# Patient Record
Sex: Female | Born: 1975 | Race: Black or African American | Hispanic: No | Marital: Married | Smoking: Former smoker
Health system: Southern US, Community
[De-identification: ages and names within clinical notes are randomized; demographics above are authoritative.]

## PROBLEM LIST (undated history)

## (undated) DIAGNOSIS — I10 Essential (primary) hypertension: Secondary | ICD-10-CM

## (undated) DIAGNOSIS — F419 Anxiety disorder, unspecified: Secondary | ICD-10-CM

## (undated) DIAGNOSIS — Z87442 Personal history of urinary calculi: Secondary | ICD-10-CM

## (undated) DIAGNOSIS — M109 Gout, unspecified: Secondary | ICD-10-CM

## (undated) DIAGNOSIS — F32A Depression, unspecified: Secondary | ICD-10-CM

## (undated) DIAGNOSIS — N200 Calculus of kidney: Secondary | ICD-10-CM

## (undated) DIAGNOSIS — G4726 Circadian rhythm sleep disorder, shift work type: Secondary | ICD-10-CM

## (undated) DIAGNOSIS — F329 Major depressive disorder, single episode, unspecified: Secondary | ICD-10-CM

## (undated) DIAGNOSIS — K219 Gastro-esophageal reflux disease without esophagitis: Secondary | ICD-10-CM

## (undated) DIAGNOSIS — R000 Tachycardia, unspecified: Secondary | ICD-10-CM

## (undated) DIAGNOSIS — E669 Obesity, unspecified: Secondary | ICD-10-CM

## (undated) DIAGNOSIS — R51 Headache: Secondary | ICD-10-CM

## (undated) DIAGNOSIS — E8881 Metabolic syndrome: Secondary | ICD-10-CM

## (undated) DIAGNOSIS — E119 Type 2 diabetes mellitus without complications: Secondary | ICD-10-CM

## (undated) DIAGNOSIS — F5104 Psychophysiologic insomnia: Secondary | ICD-10-CM

## (undated) DIAGNOSIS — M1990 Unspecified osteoarthritis, unspecified site: Secondary | ICD-10-CM

## (undated) DIAGNOSIS — I499 Cardiac arrhythmia, unspecified: Secondary | ICD-10-CM

## (undated) DIAGNOSIS — G459 Transient cerebral ischemic attack, unspecified: Secondary | ICD-10-CM

## (undated) HISTORY — DX: Metabolic syndrome: E88.81

## (undated) HISTORY — DX: Psychophysiologic insomnia: F51.04

## (undated) HISTORY — PX: INDUCED ABORTION: SHX677

## (undated) HISTORY — DX: Gout, unspecified: M10.9

## (undated) HISTORY — DX: Hypercalcemia: E83.52

## (undated) HISTORY — PX: DILATION AND CURETTAGE OF UTERUS: SHX78

## (undated) HISTORY — PX: HAND SURGERY: SHX662

## (undated) HISTORY — DX: Obesity, unspecified: E66.9

## (undated) HISTORY — DX: Metabolic syndrome: E88.810

## (undated) HISTORY — DX: Depression, unspecified: F32.A

## (undated) HISTORY — DX: Circadian rhythm sleep disorder, shift work type: G47.26

## (undated) HISTORY — DX: Major depressive disorder, single episode, unspecified: F32.9

## (undated) HISTORY — DX: Transient cerebral ischemic attack, unspecified: G45.9

---
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
## 1997-12-31 ENCOUNTER — Inpatient Hospital Stay (HOSPITAL_COMMUNITY): Admission: AD | Admit: 1997-12-31 | Discharge: 1997-12-31 | Payer: Self-pay | Admitting: Obstetrics

## 1998-02-27 ENCOUNTER — Emergency Department (HOSPITAL_COMMUNITY): Admission: EM | Admit: 1998-02-27 | Discharge: 1998-02-27 | Payer: Self-pay | Admitting: Emergency Medicine

## 1998-07-22 ENCOUNTER — Emergency Department (HOSPITAL_COMMUNITY): Admission: EM | Admit: 1998-07-22 | Discharge: 1998-07-22 | Payer: Self-pay

## 1998-12-12 ENCOUNTER — Emergency Department (HOSPITAL_COMMUNITY): Admission: EM | Admit: 1998-12-12 | Discharge: 1998-12-12 | Payer: Self-pay

## 1999-03-13 ENCOUNTER — Emergency Department (HOSPITAL_COMMUNITY): Admission: EM | Admit: 1999-03-13 | Discharge: 1999-03-13 | Payer: Self-pay | Admitting: Emergency Medicine

## 2000-10-18 ENCOUNTER — Emergency Department (HOSPITAL_COMMUNITY): Admission: EM | Admit: 2000-10-18 | Discharge: 2000-10-18 | Payer: Self-pay | Admitting: Emergency Medicine

## 2000-10-18 ENCOUNTER — Encounter: Payer: Self-pay | Admitting: Emergency Medicine

## 2000-10-20 ENCOUNTER — Other Ambulatory Visit: Admission: RE | Admit: 2000-10-20 | Discharge: 2000-10-20 | Payer: Self-pay | Admitting: Obstetrics

## 2000-10-21 ENCOUNTER — Emergency Department (HOSPITAL_COMMUNITY): Admission: EM | Admit: 2000-10-21 | Discharge: 2000-10-21 | Payer: Self-pay | Admitting: Emergency Medicine

## 2000-10-21 ENCOUNTER — Encounter: Payer: Self-pay | Admitting: Emergency Medicine

## 2001-06-01 HISTORY — PX: OTHER SURGICAL HISTORY: SHX169

## 2001-12-26 ENCOUNTER — Encounter: Payer: Self-pay | Admitting: Family Medicine

## 2001-12-26 ENCOUNTER — Ambulatory Visit (HOSPITAL_COMMUNITY): Admission: RE | Admit: 2001-12-26 | Discharge: 2001-12-26 | Payer: Self-pay | Admitting: Family Medicine

## 2002-09-15 ENCOUNTER — Other Ambulatory Visit: Admission: RE | Admit: 2002-09-15 | Discharge: 2002-09-15 | Payer: Self-pay | Admitting: Family Medicine

## 2003-11-08 ENCOUNTER — Other Ambulatory Visit: Admission: RE | Admit: 2003-11-08 | Discharge: 2003-11-08 | Payer: Self-pay | Admitting: Family Medicine

## 2004-01-18 ENCOUNTER — Emergency Department (HOSPITAL_COMMUNITY): Admission: EM | Admit: 2004-01-18 | Discharge: 2004-01-18 | Payer: Self-pay

## 2004-11-11 ENCOUNTER — Other Ambulatory Visit: Admission: RE | Admit: 2004-11-11 | Discharge: 2004-11-11 | Payer: Self-pay | Admitting: Family Medicine

## 2004-11-11 ENCOUNTER — Encounter: Admission: RE | Admit: 2004-11-11 | Discharge: 2004-11-11 | Payer: Self-pay | Admitting: Family Medicine

## 2005-01-13 ENCOUNTER — Emergency Department (HOSPITAL_COMMUNITY): Admission: EM | Admit: 2005-01-13 | Discharge: 2005-01-13 | Payer: Self-pay | Admitting: Family Medicine

## 2005-11-12 ENCOUNTER — Other Ambulatory Visit: Admission: RE | Admit: 2005-11-12 | Discharge: 2005-11-12 | Payer: Self-pay | Admitting: Family Medicine

## 2006-02-08 ENCOUNTER — Emergency Department (HOSPITAL_COMMUNITY): Admission: EM | Admit: 2006-02-08 | Discharge: 2006-02-08 | Payer: Self-pay | Admitting: Family Medicine

## 2006-08-03 ENCOUNTER — Ambulatory Visit: Payer: Self-pay | Admitting: Family Medicine

## 2006-10-04 ENCOUNTER — Encounter (INDEPENDENT_AMBULATORY_CARE_PROVIDER_SITE_OTHER): Payer: Self-pay | Admitting: Family Medicine

## 2006-10-04 ENCOUNTER — Ambulatory Visit: Payer: Self-pay | Admitting: Family Medicine

## 2006-10-05 ENCOUNTER — Ambulatory Visit: Payer: Self-pay | Admitting: *Deleted

## 2006-10-06 ENCOUNTER — Ambulatory Visit (HOSPITAL_COMMUNITY): Admission: RE | Admit: 2006-10-06 | Discharge: 2006-10-06 | Payer: Self-pay | Admitting: Family Medicine

## 2006-10-06 ENCOUNTER — Encounter: Payer: Self-pay | Admitting: Vascular Surgery

## 2006-10-07 ENCOUNTER — Ambulatory Visit: Payer: Self-pay | Admitting: Vascular Surgery

## 2007-01-03 ENCOUNTER — Ambulatory Visit: Payer: Self-pay | Admitting: Family Medicine

## 2007-01-03 LAB — CONVERTED CEMR LAB: Helicobacter Pylori Antibody-IgG: <0.4

## 2007-02-14 ENCOUNTER — Emergency Department (HOSPITAL_COMMUNITY): Admission: EM | Admit: 2007-02-14 | Discharge: 2007-02-14 | Payer: Self-pay | Admitting: Emergency Medicine

## 2007-03-14 ENCOUNTER — Ambulatory Visit: Payer: Self-pay | Admitting: Internal Medicine

## 2007-04-21 ENCOUNTER — Ambulatory Visit: Payer: Self-pay | Admitting: Internal Medicine

## 2007-05-19 ENCOUNTER — Ambulatory Visit: Payer: Self-pay | Admitting: Family Medicine

## 2007-08-24 ENCOUNTER — Other Ambulatory Visit: Admission: RE | Admit: 2007-08-24 | Discharge: 2007-08-24 | Payer: Self-pay | Admitting: Family Medicine

## 2008-04-27 ENCOUNTER — Emergency Department (HOSPITAL_COMMUNITY): Admission: EM | Admit: 2008-04-27 | Discharge: 2008-04-28 | Payer: Self-pay | Admitting: Emergency Medicine

## 2008-04-30 ENCOUNTER — Emergency Department (HOSPITAL_COMMUNITY): Admission: EM | Admit: 2008-04-30 | Discharge: 2008-05-01 | Payer: Self-pay | Admitting: *Deleted

## 2008-12-26 ENCOUNTER — Emergency Department: Payer: Self-pay | Admitting: Emergency Medicine

## 2009-01-19 ENCOUNTER — Emergency Department (HOSPITAL_COMMUNITY): Admission: EM | Admit: 2009-01-19 | Discharge: 2009-01-20 | Payer: Self-pay | Admitting: Emergency Medicine

## 2009-06-04 ENCOUNTER — Emergency Department: Payer: Self-pay | Admitting: Emergency Medicine

## 2009-06-11 ENCOUNTER — Ambulatory Visit: Payer: Self-pay | Admitting: Internal Medicine

## 2009-08-01 ENCOUNTER — Ambulatory Visit: Payer: Self-pay | Admitting: Family Medicine

## 2010-03-19 ENCOUNTER — Other Ambulatory Visit: Payer: Self-pay | Admitting: Internal Medicine

## 2010-03-19 ENCOUNTER — Other Ambulatory Visit: Payer: Self-pay | Admitting: Family Medicine

## 2010-04-02 ENCOUNTER — Emergency Department (HOSPITAL_COMMUNITY): Admission: EM | Admit: 2010-04-02 | Discharge: 2010-04-02 | Payer: Self-pay | Admitting: Family Medicine

## 2010-06-22 ENCOUNTER — Encounter: Payer: Self-pay | Admitting: Family Medicine

## 2010-07-29 ENCOUNTER — Inpatient Hospital Stay (INDEPENDENT_AMBULATORY_CARE_PROVIDER_SITE_OTHER)
Admission: RE | Admit: 2010-07-29 | Discharge: 2010-07-29 | Disposition: A | Discharge status: Home / Self Care | Payer: No Typology Code available for payment source | Source: Ambulatory Visit | Attending: Family Medicine | Admitting: Family Medicine

## 2010-07-29 DIAGNOSIS — S5010XA Contusion of unspecified forearm, initial encounter: Secondary | ICD-10-CM

## 2010-08-29 ENCOUNTER — Encounter (INDEPENDENT_AMBULATORY_CARE_PROVIDER_SITE_OTHER): Payer: Self-pay | Admitting: *Deleted

## 2010-09-02 NOTE — Letter (Signed)
Summary: New Patient letter  [facility]  [address]   [place], [place] [address]   Phone: [phone]  Fax: [phone]       08/29/2010 MRN: [id number]  [facility] BELL [address] [place], [place]  [address]  Dear Ms. BELL,  Welcome to the Gastroenterology Division at [facility].    You are scheduled to see Dr.  Christella Hartigan on 10/07/2010 at 3:00 on the 3rd floor at [facility], [address]  We ask that you try to arrive at our office 15 minutes prior to your appointment time to allow for check-in.  We would like you to complete the enclosed self-administered evaluation form prior to your visit and bring it with you on the day of your appointment.  We will review it with you.  Also, please bring a complete list of all your medications or, if you prefer, bring the medication bottles and we will list them.  Please bring your insurance card so that we may make a copy of it.  If your insurance requires a referral to see a specialist, please bring your referral form from your primary care physician.  Co-payments are due at the time of your visit and may be paid by cash, check or credit card.     Your office visit will consist of a consult with your physician (includes a physical exam), any laboratory testing he/she may order, scheduling of any necessary diagnostic testing (e.g. x-ray, ultrasound, CT-scan), and scheduling of a procedure (e.g. Endoscopy, Colonoscopy) if required.  Please allow enough time on your schedule to allow for any/all of these possibilities.    If you cannot keep your appointment, please call [phone] to cancel or reschedule prior to your appointment date.  This allows [place] the opportunity to schedule an appointment for another patient in need of care.  If you do not cancel or reschedule by 5 p.m. the business day prior to your appointment date, you will be charged a $50.00 late cancellation/no-show fee.    Thank you for choosing [facility] for your medical needs.  We appreciate the opportunity to care for you.  Please visit [place] at our website  to learn more about our practice.                     Sincerely,                                                             The Gastroenterology Division

## 2010-09-06 LAB — URINALYSIS, ROUTINE W REFLEX MICROSCOPIC
Bilirubin Urine: NEGATIVE
Hgb urine dipstick: NEGATIVE
Ketones, ur: 15 mg/dL — AB
Specific Gravity, Urine: 1.026 (ref 1.005–1.030)

## 2010-09-06 LAB — HEPATIC FUNCTION PANEL
ALT: 13 U/L (ref 0–35)
AST: 14 U/L (ref 0–37)
Albumin: 4 g/dL (ref 3.5–5.2)
Alkaline Phosphatase: 48 U/L (ref 39–117)
Bilirubin, Direct: 0.1 mg/dL (ref 0.0–0.3)
Total Bilirubin: 1 mg/dL (ref 0.3–1.2)

## 2010-09-06 LAB — POCT I-STAT, CHEM 8
Chloride: 108 mEq/L (ref 96–112)
Creatinine, Ser: 1 mg/dL (ref 0.4–1.2)
Potassium: 3.6 mEq/L (ref 3.5–5.1)
TCO2: 24 mmol/L (ref 0–100)

## 2010-09-06 LAB — DIFFERENTIAL
Basophils Absolute: 0.2 10*3/uL — ABNORMAL HIGH (ref 0.0–0.1)
Basophils Relative: 3 % — ABNORMAL HIGH (ref 0–1)
Eosinophils Absolute: 0.2 10*3/uL (ref 0.0–0.7)
Eosinophils Relative: 3 % (ref 0–5)
Lymphocytes Relative: 32 % (ref 12–46)
Lymphs Abs: 2.3 10*3/uL (ref 0.7–4.0)
Monocytes Absolute: 0.3 10*3/uL (ref 0.1–1.0)
Monocytes Relative: 5 % (ref 3–12)
Neutrophils Relative %: 57 % (ref 43–77)

## 2010-09-06 LAB — LIPASE, BLOOD: Lipase: 22 U/L (ref 11–59)

## 2010-09-06 LAB — CBC
MCV: 89.2 fL (ref 78.0–100.0)
RDW: 13.1 % (ref 11.5–15.5)

## 2010-09-06 LAB — POCT PREGNANCY, URINE: Preg Test, Ur: NEGATIVE

## 2010-10-03 ENCOUNTER — Ambulatory Visit: Payer: No Typology Code available for payment source | Admitting: Gynecology

## 2010-10-07 ENCOUNTER — Ambulatory Visit: Payer: No Typology Code available for payment source | Admitting: Gastroenterology

## 2010-10-21 ENCOUNTER — Encounter: Payer: Self-pay | Admitting: Physician Assistant

## 2010-10-31 ENCOUNTER — Encounter: Payer: Self-pay | Admitting: Physician Assistant

## 2010-11-20 ENCOUNTER — Emergency Department: Payer: Self-pay | Admitting: Emergency Medicine

## 2010-11-30 ENCOUNTER — Encounter: Payer: Self-pay | Admitting: Physician Assistant

## 2011-03-03 LAB — POCT CARDIAC MARKERS
Myoglobin, poc: 37.8 ng/mL (ref 12–200)
Troponin i, poc: <0.05 ng/mL (ref 0.00–0.09)

## 2011-03-03 LAB — CBC
MCHC: 33.3 g/dL (ref 30.0–36.0)
MCV: 89.4 fL (ref 78.0–100.0)
Platelets: 285 10*3/uL (ref 150–400)
RBC: 5.1 MIL/uL (ref 3.87–5.11)
RDW: 13.5 % (ref 11.5–15.5)

## 2011-03-03 LAB — POCT I-STAT, CHEM 8
Glucose, Bld: 91 mg/dL (ref 70–99)
HCT: 46 % (ref 36.0–46.0)
Hemoglobin: 15.6 g/dL — ABNORMAL HIGH (ref 12.0–15.0)
Sodium: 141 mEq/L (ref 135–145)
TCO2: 24 mmol/L (ref 0–100)

## 2011-03-03 LAB — DIFFERENTIAL
Basophils Absolute: 0.1 10*3/uL (ref 0.0–0.1)
Eosinophils Absolute: 0.3 10*3/uL (ref 0.0–0.7)
Eosinophils Relative: 3 % (ref 0–5)
Lymphocytes Relative: 41 % (ref 12–46)
Monocytes Absolute: 0.4 10*3/uL (ref 0.1–1.0)
Neutro Abs: 4.7 10*3/uL (ref 1.7–7.7)
Neutrophils Relative %: 51 % (ref 43–77)

## 2011-03-03 LAB — URINALYSIS, ROUTINE W REFLEX MICROSCOPIC: Bilirubin Urine: NEGATIVE

## 2011-03-03 LAB — BASIC METABOLIC PANEL
BUN: 9 mg/dL (ref 6–23)
Calcium: 9.8 mg/dL (ref 8.4–10.5)
GFR calc non Af Amer: >60 mL/min (ref >60)

## 2011-03-03 LAB — PROTIME-INR: Prothrombin Time: 13.1 seconds (ref 11.6–15.2)

## 2011-03-03 LAB — POCT PREGNANCY, URINE: Preg Test, Ur: NEGATIVE

## 2011-03-05 LAB — POCT CARDIAC MARKERS
Myoglobin, poc: 44.7 ng/mL (ref 12–200)
Troponin i, poc: <0.05 ng/mL (ref 0.00–0.09)
Troponin i, poc: <0.05 ng/mL (ref 0.00–0.09)

## 2011-03-12 LAB — CBC
HCT: 44.4
Hemoglobin: 14.9
MCV: 88
WBC: 7.9

## 2011-03-12 LAB — URINALYSIS, ROUTINE W REFLEX MICROSCOPIC
Bilirubin Urine: NEGATIVE
Glucose, UA: NEGATIVE
Specific Gravity, Urine: 1.012
pH: 6

## 2011-03-12 LAB — DIFFERENTIAL
Eosinophils Absolute: 0.3
Eosinophils Relative: 4
Lymphocytes Relative: 36
Lymphs Abs: 2.9
Monocytes Absolute: 0.4

## 2011-03-12 LAB — URINE MICROSCOPIC-ADD ON

## 2011-05-21 ENCOUNTER — Inpatient Hospital Stay (HOSPITAL_COMMUNITY)
Admission: AD | Admit: 2011-05-21 | Discharge: 2011-05-21 | Disposition: A | Discharge status: Home / Self Care | Payer: Federal, State, Local not specified - PPO | Source: Ambulatory Visit | Attending: Obstetrics & Gynecology | Admitting: Obstetrics & Gynecology

## 2011-05-21 ENCOUNTER — Inpatient Hospital Stay (HOSPITAL_COMMUNITY): Payer: Federal, State, Local not specified - PPO

## 2011-05-21 ENCOUNTER — Encounter (HOSPITAL_COMMUNITY): Payer: Self-pay | Admitting: *Deleted

## 2011-05-21 DIAGNOSIS — N938 Other specified abnormal uterine and vaginal bleeding: Secondary | ICD-10-CM | POA: Insufficient documentation

## 2011-05-21 DIAGNOSIS — R102 Pelvic and perineal pain: Secondary | ICD-10-CM

## 2011-05-21 DIAGNOSIS — Z30432 Encounter for removal of intrauterine contraceptive device: Secondary | ICD-10-CM | POA: Insufficient documentation

## 2011-05-21 DIAGNOSIS — N925 Other specified irregular menstruation: Secondary | ICD-10-CM | POA: Insufficient documentation

## 2011-05-21 DIAGNOSIS — N939 Abnormal uterine and vaginal bleeding, unspecified: Secondary | ICD-10-CM

## 2011-05-21 DIAGNOSIS — N898 Other specified noninflammatory disorders of vagina: Secondary | ICD-10-CM

## 2011-05-21 DIAGNOSIS — N949 Unspecified condition associated with female genital organs and menstrual cycle: Secondary | ICD-10-CM | POA: Insufficient documentation

## 2011-05-21 HISTORY — DX: Cardiac arrhythmia, unspecified: I49.9

## 2011-05-21 HISTORY — DX: Essential (primary) hypertension: I10

## 2011-05-21 LAB — URINALYSIS, ROUTINE W REFLEX MICROSCOPIC
Ketones, ur: NEGATIVE mg/dL
Nitrite: NEGATIVE
pH: 6 (ref 5.0–8.0)

## 2011-05-21 LAB — CBC
MCH: 29.2 pg (ref 26.0–34.0)
Platelets: 267 10*3/uL (ref 150–400)
RBC: 4.83 MIL/uL (ref 3.87–5.11)
WBC: 7.4 10*3/uL (ref 4.0–10.5)

## 2011-05-21 MED ORDER — KETOROLAC TROMETHAMINE 60 MG/2ML IM SOLN
60.0000 mg | Freq: Once | INTRAMUSCULAR | Status: AC
  Administered 2011-05-21: 60 mg via INTRAMUSCULAR
  Filled 2011-05-21: qty 2

## 2011-05-21 NOTE — ED Provider Notes (Signed)
History   Pt presents today c/o constant pelvic pain and irregular bleeding since having her IUD placed by [facility] in August of this year. She states she has a hx of endometriosis diagnosed by laparoscopy when she was 35yo. She was on Depo provera for many years which controlled her sx but she dc'd the Depo provera when she was found to be osteopenic. She states she had immediate pain with placement of the IUD which has continued to this day. At times, the pain "takes her breath away." She denies vag irritation, vag dc, fever, or any other sx at this time.  Chief Complaint  Patient presents with  . Vaginal Bleeding  . Abdominal Pain   HPI  OB History    Grav Para Term Preterm Abortions TAB SAB Ect Mult Living   6 2 2  0 4 2 2  0 0 2      Past Medical History  Diagnosis Date  . Endometriosis   . Hypertension   . Dysrhythmia     Past Surgical History  Procedure Date  . Metatarsil      Family History  Problem Relation Age of Onset  . Diabetes Mother   . Hypertension Mother   . Hypertension Father   . Kidney disease Father     History  Substance Use Topics  . Smoking status: Current Some Day Smoker -- 0.5 packs/day  . Smokeless tobacco: Not on file  . Alcohol Use: No    Allergies: No Known Allergies  Prescriptions prior to admission  Medication Sig Dispense Refill  . acetaminophen (TYLENOL) 500 MG tablet Take 500 mg by mouth every 6 (six) hours as needed. Stomach cramps       . cyclobenzaprine (FLEXERIL) 5 MG tablet Take 5 mg by mouth 3 (three) times daily as needed.        Marland Kitchen ibuprofen (ADVIL,MOTRIN) 800 MG tablet Take 800 mg by mouth every 8 (eight) hours as needed. Cramping in stomach       . metoprolol (TOPROL-XL) 50 MG 24 hr tablet Take 50 mg by mouth daily.          Review of Systems  Constitutional: Negative for fever.  Eyes: Negative for blurred vision.  Cardiovascular: Negative for chest pain and palpitations.  Gastrointestinal: Positive for  abdominal pain. Negative for nausea, vomiting, diarrhea and constipation.  Genitourinary: Negative for dysuria, urgency, frequency and hematuria.  Neurological: Negative for dizziness and headaches.  Psychiatric/Behavioral: Negative for depression and suicidal ideas.   Physical Exam   Blood pressure 128/81, pulse 78, temperature 99.4 F (37.4 C), temperature source Oral, resp. rate 20, height 5' 5.5" (1.664 m), weight 206 lb (93.441 kg), SpO2 99.00%.  Physical Exam  Nursing note and vitals reviewed. Constitutional: She is oriented to person, place, and time. She appears well-developed and well-nourished. No distress.  HENT:  Head: Normocephalic and atraumatic.  Eyes: EOM are normal. Pupils are equal, round, and reactive to light.  GI: Soft. She exhibits no distension and no mass. There is tenderness. There is no rebound and no guarding.  Genitourinary: There is bleeding around the vagina. No vaginal discharge found.       IUD strings easily visualized. Bimanual exam reveals uterus to be top-normal size with no adnexal masses noted. Pt tender over uterus.  Neurological: She is alert and oriented to person, place, and time.  Skin: Skin is warm and dry. She is not diaphoretic.  Psychiatric: She has a normal mood and affect. Her behavior is  normal. Judgment and thought content normal.    MAU Course  Procedures  GC/Chlamydia cultures sent.  Results for orders placed during the hospital encounter of 05/21/11 (from the past 24 hour(s))  URINALYSIS, ROUTINE W REFLEX MICROSCOPIC     Status: Abnormal   Collection Time   05/21/11  3:00 PM      Component Value Range   Color, Urine YELLOW  YELLOW    APPearance CLEAR  CLEAR    Specific Gravity, Urine >1.030 (*) 1.005 - 1.030    pH 6.0  5.0 - 8.0    Glucose, UA NEGATIVE  NEGATIVE (mg/dL)   Hgb urine dipstick MODERATE (*) NEGATIVE    Bilirubin Urine NEGATIVE  NEGATIVE    Ketones, ur NEGATIVE  NEGATIVE (mg/dL)   Protein, ur NEGATIVE   NEGATIVE (mg/dL)   Urobilinogen, UA 0.2  0.0 - 1.0 (mg/dL)   Nitrite NEGATIVE  NEGATIVE    Leukocytes, UA TRACE (*) NEGATIVE   URINE MICROSCOPIC-ADD ON     Status: Normal   Collection Time   05/21/11  3:00 PM      Component Value Range   Squamous Epithelial / LPF RARE  RARE   POCT PREGNANCY, URINE     Status: Normal   Collection Time   05/21/11  3:07 PM      Component Value Range   Preg Test, Ur NEGATIVE    CBC     Status: Normal   Collection Time   05/21/11  3:09 PM      Component Value Range   WBC 7.4  4.0 - 10.5 (K/uL)   RBC 4.83  3.87 - 5.11 (MIL/uL)   Hemoglobin 14.1  12.0 - 15.0 (g/dL)   HCT 09.8  11.9 - 14.7 (%)   MCV 87.8  78.0 - 100.0 (fL)   MCH 29.2  26.0 - 34.0 (pg)   MCHC 33.3  30.0 - 36.0 (g/dL)   RDW 82.9  56.2 - 13.0 (%)   Platelets 267  150 - 400 (K/uL)   US Transvaginal Non-ob  05/21/2011  *RADIOLOGY REPORT*  Clinical Data: Pain, IUD.  TRANSABDOMINAL AND TRANSVAGINAL ULTRASOUND OF PELVIS  Technique:  Both transabdominal and transvaginal ultrasound examinations of the pelvis were performed including evaluation of the uterus, ovaries, adnexal regions, and pelvic cul-de-sac.  Comparison: CT 01/20/2009  Findings:  Uterus: 8.5 x 4.3 x 5.1 cm.  Normal echotexture.  No focal abnormality.  Endometrium: Normal appearance and thickness, 4 mm.  IUD in place.  Right Ovary: 3.0 x 1.0 x 2.0 cm. Normal size and echotexture.  No adnexal masses.  Left Ovary: 2.5 x 1.9 x 2.1 cm. Normal size and echotexture.  No adnexal masses.  Other Findings:  Trace free fluid.  IMPRESSION: IUD visualized within the endometrium.  No acute findings.  Original Report Authenticated By: Cyndie Chime, M.D.   US Pelvis Complete  05/21/2011  *RADIOLOGY REPORT*  Clinical Data: Pain, IUD.  TRANSABDOMINAL AND TRANSVAGINAL ULTRASOUND OF PELVIS  Technique:  Both transabdominal and transvaginal ultrasound examinations of the pelvis were performed including evaluation of the uterus, ovaries, adnexal regions,  and pelvic cul-de-sac.  Comparison: CT 01/20/2009  Findings:  Uterus: 8.5 x 4.3 x 5.1 cm.  Normal echotexture.  No focal abnormality.  Endometrium: Normal appearance and thickness, 4 mm.  IUD in place.  Right Ovary: 3.0 x 1.0 x 2.0 cm. Normal size and echotexture.  No adnexal masses.  Left Ovary: 2.5 x 1.9 x 2.1 cm. Normal size and echotexture.  No  adnexal masses.  Other Findings:  Trace free fluid.  IMPRESSION: IUD visualized within the endometrium.  No acute findings.  Original Report Authenticated By: Cyndie Chime, M.D.    Discussed [place] report with pt and labs. At this time she has decided to remove the IUD. IUD was easily removed with ring forceps. Pt tolerated this procedure well Assessment and Plan  Pelvic pain with irregular vag bleeding: hopefully the pt sx will completely resolve following removal of the IUD. She will use OTC motrin for pain. Will have her f/u in the GYN clinic. Discussed diet, activity, risks, and precautions.  Clinton Gallant. Hermelinda Diegel III, DrHSc, MPAS, PA-C  05/21/2011, 4:08 PM   Henrietta Hoover, PA 05/21/11 1723

## 2011-05-21 NOTE — Progress Notes (Addendum)
Had mirena put in Aug, pure hell since.  Pain is almost like labor, really bad past 8wks.  Bleeding for past 8 wks.  Has hx of endometriosis.  Felt pressure at one point like it was going to be expelled. Can still feel strings.  Had vasal vagal response during placement.  (at [facility]).

## 2011-05-21 NOTE — Discharge Instructions (Signed)

## 2011-05-21 NOTE — Progress Notes (Signed)
Stacey Stanley IUD placed in August , but the last 8wks pt states she has had a lot of pain that almost knocks her over.Pt states pain in on both sides of pelvis, but some times over the right side. Pt states she has had a lot of bleeding with a lot of blood clots at times.

## 2011-05-22 LAB — GC/CHLAMYDIA PROBE AMP, URINE
Chlamydia, Swab/Urine, PCR: NEGATIVE
GC Probe Amp, Urine: NEGATIVE

## 2011-05-26 NOTE — ED Provider Notes (Signed)
Agree with above note.  Zohair Epp H. 05/26/2011 2:17 AM

## 2011-06-06 ENCOUNTER — Encounter (HOSPITAL_COMMUNITY): Payer: Self-pay | Admitting: *Deleted

## 2011-06-06 ENCOUNTER — Inpatient Hospital Stay (HOSPITAL_COMMUNITY)
Admission: AD | Admit: 2011-06-06 | Discharge: 2011-06-06 | Disposition: A | Discharge status: Home / Self Care | Payer: Federal, State, Local not specified - PPO | Source: Ambulatory Visit | Attending: Family Medicine | Admitting: Family Medicine

## 2011-06-06 DIAGNOSIS — N949 Unspecified condition associated with female genital organs and menstrual cycle: Secondary | ICD-10-CM | POA: Insufficient documentation

## 2011-06-06 DIAGNOSIS — N938 Other specified abnormal uterine and vaginal bleeding: Secondary | ICD-10-CM | POA: Insufficient documentation

## 2011-06-06 DIAGNOSIS — N926 Irregular menstruation, unspecified: Secondary | ICD-10-CM

## 2011-06-06 DIAGNOSIS — N939 Abnormal uterine and vaginal bleeding, unspecified: Secondary | ICD-10-CM

## 2011-06-06 DIAGNOSIS — N925 Other specified irregular menstruation: Secondary | ICD-10-CM | POA: Insufficient documentation

## 2011-06-06 LAB — CBC
HCT: 38.8 % (ref 36.0–46.0)
Hemoglobin: 13.1 g/dL (ref 12.0–15.0)
MCH: 29.4 pg (ref 26.0–34.0)
MCHC: 33.8 g/dL (ref 30.0–36.0)
Platelets: 256 10*3/uL (ref 150–400)
RDW: 13.4 % (ref 11.5–15.5)

## 2011-06-06 MED ORDER — MEDROXYPROGESTERONE ACETATE 10 MG PO TABS: 10.0000 mg | ORAL_TABLET | Freq: Every day | ORAL | Status: DC

## 2011-06-06 NOTE — Progress Notes (Signed)
Pt had Mirena taken out on 12/20 in MAU for vag bleeding. Pt reports vaginal bleeding has gotten heavy the last week passing golf ball sized clots and felt weak like she was going to pass out.

## 2011-06-06 NOTE — ED Notes (Signed)
D. Poe CNM at bedside  

## 2011-06-06 NOTE — ED Provider Notes (Signed)
Stacey Stanley [id number] @Unknown  Chief Complaint  Patient presents with  . Vaginal Bleeding    SUBJECTIVE  HPI: Since she was seen here 05/21/2011 and had her Mirena IUD removed she reports having continuous heavy bleeding at times saturating one pad an hour and intermittently having golfball size clots. She sometimes feels dizzy when walking. Also having abdominal cramping. At her last visit to MAU she was evaluated for irregular bleeding which began IUD insertion in August of 2012. Prior to that she had been on Depo for several years but DC'd that due to osteopenia. She had a negative pregnancy test 05/21/2011. GC chlamydia were negative. Pelvic ultrasound was normal. She's not using any contraception now but is not sexually active. She has made an appointment with Dr. Cherly Hensen for later this month. Recently started on Toprol for HTN.  Past Medical History  Diagnosis Date  . Endometriosis   . Hypertension   . Dysrhythmia    Past Surgical History  Procedure Date  . Metatarsil    . Laparoscopy   . Dilation and curettage of uterus   . Induced abortion    History   Social History  . Marital Status: Single    Spouse Name: N/A    Number of Children: N/A  . Years of Education: N/A   Occupational History  . Not on file.   Social History Main Topics  . Smoking status: Current Some Day Smoker -- 0.5 packs/day  . Smokeless tobacco: Never Used  . Alcohol Use: No  . Drug Use: No  . Sexually Active: Yes   Other Topics Concern  . Not on file   Social History Narrative  . No narrative on file   No current facility-administered medications on file prior to encounter.   Current Outpatient Prescriptions on File Prior to Encounter  Medication Sig Dispense Refill  . acetaminophen (TYLENOL) 500 MG tablet Take 500 mg by mouth every 6 (six) hours as needed. Stomach cramps       . cyclobenzaprine (FLEXERIL) 5 MG tablet Take 5 mg by mouth 3 (three) times daily as needed.        Marland Kitchen  ibuprofen (ADVIL,MOTRIN) 800 MG tablet Take 800 mg by mouth every 8 (eight) hours as needed. Cramping in stomach       . metoprolol (TOPROL-XL) 50 MG 24 hr tablet Take 50 mg by mouth daily.         No Known Allergies  ROS: Pertinent items in HPI  OBJECTIVE  BP 129/91  Pulse 94  Temp(Src) 98.9 F (37.2 C) (Oral)  Resp 18  Ht 5\' 5"  (1.651 m)  Wt 92.715 kg (204 lb 6.4 oz)  BMI 34.01 kg/m2  SpO2 97%  LMP 04/26/2011  Physical Exam  Constitutional: She is well-developed, well-nourished, and in no distress.  HENT:  Head: Normocephalic.  Eyes: EOM are normal.  Neck: Neck supple.  Cardiovascular: Normal rate.   Pulmonary/Chest: Effort normal.  Abdominal: Soft. There is no tenderness.  Genitourinary: Vagina normal, uterus normal, cervix normal, right adnexa normal and left adnexa normal. No vaginal discharge found.       Mod amt vag blood swabbed from vault. Cx parous,  not well visualized 2 -3 mm firm nodule palpated on left ant cx lip, probably a Nabothian cyst Uterus NSSP Adnexae w/o tenderness or mass   CBC   Collection Time   06/06/11  5:49 PM      Component Value Range   WBC 7.3  4.0 - 10.5 (K/uL)  RBC 4.45  3.87 - 5.11 (MIL/uL)   Hemoglobin 13.1  12.0 - 15.0 (g/dL)   HCT 16.1  09.6 - 04.5 (%)   MCV 87.2  78.0 - 100.0 (fL)   MCH 29.4  26.0 - 34.0 (pg)   MCHC 33.8  30.0 - 36.0 (g/dL)   RDW 40.9  81.1 - 91.4 (%)   Platelets 256  150 - 400 (K/uL)   05/21/11 had normal pelvic US, neg UPT  ASSESSMENT Still bleeding 2 wks after Mirena removal   PLAN Reassured. Keep appointment Dr. Cherly Hensen this month. Continue abstinence until then.

## 2011-06-22 NOTE — ED Provider Notes (Signed)
Chart reviewed and agree with management and plan.  

## 2011-08-23 ENCOUNTER — Encounter (HOSPITAL_COMMUNITY): Payer: Self-pay | Admitting: Emergency Medicine

## 2011-08-23 ENCOUNTER — Emergency Department (INDEPENDENT_AMBULATORY_CARE_PROVIDER_SITE_OTHER)
Admission: EM | Admit: 2011-08-23 | Discharge: 2011-08-23 | Disposition: A | Discharge status: Home / Self Care | Payer: Federal, State, Local not specified - PPO | Source: Home / Self Care | Attending: Emergency Medicine | Admitting: Emergency Medicine

## 2011-08-23 DIAGNOSIS — J019 Acute sinusitis, unspecified: Secondary | ICD-10-CM

## 2011-08-23 DIAGNOSIS — J209 Acute bronchitis, unspecified: Secondary | ICD-10-CM

## 2011-08-23 DIAGNOSIS — J069 Acute upper respiratory infection, unspecified: Secondary | ICD-10-CM

## 2011-08-23 HISTORY — DX: Tachycardia, unspecified: R00.0

## 2011-08-23 MED ORDER — BENZONATATE 200 MG PO CAPS: 200.0000 mg | ORAL_CAPSULE | Freq: Three times a day (TID) | ORAL | Status: AC | PRN

## 2011-08-23 MED ORDER — AMOXICILLIN 500 MG PO CAPS: 1000.0000 mg | ORAL_CAPSULE | Freq: Three times a day (TID) | ORAL | Status: AC

## 2011-08-23 NOTE — ED Provider Notes (Signed)
Chief Complaint  Patient presents with  . Cough    History of Present Illness:   The patient is a 36 year old female with a one-week history of a dry, barky cough, chest tightness, shortness of breath, but no wheezing, nasal congestion with yellow drainage, ear fullness, sinus pressure, sore throat, and hoarseness. She denies fever, chills, sweats, or GI complaints. 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  Review of Systems:  Other than noted above, the patient denies any of the following symptoms. Systemic:  No fever, chills, sweats, fatigue, myalgias, headache, or anorexia. Eye:  No redness, pain or drainage. ENT:  No earache, nasal congestion, rhinorrhea, sinus pressure, or sore throat. Lungs:  No cough, sputum production, wheezing, shortness of breath. Or chest pain. GI:  No nausea, vomiting, abdominal pain or diarrhea. Skin:  No rash or itching.  PMFSH:  Past medical history, family history, social history, meds, and allergies were reviewed.  Physical Exam:   Vital signs:  BP 103/68  Pulse 93  Temp(Src) 98.2 F (36.8 C) (Oral)  Resp 16  SpO2 98%  LMP 07/31/2011 General:  Alert, in no distress. Eye:  No conjunctival injection or drainage. ENT:  TMs and canals were normal, without erythema or inflammation.  Nasal mucosa was congested with copious clear to yellow-green drainage.  Mucous membranes were moist.  Pharynx was clear, without exudate or drainage.  There were no oral ulcerations or lesions. Neck:  Supple, no adenopathy, tenderness or mass. Lungs:  No respiratory distress.  Lungs were clear to auscultation, without wheezes, rales or rhonchi.  Breath sounds were clear and equal bilaterally. Heart:  Regular rhythm, without gallops, murmers or rubs. Skin:  Clear, warm, and dry, without rash or lesions.  Assessment:   Diagnoses that have been ruled out:  None  Diagnoses that are still under  consideration:  None  Final diagnoses:  Viral upper respiratory infection  Acute sinusitis  Acute bronchitis      Plan:   1.  The following meds were prescribed:   New Prescriptions   AMOXICILLIN (AMOXIL) 500 MG CAPSULE    Take 2 capsules (1,000 mg total) by mouth 3 (three) times daily.   BENZONATATE (TESSALON) 200 MG CAPSULE    Take 1 capsule (200 mg total) by mouth 3 (three) times daily as needed for cough.   2.  The patient was instructed in symptomatic care and handouts were given. 3.  The patient was told to return if becoming worse in any way, if no better in 3 or 4 days, and given some red flag symptoms that would indicate earlier return.   Reuben Likes, MD 08/23/11 [phone]

## 2011-08-23 NOTE — Discharge Instructions (Signed)
Most upper respiratory infections are caused by viruses and do not require antibiotics.  We try to save the antibiotics for when we really need them to avoid resistance.  This does not mean that there is nothing that can be done.  Here are a few hints about things that can be done at home to get over an upper respiratory infection quicker:  Get extra sleep and extra fluids.  Get 7 to 9 hours of sleep per night and 6 to 8 glasses of water a day.  Getting extra sleep keeps the immune system from getting run down.  Most people with an upper respiratory infection are a little dehydrated.  The extra fluids also keep the secretions liquified and easier to deal with.  Also, get extra vitamin C.  4000 mg per day is the recommended dose. For the aches, headache, and fever, acetaminophen or ibuprofen are helpful.  These can be alternated every 4 hours.  People with liver disease should avoid large amounts of acetaminophen, and people with ulcer disease, gastroesophageal reflux, gastritis, congestive heart failure, chronic kidney disease, coronary artery disease and the elderly should avoid ibuprofen. For nasal congestion try Mucinex-D, or if you're having lots of sneezing or copious clear nasal drainage Allegra-D-24 hour.  A Saline nasal spray such as Ocean Spray can also help as can decongestant sprays such as Afrin, but you should not use the decongestant sprays for more than 3 or 4 days since they can be habituating.  If nasal dryness is a problem, Ayr Nasal Gel can help moisturize your nasal passages.  Breath Rite nasal strips can also offer a non-drug alternative treatment to nasal congestion, especially at night. For people with symptoms of sinusitis, sleeping with your head elevated can be helpful.  For sinus pain, moist, hot compresses to the face may provide some relief.  Many people find that inhaling steam as in a shower or from a pot of steaming water can help. For sore throat, zinc containing lozenges such  as Cold-Eze or Zicam are helpful.  Zinc helps to fight infection and has a mild astringent effect that relieves the sore, achey throat.  Hot salt water gargles (8 oz of hot water, 1/2 tsp of table salt, and a pinch of baking soda) can give relief as well as hot beverages such as hot tea. For the cough, old time remedies such as honey or honey and lemon are tried and true.  Over the counter cough syrups such as Delsym 2 tsp every 12 hours can help as well.  It's important when you have an upper respiratory infection not to pass the infection to others.  This involves being very careful about the following:  Frequent hand washing or use of hand sanitizer, especially after coughing, sneezing, blowing your nose or touching your face, nose or eyes. Do not shake hands or touch anyone and try to avoid touching surfaces that other people use such as doorknobs, shopping carts, telephones and computer keyboards. Use tissues and dispose of them properly in a garbage can or ziplock bag. Cough into your sleeve. Do not let others eat or drink after you.  It's also important to recognize the signs of serious illness and get evaluated if they occur: Any respiratory infection that lasts more than 7 to 10 days.  Yellow nasal drainage and sputum are not reliable indicators of a bacterial infection, but if they last for more than 1 week, see your doctor. Fever and sore throat can indicate strep. Fever   and cough can indicate influenza or pneumonia. Any kind of severe symptom such as difficulty breathing, intractable vomiting, or severe pain should prompt you to see a doctor as soon as possible.   Your body's immune system is really the thing that will get rid of this infection.  Your immune system is comprised of 2 types of specialized cells called T cells and B cells.  T cells coordinate the array of cells in your body that engulf invading bacteria or viruses while B cells orchestrate the production of antibodies that  neutralize infection.  Anything we do or any medications we give you, will just strengthen your immune system or help it clear up the infection quicker.  Here are a few helpful hints to improve your immune system to help overcome this illness or to prevent future infections:  A few vitamins can improve the health of your immune system.  That's why your diet should include plenty of fruits, vegetables, fish, nuts, and whole grains.  Vitamin A and bet-carotene can increase the cells that fight infections (T cells and B cells).  Vitamin A is abundant in dark greens and orange vegetables such as spinach, greens, sweet potatoes, and carrots.  Vitamin B6 contributes to the maturation of white blood cells, the cells that fight disease.  Foods with vitamin B6 include cold cereal and bananas.  Vitamin C is credited with preventing colds because it increases white blood cells and also prevents cellular damage.  Citrus fruits, peaches and green and red bell peppers are all hight in vitamin C.  Vitamin E is an anti-oxidant that encourages the production of natural killer cells which reject foreign invaders and B cells that produce antibodies.  Foods high in vitamin E include wheat germ, nuts and seeds.  Foods high in omega-3 fatty acids found in foods like salmon, tuna and mackerel boost your immune system and help cells to engulf and absorb germs.  Probiotics are good bacteria that increase your T cells.  These can be found in yogurt and are available in supplements such as Culturelle or Align.  Moderate exercise increases the strength of your immune system and your ability to recover from illness.  I suggest 3 to 5 moderate intensity 30 minute workouts per week.    Sleep is another component of maintaining a strong immune system.  It enables your body to recuperate from the day's activities, stress and work.  My recommendation is to get between 7 and 9 hours of sleep per night.  If you smoke, try to quit  completely or at least cut down.  Drink alcohol only in moderation if at all.  No more than 2 drinks daily for men or 1 for women.  Get a flu vaccine early in the fall or if you have not gotten one yet, once this illness has run its course.  If you are over 65, a smoker, or an asthmatic, get a pneumococcal vaccine.  My final recommendation is to maintain a healthy weight.  Excess weight can impair the immune system by interfering with the way the immune system deals with invading viruses or bacteria.   How to Quit Smoking  According to the U.S. Surgeon General, about 440,000 people in the [place] alone die from complications related to tobacco use.  More deaths occur due to cigarette smoking than illegal drug use, AIDS, car accidents, alcohol-related deaths, suicide and homicide combined.  Smoking accounts for about 30% of all cancer related deaths, including more than 80%   of lung cancer deaths. Smoking has also been linked as the cause of many other diseases like heart disease, bronchitis, emphysema, stroke, and complications of pneumonia as well as causing an increased risk of miscarriage, premature births, stillbirth, infant death, and low birth weight in infants.  For this reason, the [place]. Surgeon General recommends:  "Smoking cessation (stopping smoking) represents the single most important step that smokers can take to enhance the length and quality of their lives."  Why is it so hard to Quit?  Tobacco products contain Nicotine which is highly addictive - probably as addictive as heroin or cocaine.  Over time, your body becomes both physically and psychologically dependent on it. Finally, attempts to quit smoking are complicated by withdrawal reactions like depression, irritability, trouble sleeping, trouble concentrating, restlessness, headaches, weight gain, and excessive fatigue as well as a lack of support.  These symptoms can last from a few days to several weeks.  Why should you  Quit?  Live longer and healthier  Can improves the health of your housemates (children, spouse)  Increases your energy and breathing ability  Lowers risk of heart attack, stroke and cancer  Saves money - For example, if you smoke a pack of cigarettes a day and each pack costs about $3.00, then you will save about $1,100.00 per year, about $5,500. in 5 years and $11,000.00 in 10 years.  What you can do: 1. Talk to your health care provider - There are many smoking cessations aids available, both prescription or over-the-counter.  Check with your doctor and pharmacist before taking any of these products to see which one is best for you.  Develop a plan with your healthcare provider which may include nicotine replacement, prescription medication and/or counseling.  2. Get Started - Mark a start date on your calendar.  Remove cigarettes and ashtrays from your home, car, and office.  Don't be around other smokers.  Stop smoking.not even a puff!  3. Support - Talk to family, friends, and co-workers about your plan to stop smoking.  Ask them not to smoke around you.  4. Coping Strategies  The four "A"s to help during tough times:  a. Avoid - Avoid other smokers or places where smoking is commonplace.  Avoid alcoholic beverages as these may increase your desire to smoke b. Alter - Change your routine.  Drink water/juices instead of alcohol or coffee.  Take a walk or visit with someone during your coffee break.  Change your route to work. c. Alternatives - Substitute raw vegetables like carrot sticks or celery, sugarless candy or gum for the habit of having a cigarette. d. Activities - Start an exercise program (talk to your doctor prior to beginning any exercise program). Try out a new "hands on" hobby to distract you from smoking and to keep your hands busy like woodworking or needlepoint.   Additional tips for specific withdrawal symptoms:   Cravings for tobacco:  - Distract yourself       -  Deep-breathing exercises       - Remember that cravings are brief    Irritability:   - Take a few slow, deep breaths      - Soak in a hot bath    Insomnia:   - Take a walk several hours before bedtime      - Avoid caffeinated beverages after noon      - Read a book      - Take a warm bath      -   Banana or warm milk    Increased appetite:  - Drink water or low-calorie drinks      - Make a survival Kit: Include straws, cinnamon        sticks,       - coffee stirrers, licorice, toothpicks, gum, or fresh         vegetables    Inability to concentrate: - Take a brisk walk      - Lighten your schedule for a couple of days      - Take more breaks   Fatigue:   - Get a good night's sleep      - Take a nap      - Don't overdo it for 2-4 weeks    Constipation, gas,  stomach pain:   - Drink plenty of fluids      - Increase fiber: fruit, raw vegetables, whole grain        cereals      - Talk to your doctor about diet changes    5. Dealing with Relapses - Most relapses occur within the first 2-3 months.  This is common so don't be discouraged.  Some people may take several attempts before they can quit smoking completely.  6. Reward yourself - Set-up a rewards program for every milestone, like 1st month after quitting, 3rd month after quitting, and 6th month after quitting to keep you motivated.  What your doctor can do: Perform a physical exam and order diagnostic tests like laboratory blood work and a chest X-ray.  This will help to identify health related conditions that might benefit from smoking cessation. Review your health history to make sure there are no contraindications with specific smoking cessation aids like allergies to medications or ingredients in these medications or conflicts with your current medications. Prescribe smoking cessation aids such as: Over-the-counter aid:  Nicotine gum, Nicotine patch Prescription aids:  Nicotine spray, Nicotine inhaler, or Bupropion SR  (non-nicotine). Offer or recommend individual or group counseling to support you during the initial quitting and maintenance phase of your smoking cessation program. Offer or recommend other alternative treatments like hypnosis or acupuncture.  What you can expect: Benefits from quitting smoking:  Improved Physical Appearance - Minimizes or stops premature wrinkling of skin, bad breath, stained teeth, gum disease, clothes/hair "smoke" odors, and yellow fingernails  Improved Daily Activities - Food tastes better, sense of smell improves, and decreases shortness of breath during ordinary activities like climbing stairs, walking, and performing light housework  Decreased Financial Cost - From both no longer purchasing cigarettes and the health care cost for medical treatment of conditions caused by smoking.  Health of Others - Decreases risk of exposing others to the effects of second hand smoke.  Sets an example for youth. Benefits of Quitting according to research from the U.S. Surgeon General:  20 minutes after:  Blood pressure lowers & Body temperature normalizes  8 hours after: Carbon monoxide levels begins to normalize   24 hours after: Heart attack risk decreases  2 weeks to 3 months after:  Blood flow improves & lung function increases   1 to 9 months after:  Coughing, sinus congestion, fatigue, shortness of breath decrease   1 year after: Risk of developing coronary heart disease is half that of a smoker  5 years after: Risk of a stroke decreases to that of a nonsmoker  10 years after: Risk of death due to lung cancer death is halved.  Risk of oral, throat, esophagus, bladder,   kidney, and pancreatic cancer decreases.  15 years after: Risk of developing coronary heart disease is half that of a nonsmoker      References:  Here are references that can provide additional information and support:  American Cancer Society     American Heart Association (800)  ACS-2345       (800) 242-1793 or (800) 242-1793 www.cancer.org       www.amhrt.org  American Lung Association    American Academy of Medical Acupuncture (800) 586-4872 or (212) 315-8700  (800) 521-2262 or (323) 937-5514 www.lungusa.org       www.medicalacupuncture.org  National Cancer Institute     Office on Smoking & Health Cancer Information Service    Centers for Disease Control and Prevention (800) 4-CANCER or (800) 422-6237  (770) 448-5705 www.cancer.gov       www.cdc.gov/tobacco  Nicotine Anonymous      Smokefree.gov (877) TRY-NICA (877-879-6422)   (877) 44U-QUIT (877-448-7848) www.nicotine-anonymous.org   www.smokefree.gov  Contact your doctor or pharmacist if you have specific questions about starting a smoking cessation program.   

## 2011-08-23 NOTE — ED Notes (Signed)
Pt. C/o of cough x 1 week but today the cough "has changed". Pt states it has started to sound like a bark. She has been taking tylenol, mucinex, and using saline x 2 days. Pt. States she is unable to cough up any phlegm but when she blows her nose it is yellow.

## 2011-11-23 ENCOUNTER — Emergency Department (HOSPITAL_COMMUNITY)
Admission: EM | Admit: 2011-11-23 | Discharge: 2011-11-23 | Disposition: A | Discharge status: Home / Self Care | Payer: Federal, State, Local not specified - PPO | Attending: Emergency Medicine | Admitting: Emergency Medicine

## 2011-11-23 ENCOUNTER — Encounter (HOSPITAL_COMMUNITY): Payer: Self-pay | Admitting: *Deleted

## 2011-11-23 DIAGNOSIS — M79609 Pain in unspecified limb: Secondary | ICD-10-CM | POA: Insufficient documentation

## 2011-11-23 DIAGNOSIS — Z8742 Personal history of other diseases of the female genital tract: Secondary | ICD-10-CM | POA: Insufficient documentation

## 2011-11-23 DIAGNOSIS — M79669 Pain in unspecified lower leg: Secondary | ICD-10-CM

## 2011-11-23 DIAGNOSIS — F17200 Nicotine dependence, unspecified, uncomplicated: Secondary | ICD-10-CM | POA: Insufficient documentation

## 2011-11-23 DIAGNOSIS — I10 Essential (primary) hypertension: Secondary | ICD-10-CM | POA: Insufficient documentation

## 2011-11-23 LAB — D-DIMER, QUANTITATIVE: D-Dimer, Quant: <0.22 ug/mL-FEU (ref 0.00–0.48)

## 2011-11-23 NOTE — ED Notes (Signed)
To ED for eval of left leg pain for the past 2 wks but now worse. Pt denies swelling but thinks left leg is warm than right leg and more sensitive. No injury. States she was on a 4hr car trip a cple days ago.

## 2011-11-23 NOTE — Discharge Instructions (Signed)
Take ibuprofen as needed for pain and inflammation.  Return to the Emergency Department if symptoms change or worsen, you develop shortness of breath, or pain in your chest.  Return if leg becomes red or swollen.  Follow up with your Primary Care Physician. Gently stress the muscle and try to rest as much as possible.  Apply ice to the calf.  Continue to elevate and use ACE wrap.

## 2011-11-23 NOTE — ED Notes (Addendum)
Pt c/o left leg pain X 3 days. Pt reports pain has increasingly gotten worse and believes the left leg is warmer than her right. Pt is concerned she has a DVT.

## 2011-11-23 NOTE — ED Provider Notes (Signed)
History     CSN: [id number]  Arrival date & time 11/23/11  1324   First MD Initiated Contact with Patient 11/23/11 [phone]      Chief Complaint  Patient presents with  . Leg Pain    (Consider location/radiation/quality/duration/timing/severity/associated sxs/prior treatment) HPI Comments: Patient presenting with left lower leg pain for the past 2 weeks.  Pain worse with ambulation.  Pain located over the gastrocnemius muscle.  No acute trauma or injury.  Patient has not noticed any swelling or erythema.  Patient does not have prior history of DVT or PE.  She denies any CP or SOB.  She uses Depo Provera for birth control.  She is not on any estrogen containing medications.  She reports that a couple of days ago she drove 4 hours.  No other prolonged travel prior to that.  No recent surgeries in the past 4 weeks.  She has tried using an ACE wrap, elevating her leg, and applying warm compresses to the area.  She does not feel that it is helping.  She reports that she does do a lot of walking at work.    Patient is a 36 y.o. female presenting with leg pain. The history is provided by the patient.  Leg Pain  Incident onset: Two weeks ago. Pain location: left calf. The pain has been constant since onset. Pertinent negatives include no numbness, no inability to bear weight, no loss of motion, no muscle weakness, no loss of sensation and no tingling. Exacerbated by: dorsi flexion of the left foot. She has tried nothing for the symptoms.    Past Medical History  Diagnosis Date  . Endometriosis   . Hypertension   . Dysrhythmia   . Tachycardia     Past Surgical History  Procedure Date  . Metatarsil    . Laparoscopy   . Dilation and curettage of uterus   . Induced abortion     Family History  Problem Relation Age of Onset  . Diabetes Mother   . Hypertension Mother   . Hypertension Father   . Kidney disease Father   . Anesthesia problems Neg Hx   . Hypotension Neg Hx   . Malignant  hyperthermia Neg Hx   . Pseudochol deficiency Neg Hx     History  Substance Use Topics  . Smoking status: Current Some Day Smoker -- 0.5 packs/day  . Smokeless tobacco: Never Used  . Alcohol Use: No    OB History    Grav Para Term Preterm Abortions TAB SAB Ect Mult Living   6 2 2  0 4 2 2  0 0 2      Review of Systems  Constitutional: Negative for fever and chills.  Respiratory: Negative for chest tightness and shortness of breath.   Cardiovascular: Negative for chest pain and leg swelling.  Musculoskeletal: Negative for joint swelling and gait problem.       Left calf pain  Skin: Negative for color change.  Neurological: Negative for tingling and numbness.    Allergies  Review of patient's allergies indicates no known allergies.  Home Medications   Current Outpatient Rx  Name Route Sig Dispense Refill  . LISINOPRIL 10 MG PO TABS Oral Take 10 mg by mouth daily.    Marland Kitchen MEDROXYPROGESTERONE ACETATE 104 MG/0.65ML [place] SUSP Subcutaneous Inject 104 mg into the skin every 3 (three) months.    Marland Kitchen PROPRANOLOL HCL 10 MG PO TABS Oral Take 10 mg by mouth 3 (three) times daily.    Marland Kitchen  TRAMADOL HCL 50 MG PO TABS Oral Take 50 mg by mouth every 6 (six) hours as needed. Menstrual cramps      BP 125/71  Pulse 109  Temp 98.7 F (37.1 C) (Oral)  Resp 18  SpO2 99%  Physical Exam  Nursing note and vitals reviewed. Constitutional: She appears well-developed and well-nourished. No distress.  HENT:  Head: Normocephalic.  Mouth/Throat: Oropharynx is clear and moist.  Cardiovascular: Normal rate, regular rhythm, normal heart sounds and intact distal pulses.   Pulses:      Dorsalis pedis pulses are 2+ on the right side, and 2+ on the left side.  Pulmonary/Chest: Effort normal and breath sounds normal. No respiratory distress. She has no wheezes.  Abdominal: Soft. Bowel sounds are normal.  Musculoskeletal:       Positive Homan's sign No edema of left calf  Neurological: She is alert. She has  normal strength. No sensory deficit. Gait normal.  Skin: Skin is warm and dry. She is not diaphoretic.       No erythema or palpable cords of the calf muscles bilaterally  Psychiatric: She has a normal mood and affect.    ED Course  Procedures (including critical care time)   Labs Reviewed  D-DIMER, QUANTITATIVE   No results found.   No diagnosis found.    MDM  Patient presenting with left calf pain that has been present for the past 2 weeks.  No swelling or erythema.  Probability of DVT low.  D-dimer negative.  Feel that pain may be muscular.  Patient discharged home and instructed to follow up with PCP.        Pascal Lux Tarrant, PA-C 11/23/11 [phone]

## 2011-11-30 NOTE — ED Provider Notes (Signed)
Medical screening examination/treatment/procedure(s) were performed by non-physician practitioner and as supervising physician I was immediately available for consultation/collaboration.  Etha Stambaugh, MD 11/30/11 0046 

## 2013-06-28 HISTORY — PX: EYE SURGERY: SHX253

## 2013-06-30 ENCOUNTER — Other Ambulatory Visit: Payer: Self-pay | Admitting: Obstetrics and Gynecology

## 2013-07-13 ENCOUNTER — Encounter (HOSPITAL_COMMUNITY)
Admission: RE | Admit: 2013-07-13 | Discharge: 2013-07-13 | Disposition: A | Discharge status: Home / Self Care | Payer: Federal, State, Local not specified - PPO | Source: Ambulatory Visit | Attending: Obstetrics and Gynecology | Admitting: Obstetrics and Gynecology

## 2013-07-13 ENCOUNTER — Other Ambulatory Visit: Payer: Self-pay | Admitting: Obstetrics and Gynecology

## 2013-07-13 ENCOUNTER — Encounter (HOSPITAL_COMMUNITY): Payer: Self-pay

## 2013-07-13 DIAGNOSIS — Z01812 Encounter for preprocedural laboratory examination: Secondary | ICD-10-CM | POA: Insufficient documentation

## 2013-07-13 HISTORY — DX: Unspecified osteoarthritis, unspecified site: M19.90

## 2013-07-13 HISTORY — DX: Gastro-esophageal reflux disease without esophagitis: K21.9

## 2013-07-13 HISTORY — DX: Headache: R51

## 2013-07-13 HISTORY — DX: Anxiety disorder, unspecified: F41.9

## 2013-07-13 LAB — CBC
HCT: 40.3 % (ref 36.0–46.0)
Hemoglobin: 13.6 g/dL (ref 12.0–15.0)
MCH: 29.1 pg (ref 26.0–34.0)
MCHC: 33.7 g/dL (ref 30.0–36.0)
MCV: 86.1 fL (ref 78.0–100.0)
PLATELETS: 252 10*3/uL (ref 150–400)
RBC: 4.68 MIL/uL (ref 3.87–5.11)
RDW: 13.2 % (ref 11.5–15.5)
WBC: 5.7 10*3/uL (ref 4.0–10.5)

## 2013-07-13 LAB — BASIC METABOLIC PANEL
BUN: 8 mg/dL (ref 6–23)
CALCIUM: 10 mg/dL (ref 8.4–10.5)
CO2: 26 meq/L (ref 19–32)
CREATININE: 0.88 mg/dL (ref 0.50–1.10)
Chloride: 107 mEq/L (ref 96–112)
GFR, EST NON AFRICAN AMERICAN: 82 mL/min — AB (ref >90)
Glucose, Bld: 95 mg/dL (ref 70–99)
Potassium: 4 mEq/L (ref 3.7–5.3)
SODIUM: 142 meq/L (ref 137–147)

## 2013-07-13 NOTE — Patient Instructions (Addendum)
   Your procedure is scheduled on:  Wednesday, Feb 18  Enter through the [employer] [facility] at:  7 AM Pick up the phone at the desk and dial [phone] and inform us of your arrival.  Please call this number if you have any problems the morning of surgery: [phone]  Remember: Do not eat or drink after midnight: Tuesday Take these medicines the morning of surgery with a SIP OF WATER:  toprol-xl,  diovan-hct   Do not wear jewelry, make-up, or FINGER nail polish No metal in your hair or on your body. Do not wear lotions, powders, perfumes. You may wear deodorant.  Do not bring valuables to the hospital. Contacts, dentures or bridgework may not be worn into surgery.  Patients discharged on the day of surgery will not be allowed to drive home.  Home with daughter [name]  cell [phone].

## 2013-07-19 ENCOUNTER — Ambulatory Visit (HOSPITAL_COMMUNITY)
Admission: RE | Admit: 2013-07-19 | Discharge: 2013-07-19 | Disposition: A | Discharge status: Home / Self Care | Payer: Federal, State, Local not specified - PPO | Source: Ambulatory Visit | Attending: Obstetrics and Gynecology | Admitting: Obstetrics and Gynecology

## 2013-07-19 ENCOUNTER — Encounter (HOSPITAL_COMMUNITY)
Admission: RE | Disposition: A | Discharge status: Home / Self Care | Payer: Self-pay | Source: Ambulatory Visit | Attending: Obstetrics and Gynecology

## 2013-07-19 ENCOUNTER — Encounter (HOSPITAL_COMMUNITY): Payer: Federal, State, Local not specified - PPO | Admitting: Anesthesiology

## 2013-07-19 ENCOUNTER — Ambulatory Visit (HOSPITAL_COMMUNITY): Payer: Federal, State, Local not specified - PPO | Admitting: Anesthesiology

## 2013-07-19 ENCOUNTER — Encounter (HOSPITAL_COMMUNITY): Payer: Self-pay | Admitting: Anesthesiology

## 2013-07-19 DIAGNOSIS — K219 Gastro-esophageal reflux disease without esophagitis: Secondary | ICD-10-CM | POA: Insufficient documentation

## 2013-07-19 DIAGNOSIS — N938 Other specified abnormal uterine and vaginal bleeding: Secondary | ICD-10-CM | POA: Insufficient documentation

## 2013-07-19 DIAGNOSIS — D250 Submucous leiomyoma of uterus: Secondary | ICD-10-CM | POA: Insufficient documentation

## 2013-07-19 DIAGNOSIS — N925 Other specified irregular menstruation: Secondary | ICD-10-CM | POA: Insufficient documentation

## 2013-07-19 DIAGNOSIS — N949 Unspecified condition associated with female genital organs and menstrual cycle: Secondary | ICD-10-CM | POA: Insufficient documentation

## 2013-07-19 DIAGNOSIS — N803 Endometriosis of pelvic peritoneum, unspecified: Secondary | ICD-10-CM

## 2013-07-19 DIAGNOSIS — F17200 Nicotine dependence, unspecified, uncomplicated: Secondary | ICD-10-CM | POA: Insufficient documentation

## 2013-07-19 HISTORY — PX: LAPAROSCOPY: SHX197

## 2013-07-19 HISTORY — PX: HYSTEROSCOPY WITH D & C: SHX1775

## 2013-07-19 LAB — PREGNANCY, URINE: PREG TEST UR: NEGATIVE

## 2013-07-19 SURGERY — LAPAROSCOPY, DIAGNOSTIC
Anesthesia: General | Site: Uterus

## 2013-07-19 MED ORDER — BUPIVACAINE HCL (PF) 0.25 % IJ SOLN
INTRAMUSCULAR | Status: AC
  Filled 2013-07-19: qty 30

## 2013-07-19 MED ORDER — NEOSTIGMINE METHYLSULFATE 1 MG/ML IJ SOLN
INTRAMUSCULAR | Status: DC | PRN
  Administered 2013-07-19: 4 mg via INTRAVENOUS

## 2013-07-19 MED ORDER — KETOROLAC TROMETHAMINE 30 MG/ML IJ SOLN
INTRAMUSCULAR | Status: DC | PRN
  Administered 2013-07-19: 30 mg via INTRAVENOUS
  Administered 2013-07-19: 30 mg via INTRAMUSCULAR

## 2013-07-19 MED ORDER — GLYCOPYRROLATE 0.2 MG/ML IJ SOLN
INTRAMUSCULAR | Status: DC | PRN
  Administered 2013-07-19: .3 mg via INTRAVENOUS
  Administered 2013-07-19: .7 mg via INTRAVENOUS

## 2013-07-19 MED ORDER — MIDAZOLAM HCL 2 MG/2ML IJ SOLN
INTRAMUSCULAR | Status: AC
  Filled 2013-07-19: qty 2

## 2013-07-19 MED ORDER — KETOROLAC TROMETHAMINE 30 MG/ML IJ SOLN
INTRAMUSCULAR | Status: AC
Start: 2013-07-19 — End: 2013-07-19
  Filled 2013-07-19: qty 1

## 2013-07-19 MED ORDER — ROCURONIUM BROMIDE 100 MG/10ML IV SOLN
INTRAVENOUS | Status: AC
  Filled 2013-07-19: qty 1

## 2013-07-19 MED ORDER — FENTANYL CITRATE 0.05 MG/ML IJ SOLN
25.0000 ug | INTRAMUSCULAR | Status: DC | PRN
  Administered 2013-07-19: 50 ug via INTRAVENOUS

## 2013-07-19 MED ORDER — GLYCINE 1.5 % IR SOLN
Status: DC | PRN
  Administered 2013-07-19: 3000 mL

## 2013-07-19 MED ORDER — ONDANSETRON HCL 4 MG/2ML IJ SOLN
INTRAMUSCULAR | Status: AC
  Filled 2013-07-19: qty 2

## 2013-07-19 MED ORDER — DEXAMETHASONE SODIUM PHOSPHATE 10 MG/ML IJ SOLN
INTRAMUSCULAR | Status: AC
  Filled 2013-07-19: qty 1

## 2013-07-19 MED ORDER — METOCLOPRAMIDE HCL 5 MG/ML IJ SOLN
10.0000 mg | Freq: Once | INTRAMUSCULAR | Status: AC | PRN
  Administered 2013-07-19: 10 mg via INTRAVENOUS

## 2013-07-19 MED ORDER — ONDANSETRON HCL 4 MG/2ML IJ SOLN
INTRAMUSCULAR | Status: DC | PRN
  Administered 2013-07-19: 4 mg via INTRAVENOUS

## 2013-07-19 MED ORDER — NEOSTIGMINE METHYLSULFATE 1 MG/ML IJ SOLN
INTRAMUSCULAR | Status: AC
  Filled 2013-07-19: qty 1

## 2013-07-19 MED ORDER — FENTANYL CITRATE 0.05 MG/ML IJ SOLN
INTRAMUSCULAR | Status: AC
  Filled 2013-07-19: qty 5

## 2013-07-19 MED ORDER — LACTATED RINGERS IV SOLN
INTRAVENOUS | Status: DC
  Administered 2013-07-19 (×2): via INTRAVENOUS

## 2013-07-19 MED ORDER — ROCURONIUM BROMIDE 100 MG/10ML IV SOLN
INTRAVENOUS | Status: DC | PRN
  Administered 2013-07-19: 50 mg via INTRAVENOUS

## 2013-07-19 MED ORDER — FENTANYL CITRATE 0.05 MG/ML IJ SOLN
INTRAMUSCULAR | Status: DC | PRN
  Administered 2013-07-19: 100 ug via INTRAVENOUS
  Administered 2013-07-19 (×3): 50 ug via INTRAVENOUS

## 2013-07-19 MED ORDER — METOCLOPRAMIDE HCL 5 MG/ML IJ SOLN
INTRAMUSCULAR | Status: AC
  Filled 2013-07-19: qty 2

## 2013-07-19 MED ORDER — LIDOCAINE HCL (CARDIAC) 20 MG/ML IV SOLN
INTRAVENOUS | Status: DC | PRN
  Administered 2013-07-19: 80 mg via INTRAVENOUS

## 2013-07-19 MED ORDER — LIDOCAINE HCL (CARDIAC) 20 MG/ML IV SOLN
INTRAVENOUS | Status: AC
  Filled 2013-07-19: qty 5

## 2013-07-19 MED ORDER — LACTATED RINGERS IR SOLN
Status: DC | PRN
  Administered 2013-07-19: 3000 mL

## 2013-07-19 MED ORDER — PROPOFOL 10 MG/ML IV EMUL
INTRAVENOUS | Status: AC
  Filled 2013-07-19: qty 20

## 2013-07-19 MED ORDER — GLYCOPYRROLATE 0.2 MG/ML IJ SOLN
INTRAMUSCULAR | Status: AC
  Filled 2013-07-19: qty 3

## 2013-07-19 MED ORDER — MEPERIDINE HCL 25 MG/ML IJ SOLN: 6.2500 mg | INTRAMUSCULAR | Status: DC | PRN

## 2013-07-19 MED ORDER — BUPIVACAINE HCL (PF) 0.25 % IJ SOLN
INTRAMUSCULAR | Status: DC | PRN
  Administered 2013-07-19: 8 mL

## 2013-07-19 MED ORDER — DEXAMETHASONE SODIUM PHOSPHATE 10 MG/ML IJ SOLN
INTRAMUSCULAR | Status: DC | PRN
  Administered 2013-07-19: 10 mg via INTRAVENOUS

## 2013-07-19 MED ORDER — EPHEDRINE SULFATE 50 MG/ML IJ SOLN
INTRAMUSCULAR | Status: DC | PRN
  Administered 2013-07-19 (×2): 10 mg via INTRAVENOUS
  Administered 2013-07-19: 5 mg via INTRAVENOUS
  Administered 2013-07-19: 10 mg via INTRAVENOUS

## 2013-07-19 MED ORDER — PROPOFOL 10 MG/ML IV BOLUS
INTRAVENOUS | Status: DC | PRN
  Administered 2013-07-19: 200 mg via INTRAVENOUS

## 2013-07-19 MED ORDER — KETOROLAC TROMETHAMINE 30 MG/ML IJ SOLN
INTRAMUSCULAR | Status: AC
  Filled 2013-07-19: qty 1

## 2013-07-19 MED ORDER — MIDAZOLAM HCL 2 MG/2ML IJ SOLN
INTRAMUSCULAR | Status: DC | PRN
  Administered 2013-07-19: 2 mg via INTRAVENOUS

## 2013-07-19 MED ORDER — FENTANYL CITRATE 0.05 MG/ML IJ SOLN
INTRAMUSCULAR | Status: AC
  Administered 2013-07-19: 50 ug via INTRAVENOUS
  Filled 2013-07-19: qty 2

## 2013-07-19 SURGICAL SUPPLY — 80 items
APPLICATOR COTTON TIP 6IN STRL (MISCELLANEOUS) ×4 IMPLANT
BAG URINE DRAINAGE (UROLOGICAL SUPPLIES) ×4 IMPLANT
BARRIER ADHS 3X4 INTERCEED (GAUZE/BANDAGES/DRESSINGS) ×4 IMPLANT
CABLE HIGH FREQUENCY MONO STRZ (ELECTRODE) ×4 IMPLANT
CANISTER SUCT 3000ML (MISCELLANEOUS) ×4 IMPLANT
CATH FOLEY 3WAY  5CC 16FR (CATHETERS) ×1
CATH FOLEY 3WAY 5CC 16FR (CATHETERS) ×3 IMPLANT
CATH ROBINSON RED A/P 16FR (CATHETERS) ×4 IMPLANT
CHLORAPREP W/TINT 26ML (MISCELLANEOUS) ×4 IMPLANT
CLOTH BEACON ORANGE TIMEOUT ST (SAFETY) ×4 IMPLANT
CONT PATH 16OZ SNAP LID 3702 (MISCELLANEOUS) ×4 IMPLANT
CONTAINER PREFILL 10% NBF 60ML (FORM) ×8 IMPLANT
COVER MAYO STAND STRL (DRAPES) ×4 IMPLANT
COVER TABLE BACK 60X90 (DRAPES) ×8 IMPLANT
COVER TIP SHEARS 8 DVNC (MISCELLANEOUS) ×3 IMPLANT
COVER TIP SHEARS 8MM DA VINCI (MISCELLANEOUS) ×1
DECANTER SPIKE VIAL GLASS SM (MISCELLANEOUS) ×4 IMPLANT
DERMABOND ADVANCED (GAUZE/BANDAGES/DRESSINGS) ×1
DERMABOND ADVANCED .7 DNX12 (GAUZE/BANDAGES/DRESSINGS) ×3 IMPLANT
DEVICE TROCAR PUNCTURE CLOSURE (ENDOMECHANICALS) IMPLANT
DRAPE HUG U DISPOSABLE (DRAPE) ×4 IMPLANT
DRAPE HYSTEROSCOPY (DRAPE) ×4 IMPLANT
DRAPE LG THREE QUARTER DISP (DRAPES) ×8 IMPLANT
DRAPE WARM FLUID 44X44 (DRAPE) ×4 IMPLANT
DRSG TELFA 3X8 NADH (GAUZE/BANDAGES/DRESSINGS) ×4 IMPLANT
ELECT LIGASURE LONG (ELECTRODE) IMPLANT
ELECT REM PT RETURN 9FT ADLT (ELECTROSURGICAL) ×4
ELECTRODE REM PT RTRN 9FT ADLT (ELECTROSURGICAL) ×3 IMPLANT
EVACUATOR SMOKE 8.L (FILTER) ×4 IMPLANT
FORCEPS CUTTING 33CM 5MM (CUTTING FORCEPS) IMPLANT
FORCEPS CUTTING 45CM 5MM (CUTTING FORCEPS) IMPLANT
GAUZE VASELINE 3X9 (GAUZE/BANDAGES/DRESSINGS) IMPLANT
GLOVE BIOGEL PI IND STRL 7.0 (GLOVE) ×6 IMPLANT
GLOVE BIOGEL PI INDICATOR 7.0 (GLOVE) ×2
GLOVE ECLIPSE 6.5 STRL STRAW (GLOVE) ×4 IMPLANT
GOWN STRL REUS W/TWL LRG LVL3 (GOWN DISPOSABLE) ×24 IMPLANT
KIT ACCESSORY DA VINCI DISP (KITS) ×1
KIT ACCESSORY DVNC DISP (KITS) ×3 IMPLANT
LEGGING LITHOTOMY PAIR STRL (DRAPES) ×4 IMPLANT
LOOP ANGLED CUTTING 22FR (CUTTING LOOP) IMPLANT
NEEDLE INSUFFLATION 120MM (ENDOMECHANICALS) ×4 IMPLANT
NEEDLE SPNL 22GX3.5 QUINCKE BK (NEEDLE) ×4 IMPLANT
NS IRRIG 1000ML POUR BTL (IV SOLUTION) ×12 IMPLANT
OCCLUDER COLPOPNEUMO (BALLOONS) IMPLANT
PACK LAPAROSCOPY BASIN (CUSTOM PROCEDURE TRAY) IMPLANT
PACK LAVH (CUSTOM PROCEDURE TRAY) ×4 IMPLANT
PACK VAGINAL MINOR WOMEN LF (CUSTOM PROCEDURE TRAY) ×4 IMPLANT
PAD OB MATERNITY 4.3X12.25 (PERSONAL CARE ITEMS) ×4 IMPLANT
PAD PREP 24X48 CUFFED NSTRL (MISCELLANEOUS) ×8 IMPLANT
PLUG CATH AND CAP STER (CATHETERS) ×4 IMPLANT
PROTECTOR NERVE ULNAR (MISCELLANEOUS) ×8 IMPLANT
SCISSORS LAP 5X35 DISP (ENDOMECHANICALS) ×4 IMPLANT
SET CYSTO W/LG BORE CLAMP LF (SET/KITS/TRAYS/PACK) IMPLANT
SET IRRIG TUBING LAPAROSCOPIC (IRRIGATION / IRRIGATOR) ×4 IMPLANT
SET TUBING HYSTEROSCOPY 2 NDL (TUBING) ×4 IMPLANT
SOLUTION ELECTROLUBE (MISCELLANEOUS) ×4 IMPLANT
SUT VIC AB 0 CT1 27 (SUTURE) ×5
SUT VIC AB 0 CT1 27XBRD ANTBC (SUTURE) ×15 IMPLANT
SUT VICRYL 0 UR6 27IN ABS (SUTURE) ×4 IMPLANT
SUT VICRYL 4-0 PS2 18IN ABS (SUTURE) ×8 IMPLANT
SYR 50ML LL SCALE MARK (SYRINGE) ×4 IMPLANT
SYR CONTROL 10ML LL (SYRINGE) ×4 IMPLANT
SYSTEM CONVERTIBLE TROCAR (TROCAR) IMPLANT
TIP UTERINE 5.1X6CM LAV DISP (MISCELLANEOUS) IMPLANT
TIP UTERINE 6.7X10CM GRN DISP (MISCELLANEOUS) IMPLANT
TIP UTERINE 6.7X6CM WHT DISP (MISCELLANEOUS) IMPLANT
TIP UTERINE 6.7X8CM BLUE DISP (MISCELLANEOUS) ×4 IMPLANT
TOWEL OR 17X24 6PK STRL BLUE (TOWEL DISPOSABLE) ×12 IMPLANT
TROCAR 12M 150ML BLUNT (TROCAR) IMPLANT
TROCAR BALLN 12MMX100 BLUNT (TROCAR) IMPLANT
TROCAR DISP BLADELESS 8 DVNC (TROCAR) ×3 IMPLANT
TROCAR DISP BLADELESS 8MM (TROCAR) ×1
TROCAR OPTI TIP 12M 100M (ENDOMECHANICALS) ×4 IMPLANT
TROCAR OPTI TIP 5M 100M (ENDOMECHANICALS) ×8 IMPLANT
TROCAR XCEL DIL TIP R 11M (ENDOMECHANICALS) ×4 IMPLANT
TROCAR Z-THREAD 12X150 (TROCAR) ×4 IMPLANT
TUBE HYSTEROSCOPY W Y-CONNECT (TUBING) ×4 IMPLANT
TUBING FILTER THERMOFLATOR (ELECTROSURGICAL) ×4 IMPLANT
WARMER LAPAROSCOPE (MISCELLANEOUS) ×4 IMPLANT
WATER STERILE IRR 1000ML POUR (IV SOLUTION) ×4 IMPLANT

## 2013-07-19 NOTE — Discharge Instructions (Signed)
Warm heat to abdomen every 4 hours x 24 hrs, walk aroundDISCHARGE INSTRUCTIONS: Laparoscopy  The following instructions have been prepared to help you care for yourself upon your return home today.  Wound care:  Do not get the incision wet for the first 24 hours. The incision should be kept clean and dry.  The Band-Aids or dressings may be removed the day after surgery.  Should the incision become sore, red, and swollen after the first week, check with your doctor.  Personal hygiene:  Shower the day after your procedure.  Activity and limitations:  Do NOT drive or operate any equipment today.  Do NOT lift anything more than 15 pounds for 2-3 weeks after surgery.  Do NOT rest in bed all day.  Walking is encouraged. Walk each day, starting slowly with 5-minute walks 3 or 4 times a day. Slowly increase the length of your walks.  Walk up and down stairs slowly.  Do NOT do strenuous activities, such as golfing, playing tennis, bowling, running, biking, weight lifting, gardening, mowing, or vacuuming for 2-4 weeks. Ask your doctor when it is okay to start.  Diet: Eat a light meal as desired this evening. You may resume your usual diet tomorrow.  Return to work: This is dependent on the type of work you do. For the most part you can return to a desk job within a week of surgery. If you are more active at work, please discuss this with your doctor.  What to expect after your surgery: You may have a slight burning sensation when you urinate on the first day. You may have a very small amount of blood in the urine. Expect to have a small amount of vaginal discharge/light bleeding for 1-2 weeks. It is not unusual to have abdominal soreness and bruising for up to 2 weeks. You may be tired and need more rest for about 1 week. You may experience shoulder pain for 24-72 hours. Lying flat in bed may relieve it.  NO IBUPROFEN PRODUCTS (MOTRIN, ADVIL) OR ALEVE UNTIL 2:45PM TODAY.   Call your  doctor for any of the following:  Develop a fever of 100.4 or greater  Inability to urinate 6 hours after discharge from hospital  Severe pain not relieved by pain medications  Persistent of heavy bleeding at incision site  Redness or swelling around incision site after a week  Increasing nausea or vomiting  Patient Signature________________________________________ Nurse Signature_________________________________________

## 2013-07-19 NOTE — Anesthesia Preprocedure Evaluation (Signed)
Anesthesia Evaluation  Patient identified by MRN, date of birth, ID band Patient awake    Reviewed: Allergy & Precautions, H&P , NPO status , Patient's Chart, lab work & pertinent test results  Airway Mallampati: III TM Distance: >3 FB Neck ROM: Full    Dental no notable dental hx. (+) Teeth Intact   Pulmonary Current Smoker,  breath sounds clear to auscultation  Pulmonary exam normal       Cardiovascular hypertension, Pt. on medications and Pt. on home beta blockers + dysrhythmias Rhythm:Regular Rate:Normal     Neuro/Psych  Headaches, Anxiety    GI/Hepatic Neg liver ROS, GERD-  ,  Endo/Other  negative endocrine ROS  Renal/GU negative Renal ROS  negative genitourinary   Musculoskeletal negative musculoskeletal ROS (+)   Abdominal   Peds  Hematology negative hematology ROS (+)   Anesthesia Other Findings   Reproductive/Obstetrics Endometriosis Pelvic Pain  DUB                           Anesthesia Physical Anesthesia Plan  ASA: II  Anesthesia Plan: General   Post-op Pain Management:    Induction: Intravenous  Airway Management Planned: Oral ETT  Additional Equipment:   Intra-op Plan:   Post-operative Plan: Extubation in OR  Informed Consent: I have reviewed the patients History and Physical, chart, labs and discussed the procedure including the risks, benefits and alternatives for the proposed anesthesia with the patient or authorized representative who has indicated his/her understanding and acceptance.   Dental advisory given  Plan Discussed with: CRNA, Anesthesiologist and Surgeon  Anesthesia Plan Comments:         Anesthesia Quick Evaluation

## 2013-07-19 NOTE — Preoperative (Signed)
Beta Blockers   Reason not to administer Beta Blockers:bblocker taken this am 07/19/13

## 2013-07-19 NOTE — Transfer of Care (Signed)
Immediate Anesthesia Transfer of Care Note  Patient: Stacey Stanley  Procedure(s) Performed: Procedure(s): LAPAROSCOPY DIAGNOSTIC (N/A) ROBOTIC ASSISTED LAPAROSCOPIC LYSIS OF ADHESION; Resection of pelvic endometriosis if present (N/A) DILATATION AND CURETTAGE /HYSTEROSCOPY (N/A)  Patient Location: PACU  Anesthesia Type:General  Level of Consciousness: awake, alert  and oriented  Airway & Oxygen Therapy: Patient Spontanous Breathing and Patient connected to nasal cannula oxygen  Post-op Assessment: Report given to PACU RN, Post -op Vital signs reviewed and stable and Patient moving all extremities  Post vital signs: Reviewed and stable  Complications: No apparent anesthesia complications

## 2013-07-19 NOTE — Brief Op Note (Signed)
07/19/2013  10:26 AM  PATIENT:  Stacey Stanley  38 y.o. female  PRE-OPERATIVE DIAGNOSIS:  Endometriosis,dysfunctional uterine bleeding, pelvic pain  POST-OPERATIVE DIAGNOSIS: Stage 1 pelvic  Endometriosis,dysfunctional uterine bleeding, pelvic pain, submucosal fibroid  PROCEDURE:  Diagnostic laparoscopy, resection of pelvic endometriosis, diagnostic hysteroscopy, hysteroscopic resection of submucosal fibroid, dilation and curettage  SURGEON:  Surgeon(s) and Role:    * Skylin Kennerson A Chlora Mcbain, MD - Primary  PHYSICIAN ASSISTANT:   ASSISTANTS: Rolitta dawson, CNM   ANESTHESIA:   general Findings: nl tubes, left ov with clear cyst, uterus with small SS fibroids, nl right ov, nl appendix, ureters seen bilaterally, left peritoneal endometriosis paracolic area, sm fibroid, arcuate uterus EBL:  Total I/O In: 1400 [I.V.:1400] Out: 200 [Urine:150; Blood:50]  BLOOD ADMINISTERED:none  DRAINS: none   LOCAL MEDICATIONS USED:  MARCAINE     SPECIMEN:  Source of Specimen:  peritoneal pelvic endometriosis implant, submucosal fibroid resection wtith emc  DISPOSITION OF SPECIMEN:  PATHOLOGY  COUNTS:  YES  TOURNIQUET:  * No tourniquets in log *  DICTATION: .Other Dictation: Dictation Number [id number]  PLAN OF CARE: Discharge to home after PACU  PATIENT DISPOSITION:  PACU - hemodynamically stable.   Delay start of Pharmacological VTE agent (>24hrs) due to surgical blood loss or risk of bleeding: no

## 2013-07-19 NOTE — OR Nursing (Signed)
First procedure finished 0900

## 2013-07-19 NOTE — Anesthesia Postprocedure Evaluation (Signed)
  Anesthesia Post-op Note  Patient: Stacey Stanley  Procedure(s) Performed: Procedure(s): LAPAROSCOPY DIAGNOSTIC  with resection of endometriosis (N/A) DILATATION AND CURETTAGE /HYSTEROSCOPY (N/A)  Patient Location: PACU  Anesthesia Type:General  Level of Consciousness: awake, alert  and oriented  Airway and Oxygen Therapy: Patient Spontanous Breathing  Post-op Pain: mild  Post-op Assessment: Post-op Vital signs reviewed, Patient's Cardiovascular Status Stable, Respiratory Function Stable, Patent Airway, No signs of Nausea or vomiting and Pain level controlled  Post-op Vital Signs: Reviewed and stable  Complications: No apparent anesthesia complications

## 2013-07-20 ENCOUNTER — Encounter (HOSPITAL_COMMUNITY): Payer: Self-pay | Admitting: Obstetrics and Gynecology

## 2013-07-20 NOTE — Op Note (Signed)
Stacey Stanley, Stacey Stanley                ACCOUNT NO.:  [id number]  MEDICAL RECORD NO.:  [id number]  LOCATION:  [facility]                          FACILITY:  [facility]  PHYSICIAN:  Servando Salina, M.D.DATE OF BIRTH:  1976/04/24  DATE OF PROCEDURE:  07/19/2013 DATE OF DISCHARGE:  07/19/2013                              OPERATIVE REPORT   PREOPERATIVE DIAGNOSES:  Dysfunctional uterine bleeding, pelvic pain, history of pelvic endometriosis.  PROCEDURE:  Diagnostic laparoscopy, laparoscopic resection of pelvic endometriosis, diagnostic hysteroscopy, hysteroscopic resection of a submucosal fibroid, dilation and curettage.  POSTOPERATIVE DIAGNOSIS:  Dysfunction uterine bleeding, submucosal fibroid, stage I pelvic endometriosis, pelvic pain.  ANESTHESIA:  General.  SURGEON:  Servando Salina, M.D.  ASSISTANT:  Laury Deep, CNM.  DESCRIPTION OF PROCEDURE:  Under adequate general anesthesia, the patient was placed in the dorsal lithotomy position.  She was sterilely prepped and draped in usual fashion.  Examination under anesthesia revealed an anteverted uterus.  No adnexal masses could be appreciated. A bivalve speculum was placed in the vagina.  Single-tooth tenaculum was placed on the anterior lip of the cervix.  The cervix was parous and easily accepted a #19 Presenter, broadcasting.  A hysteroscope was introduced into the uterine cavity.  Accurate uterus was noted, both tubal ostia could be seen.  On the anterior fundal aspect of the uterine cavity, there was submucosal fibroid.  The diagnostic hysteroscope was removed.  The resectoscope was entered with a single loop.  The fibroid was resected and removed.  The resectoscope was removed.  The cavity was then gently curetted for moderate amount of tissue.  The specimen labeled endometrial curetting and submucosal fibroid was sent to Pathology. The uterus sounded to 9.5 cm and # 10 uterine manipulator was inserted without difficulty.  The bivalve  speculum was removed.  A 3-way Foley catheter was sterilely placed.  Attention was then turned to the abdomen.  The patient had a previous laparoscopy scar, which was located infraumbilically.  A 0.25% Marcaine was then injected at that site and incision was then made. Veress needle was introduced.  Opening pressure of 7 was noted 3.5 L of CO2 was insufflated.  The Veress needle was then removed.  A 12-mm trocar with sleeve was introduced into the abdomen without difficulty. The robotic camera port was then inserted.  The panoramic inspection showed no trauma to entering the abdomen.  Normal liver edge was noted.  The patient was placed in then Trendelenburg position.  The pelvis with that the uterus was manipulated; anterior cul-de-sac was without any lesions.  The posterior cul-de-sac was without any lesions. Small left posterior lateral subserosal fibroid was noted.  The fallopian tubes bilaterally were normal.  The left ovary had a clear appearing cyst and the right ovary was normal.  A suprapubic incision was made and a probe was then utilized to inspect the remaining pelvis.  Both ureters were seen peristalsing nicely.  There was a focal area of endometriotic implant on the left pericolic gutter and an incision was made in the left lower quadrant and a 5-mm port was placed and using a grasper the peritoneum above the endometriosis was grasped and the hot scissors were used to  remove the endometriotic implant and cauterization was then performed at this site for hemostasis.  There was a small bleeding area on the fundus of the uterus, which was cauterized as well.  The left ovarian cyst was just opened with clear fluid noted, but the cyst wall itself was not removed.  The patient has had a prior ultrasound just prior to coming into the surgery and did not have a cyst noted at that time.  The pelvis was irrigated and suctioned.  Good hemostasis was noted.  The lower ports were  then removed under direct visualization.  The infraumbilical site was then removed.  The uterine manipulator was removed as well.  The incisions was closed with 4-0 Vicryl subcuticular stitches and for the infraumbilical site 0 Vicryl figure-of-eight fascial stitches and the skin approximated using 4-0 Vicryl subcuticular stitches.  Inspection of the vagina with reinsertion of the speculum showed no lacerations and the cervix was fine.  SPECIMEN:  The peritoneal implants as well as the endometrial curetting with a fibroid sent to Pathology.  ESTIMATED BLOOD LOSS:  Less than 30 mL.  COMPLICATION:  None.  FLUID DEFICIT:  125 mL.  The patient tolerated the procedure well was transferred to recovery in stable condition.     Servando Salina, M.D.     [place]/MEDQ  D:  07/19/2013  T:  07/20/2013  Job:  [id number]

## 2013-12-11 ENCOUNTER — Ambulatory Visit: Payer: Self-pay | Admitting: Podiatry

## 2013-12-19 LAB — HM PAP SMEAR: HM Pap smear: NORMAL

## 2013-12-27 ENCOUNTER — Other Ambulatory Visit (HOSPITAL_COMMUNITY): Payer: Self-pay | Admitting: Obstetrics and Gynecology

## 2013-12-27 DIAGNOSIS — Z1231 Encounter for screening mammogram for malignant neoplasm of breast: Secondary | ICD-10-CM

## 2013-12-28 LAB — LIPID PANEL
Cholesterol: 154 mg/dL (ref 0–200)
HDL: 57 mg/dL (ref 35–70)
LDL Cholesterol: 84 mg/dL
TRIGLYCERIDES: 67 mg/dL (ref 40–160)

## 2014-01-01 ENCOUNTER — Ambulatory Visit (HOSPITAL_COMMUNITY): Payer: Federal, State, Local not specified - PPO | Attending: Obstetrics and Gynecology

## 2014-01-01 ENCOUNTER — Emergency Department (HOSPITAL_COMMUNITY)
Admission: EM | Admit: 2014-01-01 | Discharge: 2014-01-01 | Disposition: A | Discharge status: Home / Self Care | Payer: Federal, State, Local not specified - PPO | Attending: Emergency Medicine | Admitting: Emergency Medicine

## 2014-01-01 DIAGNOSIS — I10 Essential (primary) hypertension: Secondary | ICD-10-CM | POA: Insufficient documentation

## 2014-01-01 DIAGNOSIS — Z79899 Other long term (current) drug therapy: Secondary | ICD-10-CM | POA: Insufficient documentation

## 2014-01-01 DIAGNOSIS — Z9889 Other specified postprocedural states: Secondary | ICD-10-CM | POA: Insufficient documentation

## 2014-01-01 DIAGNOSIS — R112 Nausea with vomiting, unspecified: Secondary | ICD-10-CM | POA: Insufficient documentation

## 2014-01-01 DIAGNOSIS — Z8739 Personal history of other diseases of the musculoskeletal system and connective tissue: Secondary | ICD-10-CM | POA: Insufficient documentation

## 2014-01-01 DIAGNOSIS — Z8719 Personal history of other diseases of the digestive system: Secondary | ICD-10-CM | POA: Insufficient documentation

## 2014-01-01 DIAGNOSIS — F17200 Nicotine dependence, unspecified, uncomplicated: Secondary | ICD-10-CM | POA: Insufficient documentation

## 2014-01-01 DIAGNOSIS — R197 Diarrhea, unspecified: Secondary | ICD-10-CM | POA: Insufficient documentation

## 2014-01-01 DIAGNOSIS — Z8659 Personal history of other mental and behavioral disorders: Secondary | ICD-10-CM | POA: Insufficient documentation

## 2014-01-01 DIAGNOSIS — Z87448 Personal history of other diseases of urinary system: Secondary | ICD-10-CM | POA: Insufficient documentation

## 2014-01-01 LAB — URINE MICROSCOPIC-ADD ON

## 2014-01-01 LAB — CBC WITH DIFFERENTIAL/PLATELET
BASOS ABS: 0 10*3/uL (ref 0.0–0.1)
Basophils Relative: 0 % (ref 0–1)
EOS ABS: 0.2 10*3/uL (ref 0.0–0.7)
Eosinophils Relative: 2 % (ref 0–5)
HCT: 42.2 % (ref 36.0–46.0)
Hemoglobin: 13.9 g/dL (ref 12.0–15.0)
Lymphocytes Relative: 24 % (ref 12–46)
Lymphs Abs: 2.4 10*3/uL (ref 0.7–4.0)
MCH: 29.2 pg (ref 26.0–34.0)
MCHC: 32.9 g/dL (ref 30.0–36.0)
MCV: 88.7 fL (ref 78.0–100.0)
Monocytes Absolute: 0.4 10*3/uL (ref 0.1–1.0)
Monocytes Relative: 4 % (ref 3–12)
NEUTROS ABS: 7.3 10*3/uL (ref 1.7–7.7)
NEUTROS PCT: 70 % (ref 43–77)
Platelets: 260 10*3/uL (ref 150–400)
RBC: 4.76 MIL/uL (ref 3.87–5.11)
RDW: 14 % (ref 11.5–15.5)
WBC: 10.3 10*3/uL (ref 4.0–10.5)

## 2014-01-01 LAB — BASIC METABOLIC PANEL
ANION GAP: 13 (ref 5–15)
BUN: 12 mg/dL (ref 6–23)
CALCIUM: 9.8 mg/dL (ref 8.4–10.5)
CO2: 22 mEq/L (ref 19–32)
CREATININE: 0.82 mg/dL (ref 0.50–1.10)
Chloride: 106 mEq/L (ref 96–112)
GFR, EST NON AFRICAN AMERICAN: 88 mL/min — AB (ref >90)
Glucose, Bld: 106 mg/dL — ABNORMAL HIGH (ref 70–99)
Potassium: 4 mEq/L (ref 3.7–5.3)
Sodium: 141 mEq/L (ref 137–147)

## 2014-01-01 LAB — URINALYSIS, ROUTINE W REFLEX MICROSCOPIC
BILIRUBIN URINE: NEGATIVE
Glucose, UA: NEGATIVE mg/dL
Ketones, ur: NEGATIVE mg/dL
Leukocytes, UA: NEGATIVE
NITRITE: NEGATIVE
Protein, ur: NEGATIVE mg/dL
SPECIFIC GRAVITY, URINE: 1.023 (ref 1.005–1.030)
Urobilinogen, UA: 0.2 mg/dL (ref 0.0–1.0)
pH: 5.5 (ref 5.0–8.0)

## 2014-01-01 LAB — PROTIME-INR
INR: 1.09 (ref 0.00–1.49)
PROTHROMBIN TIME: 14.1 s (ref 11.6–15.2)

## 2014-01-01 LAB — APTT: APTT: 27 s (ref 24–37)

## 2014-01-01 MED ORDER — MORPHINE SULFATE 4 MG/ML IJ SOLN
4.0000 mg | Freq: Once | INTRAMUSCULAR | Status: AC
  Administered 2014-01-01: 4 mg via INTRAVENOUS
  Filled 2014-01-01: qty 1

## 2014-01-01 MED ORDER — ONDANSETRON HCL 4 MG PO TABS: 4.0000 mg | ORAL_TABLET | Freq: Four times a day (QID) | ORAL | Status: DC

## 2014-01-01 MED ORDER — SODIUM CHLORIDE 0.9 % IV SOLN
1000.0000 mL | Freq: Once | INTRAVENOUS | Status: AC
  Administered 2014-01-01: 1000 mL via INTRAVENOUS

## 2014-01-01 MED ORDER — ONDANSETRON HCL 4 MG/2ML IJ SOLN
4.0000 mg | Freq: Once | INTRAMUSCULAR | Status: AC
  Administered 2014-01-01: 4 mg via INTRAVENOUS
  Filled 2014-01-01: qty 2

## 2014-01-01 NOTE — ED Notes (Signed)
Attempt to collect occult blood card. No blood or stool present.

## 2014-01-01 NOTE — ED Provider Notes (Signed)
CSN: [id number]     Arrival date & time 01/01/14  0531 History   First MD Initiated Contact with Patient 01/01/14 [phone]     Chief Complaint  Patient presents with  . Nausea  . Emesis  . Rectal Bleeding     (Consider location/radiation/quality/duration/timing/severity/associated sxs/prior Treatment) HPI  38 year old female with history of endometriosis, hypertension, anxiety presents for evaluation of abdominal discomfort.  Patient reports for the past 2 days she has been having low abnormal pain. She described pain as cramping sensation, waxing and waning, with associate nausea vomiting and diarrhea. She has vomit a total of 3 times since yesterday. Vomitus is nonbloody nonbilious. She also report having persistent diarrhea since last night. She has been going to the bathroom every 30 minutes. Report having loose stools and having bloody stools well. Described as "chunky" mixed with mucous.  Report rectal pain, admits to having hemorrhoids but denies pain with wiping.  Endorse nausea, diaphoresis, and lightheadedness.  Denies fever, chills, cp, sob, productive cough, back pain, dysuria.  Not on blood thinner medication.  No prior hx of UC or Crohns disease.  No recent sick contact.    Past Medical History  Diagnosis Date  . Endometriosis   . Hypertension   . Tachycardia     History - neg cardiac tests-stress related  . SVD (spontaneous vaginal delivery)     x 2  . Anxiety     no current meds  . GERD (gastroesophageal reflux disease)     diet control - no meds  . Headache(784.0)     hx - last one 2 yrs ago - no meds  . Arthritis     left knee pain - otc med prn  . Dysrhythmia     Home with daughter [name]   Past Surgical History  Procedure Laterality Date  . Metatarsil       left foot surgery   . Dilation and curettage of uterus    . Induced abortion    . Laparoscopy  1992    endometriosis  . Eye surgery  06/28/13    right laser eye surgery - repair retina  .  Laparoscopy N/A 07/19/2013    Procedure: LAPAROSCOPY DIAGNOSTIC  with resection of endometriosis;  Surgeon: Marvene Staff, MD;  Location: [facility];  Service: Gynecology;  Laterality: N/A;  . Hysteroscopy w/d&c N/A 07/19/2013    Procedure: DILATATION AND CURETTAGE /HYSTEROSCOPY;  Surgeon: Marvene Staff, MD;  Location: [facility];  Service: Gynecology;  Laterality: N/A;   Family History  Problem Relation Age of Onset  . Diabetes Mother   . Hypertension Mother   . Hypertension Father   . Kidney disease Father   . Anesthesia problems Neg Hx   . Hypotension Neg Hx   . Malignant hyperthermia Neg Hx   . Pseudochol deficiency Neg Hx    History  Substance Use Topics  . Smoking status: Current Some Day Smoker -- 0.50 packs/day for 20 years    Types: Cigarettes  . Smokeless tobacco: Never Used  . Alcohol Use: No   OB History   Grav Para Term Preterm Abortions TAB SAB Ect Mult Living   6 2 2  0 4 2 2  0 0 2     Review of Systems  All other systems reviewed and are negative.     Allergies  Review of patient's allergies indicates no known allergies.  Home Medications   Prior to Admission medications   Medication Sig Start Date End Date  Taking? Authorizing Provider  acetaminophen (TYLENOL) 500 MG tablet Take 500 mg by mouth every 6 (six) hours as needed.   Yes Historical Provider, MD  valsartan-hydrochlorothiazide (DIOVAN-HCT) 160-12.5 MG per tablet Take 1 tablet by mouth daily.   Yes Historical Provider, MD   BP 128/85  Pulse 88  Temp(Src) 98 F (36.7 C) (Oral)  Resp 17  SpO2 98% Physical Exam  Nursing note and vitals reviewed. Constitutional: She appears well-developed and well-nourished. No distress.  HENT:  Head: Atraumatic.  Eyes: Conjunctivae are normal.  Neck: Neck supple.  Cardiovascular: Normal rate and regular rhythm.   Pulmonary/Chest: Effort normal and breath sounds normal.  Abdominal: Soft. Bowel sounds are normal. There is tenderness (lower abdominal  tenderness no palpation without guarding or rebound tenderness.  no hernia noted).  Neurological: She is alert.  Skin: No rash noted.  Psychiatric: She has a normal mood and affect.    ED Course  Procedures (including critical care time)  6:44 AM Pt with sxs suggestive of gastroenteritis.  Non surgical abdomen, does not appear toxic.  Work up initiated.    8:06 AM Patient felt much better after receiving IV fluid treatment. She is not actively vomiting or having any diarrhea. Her abdomen is soft and nontender on reexamination. Her labs are reassuring. No evidence of electrolytes abnormalities. No signs of anemia. I suspect her symptoms likely gastroenteritis which may have agitated her hemorrhoids causing it to bleed. I recommend patient to followup with her primary care Dr. Nevada Crane with GI for further evaluation. Return precautions discussed if the symptoms worsen.  Pt currently tolerates PO.  No rectal bleeding.   Labs Review Labs Reviewed  BASIC METABOLIC PANEL - Abnormal; Notable for the following:    Glucose, Bld 106 (*)    GFR calc non Af Amer 88 (*)    All other components within normal limits  URINALYSIS, ROUTINE W REFLEX MICROSCOPIC - Abnormal; Notable for the following:    Hgb urine dipstick SMALL (*)    All other components within normal limits  URINE MICROSCOPIC-ADD ON - Abnormal; Notable for the following:    Squamous Epithelial / LPF MANY (*)    Bacteria, UA FEW (*)    All other components within normal limits  CBC WITH DIFFERENTIAL  APTT  PROTIME-INR  POC OCCULT BLOOD, ED    Imaging Review No results found.   EKG Interpretation None      MDM   Final diagnoses:  Nausea vomiting and diarrhea    BP 112/76  Pulse 85  Temp(Src) 98 F (36.7 C) (Oral)  Resp 18  SpO2 98%  I have reviewed nursing notes and vital signs. I reviewed available ER/hospitalization records thought the EMR     Domenic Moras, [place] 01/01/14 0277

## 2014-01-01 NOTE — ED Provider Notes (Signed)
Medical screening examination/treatment/procedure(s) were performed by non-physician practitioner and as supervising physician I was immediately available for consultation/collaboration.   EKG Interpretation None       Journiee Feldkamp K Anila Bojarski-Rasch, MD 01/01/14 [phone]

## 2014-01-01 NOTE — ED Notes (Signed)
Pt drinking ginger ale at the time.

## 2014-01-01 NOTE — Discharge Instructions (Signed)
Return if you have fever, worsening abdominal pain or if you have other concerns. Follow up with GI specialist if your rectal bleeding is persistent.    Viral Gastroenteritis Viral gastroenteritis is also known as stomach flu. This condition affects the stomach and intestinal tract. It can cause sudden diarrhea and vomiting. The illness typically lasts 3 to 8 days. Most people develop an immune response that eventually gets rid of the virus. While this natural response develops, the virus can make you quite ill. CAUSES  Many different viruses can cause gastroenteritis, such as rotavirus or noroviruses. You can catch one of these viruses by consuming contaminated food or water. You may also catch a virus by sharing utensils or other personal items with an infected person or by touching a contaminated surface. SYMPTOMS  The most common symptoms are diarrhea and vomiting. These problems can cause a severe loss of body fluids (dehydration) and a body salt (electrolyte) imbalance. Other symptoms may include:  Fever.  Headache.  Fatigue.  Abdominal pain. DIAGNOSIS  Your caregiver can usually diagnose viral gastroenteritis based on your symptoms and a physical exam. A stool sample may also be taken to test for the presence of viruses or other infections. TREATMENT  This illness typically goes away on its own. Treatments are aimed at rehydration. The most serious cases of viral gastroenteritis involve vomiting so severely that you are not able to keep fluids down. In these cases, fluids must be given through an intravenous line (IV). HOME CARE INSTRUCTIONS   Drink enough fluids to keep your urine clear or pale yellow. Drink small amounts of fluids frequently and increase the amounts as tolerated.  Ask your caregiver for specific rehydration instructions.  Avoid:  Foods high in sugar.  Alcohol.  Carbonated drinks.  Tobacco.  Juice.  Caffeine drinks.  Extremely hot or cold  fluids.  Fatty, greasy foods.  Too much intake of anything at one time.  Dairy products until 24 to 48 hours after diarrhea stops.  You may consume probiotics. Probiotics are active cultures of beneficial bacteria. They may lessen the amount and number of diarrheal stools in adults. Probiotics can be found in yogurt with active cultures and in supplements.  Wash your hands well to avoid spreading the virus.  Only take over-the-counter or prescription medicines for pain, discomfort, or fever as directed by your caregiver. Do not give aspirin to children. Antidiarrheal medicines are not recommended.  Ask your caregiver if you should continue to take your regular prescribed and over-the-counter medicines.  Keep all follow-up appointments as directed by your caregiver. SEEK IMMEDIATE MEDICAL CARE IF:   You are unable to keep fluids down.  You do not urinate at least once every 6 to 8 hours.  You develop shortness of breath.  You notice blood in your stool or vomit. This may look like coffee grounds.  You have abdominal pain that increases or is concentrated in one small area (localized).  You have persistent vomiting or diarrhea.  You have a fever.  The patient is a child younger than 3 months, and he or she has a fever.  The patient is a child older than 3 months, and he or she has a fever and persistent symptoms.  The patient is a child older than 3 months, and he or she has a fever and symptoms suddenly get worse.  The patient is a baby, and he or she has no tears when crying. MAKE SURE YOU:   Understand these instructions.  Will watch your condition.  Will get help right away if you are not doing well or get worse. Document Released: 05/18/2005 Document Revised: 08/10/2011 Document Reviewed: 03/04/2011 [facility] Patient Information 2015 [place], [place]. This information is not intended to replace advice given to you by your health care provider. Make sure you discuss  any questions you have with your health care provider.

## 2014-01-01 NOTE — ED Notes (Signed)
Patient arrived via POV stating she has had nausea, vomiting and diarrhea x2 days. She then developed rectal bleeding with diaphoresis and dizziness last night. She has abdominal pain and states she feels like she has the urgency to keep having a bowel movement. VSS.

## 2014-01-24 ENCOUNTER — Encounter (HOSPITAL_COMMUNITY): Payer: Self-pay | Admitting: Emergency Medicine

## 2014-01-24 ENCOUNTER — Emergency Department (HOSPITAL_COMMUNITY)
Admission: EM | Admit: 2014-01-24 | Discharge: 2014-01-24 | Disposition: A | Discharge status: Home / Self Care | Payer: Federal, State, Local not specified - PPO | Attending: Emergency Medicine | Admitting: Emergency Medicine

## 2014-01-24 DIAGNOSIS — W260XXA Contact with knife, initial encounter: Secondary | ICD-10-CM | POA: Insufficient documentation

## 2014-01-24 DIAGNOSIS — Z8719 Personal history of other diseases of the digestive system: Secondary | ICD-10-CM | POA: Insufficient documentation

## 2014-01-24 DIAGNOSIS — M129 Arthropathy, unspecified: Secondary | ICD-10-CM | POA: Insufficient documentation

## 2014-01-24 DIAGNOSIS — Z23 Encounter for immunization: Secondary | ICD-10-CM | POA: Insufficient documentation

## 2014-01-24 DIAGNOSIS — Z8742 Personal history of other diseases of the female genital tract: Secondary | ICD-10-CM | POA: Diagnosis not present

## 2014-01-24 DIAGNOSIS — W261XXA Contact with sword or dagger, initial encounter: Secondary | ICD-10-CM | POA: Insufficient documentation

## 2014-01-24 DIAGNOSIS — F17200 Nicotine dependence, unspecified, uncomplicated: Secondary | ICD-10-CM | POA: Diagnosis not present

## 2014-01-24 DIAGNOSIS — S61209A Unspecified open wound of unspecified finger without damage to nail, initial encounter: Secondary | ICD-10-CM | POA: Diagnosis present

## 2014-01-24 DIAGNOSIS — Z79899 Other long term (current) drug therapy: Secondary | ICD-10-CM | POA: Diagnosis not present

## 2014-01-24 DIAGNOSIS — R000 Tachycardia, unspecified: Secondary | ICD-10-CM | POA: Diagnosis not present

## 2014-01-24 DIAGNOSIS — Y9289 Other specified places as the place of occurrence of the external cause: Secondary | ICD-10-CM | POA: Diagnosis not present

## 2014-01-24 DIAGNOSIS — Y9389 Activity, other specified: Secondary | ICD-10-CM | POA: Insufficient documentation

## 2014-01-24 DIAGNOSIS — Z8659 Personal history of other mental and behavioral disorders: Secondary | ICD-10-CM | POA: Diagnosis not present

## 2014-01-24 DIAGNOSIS — I10 Essential (primary) hypertension: Secondary | ICD-10-CM | POA: Insufficient documentation

## 2014-01-24 DIAGNOSIS — S61219A Laceration without foreign body of unspecified finger without damage to nail, initial encounter: Secondary | ICD-10-CM

## 2014-01-24 MED ORDER — TETANUS-DIPHTH-ACELL PERTUSSIS 5-2.5-18.5 LF-MCG/0.5 IM SUSP
0.5000 mL | Freq: Once | INTRAMUSCULAR | Status: AC
  Administered 2014-01-24: 0.5 mL via INTRAMUSCULAR
  Filled 2014-01-24: qty 0.5

## 2014-01-24 NOTE — ED Provider Notes (Signed)
CSN: [id number]     Arrival date & time 01/24/14  1415 History  This chart was scribed for non-physician practitioner, Hyman Bible, PA-C,working with Janice Norrie, MD, by Marlowe Kays, ED Scribe. This patient was seen in room WTR5/WTR5 and the patient's care was started at 3:00 PM.  Chief Complaint  Patient presents with  . Extremity Laceration   The history is provided by the patient. No language interpreter was used.   HPI Comments:  Stacey Stanley is a 38 y.o. female who presents to the Emergency Department complaining of a laceration to her left thumb that occurred while slicing meat approximately 18 hours ago. She reports wrapping the wound with gauze but states she has had a hard time controlling the bleeding. She denies numbness, tingling or inability to move the thumb. She states she has not had a tetanus vaccination within the past five years.   Past Medical History  Diagnosis Date  . Endometriosis   . Hypertension   . Tachycardia     History - neg cardiac tests-stress related  . SVD (spontaneous vaginal delivery)     x 2  . Anxiety     no current meds  . GERD (gastroesophageal reflux disease)     diet control - no meds  . Headache(784.0)     hx - last one 2 yrs ago - no meds  . Arthritis     left knee pain - otc med prn  . Dysrhythmia     Home with daughter [name]   Past Surgical History  Procedure Laterality Date  . Metatarsil       left foot surgery   . Dilation and curettage of uterus    . Induced abortion    . Laparoscopy  1992    endometriosis  . Eye surgery  06/28/13    right laser eye surgery - repair retina  . Laparoscopy N/A 07/19/2013    Procedure: LAPAROSCOPY DIAGNOSTIC  with resection of endometriosis;  Surgeon: Marvene Staff, MD;  Location: [facility];  Service: Gynecology;  Laterality: N/A;  . Hysteroscopy w/d&c N/A 07/19/2013    Procedure: DILATATION AND CURETTAGE /HYSTEROSCOPY;  Surgeon: Marvene Staff, MD;  Location: [facility];   Service: Gynecology;  Laterality: N/A;   Family History  Problem Relation Age of Onset  . Diabetes Mother   . Hypertension Mother   . Hypertension Father   . Kidney disease Father   . Anesthesia problems Neg Hx   . Hypotension Neg Hx   . Malignant hyperthermia Neg Hx   . Pseudochol deficiency Neg Hx    History  Substance Use Topics  . Smoking status: Current Some Day Smoker -- 0.50 packs/day for 20 years    Types: Cigarettes  . Smokeless tobacco: Never Used  . Alcohol Use: No   OB History   Grav Para Term Preterm Abortions TAB SAB Ect Mult Living   6 2 2  0 4 2 2  0 0 2     Review of Systems  Skin: Positive for wound.  Neurological: Negative for weakness and numbness.  Hematological: Does not bruise/bleed easily.    Allergies  Review of patient's allergies indicates no known allergies.  Home Medications   Prior to Admission medications   Medication Sig Start Date End Date Taking? Authorizing Provider  acetaminophen (TYLENOL) 500 MG tablet Take 500 mg by mouth every 6 (six) hours as needed.    Historical Provider, MD  ondansetron (ZOFRAN) 4 MG tablet Take 1  tablet (4 mg total) by mouth every 6 (six) hours. 01/01/14   Domenic Moras, PA-C  valsartan-hydrochlorothiazide (DIOVAN-HCT) 160-12.5 MG per tablet Take 1 tablet by mouth daily.    Historical Provider, MD   Triage Vitals: BP 137/90  Pulse 97  Temp(Src) 98.6 F (37 C) (Oral)  Resp 16  SpO2 100% Physical Exam  Nursing note and vitals reviewed. Constitutional: She is oriented to person, place, and time. She appears well-developed and well-nourished.  HENT:  Head: Normocephalic and atraumatic.  Eyes: EOM are normal.  Neck: Normal range of motion.  Cardiovascular: Normal rate, regular rhythm and normal heart sounds.  Exam reveals no gallop and no friction rub.   No murmur heard. Pulmonary/Chest: Effort normal and breath sounds normal. No respiratory distress. She has no wheezes. She has no rales.  Musculoskeletal:  Normal range of motion.  Neurological: She is alert and oriented to person, place, and time.  Skin: Skin is warm and dry.  5 mm non gaping linear laceration along ulnar dorsal aspect of the left thumb lateral to the nail. No nail bed involvement. Bleeding controlled at this time. Full ROM of the left thumb and hand.  Psychiatric: She has a normal mood and affect. Her behavior is normal.    ED Course  Procedures (including critical care time) DIAGNOSTIC STUDIES: Oxygen Saturation is 100% on RA, normal by my interpretation.   COORDINATION OF CARE: 3:05 PM- Will irrigate wound and redress. Will update tetanus. Pt verbalizes understanding and agrees to plan.  Medications  Tdap (BOOSTRIX) injection 0.5 mL (not administered)    Labs Review Labs Reviewed - No data to display  Imaging Review No results found.   EKG Interpretation None      MDM   Final diagnoses:  None   Patient presenting with a laceration to the thumb that occurred yesterday.  Laceration is non gaping.  No signs of infection.  Laceration not repaired due to the fact that it occurred 18 hours ago and is small and non gaping.  Tetanus updated.  Wound irrigated.  Patient stable for discharge.  Return precautions given.  I personally performed the services described in this documentation, which was scribed in my presence. The recorded information has been reviewed and is accurate.    Hyman Bible, PA-C 01/24/14 1640

## 2014-01-24 NOTE — ED Notes (Signed)
Pt c/o L thumb laceration since last night.  Pain score 6/10.  Pt reports she was cutting some meat when the knife slipped.

## 2014-01-24 NOTE — ED Provider Notes (Signed)
Medical screening examination/treatment/procedure(s) were performed by non-physician practitioner and as supervising physician I was immediately available for consultation/collaboration.   EKG Interpretation None        Pamella Pert, MD 01/24/14 [phone]

## 2014-01-30 LAB — HM MAMMOGRAPHY: HM Mammogram: NORMAL

## 2014-04-02 ENCOUNTER — Encounter (HOSPITAL_COMMUNITY): Payer: Self-pay | Admitting: Emergency Medicine

## 2014-09-30 ENCOUNTER — Encounter (HOSPITAL_COMMUNITY): Payer: Self-pay | Admitting: Emergency Medicine

## 2014-09-30 ENCOUNTER — Emergency Department (HOSPITAL_COMMUNITY)
Admission: EM | Admit: 2014-09-30 | Discharge: 2014-09-30 | Disposition: A | Discharge status: Home / Self Care | Payer: Federal, State, Local not specified - PPO | Attending: Emergency Medicine | Admitting: Emergency Medicine

## 2014-09-30 DIAGNOSIS — Z720 Tobacco use: Secondary | ICD-10-CM | POA: Insufficient documentation

## 2014-09-30 DIAGNOSIS — E119 Type 2 diabetes mellitus without complications: Secondary | ICD-10-CM | POA: Insufficient documentation

## 2014-09-30 DIAGNOSIS — Z8659 Personal history of other mental and behavioral disorders: Secondary | ICD-10-CM | POA: Diagnosis not present

## 2014-09-30 DIAGNOSIS — R42 Dizziness and giddiness: Secondary | ICD-10-CM | POA: Insufficient documentation

## 2014-09-30 DIAGNOSIS — R197 Diarrhea, unspecified: Secondary | ICD-10-CM

## 2014-09-30 DIAGNOSIS — R112 Nausea with vomiting, unspecified: Secondary | ICD-10-CM

## 2014-09-30 DIAGNOSIS — K529 Noninfective gastroenteritis and colitis, unspecified: Secondary | ICD-10-CM

## 2014-09-30 DIAGNOSIS — Z79899 Other long term (current) drug therapy: Secondary | ICD-10-CM | POA: Insufficient documentation

## 2014-09-30 DIAGNOSIS — I10 Essential (primary) hypertension: Secondary | ICD-10-CM | POA: Insufficient documentation

## 2014-09-30 DIAGNOSIS — Z8742 Personal history of other diseases of the female genital tract: Secondary | ICD-10-CM | POA: Diagnosis not present

## 2014-09-30 DIAGNOSIS — Z8739 Personal history of other diseases of the musculoskeletal system and connective tissue: Secondary | ICD-10-CM | POA: Diagnosis not present

## 2014-09-30 HISTORY — DX: Type 2 diabetes mellitus without complications: E11.9

## 2014-09-30 LAB — CBC
HEMATOCRIT: 45.2 % (ref 36.0–46.0)
Hemoglobin: 15.3 g/dL — ABNORMAL HIGH (ref 12.0–15.0)
MCH: 29 pg (ref 26.0–34.0)
MCHC: 33.8 g/dL (ref 30.0–36.0)
MCV: 85.8 fL (ref 78.0–100.0)
Platelets: 276 10*3/uL (ref 150–400)
RBC: 5.27 MIL/uL — ABNORMAL HIGH (ref 3.87–5.11)
RDW: 13.1 % (ref 11.5–15.5)
WBC: 8 10*3/uL (ref 4.0–10.5)

## 2014-09-30 LAB — COMPREHENSIVE METABOLIC PANEL
ALBUMIN: 4.2 g/dL (ref 3.5–5.0)
ALT: 14 U/L (ref 14–54)
AST: 18 U/L (ref 15–41)
Alkaline Phosphatase: 51 U/L (ref 38–126)
Anion gap: 9 (ref 5–15)
BUN: 6 mg/dL (ref 6–20)
CALCIUM: 9.9 mg/dL (ref 8.9–10.3)
CO2: 24 mmol/L (ref 22–32)
CREATININE: 0.96 mg/dL (ref 0.44–1.00)
Chloride: 106 mmol/L (ref 101–111)
GFR calc Af Amer: >60 mL/min (ref >60)
GFR calc non Af Amer: >60 mL/min (ref >60)
Glucose, Bld: 105 mg/dL — ABNORMAL HIGH (ref 70–99)
Potassium: 3.4 mmol/L — ABNORMAL LOW (ref 3.5–5.1)
SODIUM: 139 mmol/L (ref 135–145)
TOTAL PROTEIN: 6.8 g/dL (ref 6.5–8.1)
Total Bilirubin: 1.1 mg/dL (ref 0.3–1.2)

## 2014-09-30 LAB — LIPASE, BLOOD: Lipase: 25 U/L (ref 22–51)

## 2014-09-30 MED ORDER — ONDANSETRON 8 MG PO TBDP: 8.0000 mg | ORAL_TABLET | Freq: Three times a day (TID) | ORAL | Status: DC | PRN

## 2014-09-30 MED ORDER — SODIUM CHLORIDE 0.9 % IV BOLUS (SEPSIS)
1000.0000 mL | Freq: Once | INTRAVENOUS | Status: AC
  Administered 2014-09-30: 1000 mL via INTRAVENOUS

## 2014-09-30 MED ORDER — POTASSIUM CHLORIDE CRYS ER 20 MEQ PO TBCR
20.0000 meq | EXTENDED_RELEASE_TABLET | Freq: Once | ORAL | Status: AC
  Administered 2014-09-30: 20 meq via ORAL
  Filled 2014-09-30: qty 1

## 2014-09-30 MED ORDER — MORPHINE SULFATE 4 MG/ML IJ SOLN
4.0000 mg | Freq: Once | INTRAMUSCULAR | Status: AC
  Administered 2014-09-30: 4 mg via INTRAVENOUS
  Filled 2014-09-30: qty 1

## 2014-09-30 MED ORDER — GI COCKTAIL ~~LOC~~
30.0000 mL | Freq: Once | ORAL | Status: AC
  Administered 2014-09-30: 30 mL via ORAL
  Filled 2014-09-30: qty 30

## 2014-09-30 MED ORDER — ONDANSETRON HCL 4 MG/2ML IJ SOLN
4.0000 mg | Freq: Once | INTRAMUSCULAR | Status: AC
  Administered 2014-09-30: 4 mg via INTRAVENOUS
  Filled 2014-09-30: qty 2

## 2014-09-30 MED ORDER — PANTOPRAZOLE SODIUM 40 MG IV SOLR
40.0000 mg | Freq: Once | INTRAVENOUS | Status: AC
  Administered 2014-09-30: 40 mg via INTRAVENOUS
  Filled 2014-09-30: qty 40

## 2014-09-30 MED ORDER — FAMOTIDINE 20 MG PO TABS
20.0000 mg | ORAL_TABLET | Freq: Once | ORAL | Status: AC
  Administered 2014-09-30: 20 mg via ORAL
  Filled 2014-09-30: qty 1

## 2014-09-30 NOTE — ED Notes (Signed)
PT experiencing nausea and vomiting and epigastric pain that did not go away. Woke up and pain still there and had diarr. And continued vomiting episodes. Can't hold anything down. Vomiting "cola color"

## 2014-09-30 NOTE — ED Provider Notes (Signed)
CSN: [id number]     Arrival date & time 09/30/14  0807 History   First MD Initiated Contact with Patient 09/30/14 [phone]     Chief Complaint  Patient presents with  . Diarrhea  . Dizziness     (Consider location/radiation/quality/duration/timing/severity/associated sxs/prior Treatment) Patient is a 39 y.o. female presenting with diarrhea and dizziness. The history is provided by the patient.  Diarrhea Associated symptoms: abdominal pain and vomiting   Associated symptoms: no chills, no fever and no headaches   Dizziness Associated symptoms: diarrhea, nausea and vomiting   Associated symptoms: no chest pain, no headaches and no shortness of breath   Patient c/o nausea/vomiting onset yesterday, few episodes, emesis not bloody or bilious, this morning w several episodes watery diarrhea, not bloody diarrhea. Epigastric pain/cramping, dull, moderate, non radiating. No back or flank pain. No hx pud. +hx gerd. No abd distension. No constant/focal abd pain. No mid to lower abd pain, no pelvic pain. No vaginal discharge or bleeding. Pt denies cough or uri c/o. No sob. No chest pain or discomfort. No fever or chills. No known bad food ingestion, recent travel, ill contacts, or abx use.      Past Medical History  Diagnosis Date  . Endometriosis   . Hypertension   . Tachycardia     History - neg cardiac tests-stress related  . SVD (spontaneous vaginal delivery)     x 2  . Anxiety     no current meds  . GERD (gastroesophageal reflux disease)     diet control - no meds  . Headache(784.0)     hx - last one 2 yrs ago - no meds  . Arthritis     left knee pain - otc med prn  . Dysrhythmia     Home with daughter [name]  . Diabetes mellitus without complication    Past Surgical History  Procedure Laterality Date  . Metatarsil       left foot surgery   . Dilation and curettage of uterus    . Induced abortion    . Laparoscopy  1992    endometriosis  . Eye surgery  06/28/13    right  laser eye surgery - repair retina  . Laparoscopy N/A 07/19/2013    Procedure: LAPAROSCOPY DIAGNOSTIC  with resection of endometriosis;  Surgeon: Marvene Staff, MD;  Location: [facility];  Service: Gynecology;  Laterality: N/A;  . Hysteroscopy w/d&c N/A 07/19/2013    Procedure: DILATATION AND CURETTAGE /HYSTEROSCOPY;  Surgeon: Marvene Staff, MD;  Location: [facility];  Service: Gynecology;  Laterality: N/A;   Family History  Problem Relation Age of Onset  . Diabetes Mother   . Hypertension Mother   . Hypertension Father   . Kidney disease Father   . Anesthesia problems Neg Hx   . Hypotension Neg Hx   . Malignant hyperthermia Neg Hx   . Pseudochol deficiency Neg Hx    History  Substance Use Topics  . Smoking status: Current Some Day Smoker -- 0.50 packs/day for 20 years    Types: Cigarettes  . Smokeless tobacco: Never Used  . Alcohol Use: No   OB History    Gravida Para Term Preterm AB TAB SAB Ectopic Multiple Living   6 2 2  0 4 2 2  0 0 2     Review of Systems  Constitutional: Negative for fever and chills.  HENT: Negative for sore throat.   Eyes: Negative for redness.  Respiratory: Negative for cough and shortness of  breath.   Cardiovascular: Negative for chest pain.  Gastrointestinal: Positive for nausea, vomiting, abdominal pain and diarrhea.  Endocrine: Negative for polyuria.  Genitourinary: Negative for dysuria, flank pain, vaginal bleeding and vaginal discharge.  Musculoskeletal: Negative for back pain and neck pain.  Skin: Negative for rash.  Neurological: Positive for dizziness. Negative for headaches.  Hematological: Does not bruise/bleed easily.  Psychiatric/Behavioral: Negative for confusion.      Allergies  Review of patient's allergies indicates no known allergies.  Home Medications   Prior to Admission medications   Medication Sig Start Date End Date Taking? Authorizing Provider  valsartan-hydrochlorothiazide (DIOVAN-HCT) 160-12.5 MG per tablet  Take 1 tablet by mouth daily.    Historical Provider, MD   BP 137/90 mmHg  Pulse 98  Temp(Src) 97.9 F (36.6 C) (Oral)  Resp 18  SpO2 98%  LMP 09/07/2014 Physical Exam  Constitutional: She appears well-developed and well-nourished. No distress.  HENT:  Mouth/Throat: Oropharynx is clear and moist.  Eyes: Conjunctivae are normal. No scleral icterus.  Neck: Neck supple. No tracheal deviation present.  Cardiovascular: Normal rate, regular rhythm, normal heart sounds and intact distal pulses.  Exam reveals no gallop and no friction rub.   No murmur heard. Pulmonary/Chest: Effort normal and breath sounds normal. No respiratory distress.  Abdominal: Soft. Normal appearance and bowel sounds are normal. She exhibits no distension and no mass. There is tenderness. There is no rebound and no guarding.  Epigastric tenderness, no rebound or guarding.   Genitourinary:  No cva tenderness.   Musculoskeletal: She exhibits no edema or tenderness.  Neurological: She is alert.  Skin: Skin is warm and dry. No rash noted. She is not diaphoretic.  Psychiatric: She has a normal mood and affect.  Nursing note and vitals reviewed.   ED Course  Procedures (including critical care time) Labs Review   Results for orders placed or performed during the hospital encounter of 09/30/14  CBC  Result Value Ref Range   WBC 8.0 4.0 - 10.5 K/uL   RBC 5.27 (H) 3.87 - 5.11 MIL/uL   Hemoglobin 15.3 (H) 12.0 - 15.0 g/dL   HCT 45.2 36.0 - 46.0 %   MCV 85.8 78.0 - 100.0 fL   MCH 29.0 26.0 - 34.0 pg   MCHC 33.8 30.0 - 36.0 g/dL   RDW 13.1 11.5 - 15.5 %   Platelets 276 150 - 400 K/uL  Comprehensive metabolic panel  Result Value Ref Range   Sodium 139 135 - 145 mmol/L   Potassium 3.4 (L) 3.5 - 5.1 mmol/L   Chloride 106 101 - 111 mmol/L   CO2 24 22 - 32 mmol/L   Glucose, Bld 105 (H) 70 - 99 mg/dL   BUN 6 6 - 20 mg/dL   Creatinine, Ser 0.96 0.44 - 1.00 mg/dL   Calcium 9.9 8.9 - 10.3 mg/dL   Total Protein 6.8  6.5 - 8.1 g/dL   Albumin 4.2 3.5 - 5.0 g/dL   AST 18 15 - 41 U/L   ALT 14 14 - 54 U/L   Alkaline Phosphatase 51 38 - 126 U/L   Total Bilirubin 1.1 0.3 - 1.2 mg/dL   GFR calc non Af Amer >60 >60 mL/min   GFR calc Af Amer >60 >60 mL/min   Anion gap 9 5 - 15  Lipase, blood  Result Value Ref Range   Lipase 25 22 - 51 U/L    EKG Interpretation  Date/Time:  Sunday Sep 30 2014 11:50:22 EDT Ventricular Rate:  70 PR Interval:  151 QRS Duration: 84 QT Interval:  403 QTC Calculation: 435 R Axis:   50 Text Interpretation:  Sinus rhythm Normal ECG No significant change since last tracing Confirmed by Gwendelyn Lanting  MD, Lennette Bihari ([id number]) on 09/30/2014 12:01:00 PM         MDM   Iv ns bolus. protonix iv. zofran iv. Morphine iv.  Lab down for several hrs, pt updated on delay.  Reviewed nursing notes and prior charts for additional history.   Recheck symptoms improved, not yet resolved.  ecg unchanged from prior. Labs unremarkable x sl low k.   kcl po.  Po fluids.  No recurrent nvd in ED.  Recheck abd soft nt.   Pt feels improved, and currently appears stable for d/c.   Return precautions provided.      Lajean Saver, MD 09/30/14 [phone]

## 2014-09-30 NOTE — Discharge Instructions (Signed)
It was our pleasure to provide your ER care today - we hope that you feel better.  Rest. Drink plenty of fluids.  You may try pepcid and maalox as need for symptom relief.  You may take zofran as need for nausea/vomiting.   Take imodium as need if diarrhea.  Follow up with primary care doctor in the next 1-2 days for recheck if symptoms fail to improve/resolve.  Return to ER right away if worse, new symptoms, fevers, persistent vomiting, worsening or severe abdominal pain, other concern.  You were given pain medication in the ER - no driving for the next 4 hours.   Nausea and Vomiting Nausea is a sick feeling that often comes before throwing up (vomiting). Vomiting is a reflex where stomach contents come out of your mouth. Vomiting can cause severe loss of body fluids (dehydration). Children and elderly adults can become dehydrated quickly, especially if they also have diarrhea. Nausea and vomiting are symptoms of a condition or disease. It is important to find the cause of your symptoms. CAUSES   Direct irritation of the stomach lining. This irritation can result from increased acid production (gastroesophageal reflux disease), infection, food poisoning, taking certain medicines (such as nonsteroidal anti-inflammatory drugs), alcohol use, or tobacco use.  Signals from the brain.These signals could be caused by a headache, heat exposure, an inner ear disturbance, increased pressure in the brain from injury, infection, a tumor, or a concussion, pain, emotional stimulus, or metabolic problems.  An obstruction in the gastrointestinal tract (bowel obstruction).  Illnesses such as diabetes, hepatitis, gallbladder problems, appendicitis, kidney problems, cancer, sepsis, atypical symptoms of a heart attack, or eating disorders.  Medical treatments such as chemotherapy and radiation.  Receiving medicine that makes you sleep (general anesthetic) during surgery. DIAGNOSIS Your caregiver may  ask for tests to be done if the problems do not improve after a few days. Tests may also be done if symptoms are severe or if the reason for the nausea and vomiting is not clear. Tests may include:  Urine tests.  Blood tests.  Stool tests.  Cultures (to look for evidence of infection).  X-rays or other imaging studies. Test results can help your caregiver make decisions about treatment or the need for additional tests. TREATMENT You need to stay well hydrated. Drink frequently but in small amounts.You may wish to drink water, sports drinks, clear broth, or eat frozen ice pops or gelatin dessert to help stay hydrated.When you eat, eating slowly may help prevent nausea.There are also some antinausea medicines that may help prevent nausea. HOME CARE INSTRUCTIONS   Take all medicine as directed by your caregiver.  If you do not have an appetite, do not force yourself to eat. However, you must continue to drink fluids.  If you have an appetite, eat a normal diet unless your caregiver tells you differently.  Eat a variety of complex carbohydrates (rice, wheat, potatoes, bread), lean meats, yogurt, fruits, and vegetables.  Avoid high-fat foods because they are more difficult to digest.  Drink enough water and fluids to keep your urine clear or pale yellow.  If you are dehydrated, ask your caregiver for specific rehydration instructions. Signs of dehydration may include:  Severe thirst.  Dry lips and mouth.  Dizziness.  Dark urine.  Decreasing urine frequency and amount.  Confusion.  Rapid breathing or pulse. SEEK IMMEDIATE MEDICAL CARE IF:   You have blood or brown flecks (like coffee grounds) in your vomit.  You have black or bloody stools.  You have a severe headache or stiff neck.  You are confused.  You have severe abdominal pain.  You have chest pain or trouble breathing.  You do not urinate at least once every 8 hours.  You develop cold or clammy  skin.  You continue to vomit for longer than 24 to 48 hours.  You have a fever. MAKE SURE YOU:   Understand these instructions.  Will watch your condition.  Will get help right away if you are not doing well or get worse. Document Released: 05/18/2005 Document Revised: 08/10/2011 Document Reviewed: 10/15/2010 [facility] Patient Information 2015 [place], [place]. This information is not intended to replace advice given to you by your health care provider. Make sure you discuss any questions you have with your health care provider.     Diarrhea Diarrhea is frequent loose and watery bowel movements. It can cause you to feel weak and dehydrated. Dehydration can cause you to become tired and thirsty, have a dry mouth, and have decreased urination that often is dark yellow. Diarrhea is a sign of another problem, most often an infection that will not last long. In most cases, diarrhea typically lasts 2-3 days. However, it can last longer if it is a sign of something more serious. It is important to treat your diarrhea as directed by your caregiver to lessen or prevent future episodes of diarrhea. CAUSES  Some common causes include:  Gastrointestinal infections caused by viruses, bacteria, or parasites.  Food poisoning or food allergies.  Certain medicines, such as antibiotics, chemotherapy, and laxatives.  Artificial sweeteners and fructose.  Digestive disorders. HOME CARE INSTRUCTIONS  Ensure adequate fluid intake (hydration): Have 1 cup (8 oz) of fluid for each diarrhea episode. Avoid fluids that contain simple sugars or sports drinks, fruit juices, whole milk products, and sodas. Your urine should be clear or pale yellow if you are drinking enough fluids. Hydrate with an oral rehydration solution that you can purchase at pharmacies, retail stores, and online. You can prepare an oral rehydration solution at home by mixing the following ingredients together:   - tsp table salt.   tsp  baking soda.   tsp salt substitute containing potassium chloride.  1  tablespoons sugar.  1 L (34 oz) of water.  Certain foods and beverages may increase the speed at which food moves through the gastrointestinal (GI) tract. These foods and beverages should be avoided and include:  Caffeinated and alcoholic beverages.  High-fiber foods, such as raw fruits and vegetables, nuts, seeds, and whole grain breads and cereals.  Foods and beverages sweetened with sugar alcohols, such as xylitol, sorbitol, and mannitol.  Some foods may be well tolerated and may help thicken stool including:  Starchy foods, such as rice, toast, pasta, low-sugar cereal, oatmeal, grits, baked potatoes, crackers, and bagels.  Bananas.  Applesauce.  Add probiotic-rich foods to help increase healthy bacteria in the GI tract, such as yogurt and fermented milk products.  Wash your hands well after each diarrhea episode.  Only take over-the-counter or prescription medicines as directed by your caregiver.  Take a warm bath to relieve any burning or pain from frequent diarrhea episodes. SEEK IMMEDIATE MEDICAL CARE IF:   You are unable to keep fluids down.  You have persistent vomiting.  You have blood in your stool, or your stools are black and tarry.  You do not urinate in 6-8 hours, or there is only a small amount of very dark urine.  You have abdominal pain that increases or  localizes.  You have weakness, dizziness, confusion, or light-headedness.  You have a severe headache.  Your diarrhea gets worse or does not get better.  You have a fever or persistent symptoms for more than 2-3 days.  You have a fever and your symptoms suddenly get worse. MAKE SURE YOU:   Understand these instructions.  Will watch your condition.  Will get help right away if you are not doing well or get worse. Document Released: 05/08/2002 Document Revised: 10/02/2013 Document Reviewed: 01/24/2012 [facility] Patient  Information 2015 [place], [place]. This information is not intended to replace advice given to you by your health care provider. Make sure you discuss any questions you have with your health care provider.    Viral Gastroenteritis Viral gastroenteritis is also known as stomach flu. This condition affects the stomach and intestinal tract. It can cause sudden diarrhea and vomiting. The illness typically lasts 3 to 8 days. Most people develop an immune response that eventually gets rid of the virus. While this natural response develops, the virus can make you quite ill. CAUSES  Many different viruses can cause gastroenteritis, such as rotavirus or noroviruses. You can catch one of these viruses by consuming contaminated food or water. You may also catch a virus by sharing utensils or other personal items with an infected person or by touching a contaminated surface. SYMPTOMS  The most common symptoms are diarrhea and vomiting. These problems can cause a severe loss of body fluids (dehydration) and a body salt (electrolyte) imbalance. Other symptoms may include:  Fever.  Headache.  Fatigue.  Abdominal pain. DIAGNOSIS  Your caregiver can usually diagnose viral gastroenteritis based on your symptoms and a physical exam. A stool sample may also be taken to test for the presence of viruses or other infections. TREATMENT  This illness typically goes away on its own. Treatments are aimed at rehydration. The most serious cases of viral gastroenteritis involve vomiting so severely that you are not able to keep fluids down. In these cases, fluids must be given through an intravenous line (IV). HOME CARE INSTRUCTIONS   Drink enough fluids to keep your urine clear or pale yellow. Drink small amounts of fluids frequently and increase the amounts as tolerated.  Ask your caregiver for specific rehydration instructions.  Avoid:  Foods high in sugar.  Alcohol.  Carbonated  drinks.  Tobacco.  Juice.  Caffeine drinks.  Extremely hot or cold fluids.  Fatty, greasy foods.  Too much intake of anything at one time.  Dairy products until 24 to 48 hours after diarrhea stops.  You may consume probiotics. Probiotics are active cultures of beneficial bacteria. They may lessen the amount and number of diarrheal stools in adults. Probiotics can be found in yogurt with active cultures and in supplements.  Wash your hands well to avoid spreading the virus.  Only take over-the-counter or prescription medicines for pain, discomfort, or fever as directed by your caregiver. Do not give aspirin to children. Antidiarrheal medicines are not recommended.  Ask your caregiver if you should continue to take your regular prescribed and over-the-counter medicines.  Keep all follow-up appointments as directed by your caregiver. SEEK IMMEDIATE MEDICAL CARE IF:   You are unable to keep fluids down.  You do not urinate at least once every 6 to 8 hours.  You develop shortness of breath.  You notice blood in your stool or vomit. This may look like coffee grounds.  You have abdominal pain that increases or is concentrated in one small  area (localized).  You have persistent vomiting or diarrhea.  You have a fever.  The patient is a child younger than 3 months, and he or she has a fever.  The patient is a child older than 3 months, and he or she has a fever and persistent symptoms.  The patient is a child older than 3 months, and he or she has a fever and symptoms suddenly get worse.  The patient is a baby, and he or she has no tears when crying. MAKE SURE YOU:   Understand these instructions.  Will watch your condition.  Will get help right away if you are not doing well or get worse. Document Released: 05/18/2005 Document Revised: 08/10/2011 Document Reviewed: 03/04/2011 [facility] Patient Information 2015 [place], [place]. This information is not intended to replace  advice given to you by your health care provider. Make sure you discuss any questions you have with your health care provider.   Gastritis, Adult Gastritis is soreness and swelling (inflammation) of the lining of the stomach. Gastritis can develop as a sudden onset (acute) or long-term (chronic) condition. If gastritis is not treated, it can lead to stomach bleeding and ulcers. CAUSES  Gastritis occurs when the stomach lining is weak or damaged. Digestive juices from the stomach then inflame the weakened stomach lining. The stomach lining may be weak or damaged due to viral or bacterial infections. One common bacterial infection is the Helicobacter pylori infection. Gastritis can also result from excessive alcohol consumption, taking certain medicines, or having too much acid in the stomach.  SYMPTOMS  In some cases, there are no symptoms. When symptoms are present, they may include:  Pain or a burning sensation in the upper abdomen.  Nausea.  Vomiting.  An uncomfortable feeling of fullness after eating. DIAGNOSIS  Your caregiver may suspect you have gastritis based on your symptoms and a physical exam. To determine the cause of your gastritis, your caregiver may perform the following:  Blood or stool tests to check for the H pylori bacterium.  Gastroscopy. A thin, flexible tube (endoscope) is passed down the esophagus and into the stomach. The endoscope has a light and camera on the end. Your caregiver uses the endoscope to view the inside of the stomach.  Taking a tissue sample (biopsy) from the stomach to examine under a microscope. TREATMENT  Depending on the cause of your gastritis, medicines may be prescribed. If you have a bacterial infection, such as an H pylori infection, antibiotics may be given. If your gastritis is caused by too much acid in the stomach, H2 blockers or antacids may be given. Your caregiver may recommend that you stop taking aspirin, ibuprofen, or other  nonsteroidal anti-inflammatory drugs (NSAIDs). HOME CARE INSTRUCTIONS  Only take over-the-counter or prescription medicines as directed by your caregiver.  If you were given antibiotic medicines, take them as directed. Finish them even if you start to feel better.  Drink enough fluids to keep your urine clear or pale yellow.  Avoid foods and drinks that make your symptoms worse, such as:  Caffeine or alcoholic drinks.  Chocolate.  Peppermint or mint flavorings.  Garlic and onions.  Spicy foods.  Citrus fruits, such as oranges, lemons, or limes.  Tomato-based foods such as sauce, chili, salsa, and pizza.  Fried and fatty foods.  Eat small, frequent meals instead of large meals. SEEK IMMEDIATE MEDICAL CARE IF:   You have black or dark red stools.  You vomit blood or material that looks like coffee  grounds.  You are unable to keep fluids down.  Your abdominal pain gets worse.  You have a fever.  You do not feel better after 1 week.  You have any other questions or concerns. MAKE SURE YOU:  Understand these instructions.  Will watch your condition.  Will get help right away if you are not doing well or get worse. Document Released: 05/12/2001 Document Revised: 11/17/2011 Document Reviewed: 07/01/2011 [facility] Patient Information 2015 [place], [place]. This information is not intended to replace advice given to you by your health care provider. Make sure you discuss any questions you have with your health care provider.

## 2014-11-06 ENCOUNTER — Ambulatory Visit: Payer: Self-pay | Admitting: Family Medicine

## 2014-11-25 ENCOUNTER — Encounter: Payer: Self-pay | Admitting: Family Medicine

## 2014-11-25 DIAGNOSIS — E8881 Metabolic syndrome: Secondary | ICD-10-CM | POA: Insufficient documentation

## 2014-11-25 DIAGNOSIS — F320 Major depressive disorder, single episode, mild: Secondary | ICD-10-CM | POA: Insufficient documentation

## 2014-11-25 DIAGNOSIS — I10 Essential (primary) hypertension: Secondary | ICD-10-CM | POA: Insufficient documentation

## 2014-11-25 DIAGNOSIS — A630 Anogenital (venereal) warts: Secondary | ICD-10-CM | POA: Insufficient documentation

## 2014-11-25 DIAGNOSIS — K219 Gastro-esophageal reflux disease without esophagitis: Secondary | ICD-10-CM | POA: Insufficient documentation

## 2014-11-25 DIAGNOSIS — N809 Endometriosis, unspecified: Secondary | ICD-10-CM | POA: Insufficient documentation

## 2014-11-25 DIAGNOSIS — E669 Obesity, unspecified: Secondary | ICD-10-CM | POA: Insufficient documentation

## 2014-11-25 DIAGNOSIS — G4700 Insomnia, unspecified: Secondary | ICD-10-CM | POA: Insufficient documentation

## 2014-11-26 ENCOUNTER — Ambulatory Visit (INDEPENDENT_AMBULATORY_CARE_PROVIDER_SITE_OTHER): Payer: Federal, State, Local not specified - PPO | Admitting: Family Medicine

## 2014-11-26 ENCOUNTER — Ambulatory Visit (INDEPENDENT_AMBULATORY_CARE_PROVIDER_SITE_OTHER): Payer: Federal, State, Local not specified - PPO

## 2014-11-26 ENCOUNTER — Ambulatory Visit (INDEPENDENT_AMBULATORY_CARE_PROVIDER_SITE_OTHER): Payer: Federal, State, Local not specified - PPO | Admitting: Podiatry

## 2014-11-26 ENCOUNTER — Encounter: Payer: Self-pay | Admitting: Family Medicine

## 2014-11-26 ENCOUNTER — Encounter: Payer: Self-pay | Admitting: Podiatry

## 2014-11-26 VITALS — BP 133/78 | HR 82 | Resp 15

## 2014-11-26 VITALS — BP 118/84 | HR 92 | Temp 98.4°F | Resp 16 | Ht 67.0 in | Wt 199.3 lb

## 2014-11-26 DIAGNOSIS — Z79899 Other long term (current) drug therapy: Secondary | ICD-10-CM | POA: Diagnosis not present

## 2014-11-26 DIAGNOSIS — E8881 Metabolic syndrome: Secondary | ICD-10-CM

## 2014-11-26 DIAGNOSIS — M779 Enthesopathy, unspecified: Secondary | ICD-10-CM | POA: Diagnosis not present

## 2014-11-26 DIAGNOSIS — G4700 Insomnia, unspecified: Secondary | ICD-10-CM | POA: Diagnosis not present

## 2014-11-26 DIAGNOSIS — F418 Other specified anxiety disorders: Secondary | ICD-10-CM

## 2014-11-26 DIAGNOSIS — L84 Corns and callosities: Secondary | ICD-10-CM

## 2014-11-26 DIAGNOSIS — F32A Depression, unspecified: Secondary | ICD-10-CM

## 2014-11-26 DIAGNOSIS — E669 Obesity, unspecified: Secondary | ICD-10-CM

## 2014-11-26 DIAGNOSIS — M2012 Hallux valgus (acquired), left foot: Secondary | ICD-10-CM

## 2014-11-26 DIAGNOSIS — M21612 Bunion of left foot: Secondary | ICD-10-CM

## 2014-11-26 DIAGNOSIS — F329 Major depressive disorder, single episode, unspecified: Secondary | ICD-10-CM

## 2014-11-26 DIAGNOSIS — Z13220 Encounter for screening for lipoid disorders: Secondary | ICD-10-CM | POA: Diagnosis not present

## 2014-11-26 DIAGNOSIS — Z720 Tobacco use: Secondary | ICD-10-CM | POA: Diagnosis not present

## 2014-11-26 DIAGNOSIS — R5383 Other fatigue: Secondary | ICD-10-CM

## 2014-11-26 DIAGNOSIS — F419 Anxiety disorder, unspecified: Secondary | ICD-10-CM

## 2014-11-26 MED ORDER — TRIAMCINOLONE ACETONIDE 10 MG/ML IJ SUSP: 10.0000 mg | Freq: Once | INTRAMUSCULAR | Status: DC

## 2014-11-26 MED ORDER — METFORMIN HCL 500 MG PO TABS: 500.0000 mg | ORAL_TABLET | Freq: Two times a day (BID) | ORAL | Status: DC

## 2014-11-26 MED ORDER — TEMAZEPAM 15 MG PO CAPS: 15.0000 mg | ORAL_CAPSULE | Freq: Every evening | ORAL | Status: DC | PRN

## 2014-11-26 MED ORDER — BUPROPION HCL ER (XL) 150 MG PO TB24: 150.0000 mg | ORAL_TABLET | Freq: Every day | ORAL | Status: DC

## 2014-11-26 NOTE — Patient Instructions (Signed)
Nicotine Addiction Nicotine can act as both a stimulant (excites/activates) and a sedative (calms/quiets). Immediately after exposure to nicotine, there is a "kick" caused in part by the drug's stimulation of the adrenal glands and resulting discharge of adrenaline (epinephrine). The rush of adrenaline stimulates the body and causes a sudden release of sugar. This means that smokers are always slightly hyperglycemic. Hyperglycemic means that the blood sugar is high, just like in diabetics. Nicotine also decreases the amount of insulin which helps control sugar levels in the body. There is an increase in blood pressure, breathing, and the rate of heart beats.  In addition, nicotine indirectly causes a release of dopamine in the brain that controls pleasure and motivation. A similar reaction is seen with other drugs of abuse, such as cocaine and heroin. This dopamine release is thought to cause the pleasurable sensations when smoking. In some different cases, nicotine can also create a calming effect, depending on sensitivity of the smoker's nervous system and the dose of nicotine taken. WHAT HAPPENS WHEN NICOTINE IS TAKEN FOR LONG PERIODS OF TIME?  Long-term use of nicotine results in addiction. It is difficult to stop.  Repeated use of nicotine creates tolerance. Higher doses of nicotine are needed to get the "kick." When nicotine use is stopped, withdrawal may last a month or more. Withdrawal may begin within a few hours after the last cigarette. Symptoms peak within the first few days and may lessen within a few weeks. For some people, however, symptoms may last for months or longer. Withdrawal symptoms include:   Irritability.  Craving.  Learning and attention deficits.  Sleep disturbances.  Increased appetite. Craving for tobacco may last for 6 months or longer. Many behaviors done while using nicotine can also play a part in the severity of withdrawal symptoms. For some people, the feel,  smell, and sight of a cigarette and the ritual of obtaining, handling, lighting, and smoking the cigarette are closely linked with the pleasure of smoking. When stopped, they also miss the related behaviors which make the withdrawal or craving worse. While nicotine gum and patches may lessen the drug aspects of withdrawal, cravings often persist. WHAT ARE THE MEDICAL CONSEQUENCES OF NICOTINE USE?  Nicotine addiction accounts for one-third of all cancers. The top cancer caused by tobacco is lung cancer. Lung cancer is the number one cancer killer of both men and women.  Smoking is also associated with cancers of the:  Mouth.  Pharynx.  Larynx.  Esophagus.  Stomach.  Pancreas.  Cervix.  Kidney.  Ureter.  Bladder.  Smoking also causes lung diseases such as lasting (chronic) bronchitis and emphysema.  It worsens asthma in adults and children.  Smoking increases the risk of heart disease, including:  Stroke.  Heart attack.  Vascular disease.  Aneurysm.  Passive or secondary smoke can also increase medical risks including:  Asthma in children.  Sudden Infant Death Syndrome (SIDS).  Additionally, dropped cigarettes are the leading cause of residential fire fatalities.  Nicotine poisoning has been reported from accidental ingestion of tobacco products by children and pets. Death usually results in a few minutes from respiratory failure (when a person stops breathing) caused by paralysis. TREATMENT   Medication. Nicotine replacement medicines such as nicotine gum and the patch are used to stop smoking. These medicines gradually lower the dosage of nicotine in the body. These medicines do not contain the carbon monoxide and other toxins found in tobacco smoke.  Hypnotherapy.  Relaxation therapy.  Nicotine Anonymous (a 12-step support   program). Find times and locations in your local yellow pages. Document Released: 01/22/2004 Document Revised: 08/10/2011 Document  Reviewed: 07/14/2013 [employer] Patient Information 2015 [employer]. This information is not intended to replace advice given to you by your health care provider. Make sure you discuss any questions you have with your health care provider.  
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
## 2014-11-26 NOTE — Progress Notes (Signed)
Subjective:     Patient ID: Stacey Stanley, female   DOB: January 11, 1976, 39 y.o.   MRN: [id number]  HPI   Review of Systems  Constitutional: Positive for fatigue and unexpected weight change.  Musculoskeletal: Positive for back pain and arthralgias.  All other systems reviewed and are negative.    Objective:  Physical Exam   Assessment:    Plan:

## 2014-11-27 ENCOUNTER — Telehealth: Payer: Self-pay

## 2014-11-27 LAB — VITAMIN B12: Vitamin B-12: 285 pg/mL (ref 211–946)

## 2014-11-27 LAB — LIPID PANEL
Chol/HDL Ratio: 2.3 ratio units (ref 0.0–4.4)
Cholesterol, Total: 128 mg/dL (ref 100–199)
HDL: 56 mg/dL (ref >39)
LDL Calculated: 62 mg/dL (ref 0–99)
Triglycerides: 48 mg/dL (ref 0–149)
VLDL Cholesterol Cal: 10 mg/dL (ref 5–40)

## 2014-11-27 LAB — HEMOGLOBIN A1C
ESTIMATED AVERAGE GLUCOSE: 120 mg/dL
HEMOGLOBIN A1C: 5.8 % — AB (ref 4.8–5.6)

## 2014-11-27 LAB — TSH: TSH: 0.748 u[IU]/mL (ref 0.450–4.500)

## 2014-11-27 LAB — VITAMIN D 25 HYDROXY (VIT D DEFICIENCY, FRACTURES): VIT D 25 HYDROXY: 27.5 ng/mL — AB (ref 30.0–100.0)

## 2014-11-27 NOTE — Telephone Encounter (Signed)
-----   Message from Steele Sizer, MD sent at 11/27/2014  8:21 AM EDT ----- Lipid panel within normal limits HbgA1C shows she has pre diabetes, and needs to follow a low carb diet and increase physical activity Vitamin D is okay, can take otc vitamin D 2000units daily to get it above 30 B12 is low, it may be part of her fatigue, she can take otc SL vitamin B12 1000 mcg daily or come in monthly for B12 injections Please notify patient, thank you

## 2014-11-27 NOTE — Progress Notes (Signed)
Subjective:     Patient ID: Stacey Stanley, female   DOB: December 26, 1975, 39 y.o.   MRN: [id number]  HPI patient presents stating I developed pain over the last 3 months in the plantar of my left foot and states that she had surgery by me approximate 13 years ago for the callus and it did great until just a few months ago   Review of Systems  All other systems reviewed and are negative.      Objective:   Physical Exam  Constitutional: She is oriented to person, place, and time.  Cardiovascular: Intact distal pulses.   Musculoskeletal: Normal range of motion.  Neurological: She is oriented to person, place, and time.  Skin: Skin is warm.  Nursing note and vitals reviewed.  neurovascular status intact muscle strength adequate range of motion within normal limits. Patient is noted to have plantar flexed fifth metatarsal left with keratotic lesion and fluid buildup underneath the metatarsal itself with a scar on the dorsum of the foot that's healed well and patient's noted to have good digital perfusion and is well oriented 3     Assessment:     Plantarflexed metatarsal with inflamed capsule fifth metatarsal left with lesion formation    Plan:     H&P and x-rays reviewed with patient. Today I went ahead and did a sub-capsular injection 3 mg Kenalog 5 mg Xylocaine and after appropriate numbness did complete debridement of lesion with padding. Patient will be seen back when symptomatically and may ultimately require osteotomy or metatarsal head resection

## 2014-11-27 NOTE — Progress Notes (Signed)
Patient notified of labs.   

## 2014-11-27 NOTE — Telephone Encounter (Signed)
Left vm for patient to return my call for results.

## 2014-12-24 ENCOUNTER — Other Ambulatory Visit: Payer: Self-pay | Admitting: Family Medicine

## 2014-12-24 DIAGNOSIS — F32A Depression, unspecified: Secondary | ICD-10-CM

## 2014-12-24 DIAGNOSIS — F329 Major depressive disorder, single episode, unspecified: Secondary | ICD-10-CM

## 2014-12-24 DIAGNOSIS — F419 Anxiety disorder, unspecified: Secondary | ICD-10-CM

## 2014-12-24 NOTE — Telephone Encounter (Signed)
Pt is completely out of wellbutrin. She has upcoming appointment for August however she is completely out. Is it possible to call in a prescription to [facility]. Erling Conte

## 2014-12-24 NOTE — Telephone Encounter (Signed)
Patient requesting refill. 

## 2014-12-25 MED ORDER — BUPROPION HCL ER (XL) 150 MG PO TB24: 150.0000 mg | ORAL_TABLET | Freq: Every day | ORAL | Status: DC

## 2014-12-25 NOTE — Telephone Encounter (Signed)
Patient checking status on med refill

## 2015-01-07 ENCOUNTER — Encounter: Payer: Self-pay | Admitting: Family Medicine

## 2015-01-07 ENCOUNTER — Ambulatory Visit (INDEPENDENT_AMBULATORY_CARE_PROVIDER_SITE_OTHER): Payer: Federal, State, Local not specified - PPO | Admitting: Family Medicine

## 2015-01-07 VITALS — BP 112/80 | HR 89 | Temp 98.0°F | Resp 14 | Ht 67.0 in | Wt 199.3 lb

## 2015-01-07 DIAGNOSIS — F418 Other specified anxiety disorders: Secondary | ICD-10-CM

## 2015-01-07 DIAGNOSIS — L509 Urticaria, unspecified: Secondary | ICD-10-CM

## 2015-01-07 DIAGNOSIS — F419 Anxiety disorder, unspecified: Secondary | ICD-10-CM

## 2015-01-07 DIAGNOSIS — F329 Major depressive disorder, single episode, unspecified: Secondary | ICD-10-CM

## 2015-01-07 DIAGNOSIS — G4700 Insomnia, unspecified: Secondary | ICD-10-CM

## 2015-01-07 DIAGNOSIS — F32A Depression, unspecified: Secondary | ICD-10-CM

## 2015-01-07 MED ORDER — RANITIDINE HCL 150 MG PO TABS: 150.0000 mg | ORAL_TABLET | Freq: Two times a day (BID) | ORAL | Status: DC

## 2015-01-07 MED ORDER — BUPROPION HCL ER (XL) 300 MG PO TB24: 300.0000 mg | ORAL_TABLET | Freq: Every day | ORAL | Status: DC

## 2015-01-07 MED ORDER — CETIRIZINE HCL 10 MG PO TABS: 10.0000 mg | ORAL_TABLET | Freq: Two times a day (BID) | ORAL | Status: DC

## 2015-01-07 MED ORDER — HYDROXYZINE HCL 10 MG PO TABS: 10.0000 mg | ORAL_TABLET | Freq: Three times a day (TID) | ORAL | Status: DC | PRN

## 2015-01-07 MED ORDER — TEMAZEPAM 15 MG PO CAPS: 15.0000 mg | ORAL_CAPSULE | Freq: Every evening | ORAL | Status: DC | PRN
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
## 2015-01-23 ENCOUNTER — Emergency Department (HOSPITAL_COMMUNITY)
Admission: EM | Admit: 2015-01-23 | Discharge: 2015-01-23 | Disposition: A | Discharge status: Home / Self Care | Payer: Federal, State, Local not specified - PPO | Attending: Emergency Medicine | Admitting: Emergency Medicine

## 2015-01-23 ENCOUNTER — Encounter (HOSPITAL_COMMUNITY): Payer: Self-pay | Admitting: Emergency Medicine

## 2015-01-23 DIAGNOSIS — R000 Tachycardia, unspecified: Secondary | ICD-10-CM | POA: Diagnosis not present

## 2015-01-23 DIAGNOSIS — R51 Headache: Secondary | ICD-10-CM | POA: Insufficient documentation

## 2015-01-23 DIAGNOSIS — Z8742 Personal history of other diseases of the female genital tract: Secondary | ICD-10-CM | POA: Insufficient documentation

## 2015-01-23 DIAGNOSIS — F419 Anxiety disorder, unspecified: Secondary | ICD-10-CM | POA: Diagnosis not present

## 2015-01-23 DIAGNOSIS — Z79899 Other long term (current) drug therapy: Secondary | ICD-10-CM | POA: Diagnosis not present

## 2015-01-23 DIAGNOSIS — Z720 Tobacco use: Secondary | ICD-10-CM | POA: Diagnosis not present

## 2015-01-23 DIAGNOSIS — IMO0001 Reserved for inherently not codable concepts without codable children: Secondary | ICD-10-CM

## 2015-01-23 DIAGNOSIS — E119 Type 2 diabetes mellitus without complications: Secondary | ICD-10-CM | POA: Diagnosis not present

## 2015-01-23 DIAGNOSIS — R030 Elevated blood-pressure reading, without diagnosis of hypertension: Secondary | ICD-10-CM

## 2015-01-23 DIAGNOSIS — Z3202 Encounter for pregnancy test, result negative: Secondary | ICD-10-CM | POA: Diagnosis not present

## 2015-01-23 DIAGNOSIS — I10 Essential (primary) hypertension: Secondary | ICD-10-CM | POA: Insufficient documentation

## 2015-01-23 DIAGNOSIS — R110 Nausea: Secondary | ICD-10-CM | POA: Insufficient documentation

## 2015-01-23 DIAGNOSIS — E669 Obesity, unspecified: Secondary | ICD-10-CM | POA: Insufficient documentation

## 2015-01-23 DIAGNOSIS — K219 Gastro-esophageal reflux disease without esophagitis: Secondary | ICD-10-CM | POA: Diagnosis not present

## 2015-01-23 DIAGNOSIS — E010 Iodine-deficiency related diffuse (endemic) goiter: Secondary | ICD-10-CM | POA: Insufficient documentation

## 2015-01-23 DIAGNOSIS — F329 Major depressive disorder, single episode, unspecified: Secondary | ICD-10-CM | POA: Insufficient documentation

## 2015-01-23 DIAGNOSIS — G4700 Insomnia, unspecified: Secondary | ICD-10-CM | POA: Insufficient documentation

## 2015-01-23 DIAGNOSIS — R519 Headache, unspecified: Secondary | ICD-10-CM

## 2015-01-23 LAB — CBC WITH DIFFERENTIAL/PLATELET
BASOS ABS: 0 10*3/uL (ref 0.0–0.1)
Basophils Relative: 0 % (ref 0–1)
Eosinophils Absolute: 0.2 10*3/uL (ref 0.0–0.7)
Eosinophils Relative: 2 % (ref 0–5)
HEMATOCRIT: 44.3 % (ref 36.0–46.0)
Hemoglobin: 14.9 g/dL (ref 12.0–15.0)
LYMPHS PCT: 40 % (ref 12–46)
Lymphs Abs: 2.9 10*3/uL (ref 0.7–4.0)
MCH: 29.2 pg (ref 26.0–34.0)
MCHC: 33.6 g/dL (ref 30.0–36.0)
MCV: 86.7 fL (ref 78.0–100.0)
MONO ABS: 0.3 10*3/uL (ref 0.1–1.0)
Monocytes Relative: 4 % (ref 3–12)
NEUTROS ABS: 3.8 10*3/uL (ref 1.7–7.7)
Neutrophils Relative %: 54 % (ref 43–77)
Platelets: 281 10*3/uL (ref 150–400)
RBC: 5.11 MIL/uL (ref 3.87–5.11)
RDW: 13 % (ref 11.5–15.5)
WBC: 7.2 10*3/uL (ref 4.0–10.5)

## 2015-01-23 LAB — URINALYSIS, ROUTINE W REFLEX MICROSCOPIC
Bilirubin Urine: NEGATIVE
GLUCOSE, UA: NEGATIVE mg/dL
KETONES UR: NEGATIVE mg/dL
Leukocytes, UA: NEGATIVE
Nitrite: NEGATIVE
PROTEIN: NEGATIVE mg/dL
Specific Gravity, Urine: 1.012 (ref 1.005–1.030)
Urobilinogen, UA: 0.2 mg/dL (ref 0.0–1.0)
pH: 6.5 (ref 5.0–8.0)

## 2015-01-23 LAB — COMPREHENSIVE METABOLIC PANEL
ALT: 17 U/L (ref 14–54)
AST: 18 U/L (ref 15–41)
Albumin: 4.4 g/dL (ref 3.5–5.0)
Alkaline Phosphatase: 52 U/L (ref 38–126)
Anion gap: 5 (ref 5–15)
BILIRUBIN TOTAL: 0.8 mg/dL (ref 0.3–1.2)
BUN: 8 mg/dL (ref 6–20)
CO2: 23 mmol/L (ref 22–32)
CREATININE: 0.88 mg/dL (ref 0.44–1.00)
Calcium: 9.8 mg/dL (ref 8.9–10.3)
Chloride: 110 mmol/L (ref 101–111)
GFR calc Af Amer: >60 mL/min (ref >60)
Glucose, Bld: 98 mg/dL (ref 65–99)
Potassium: 3.8 mmol/L (ref 3.5–5.1)
Sodium: 138 mmol/L (ref 135–145)
TOTAL PROTEIN: 7.6 g/dL (ref 6.5–8.1)

## 2015-01-23 LAB — URINE MICROSCOPIC-ADD ON

## 2015-01-23 LAB — PREGNANCY, URINE: Preg Test, Ur: NEGATIVE

## 2015-01-23 LAB — TSH: TSH: 1.117 u[IU]/mL (ref 0.350–4.500)

## 2015-01-23 MED ORDER — METHYLPREDNISOLONE SODIUM SUCC 125 MG IJ SOLR
125.0000 mg | Freq: Once | INTRAMUSCULAR | Status: AC
Start: 2015-01-23 — End: 2015-01-23
  Administered 2015-01-23: 125 mg via INTRAVENOUS
  Filled 2015-01-23: qty 2

## 2015-01-23 MED ORDER — MORPHINE SULFATE (PF) 4 MG/ML IV SOLN
4.0000 mg | Freq: Once | INTRAVENOUS | Status: AC
  Administered 2015-01-23: 4 mg via INTRAVENOUS
  Filled 2015-01-23: qty 1

## 2015-01-23 MED ORDER — KETOROLAC TROMETHAMINE 30 MG/ML IJ SOLN
30.0000 mg | Freq: Once | INTRAMUSCULAR | Status: AC
  Administered 2015-01-23: 30 mg via INTRAVENOUS
  Filled 2015-01-23: qty 1

## 2015-01-23 MED ORDER — METOCLOPRAMIDE HCL 10 MG PO TABS: 10.0000 mg | ORAL_TABLET | Freq: Four times a day (QID) | ORAL | Status: DC | PRN

## 2015-01-23 MED ORDER — METOCLOPRAMIDE HCL 5 MG/ML IJ SOLN
10.0000 mg | Freq: Once | INTRAMUSCULAR | Status: AC
  Administered 2015-01-23: 10 mg via INTRAVENOUS
  Filled 2015-01-23: qty 2

## 2015-01-23 MED ORDER — DIPHENHYDRAMINE HCL 50 MG/ML IJ SOLN
25.0000 mg | Freq: Once | INTRAMUSCULAR | Status: AC
  Administered 2015-01-23: 25 mg via INTRAVENOUS
  Filled 2015-01-23: qty 1

## 2015-01-23 NOTE — ED Notes (Signed)
Pt states she had a headache all day Tuesday and has not been able to get rid of it  Pt states she has tried advil, tramadol, etc without relief  Pt states it feels like a tight pressure and she is dizzy and nauseated  Pt has hx of HTN and used to take medication but has been off the medication for a long time now

## 2015-01-23 NOTE — Discharge Instructions (Signed)
Hypertension °Hypertension, commonly called high blood pressure, is when the force of blood pumping through your arteries is too strong. Your arteries are the blood vessels that carry blood from your heart throughout your body. A blood pressure reading consists of a higher number over a lower number, such as 110/72. The higher number (systolic) is the pressure inside your arteries when your heart pumps. The lower number (diastolic) is the pressure inside your arteries when your heart relaxes. Ideally you want your blood pressure below 120/80. °Hypertension forces your heart to work harder to pump blood. Your arteries may become narrow or stiff. Having hypertension puts you at risk for heart disease, stroke, and other problems.  °RISK FACTORS °Some risk factors for high blood pressure are controllable. Others are not.  °Risk factors you cannot control include:  °· Race. You may be at higher risk if you are African American. °· Age. Risk increases with age. °· Gender. Men are at higher risk than women before age 45 years. After age 65, women are at higher risk than men. °Risk factors you can control include: °· Not getting enough exercise or physical activity. °· Being overweight. °· Getting too much fat, sugar, calories, or salt in your diet. °· Drinking too much alcohol. °SIGNS AND SYMPTOMS °Hypertension does not usually cause signs or symptoms. Extremely high blood pressure (hypertensive crisis) may cause headache, anxiety, shortness of breath, and nosebleed. °DIAGNOSIS  °To check if you have hypertension, your health care provider will measure your blood pressure while you are seated, with your arm held at the level of your heart. It should be measured at least twice using the same arm. Certain conditions can cause a difference in blood pressure between your right and left arms. A blood pressure reading that is higher than normal on one occasion does not mean that you need treatment. If one blood pressure reading  is high, ask your health care provider about having it checked again. °TREATMENT  °Treating high blood pressure includes making lifestyle changes and possibly taking medicine. Living a healthy lifestyle can help lower high blood pressure. You may need to change some of your habits. °Lifestyle changes may include: °· Following the DASH diet. This diet is high in fruits, vegetables, and whole grains. It is low in salt, red meat, and added sugars. °· Getting at least 2½ hours of brisk physical activity every week. °· Losing weight if necessary. °· Not smoking. °· Limiting alcoholic beverages. °· Learning ways to reduce stress. ° If lifestyle changes are not enough to get your blood pressure under control, your health care provider may prescribe medicine. You may need to take more than one. Work closely with your health care provider to understand the risks and benefits. °HOME CARE INSTRUCTIONS °· Have your blood pressure rechecked as directed by your health care provider.   °· Take medicines only as directed by your health care provider. Follow the directions carefully. Blood pressure medicines must be taken as prescribed. The medicine does not work as well when you skip doses. Skipping doses also puts you at risk for problems.   °· Do not smoke.   °· Monitor your blood pressure at home as directed by your health care provider.  °SEEK MEDICAL CARE IF:  °· You think you are having a reaction to medicines taken. °· You have recurrent headaches or feel dizzy. °· You have swelling in your ankles. °· You have trouble with your vision. °SEEK IMMEDIATE MEDICAL CARE IF: °· You develop a severe headache or confusion. °·   You have unusual weakness, numbness, or feel faint. °· You have severe chest or abdominal pain. °· You vomit repeatedly. °· You have trouble breathing. °MAKE SURE YOU:  °· Understand these instructions. °· Will watch your condition. °· Will get help right away if you are not doing well or get worse. °Document  Released: 05/18/2005 Document Revised: 10/02/2013 Document Reviewed: 03/10/2013 °[employer]® Patient Information ©2015 [employer]. This information is not intended to replace advice given to you by your health care provider. Make sure you discuss any questions you have with your health care provider. °General Headache Without Cause °A headache is pain or discomfort felt around the head or neck area. The specific cause of a headache may not be found. There are many causes and types of headaches. A few common ones are: °· Tension headaches. °· Migraine headaches. °· Cluster headaches. °· Chronic daily headaches. °HOME CARE INSTRUCTIONS  °· Keep all follow-up appointments with your caregiver or any specialist referral. °· Only take over-the-counter or prescription medicines for pain or discomfort as directed by your caregiver. °· Lie down in a dark, quiet room when you have a headache. °· Keep a headache journal to find out what may trigger your migraine headaches. For example, write down: °· What you eat and drink. °· How much sleep you get. °· Any change to your diet or medicines. °· Try massage or other relaxation techniques. °· Put ice packs or heat on the head and neck. Use these 3 to 4 times per day for 15 to 20 minutes each time, or as needed. °· Limit stress. °· Sit up straight, and do not tense your muscles. °· Quit smoking if you smoke. °· Limit alcohol use. °· Decrease the amount of caffeine you drink, or stop drinking caffeine. °· Eat and sleep on a regular schedule. °· Get 7 to 9 hours of sleep, or as recommended by your caregiver. °· Keep lights dim if bright lights bother you and make your headaches worse. °SEEK MEDICAL CARE IF:  °· You have problems with the medicines you were prescribed. °· Your medicines are not working. °· You have a change from the usual headache. °· You have nausea or vomiting. °SEEK IMMEDIATE MEDICAL CARE IF:  °· Your headache becomes severe. °· You have a fever. °· You have a  stiff neck. °· You have loss of vision. °· You have muscular weakness or loss of muscle control. °· You start losing your balance or have trouble walking. °· You feel faint or pass out. °· You have severe symptoms that are different from your first symptoms. °MAKE SURE YOU:  °· Understand these instructions. °· Will watch your condition. °· Will get help right away if you are not doing well or get worse. °Document Released: 05/18/2005 Document Revised: 08/10/2011 Document Reviewed: 06/03/2011 °[employer]® Patient Information ©2015 [employer]. This information is not intended to replace advice given to you by your health care provider. Make sure you discuss any questions you have with your health care provider. ° °

## 2015-01-23 NOTE — ED Provider Notes (Signed)
CSN: [id number]   Arrival date & time 01/23/15 0057  History  This chart was scribed for Julianne Rice, MD by Altamease Oiler, ED Scribe. This patient was seen in room WA25/WA25 and the patient's care was started at 2:23 AM.  Chief Complaint  Patient presents with  . Headache    HPI The history is provided by the patient. No language interpreter was used.   Stacey Stanley is a 39 y.o. female with PMHx of HTN and headaches who presents to the Emergency Department complaining of a constant posterior headache with onset around 12 PM yesterday while at school. She describes the pain as pressure and rates it 5/10 in severity at present (8/10 at worse). 800 mg of ibuprofen and Tramadol provided insufficient pain relief PTA. She had a similar headache last week that resolved spontaneously. Associated symptoms include light headedness and nausea. Pt denies vomiting, blurred vision, photophobia, change in strength or sensation, fever, runny nose, and cough. No recent trauma or increased stress. She used to take an antihypertensive but it was discontinued a long time ago.   Past Medical History  Diagnosis Date  . Endometriosis   . Hypertension   . Tachycardia     History - neg cardiac tests-stress related  . SVD (spontaneous vaginal delivery)     x 2  . Anxiety     no current meds  . GERD (gastroesophageal reflux disease)     diet control - no meds  . Headache(784.0)     hx - last one 2 yrs ago - no meds  . Arthritis     left knee pain - otc med prn  . Dysrhythmia     Home with daughter [name]  . Diabetes mellitus without complication   . Chronic insomnia   . Serum calcium elevated   . Depression   . Obesity   . Metabolic syndrome   . Sleep disorder, circadian, shift work type     Past Surgical History  Procedure Laterality Date  . Metatarsil       left foot surgery   . Dilation and curettage of uterus    . Induced abortion    . Laparoscopy  1992    endometriosis  .  Eye surgery  06/28/13    right laser eye surgery - repair retina  . Laparoscopy N/A 07/19/2013    Procedure: LAPAROSCOPY DIAGNOSTIC  with resection of endometriosis;  Surgeon: Marvene Staff, MD;  Location: [facility];  Service: Gynecology;  Laterality: N/A;  . Hysteroscopy w/d&c N/A 07/19/2013    Procedure: DILATATION AND CURETTAGE /HYSTEROSCOPY;  Surgeon: Marvene Staff, MD;  Location: [facility];  Service: Gynecology;  Laterality: N/A;    Family History  Problem Relation Age of Onset  . Diabetes Mother   . Hypertension Mother   . Hypertension Father   . Kidney disease Father   . Anesthesia problems Neg Hx   . Hypotension Neg Hx   . Malignant hyperthermia Neg Hx   . Pseudochol deficiency Neg Hx   . Heart disease Brother     Social History  Substance Use Topics  . Smoking status: Current Every Day Smoker -- 0.50 packs/day for 21 years    Types: Cigarettes    Start date: 11/25/1993  . Smokeless tobacco: Never Used  . Alcohol Use: No     Review of Systems  Constitutional: Negative for fever, chills, diaphoresis and fatigue.  HENT: Negative for congestion and sinus pressure.   Eyes: Negative for  visual disturbance.  Respiratory: Negative for shortness of breath.   Cardiovascular: Negative for chest pain.  Gastrointestinal: Positive for nausea. Negative for vomiting and abdominal pain.  Musculoskeletal: Negative for myalgias, back pain, neck pain and neck stiffness.  Skin: Negative for rash and wound.  Neurological: Positive for dizziness, light-headedness and headaches. Negative for syncope, weakness and numbness.  All other systems reviewed and are negative.   Home Medications   Prior to Admission medications   Medication Sig Start Date End Date Taking? Authorizing Provider  buPROPion (WELLBUTRIN XL) 300 MG 24 hr tablet Take 1 tablet (300 mg total) by mouth daily. 01/07/15  Yes Steele Sizer, MD  cetirizine (ZYRTEC) 10 MG tablet Take 1 tablet (10 mg total) by mouth 2  (two) times daily. 01/07/15  Yes Steele Sizer, MD  hydrOXYzine (ATARAX/VISTARIL) 10 MG tablet Take 1 tablet (10 mg total) by mouth 3 (three) times daily as needed for itching (hives). 01/07/15  Yes Steele Sizer, MD  ibuprofen (ADVIL,MOTRIN) 200 MG tablet Take 800 mg by mouth every 4 (four) hours as needed for fever, headache, mild pain, moderate pain or cramping.   Yes Historical Provider, MD  medroxyPROGESTERone (DEPO-PROVERA) 150 MG/ML injection Inject 150 mg into the muscle every 3 (three) months.   Yes Historical Provider, MD  metFORMIN (GLUCOPHAGE) 500 MG tablet Take 1 tablet (500 mg total) by mouth 2 (two) times daily. 11/26/14  Yes Steele Sizer, MD  ranitidine (ZANTAC) 150 MG tablet Take 1 tablet (150 mg total) by mouth 2 (two) times daily. 01/07/15  Yes Steele Sizer, MD  temazepam (RESTORIL) 15 MG capsule Take 1 capsule (15 mg total) by mouth at bedtime as needed for sleep. 01/07/15  Yes Steele Sizer, MD    Allergies  Review of patient's allergies indicates no known allergies.  Triage Vitals: BP 169/103 mmHg  Pulse 112  Temp(Src) 98 F (36.7 C) (Oral)  Resp 18  SpO2 100%  LMP 01/15/2015 (Exact Date)  Physical Exam  Constitutional: She is oriented to person, place, and time. She appears well-developed and well-nourished. No distress.  HENT:  Head: Normocephalic and atraumatic.  Mouth/Throat: Oropharynx is clear and moist. No oropharyngeal exudate.  No sinus tenderness to percussion. No temporal or occipital tenderness  Eyes: EOM are normal. Pupils are equal, round, and reactive to light.  Mild exophthalmosis  Neck: Normal range of motion. Neck supple. Thyromegaly (mildly prominent thyroid) present.  No meningismus.   Cardiovascular: Regular rhythm.   Tachycardia  Pulmonary/Chest: Effort normal and breath sounds normal. No respiratory distress. She has no wheezes. She has no rales. She exhibits no tenderness.  Abdominal: Soft. Bowel sounds are normal. She exhibits no  distension and no mass. There is no tenderness. There is no rebound and no guarding.  Musculoskeletal: Normal range of motion. She exhibits no edema or tenderness.  Neurological: She is alert and oriented to person, place, and time.  Patient is alert and oriented x3 with clear, goal oriented speech. Patient has 5/5 motor in all extremities. Sensation is intact to light touch. Bilateral finger-to-nose is normal with no signs of dysmetria. Patient has a normal gait and walks without assistance.  Skin: Skin is warm and dry. No rash noted. No erythema.  Psychiatric: She has a normal mood and affect. Her behavior is normal.  Nursing note and vitals reviewed.   ED Course  Procedures   DIAGNOSTIC STUDIES: Oxygen Saturation is 100% on RA, normal by my interpretation.    COORDINATION OF CARE: 2:27 AM Discussed treatment  plan which includes lab work and pain management with pt at bedside and pt agreed to plan.  Labs Reviewed  URINALYSIS, ROUTINE W REFLEX MICROSCOPIC (NOT AT [facility]) - Abnormal; Notable for the following:    Hgb urine dipstick SMALL (*)    All other components within normal limits  CBC WITH DIFFERENTIAL/PLATELET  COMPREHENSIVE METABOLIC PANEL  PREGNANCY, URINE  TSH  URINE MICROSCOPIC-ADD ON    I, Julianne Rice, MD, personally reviewed and evaluated these  lab results as part of my medical decision-making.  Imaging Review No results found.  EKG Interpretation None     MDM   Final diagnoses:  Nonintractable headache, unspecified chronicity pattern, unspecified headache type  Elevated blood pressure     I personally performed the services described in this documentation, which was scribed in my presence. The recorded information has been reviewed and is accurate.  Patient is very well-appearing. Presents with headache for greater than 12 hours. No neurologic signs on exam. Patient is tachycardic in hypertensive on presentation. States she has a history of  hypertension but is not taking medications. Unsure whether headache is caused by the elevated blood pressure or is elevating her pressure. Low suspicion for intracranial bleed including subarachnoid hemorrhage given the gradual nature of symptoms. States she's had similar headache in the past that resolved spontaneously. We'll treat symptomatically and check TSH given exophthalmosis and prominent thyroid.   Labs are within normal limits including TSH. Blood pressure and pulse have improved with treatment of pain. Patient states her headache is nearly resolved. She continues to have normal neurologic exam. The patient can be safely be discharged at this time. She's been given return precautions and voiced understanding.  Julianne Rice, MD 01/23/15 [phone]

## 2015-03-04 ENCOUNTER — Other Ambulatory Visit: Payer: Self-pay | Admitting: Gastroenterology

## 2015-03-04 DIAGNOSIS — R1013 Epigastric pain: Secondary | ICD-10-CM

## 2015-03-04 DIAGNOSIS — R110 Nausea: Secondary | ICD-10-CM

## 2015-03-15 ENCOUNTER — Encounter (HOSPITAL_COMMUNITY)
Admission: RE | Admit: 2015-03-15 | Discharge: 2015-03-15 | Disposition: A | Discharge status: Home / Self Care | Payer: Federal, State, Local not specified - PPO | Source: Ambulatory Visit | Attending: Gastroenterology | Admitting: Gastroenterology

## 2015-03-15 ENCOUNTER — Other Ambulatory Visit: Payer: Self-pay | Admitting: Gastroenterology

## 2015-03-15 ENCOUNTER — Ambulatory Visit (HOSPITAL_COMMUNITY)
Admission: RE | Admit: 2015-03-15 | Discharge: 2015-03-15 | Disposition: A | Discharge status: Home / Self Care | Payer: Federal, State, Local not specified - PPO | Source: Ambulatory Visit | Attending: Gastroenterology | Admitting: Gastroenterology

## 2015-03-15 DIAGNOSIS — R110 Nausea: Secondary | ICD-10-CM | POA: Insufficient documentation

## 2015-03-15 DIAGNOSIS — R938 Abnormal findings on diagnostic imaging of other specified body structures: Secondary | ICD-10-CM | POA: Insufficient documentation

## 2015-03-15 DIAGNOSIS — R1013 Epigastric pain: Secondary | ICD-10-CM

## 2015-03-15 DIAGNOSIS — R935 Abnormal findings on diagnostic imaging of other abdominal regions, including retroperitoneum: Secondary | ICD-10-CM

## 2015-03-15 MED ORDER — TECHNETIUM TC 99M MEBROFENIN IV KIT: 5.1000 | PACK | Freq: Once | INTRAVENOUS | Status: DC | PRN

## 2015-03-15 MED ORDER — SINCALIDE 5 MCG IJ SOLR
INTRAMUSCULAR | Status: AC
  Administered 2015-03-15: 1.81 ug via INTRAVENOUS
  Filled 2015-03-15: qty 5

## 2015-03-15 MED ORDER — STERILE WATER FOR INJECTION IJ SOLN
INTRAMUSCULAR | Status: AC
  Filled 2015-03-15: qty 10

## 2015-03-15 MED ORDER — SINCALIDE 5 MCG IJ SOLR
0.0200 ug/kg | Freq: Once | INTRAMUSCULAR | Status: AC
  Administered 2015-03-15: 1.81 ug via INTRAVENOUS

## 2015-03-21 ENCOUNTER — Ambulatory Visit
Admission: RE | Admit: 2015-03-21 | Discharge: 2015-03-21 | Disposition: A | Discharge status: Home / Self Care | Payer: Federal, State, Local not specified - PPO | Source: Ambulatory Visit | Attending: Gastroenterology | Admitting: Gastroenterology

## 2015-03-21 DIAGNOSIS — R935 Abnormal findings on diagnostic imaging of other abdominal regions, including retroperitoneum: Secondary | ICD-10-CM

## 2015-03-21 MED ORDER — GADOBENATE DIMEGLUMINE 529 MG/ML IV SOLN
19.0000 mL | Freq: Once | INTRAVENOUS | Status: AC | PRN
  Administered 2015-03-21: 19 mL via INTRAVENOUS

## 2015-03-28 ENCOUNTER — Ambulatory Visit: Payer: Federal, State, Local not specified - PPO | Admitting: Family Medicine

## 2015-04-05 ENCOUNTER — Ambulatory Visit (INDEPENDENT_AMBULATORY_CARE_PROVIDER_SITE_OTHER): Payer: Federal, State, Local not specified - PPO | Admitting: Family Medicine

## 2015-04-05 ENCOUNTER — Encounter (INDEPENDENT_AMBULATORY_CARE_PROVIDER_SITE_OTHER): Payer: Self-pay

## 2015-04-05 ENCOUNTER — Encounter: Payer: Self-pay | Admitting: Family Medicine

## 2015-04-05 VITALS — BP 116/88 | HR 69 | Temp 98.9°F | Resp 18 | Ht 67.0 in | Wt 204.0 lb

## 2015-04-05 DIAGNOSIS — F329 Major depressive disorder, single episode, unspecified: Secondary | ICD-10-CM

## 2015-04-05 DIAGNOSIS — N809 Endometriosis, unspecified: Secondary | ICD-10-CM | POA: Diagnosis not present

## 2015-04-05 DIAGNOSIS — K219 Gastro-esophageal reflux disease without esophagitis: Secondary | ICD-10-CM

## 2015-04-05 DIAGNOSIS — G4700 Insomnia, unspecified: Secondary | ICD-10-CM

## 2015-04-05 DIAGNOSIS — E8881 Metabolic syndrome: Secondary | ICD-10-CM

## 2015-04-05 DIAGNOSIS — Z23 Encounter for immunization: Secondary | ICD-10-CM

## 2015-04-05 DIAGNOSIS — N949 Unspecified condition associated with female genital organs and menstrual cycle: Secondary | ICD-10-CM | POA: Diagnosis not present

## 2015-04-05 DIAGNOSIS — G8929 Other chronic pain: Secondary | ICD-10-CM

## 2015-04-05 DIAGNOSIS — E049 Nontoxic goiter, unspecified: Secondary | ICD-10-CM | POA: Diagnosis not present

## 2015-04-05 DIAGNOSIS — R102 Pelvic and perineal pain unspecified side: Secondary | ICD-10-CM

## 2015-04-05 DIAGNOSIS — Z13220 Encounter for screening for lipoid disorders: Secondary | ICD-10-CM

## 2015-04-05 DIAGNOSIS — Z0000 Encounter for general adult medical examination without abnormal findings: Secondary | ICD-10-CM

## 2015-04-05 LAB — POCT URINALYSIS DIPSTICK
BILIRUBIN UA: NEGATIVE
GLUCOSE UA: NEGATIVE
KETONES UA: NEGATIVE
Leukocytes, UA: NEGATIVE
NITRITE UA: NEGATIVE
PROTEIN UA: NEGATIVE
RBC UA: NEGATIVE
Urobilinogen, UA: NEGATIVE
pH, UA: 6

## 2015-04-05 MED ORDER — BUPROPION HCL ER (XL) 150 MG PO TB24: 150.0000 mg | ORAL_TABLET | Freq: Every day | ORAL | Status: DC

## 2015-04-05 MED ORDER — DULOXETINE HCL 30 MG PO CPEP: 30.0000 mg | ORAL_CAPSULE | Freq: Every day | ORAL | Status: DC

## 2015-04-05 MED ORDER — TEMAZEPAM 30 MG PO CAPS: 30.0000 mg | ORAL_CAPSULE | Freq: Every evening | ORAL | Status: DC | PRN

## 2015-04-05 NOTE — Progress Notes (Signed)
Name: Stacey Stanley   MRN: [id number]    DOB: 1975/08/19   Date:04/05/2015       Progress Note  Subjective  Chief Complaint  Chief Complaint  Patient presents with  . Annual Exam  . Endometriosis    pt states has chronic pelvic pain with and without menses    HPI  Well Woman: she has chronic pelvic pain, dyspareunia, heavy cycles, endometriosis, spotting. She used to be on Depo but stopped this past month.  She sees Dr. Garwin Brothers and pap smear is up to date. She has tried pain medication for endometriosis without help in the past. Pain is daily, constant, when not on her cycle able to function , but misses work during her cycles.   Metabolic Syndrome: taking Metformin daily , no side effects. No polyphagia, polydipsia or polyuria  GERD: seen GI a few weeks ago, Dr. Collene Mares in [place] for acute epigastric pain, she has a , [place] and HIDA scan and MRI - there was an abnormality on [place] but normal MRI of pancreas ( per patient report ), she states pain is not as intense now, just having burning substernal area, and regurgitation intermittently, she took Nexium that was prescribed by her but did not help and she stopped, advised her to call GI again  Depression/Anxiety: feeling more tired than usual, occasional crying, also has anhedonia, gaining weight. Taking only Wellbutrin and is not responding. She tried Cymbalta but states she had withdrawals. Advised to wean self slowly, and we will go down on Wellbutrin dose. 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 Chronic pelvic pain in female 04/05/2015  . History of hypertension 04/05/2015  . GERD (gastroesophageal reflux disease) 04/05/2015  . Anxiety and depression 11/25/2014   . Insomnia, persistent 11/25/2014  . Endometriosis 11/25/2014  . Dysmetabolic syndrome 94/76/5465  . Obesity (BMI 30-39.9) 11/25/2014    Past Surgical History  Procedure Laterality Date  . Metatarsil       left foot surgery   . Dilation and curettage of uterus    . Induced abortion    . Laparoscopy  1992    endometriosis  . Eye surgery  06/28/13    right laser eye surgery - repair retina  . Laparoscopy N/A 07/19/2013    Procedure: LAPAROSCOPY DIAGNOSTIC  with resection of endometriosis;  Surgeon: Marvene Staff, MD;  Location: [facility];  Service: Gynecology;  Laterality: N/A;  . Hysteroscopy w/d&c N/A 07/19/2013    Procedure: DILATATION AND CURETTAGE /HYSTEROSCOPY;  Surgeon: Marvene Staff, MD;  Location: [facility];  Service: Gynecology;  Laterality: N/A;    Family History  Problem Relation Age of Onset  . Diabetes Mother   . Hypertension Mother   . Hypertension Father   . Kidney disease Father   . Anesthesia problems Neg Hx   . Hypotension Neg Hx   . Malignant hyperthermia Neg Hx   . Pseudochol deficiency Neg Hx   . Heart disease Brother     Social History   Social History  . Marital Status: Married    Spouse Name: N/A  . Number of Children: N/A  . Years of Education: N/A   Occupational  History  . Not on file.   Social History Main Topics  . Smoking status: Current Every Day Smoker -- 0.50 packs/day for 21 years    Types: Cigarettes    Start date: 11/25/1993  . Smokeless tobacco: Never Used  . Alcohol Use: No  . Drug Use: No  . Sexual Activity:    Partners: Male    Birth Control/ Protection: Injection   Other Topics Concern  . Not on file   Social History Narrative     Current outpatient prescriptions:  .  buPROPion (WELLBUTRIN XL) 300 MG 24 hr tablet, Take 1 tablet (300 mg total) by mouth daily., Disp: 30 tablet, Rfl: 2 .  metFORMIN (GLUCOPHAGE) 500 MG tablet, Take 1 tablet (500 mg total) by mouth 2 (two) times daily., Disp: 60 tablet, Rfl:  5 .  temazepam (RESTORIL) 15 MG capsule, Take 1 capsule (15 mg total) by mouth at bedtime as needed for sleep., Disp: 30 capsule, Rfl: 2  No Known Allergies   ROS  Constitutional: Negative for fever . Positive weight change.  Respiratory: Negative for cough and shortness of breath.   Cardiovascular: Negative for chest pain or palpitations.  Gastrointestinal: Positive for abdominal pain, no bowel changes.  Musculoskeletal: Negative for gait problem or joint swelling.  Skin: Negative for rash.  Neurological: Negative for dizziness or headache.  No other specific complaints in a complete review of systems (except as listed in HPI above).  Objective  Filed Vitals:   04/05/15 1129  BP: 116/88  Pulse: 69  Temp: 98.9 F (37.2 C)  TempSrc: Oral  Resp: 18  Height: 5' 7" (1.702 m)  Weight: 204 lb (92.534 kg)  SpO2: 98%    Body mass index is 31.94 kg/(m^2).  Physical Exam  Constitutional: Patient appears well-developed and well-nourished. Obese No distress HENT: Head: Normocephalic and atraumatic. Ears: B TMs ok, no erythema or effusion; Nose: Nose normal. Mouth/Throat: Oropharynx is clear and moist. No oropharyngeal exudate.  Eyes: Conjunctivae and EOM are normal. Pupils are equal, round, and reactive to light. No scleral icterus.  Neck: Normal range of motion. Neck supple. No JVD present. thyromegaly present, lump on right side.  Cardiovascular: Normal rate, regular rhythm and normal heart sounds.  No murmur heard. No BLE edema. Pulmonary/Chest: Effort normal and breath sounds normal. No respiratory distress. Abdominal: Soft. Bowel sounds are normal, no distension. There is no tenderness. no masses Breast: multiple breast  lumps - chronic - sees gyn -  no nipple discharge or rashes FEMALE GENITALIA:  Not done RECTAL: not done Musculoskeletal: Normal range of motion, no joint effusions. No gross deformities Neurological: he is alert and oriented to person, place, and time. No  cranial nerve deficit. Coordination, balance, strength, speech and gait are normal.  Skin: Skin is warm and dry. No rash noted. No erythema.  Psychiatric: Patient has a normal mood and affect. behavior is normal. Judgment and thought content normal.  Recent Results (from the past 2160 hour(s))  Urinalysis, Routine w reflex microscopic (not at [facility])     Status: Abnormal   Collection Time: 01/23/15  2:37 AM  Result Value Ref Range   Color, Urine YELLOW YELLOW   APPearance CLEAR CLEAR   Specific Gravity, Urine 1.012 1.005 - 1.030   pH 6.5 5.0 - 8.0   Glucose, UA NEGATIVE NEGATIVE mg/dL   Hgb urine dipstick SMALL (A) NEGATIVE   Bilirubin Urine NEGATIVE NEGATIVE   Ketones, ur NEGATIVE NEGATIVE mg/dL   Protein, ur NEGATIVE NEGATIVE mg/dL  Urobilinogen, UA 0.2 0.0 - 1.0 mg/dL   Nitrite NEGATIVE NEGATIVE   Leukocytes, UA NEGATIVE NEGATIVE  Pregnancy, urine     Status: None   Collection Time: 01/23/15  2:37 AM  Result Value Ref Range   Preg Test, Ur NEGATIVE NEGATIVE    Comment:        THE SENSITIVITY OF THIS METHODOLOGY IS >20 mIU/mL.   Urine microscopic-add on     Status: None   Collection Time: 01/23/15  2:37 AM  Result Value Ref Range   Squamous Epithelial / LPF RARE RARE   WBC, UA 0-2 <3 WBC/hpf   RBC / HPF 0-2 <3 RBC/hpf   Bacteria, UA RARE RARE  CBC with Differential/Platelet     Status: None   Collection Time: 01/23/15  3:03 AM  Result Value Ref Range   WBC 7.2 4.0 - 10.5 K/uL   RBC 5.11 3.87 - 5.11 MIL/uL   Hemoglobin 14.9 12.0 - 15.0 g/dL   HCT 44.3 36.0 - 46.0 %   MCV 86.7 78.0 - 100.0 fL   MCH 29.2 26.0 - 34.0 pg   MCHC 33.6 30.0 - 36.0 g/dL   RDW 13.0 11.5 - 15.5 %   Platelets 281 150 - 400 K/uL   Neutrophils Relative % 54 43 - 77 %   Neutro Abs 3.8 1.7 - 7.7 K/uL   Lymphocytes Relative 40 12 - 46 %   Lymphs Abs 2.9 0.7 - 4.0 K/uL   Monocytes Relative 4 3 - 12 %   Monocytes Absolute 0.3 0.1 - 1.0 K/uL   Eosinophils Relative 2 0 - 5 %   Eosinophils Absolute  0.2 0.0 - 0.7 K/uL   Basophils Relative 0 0 - 1 %   Basophils Absolute 0.0 0.0 - 0.1 K/uL  Comprehensive metabolic panel     Status: None   Collection Time: 01/23/15  3:03 AM  Result Value Ref Range   Sodium 138 135 - 145 mmol/L   Potassium 3.8 3.5 - 5.1 mmol/L   Chloride 110 101 - 111 mmol/L   CO2 23 22 - 32 mmol/L   Glucose, Bld 98 65 - 99 mg/dL   BUN 8 6 - 20 mg/dL   Creatinine, Ser 0.88 0.44 - 1.00 mg/dL   Calcium 9.8 8.9 - 10.3 mg/dL   Total Protein 7.6 6.5 - 8.1 g/dL   Albumin 4.4 3.5 - 5.0 g/dL   AST 18 15 - 41 U/L   ALT 17 14 - 54 U/L   Alkaline Phosphatase 52 38 - 126 U/L   Total Bilirubin 0.8 0.3 - 1.2 mg/dL   GFR calc non Af Amer >60 >60 mL/min   GFR calc Af Amer >60 >60 mL/min    Comment: (NOTE) The eGFR has been calculated using the CKD EPI equation. This calculation has not been validated in all clinical situations. eGFR's persistently <60 mL/min signify possible Chronic Kidney Disease.    Anion gap 5 5 - 15  TSH     Status: None   Collection Time: 01/23/15  3:03 AM  Result Value Ref Range   TSH 1.117 0.350 - 4.500 uIU/mL  POCT Urinalysis Dipstick     Status: Normal   Collection Time: 04/05/15 11:54 AM  Result Value Ref Range   Color, UA yellow    Clarity, UA clear    Glucose, UA neg    Bilirubin, UA neg    Ketones, UA neg    Spec Grav, UA <=1.005    Blood, UA  neg    pH, UA 6.0    Protein, UA neg    Urobilinogen, UA negative    Nitrite, UA neg    Leukocytes, UA Negative Negative      PHQ2/9: Depression screen [facility] 2/9 04/05/2015 11/26/2014  Decreased Interest 0 0  Down, Depressed, Hopeless 0 1  PHQ - 2 Score 0 1     Fall Risk: Fall Risk  04/05/2015 11/26/2014  Falls in the past year? No No     Functional Status Survey: Is the patient deaf or have difficulty hearing?: No Does the patient have difficulty seeing, even when wearing glasses/contacts?: Yes (contacts /glasses) Does the patient have difficulty concentrating, remembering, or making  decisions?: No Does the patient have difficulty walking or climbing stairs?: No Does the patient have difficulty dressing or bathing?: No   Assessment & Plan   1. Annual physical exam  - Visual acuity screening - POCT Urinalysis Dipstick - Hearing screening; Future  2. Needs flu shot  - Flu Vaccine QUAD 36+ mos PF IM (Fluarix & Fluzone Quad PF)  3. Chronic pelvic pain in female  - Ambulatory referral to Pain Clinic  4. Insomnia, persistent  - temazepam (RESTORIL) 30 MG capsule; Take 1 capsule (30 mg total) by mouth at bedtime as needed for sleep.  Dispense: 30 capsule; Refill: 0  5. Endometriosis  - Ambulatory referral to Pain Clinic  6. Gastroesophageal reflux disease without esophagitis  Call Dr. Collene Mares as instructed, she states responded to [facility] in the past  7. Major depression, chronic ([place])  Getting worse, we will try Cymbalta again, advised to wean off when deciding to stop , but with her history of recurrent depression, should stay on medication for a life time - DULoxetine (CYMBALTA) 30 MG capsule; Take 1-2 capsules (30-60 mg total) by mouth daily.  Dispense: 60 capsule; Refill: 0 - buPROPion (WELLBUTRIN XL) 150 MG 24 hr tablet; Take 1 tablet (150 mg total) by mouth daily.  Dispense: 30 tablet; Refill: 0  8. Dysmetabolic syndrome  - Hemoglobin A1c  9 Lipid screening  - Lipid panel  10 Goiter  - US Soft Tissue Head/Neck; Future

## 2015-04-06 LAB — HEMOGLOBIN A1C
ESTIMATED AVERAGE GLUCOSE: 117 mg/dL
Hgb A1c MFr Bld: 5.7 % — ABNORMAL HIGH (ref 4.8–5.6)

## 2015-04-06 LAB — LIPID PANEL
CHOL/HDL RATIO: 2.6 ratio (ref 0.0–4.4)
Cholesterol, Total: 166 mg/dL (ref 100–199)
HDL: 65 mg/dL (ref >39)
LDL CALC: 91 mg/dL (ref 0–99)
Triglycerides: 49 mg/dL (ref 0–149)

## 2015-04-09 ENCOUNTER — Telehealth: Payer: Self-pay | Admitting: Family Medicine

## 2015-04-09 NOTE — Telephone Encounter (Signed)
Patient is needing a prior authorization on Temazepam 30mg  per [facility] on [address]. Please contact patient once approved.

## 2015-04-10 ENCOUNTER — Telehealth: Payer: Self-pay | Admitting: Family Medicine

## 2015-04-10 NOTE — Telephone Encounter (Signed)
Spoke with [employer], patient has picked up Rx, no PA is needed

## 2015-04-10 NOTE — Telephone Encounter (Signed)
Checking status on authorization on tamazapam. Please return call [phone]

## 2015-05-08 ENCOUNTER — Ambulatory Visit: Payer: Federal, State, Local not specified - PPO | Admitting: Family Medicine

## 2015-05-21 ENCOUNTER — Encounter: Payer: Self-pay | Admitting: Family Medicine

## 2015-05-21 ENCOUNTER — Ambulatory Visit (INDEPENDENT_AMBULATORY_CARE_PROVIDER_SITE_OTHER): Payer: Federal, State, Local not specified - PPO | Admitting: Family Medicine

## 2015-05-21 VITALS — BP 112/90 | HR 93 | Temp 98.1°F | Resp 18 | Ht 67.0 in | Wt 205.2 lb

## 2015-05-21 DIAGNOSIS — E538 Deficiency of other specified B group vitamins: Secondary | ICD-10-CM | POA: Diagnosis not present

## 2015-05-21 DIAGNOSIS — G629 Polyneuropathy, unspecified: Secondary | ICD-10-CM | POA: Diagnosis not present

## 2015-05-21 MED ORDER — CYANOCOBALAMIN 1000 MCG/ML IJ SOLN
1000.0000 ug | Freq: Once | INTRAMUSCULAR | Status: AC
  Administered 2015-05-21: 1000 ug via INTRAMUSCULAR

## 2015-05-21 MED ORDER — GABAPENTIN 100 MG PO CAPS: 100.0000 mg | ORAL_CAPSULE | Freq: Every day | ORAL | Status: DC

## 2015-05-21 NOTE — Progress Notes (Signed)
Name: Stacey Stanley   MRN: [id number]    DOB: [birth year]   Date:05/21/2015       Progress Note  Subjective  Chief Complaint  Chief Complaint  Patient presents with  . Foot Pain    biltareal worse in last 2 weeks, having some numbness,tingling and burning sensation.  having trouble walking and standing    HPI  Neuropathy B12 Deficiency: she was diagnosed with B12 deficiency in June 2016, she took supplements for a couple of months but does not like taking pills and stop it. She states her pain/burning and tingling on both feet is getting progressively worse and now also on both hands. No weakness. She also feels very tired. She has not been able to sleep because her feet feels like they are on fire at night  Patient Active Problem List   Diagnosis Date Noted  . Chronic pelvic pain in female 04/05/2015  . History of hypertension 04/05/2015  . GERD (gastroesophageal reflux disease) 04/05/2015  . Chronic major depressive disorder ([place]) 11/25/2014  . Insomnia, persistent 11/25/2014  . Endometriosis 11/25/2014  . Dysmetabolic syndrome [id number]  . Obesity (BMI 30-39.9) 11/25/2014    Past Surgical History  Procedure Laterality Date  . Metatarsil       left foot surgery   . Dilation and curettage of uterus    . Induced abortion    . Laparoscopy  1992    endometriosis  . Eye surgery  06/28/13    right laser eye surgery - repair retina  . Laparoscopy N/A 07/19/2013    Procedure: LAPAROSCOPY DIAGNOSTIC  with resection of endometriosis;  Surgeon: Marvene Staff, MD;  Location: [facility];  Service: Gynecology;  Laterality: N/A;  . Hysteroscopy w/d&c N/A 07/19/2013    Procedure: DILATATION AND CURETTAGE /HYSTEROSCOPY;  Surgeon: Marvene Staff, MD;  Location: [facility];  Service: Gynecology;  Laterality: N/A;    Family History  Problem Relation Age of Onset  . Diabetes Mother   . Hypertension Mother   . Hypertension Father   . Kidney disease Father   . Anesthesia problems  Neg Hx   . Hypotension Neg Hx   . Malignant hyperthermia Neg Hx   . Pseudochol deficiency Neg Hx   . Heart disease Brother     Social History   Social History  . Marital Status: Married    Spouse Name: N/A  . Number of Children: N/A  . Years of Education: N/A   Occupational History  . Not on file.   Social History Main Topics  . Smoking status: Current Every Day Smoker -- 0.50 packs/day for 21 years    Types: Cigarettes    Start date: 11/25/1993  . Smokeless tobacco: Never Used  . Alcohol Use: No  . Drug Use: No  . Sexual Activity:    Partners: Male    Birth Control/ Protection: Injection   Other Topics Concern  . Not on file   Social History Narrative     Current outpatient prescriptions:  .  buPROPion (WELLBUTRIN XL) 150 MG 24 hr tablet, Take 1 tablet (150 mg total) by mouth daily., Disp: 30 tablet, Rfl: 0 .  DULoxetine (CYMBALTA) 30 MG capsule, Take 1-2 capsules (30-60 mg total) by mouth daily., Disp: 60 capsule, Rfl: 0 .  gabapentin (NEURONTIN) 100 MG capsule, Take 1-3 capsules (100-300 mg total) by mouth at bedtime., Disp: 90 capsule, Rfl: 0 .  metFORMIN (GLUCOPHAGE) 500 MG tablet, Take 1 tablet (500 mg total) by mouth 2 (two)  times daily., Disp: 60 tablet, Rfl: 5 .  temazepam (RESTORIL) 30 MG capsule, Take 1 capsule (30 mg total) by mouth at bedtime as needed for sleep., Disp: 30 capsule, Rfl: 0  No Known Allergies   ROS  Ten systems reviewed and is negative except as mentioned in HPI   Objective  Filed Vitals:   05/21/15 1634  BP: 112/90  Pulse: 93  Temp: 98.1 F (36.7 C)  TempSrc: Oral  Resp: 18  Height: 5\' 7"  (1.702 m)  Weight: 205 lb 3.2 oz (93.078 kg)  SpO2: 97%    Body mass index is 32.13 kg/(m^2).  Physical Exam  Constitutional: Patient appears well-developed and well-nourished. Obese  No distress.  HEENT: head atraumatic, normocephalic, pupils equal and reactive to light,neck supple, throat within normal limits Cardiovascular:  Normal rate, regular rhythm and normal heart sounds.  No murmur heard. No BLE edema. Normal PD and TD Pulmonary/Chest: Effort normal and breath sounds normal. No respiratory distress. Abdominal: Soft.  There is no tenderness. Psychiatric: Patient has a normal mood and affect. behavior is normal. Judgment and thought content normal. Neuro: normal monofilament test, normal grip, no neuro deficit  Recent Results (from the past 2160 hour(s))  POCT Urinalysis Dipstick     Status: Normal   Collection Time: 04/05/15 11:54 AM  Result Value Ref Range   Color, UA yellow    Clarity, UA clear    Glucose, UA neg    Bilirubin, UA neg    Ketones, UA neg    Spec Grav, UA <=1.005    Blood, UA neg    pH, UA 6.0    Protein, UA neg    Urobilinogen, UA negative    Nitrite, UA neg    Leukocytes, UA Negative Negative  Hemoglobin A1c     Status: Abnormal   Collection Time: 04/05/15 12:54 PM  Result Value Ref Range   Hgb A1c MFr Bld 5.7 (H) 4.8 - 5.6 %    Comment:          Pre-diabetes: 5.7 - 6.4          Diabetes: >6.4          Glycemic control for adults with diabetes: <7.0    Est. average glucose Bld gHb Est-mCnc 117 mg/dL  Lipid panel     Status: None   Collection Time: 04/05/15 12:54 PM  Result Value Ref Range   Cholesterol, Total 166 100 - 199 mg/dL   Triglycerides 49 0 - 149 mg/dL   HDL 65 >39 mg/dL   LDL Calculated 91 0 - 99 mg/dL   Chol/HDL Ratio 2.6 0.0 - 4.4 ratio units    PHQ2/9: Depression screen [facility] 2/9 04/05/2015 11/26/2014  Decreased Interest 0 0  Down, Depressed, Hopeless 0 1  PHQ - 2 Score 0 1     Fall Risk: Fall Risk  04/05/2015 11/26/2014  Falls in the past year? No No     Assessment & Plan  1. Vitamin B12 deficiency  - Vitamin B12 - CBC with Differential/Platelet - cyanocobalamin ((VITAMIN B-12)) injection 1,000 mcg; Inject 1 mL (1,000 mcg total) into the muscle once.  2. Neuropathy (HCC)  Symptoms are severe and worse at night, we will give her B12 injection  today and start gabapentin for symptoms control - Vitamin B12 - gabapentin (NEURONTIN) 100 MG capsule; Take 1-3 capsules (100-300 mg total) by mouth at bedtime.  Dispense: 90 capsule; Refill: 0

## 2015-05-28 ENCOUNTER — Telehealth: Payer: Self-pay | Admitting: Family Medicine

## 2015-05-28 NOTE — Telephone Encounter (Signed)
I can refer her to neurologist. Likely from B12 deficiency and it should improve once level improves.

## 2015-05-28 NOTE — Telephone Encounter (Signed)
PT SAID THAT HER FEET ARE NO BETTER AND HAS NOT BEEN ABLE TO EVEN WORK. WANTS TO KNOW IF YOU COULD GO AHEAD AND GET HER A REFERRAL. SAID YOU TALKED ABOUT A NERVE CONDUCTOR TEST.

## 2015-05-29 NOTE — Telephone Encounter (Signed)
Left voicemail to call back if would like referral placed

## 2015-06-02 DIAGNOSIS — E041 Nontoxic single thyroid nodule: Secondary | ICD-10-CM

## 2015-06-02 HISTORY — DX: Nontoxic single thyroid nodule: E04.1

## 2015-06-12 ENCOUNTER — Ambulatory Visit: Payer: Federal, State, Local not specified - PPO | Admitting: Podiatry

## 2015-06-19 ENCOUNTER — Ambulatory Visit: Payer: Federal, State, Local not specified - PPO | Admitting: Podiatry

## 2015-06-21 ENCOUNTER — Ambulatory Visit: Payer: Federal, State, Local not specified - PPO | Admitting: Family Medicine

## 2015-06-25 ENCOUNTER — Other Ambulatory Visit: Payer: Self-pay | Admitting: Family Medicine

## 2015-06-25 NOTE — Telephone Encounter (Signed)
Patient requesting refill. 

## 2015-07-09 ENCOUNTER — Ambulatory Visit: Payer: Federal, State, Local not specified - PPO | Admitting: Anesthesiology

## 2015-07-12 ENCOUNTER — Ambulatory Visit: Payer: Federal, State, Local not specified - PPO | Admitting: Family Medicine

## 2015-07-31 ENCOUNTER — Encounter: Payer: Self-pay | Admitting: Family Medicine

## 2015-07-31 ENCOUNTER — Ambulatory Visit (INDEPENDENT_AMBULATORY_CARE_PROVIDER_SITE_OTHER): Payer: Federal, State, Local not specified - PPO | Admitting: Family Medicine

## 2015-07-31 VITALS — BP 118/84 | HR 76 | Temp 98.1°F | Resp 14 | Wt 202.4 lb

## 2015-07-31 DIAGNOSIS — G629 Polyneuropathy, unspecified: Secondary | ICD-10-CM

## 2015-07-31 DIAGNOSIS — F329 Major depressive disorder, single episode, unspecified: Secondary | ICD-10-CM

## 2015-07-31 DIAGNOSIS — E538 Deficiency of other specified B group vitamins: Secondary | ICD-10-CM

## 2015-07-31 DIAGNOSIS — E049 Nontoxic goiter, unspecified: Secondary | ICD-10-CM | POA: Diagnosis not present

## 2015-07-31 DIAGNOSIS — G4700 Insomnia, unspecified: Secondary | ICD-10-CM

## 2015-07-31 DIAGNOSIS — E8881 Metabolic syndrome: Secondary | ICD-10-CM | POA: Diagnosis not present

## 2015-07-31 DIAGNOSIS — N809 Endometriosis, unspecified: Secondary | ICD-10-CM

## 2015-07-31 DIAGNOSIS — E669 Obesity, unspecified: Secondary | ICD-10-CM | POA: Diagnosis not present

## 2015-07-31 DIAGNOSIS — E041 Nontoxic single thyroid nodule: Secondary | ICD-10-CM | POA: Insufficient documentation

## 2015-07-31 MED ORDER — METFORMIN HCL 500 MG PO TABS: 500.0000 mg | ORAL_TABLET | Freq: Two times a day (BID) | ORAL | Status: DC

## 2015-07-31 MED ORDER — LORCASERIN HCL ER 20 MG PO TB24: 1.0000 | ORAL_TABLET | Freq: Every day | ORAL | Status: DC

## 2015-07-31 MED ORDER — BUPROPION HCL ER (XL) 300 MG PO TB24: 300.0000 mg | ORAL_TABLET | Freq: Every day | ORAL | Status: DC

## 2015-07-31 MED ORDER — CYANOCOBALAMIN 1000 MCG/ML IJ SOLN
1000.0000 ug | Freq: Once | INTRAMUSCULAR | Status: AC
  Administered 2015-07-31: 1000 ug via INTRAMUSCULAR

## 2015-07-31 MED ORDER — TEMAZEPAM 30 MG PO CAPS: 30.0000 mg | ORAL_CAPSULE | Freq: Every evening | ORAL | Status: DC | PRN
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
## 2015-08-06 ENCOUNTER — Ambulatory Visit: Payer: Federal, State, Local not specified - PPO

## 2015-08-07 ENCOUNTER — Inpatient Hospital Stay (HOSPITAL_COMMUNITY)
Admission: AD | Admit: 2015-08-07 | Discharge: 2015-08-07 | Discharge status: Against Medical Advice | Payer: Federal, State, Local not specified - PPO | Source: Ambulatory Visit | Attending: Obstetrics and Gynecology | Admitting: Obstetrics and Gynecology

## 2015-08-07 DIAGNOSIS — Z5320 Procedure and treatment not carried out because of patient's decision for unspecified reasons: Secondary | ICD-10-CM | POA: Diagnosis not present

## 2015-08-07 DIAGNOSIS — R102 Pelvic and perineal pain: Secondary | ICD-10-CM | POA: Diagnosis not present

## 2015-08-07 NOTE — MAU Note (Signed)
Pt not in lobby.  

## 2015-08-07 NOTE — MAU Note (Signed)
Pt c/o pelvic pain-states that it is chronic and has endometriosis. Has rx of 4mg  dilaudid. Was prescribed in January. Tried to get an appt in the office and is scheduled for 3/20 but cannot wait until then. Is not having vaginal bleeding but normally will bleed 20-24 days/month.

## 2015-11-04 ENCOUNTER — Ambulatory Visit: Payer: Federal, State, Local not specified - PPO | Admitting: Family Medicine

## 2016-01-05 ENCOUNTER — Encounter (HOSPITAL_COMMUNITY): Payer: Self-pay | Admitting: Emergency Medicine

## 2016-01-05 ENCOUNTER — Ambulatory Visit (HOSPITAL_COMMUNITY)
Admission: EM | Admit: 2016-01-05 | Discharge: 2016-01-05 | Disposition: A | Discharge status: Home / Self Care | Payer: Federal, State, Local not specified - PPO | Attending: Physician Assistant | Admitting: Physician Assistant

## 2016-01-05 DIAGNOSIS — J441 Chronic obstructive pulmonary disease with (acute) exacerbation: Secondary | ICD-10-CM | POA: Diagnosis not present

## 2016-01-05 MED ORDER — ALBUTEROL SULFATE HFA 108 (90 BASE) MCG/ACT IN AERS: 1.0000 | INHALATION_SPRAY | Freq: Four times a day (QID) | RESPIRATORY_TRACT | 0 refills | Status: DC | PRN

## 2016-01-05 NOTE — ED Triage Notes (Signed)
The patient presented to the [facility] with a complaint of shortness of breath with associated chest and back tightness x 3 days.

## 2016-01-06 DIAGNOSIS — R102 Pelvic and perineal pain: Secondary | ICD-10-CM | POA: Diagnosis not present

## 2016-01-06 DIAGNOSIS — M62838 Other muscle spasm: Secondary | ICD-10-CM | POA: Diagnosis not present

## 2016-01-06 DIAGNOSIS — N809 Endometriosis, unspecified: Secondary | ICD-10-CM | POA: Diagnosis not present

## 2016-01-06 DIAGNOSIS — N83202 Unspecified ovarian cyst, left side: Secondary | ICD-10-CM | POA: Diagnosis not present

## 2016-01-10 ENCOUNTER — Encounter: Payer: Self-pay | Admitting: Family Medicine

## 2016-01-10 ENCOUNTER — Ambulatory Visit (INDEPENDENT_AMBULATORY_CARE_PROVIDER_SITE_OTHER): Payer: Federal, State, Local not specified - PPO | Admitting: Family Medicine

## 2016-01-10 VITALS — BP 130/90 | HR 90 | Temp 98.3°F | Resp 18 | Ht 67.0 in | Wt 200.4 lb

## 2016-01-10 DIAGNOSIS — I10 Essential (primary) hypertension: Secondary | ICD-10-CM

## 2016-01-10 DIAGNOSIS — G4700 Insomnia, unspecified: Secondary | ICD-10-CM

## 2016-01-10 DIAGNOSIS — F329 Major depressive disorder, single episode, unspecified: Secondary | ICD-10-CM

## 2016-01-10 DIAGNOSIS — M79672 Pain in left foot: Secondary | ICD-10-CM

## 2016-01-10 DIAGNOSIS — M79671 Pain in right foot: Secondary | ICD-10-CM | POA: Diagnosis not present

## 2016-01-10 DIAGNOSIS — E669 Obesity, unspecified: Secondary | ICD-10-CM | POA: Diagnosis not present

## 2016-01-10 DIAGNOSIS — Z13220 Encounter for screening for lipoid disorders: Secondary | ICD-10-CM | POA: Diagnosis not present

## 2016-01-10 DIAGNOSIS — N949 Unspecified condition associated with female genital organs and menstrual cycle: Secondary | ICD-10-CM | POA: Diagnosis not present

## 2016-01-10 DIAGNOSIS — G8929 Other chronic pain: Secondary | ICD-10-CM

## 2016-01-10 DIAGNOSIS — E538 Deficiency of other specified B group vitamins: Secondary | ICD-10-CM

## 2016-01-10 DIAGNOSIS — E8881 Metabolic syndrome: Secondary | ICD-10-CM

## 2016-01-10 DIAGNOSIS — E559 Vitamin D deficiency, unspecified: Secondary | ICD-10-CM | POA: Diagnosis not present

## 2016-01-10 DIAGNOSIS — E049 Nontoxic goiter, unspecified: Secondary | ICD-10-CM

## 2016-01-10 DIAGNOSIS — R102 Pelvic and perineal pain: Secondary | ICD-10-CM

## 2016-01-10 DIAGNOSIS — Z862 Personal history of diseases of the blood and blood-forming organs and certain disorders involving the immune mechanism: Secondary | ICD-10-CM

## 2016-01-10 LAB — COMPLETE METABOLIC PANEL WITH GFR
ALT: 12 U/L (ref 6–29)
AST: 13 U/L (ref 10–30)
Albumin: 4.2 g/dL (ref 3.6–5.1)
Alkaline Phosphatase: 44 U/L (ref 33–115)
BILIRUBIN TOTAL: 0.6 mg/dL (ref 0.2–1.2)
BUN: 9 mg/dL (ref 7–25)
CHLORIDE: 108 mmol/L (ref 98–110)
CO2: 25 mmol/L (ref 20–31)
Calcium: 9.7 mg/dL (ref 8.6–10.2)
Creat: 0.85 mg/dL (ref 0.50–1.10)
GFR, EST NON AFRICAN AMERICAN: 85 mL/min (ref >=60)
GLUCOSE: 98 mg/dL (ref 65–99)
POTASSIUM: 4.4 mmol/L (ref 3.5–5.3)
SODIUM: 141 mmol/L (ref 135–146)
Total Protein: 6.2 g/dL (ref 6.1–8.1)

## 2016-01-10 LAB — LIPID PANEL
Cholesterol: 131 mg/dL (ref 125–200)
HDL: 67 mg/dL (ref >=46)
LDL CALC: 55 mg/dL (ref <130)
TRIGLYCERIDES: 45 mg/dL (ref <150)
Total CHOL/HDL Ratio: 2 Ratio (ref <=5.0)
VLDL: 9 mg/dL (ref <30)

## 2016-01-10 LAB — CBC WITH DIFFERENTIAL/PLATELET
BASOS PCT: 0 %
Basophils Absolute: 0 cells/uL (ref 0–200)
EOS ABS: 234 {cells}/uL (ref 15–500)
EOS PCT: 3 %
HCT: 44.4 % (ref 35.0–45.0)
Hemoglobin: 14.6 g/dL (ref 11.7–15.5)
LYMPHS PCT: 35 %
Lymphs Abs: 2730 cells/uL (ref 850–3900)
MCH: 28.9 pg (ref 27.0–33.0)
MCHC: 32.9 g/dL (ref 32.0–36.0)
MCV: 87.7 fL (ref 80.0–100.0)
MONOS PCT: 5 %
MPV: 9.4 fL (ref 7.5–12.5)
Monocytes Absolute: 390 cells/uL (ref 200–950)
NEUTROS ABS: 4446 {cells}/uL (ref 1500–7800)
Neutrophils Relative %: 57 %
PLATELETS: 291 10*3/uL (ref 140–400)
RBC: 5.06 MIL/uL (ref 3.80–5.10)
RDW: 13.7 % (ref 11.0–15.0)
WBC: 7.8 10*3/uL (ref 3.8–10.8)

## 2016-01-10 LAB — FERRITIN: FERRITIN: 43 ng/mL (ref 10–232)

## 2016-01-10 LAB — VITAMIN B12: Vitamin B-12: 671 pg/mL (ref 200–1100)

## 2016-01-10 MED ORDER — DULOXETINE HCL 30 MG PO CPEP: 30.0000 mg | ORAL_CAPSULE | Freq: Every day | ORAL | 0 refills | Status: DC

## 2016-01-10 MED ORDER — B-12 1000 MCG SL SUBL: 1.0000 mL | SUBLINGUAL_TABLET | Freq: Every day | SUBLINGUAL | 0 refills | Status: DC

## 2016-01-10 MED ORDER — TEMAZEPAM 30 MG PO CAPS: 30.0000 mg | ORAL_CAPSULE | Freq: Every evening | ORAL | 2 refills | Status: DC | PRN

## 2016-01-10 MED ORDER — LIRAGLUTIDE -WEIGHT MANAGEMENT 18 MG/3ML ~~LOC~~ SOPN
3.0000 mg | PEN_INJECTOR | Freq: Every day | SUBCUTANEOUS | 0 refills | Status: DC
Start: 2016-01-10 — End: 2016-01-13

## 2016-01-10 MED ORDER — HYDROCHLOROTHIAZIDE 12.5 MG PO TABS
12.5000 mg | ORAL_TABLET | Freq: Every day | ORAL | 3 refills | Status: DC
Start: 2016-01-10 — End: 2016-07-31
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
## 2016-01-11 LAB — THYROID PANEL WITH TSH
Free Thyroxine Index: 2.2 (ref 1.4–3.8)
T3 UPTAKE: 31 % (ref 22–35)
T4, Total: 7.1 ug/dL (ref 4.5–12.0)
TSH: 0.66 mIU/L

## 2016-01-11 LAB — HEMOGLOBIN A1C
HEMOGLOBIN A1C: 5.4 % (ref <5.7)
MEAN PLASMA GLUCOSE: 108 mg/dL

## 2016-01-11 LAB — VITAMIN D 25 HYDROXY (VIT D DEFICIENCY, FRACTURES): VIT D 25 HYDROXY: 37 ng/mL (ref 30–100)

## 2016-01-13 ENCOUNTER — Telehealth: Payer: Self-pay | Admitting: Family Medicine

## 2016-01-13 ENCOUNTER — Other Ambulatory Visit: Payer: Self-pay | Admitting: Family Medicine

## 2016-01-13 MED ORDER — LIRAGLUTIDE 18 MG/3ML ~~LOC~~ SOPN: 1.8000 mg | PEN_INJECTOR | SUBCUTANEOUS | 2 refills | Status: DC

## 2016-01-13 NOTE — Telephone Encounter (Signed)
Patient wants to know if you can change her weight loss medication to Victoza from [place] due to coverage.

## 2016-01-13 NOTE — Telephone Encounter (Signed)
Changed for pre-diabetes/metabolic syndrome diagnosis

## 2016-01-14 NOTE — Telephone Encounter (Signed)
Patient notified via My Chart

## 2016-01-16 ENCOUNTER — Ambulatory Visit
Admission: RE | Admit: 2016-01-16 | Discharge: 2016-01-16 | Disposition: A | Discharge status: Home / Self Care | Payer: Federal, State, Local not specified - PPO | Source: Ambulatory Visit | Attending: Family Medicine | Admitting: Family Medicine

## 2016-01-16 DIAGNOSIS — E041 Nontoxic single thyroid nodule: Secondary | ICD-10-CM | POA: Insufficient documentation

## 2016-01-16 DIAGNOSIS — E049 Nontoxic goiter, unspecified: Secondary | ICD-10-CM | POA: Diagnosis present

## 2016-01-19 ENCOUNTER — Other Ambulatory Visit: Payer: Self-pay | Admitting: Family Medicine

## 2016-01-19 DIAGNOSIS — E041 Nontoxic single thyroid nodule: Secondary | ICD-10-CM

## 2016-01-22 NOTE — ED Provider Notes (Signed)
CSN: [id number]     Arrival date & time 01/05/16  1400 History   None    Chief Complaint  Patient presents with  . Shortness of Breath   (Consider location/radiation/quality/duration/timing/severity/associated sxs/prior Treatment) HPI 40 y/o female cc chest tightness for a couple of days. Not related to cardiac, SOB wheezing, out of meds at home. No chest pain, tightness from COPD Past Medical History:  Diagnosis Date  . Anxiety    no current meds  . Arthritis    left knee pain - otc med prn  . Chronic insomnia   . Depression   . Diabetes mellitus without complication ([place])   . Dysrhythmia    Home with daughter [name]  . Endometriosis   . GERD (gastroesophageal reflux disease)    diet control - no meds  . Headache(784.0)    hx - last one 2 yrs ago - no meds  . Hypertension   . Metabolic syndrome   . Obesity   . Serum calcium elevated   . Sleep disorder, circadian, shift work type   . SVD (spontaneous vaginal delivery)    x 2  . Tachycardia    History - neg cardiac tests-stress related   Past Surgical History:  Procedure Laterality Date  . DILATION AND CURETTAGE OF UTERUS    . EYE SURGERY  06/28/13   right laser eye surgery - repair retina  . HYSTEROSCOPY W/D&C N/A 07/19/2013   Procedure: DILATATION AND CURETTAGE /HYSTEROSCOPY;  Surgeon: Marvene Staff, MD;  Location: [facility];  Service: Gynecology;  Laterality: N/A;  . INDUCED ABORTION    . LAPAROSCOPY  1992   endometriosis  . LAPAROSCOPY N/A 07/19/2013   Procedure: LAPAROSCOPY DIAGNOSTIC  with resection of endometriosis;  Surgeon: Marvene Staff, MD;  Location: [facility];  Service: Gynecology;  Laterality: N/A;  . metatarsil      left foot surgery    Family History  Problem Relation Age of Onset  . Diabetes Mother   . Hypertension Mother   . Hypertension Father   . Kidney disease Father   . Anesthesia problems Neg Hx   . Hypotension Neg Hx   . Malignant hyperthermia Neg Hx   . Pseudochol  deficiency Neg Hx   . Heart disease Brother    Social History  Substance Use Topics  . Smoking status: Current Every Day Smoker    Packs/day: 0.50    Years: 21.00    Types: Cigarettes    Start date: 11/25/1993  . Smokeless tobacco: Never Used  . Alcohol use No   OB History    Gravida Para Term Preterm AB Living   6 2 2  0 4 2   SAB TAB Ectopic Multiple Live Births   2 2 0 0       Review of Systems  Denies: HEADACHE, NAUSEA, ABDOMINAL PAIN, CHEST PAIN, CONGESTION, DYSURIA, SHORTNESS OF BREATH  Allergies  Review of patient's allergies indicates no known allergies.  Home Medications   Prior to Admission medications   Medication Sig Start Date End Date Taking? Authorizing Provider  Cyanocobalamin (B-12) 1000 MCG SUBL Place 1 mL under the tongue daily. 01/10/16   Steele Sizer, MD  DULoxetine (CYMBALTA) 30 MG capsule Take 1-2 capsules (30-60 mg total) by mouth daily. 01/10/16   Steele Sizer, MD  hydrochlorothiazide (HYDRODIURIL) 12.5 MG tablet Take 1 tablet (12.5 mg total) by mouth daily. 01/10/16   Steele Sizer, MD  ibuprofen (ADVIL,MOTRIN) 800 MG tablet Take 800 mg by mouth. 01/06/16  01/05/17  Historical Provider, MD  Liraglutide (VICTOZA) 18 MG/3ML SOPN Inject 0.3 mLs (1.8 mg total) into the skin every morning. 01/13/16   Steele Sizer, MD  Multiple Vitamin (MULTI-VITAMINS) TABS Take by mouth.    Historical Provider, MD  norethindrone (AYGESTIN) 5 MG tablet Take 5 mg by mouth. 01/06/16   Historical Provider, MD  temazepam (RESTORIL) 30 MG capsule Take 1 capsule (30 mg total) by mouth at bedtime as needed. for sleep 01/10/16   Steele Sizer, MD   Meds Ordered and Administered this Visit  Medications - No data to display  BP 137/91 (BP Location: Right Arm)   Pulse 85   Temp 98.4 F (36.9 C) (Oral)   SpO2 99%  No data found.   Physical Exam NURSES NOTES AND VITAL SIGNS REVIEWED. CONSTITUTIONAL: Well developed, well nourished, no acute distress HEENT: normocephalic,  atraumatic EYES: Conjunctiva normal NECK:normal ROM, supple, no adenopathy PULMONARY:No respiratory distress, normal effort, few diffuse wheezes.  ABDOMINAL: Soft, ND, NT BS+, No CVAT MUSCULOSKELETAL: Normal ROM of all extremities,  SKIN: warm and dry without rash PSYCHIATRIC: Mood and affect, behavior are normal  Urgent Care Course   Clinical Course    Procedures (including critical care time)  Labs Review Labs Reviewed - No data to display  Imaging Review No results found.   Visual Acuity Review  Right Eye Distance:   Left Eye Distance:   Bilateral Distance:    Right Eye Near:   Left Eye Near:    Bilateral Near:        ECG rate of 74, normal axis, and intervals.  No acute changes  Pt is not having cardiac symptoms. Related to smoking and COPD she is out of inhaler at home.  Does not feel she needs neb treatment, afraid of steroids.  MDM   1. COPD exacerbation ([place])     Patient is reassured that there are no issues that require transfer to higher level of care at this time or additional tests. Patient is advised to continue home symptomatic treatment. Patient is advised that if there are new or worsening symptoms to attend the emergency department, contact primary care provider, or return to UC. Instructions of care provided discharged home in stable condition.    THIS NOTE WAS GENERATED USING A VOICE RECOGNITION SOFTWARE PROGRAM. ALL REASONABLE EFFORTS  WERE MADE TO PROOFREAD THIS DOCUMENT FOR ACCURACY.  I have verbally reviewed the discharge instructions with the patient. A printed AVS was given to the patient.  All questions were answered prior to discharge.      Konrad Felix, PA 01/22/16 1020

## 2016-02-11 ENCOUNTER — Encounter: Payer: Self-pay | Admitting: Family Medicine

## 2016-02-11 ENCOUNTER — Ambulatory Visit (INDEPENDENT_AMBULATORY_CARE_PROVIDER_SITE_OTHER): Payer: Federal, State, Local not specified - PPO | Admitting: Family Medicine

## 2016-02-11 VITALS — BP 128/68 | HR 110 | Temp 98.7°F | Resp 18 | Ht 67.0 in | Wt 187.6 lb

## 2016-02-11 DIAGNOSIS — N949 Unspecified condition associated with female genital organs and menstrual cycle: Secondary | ICD-10-CM

## 2016-02-11 DIAGNOSIS — E041 Nontoxic single thyroid nodule: Secondary | ICD-10-CM

## 2016-02-11 DIAGNOSIS — E669 Obesity, unspecified: Secondary | ICD-10-CM

## 2016-02-11 DIAGNOSIS — E8881 Metabolic syndrome: Secondary | ICD-10-CM

## 2016-02-11 DIAGNOSIS — Z23 Encounter for immunization: Secondary | ICD-10-CM | POA: Diagnosis not present

## 2016-02-11 DIAGNOSIS — F329 Major depressive disorder, single episode, unspecified: Secondary | ICD-10-CM | POA: Diagnosis not present

## 2016-02-11 DIAGNOSIS — R102 Pelvic and perineal pain: Secondary | ICD-10-CM

## 2016-02-11 DIAGNOSIS — G8929 Other chronic pain: Secondary | ICD-10-CM

## 2016-02-11 MED ORDER — DULOXETINE HCL 60 MG PO CPEP: 60.0000 mg | ORAL_CAPSULE | Freq: Every day | ORAL | 2 refills | Status: DC

## 2016-02-11 MED ORDER — LIRAGLUTIDE 18 MG/3ML ~~LOC~~ SOPN: 1.8000 mg | PEN_INJECTOR | SUBCUTANEOUS | 2 refills | Status: DC
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
## 2016-02-27 DIAGNOSIS — E041 Nontoxic single thyroid nodule: Secondary | ICD-10-CM | POA: Diagnosis not present

## 2016-03-10 DIAGNOSIS — E041 Nontoxic single thyroid nodule: Secondary | ICD-10-CM | POA: Diagnosis not present

## 2016-04-07 ENCOUNTER — Telehealth: Payer: Self-pay | Admitting: Family Medicine

## 2016-04-07 NOTE — Telephone Encounter (Signed)
I don't understand it. I can't change dose to decrease cost, it is considered fraud

## 2016-04-07 NOTE — Telephone Encounter (Signed)
Patient is currently taking 12.5 mg daily of the hydrochlorothiazide and due to the increased cost patient is wanting to increase the dosage to 25mg .  Patient uses the [facility] on [address] in [place].

## 2016-04-10 DIAGNOSIS — N83292 Other ovarian cyst, left side: Secondary | ICD-10-CM | POA: Diagnosis not present

## 2016-04-10 DIAGNOSIS — D252 Subserosal leiomyoma of uterus: Secondary | ICD-10-CM | POA: Diagnosis not present

## 2016-04-10 DIAGNOSIS — N83202 Unspecified ovarian cyst, left side: Secondary | ICD-10-CM | POA: Diagnosis not present

## 2016-04-20 DIAGNOSIS — N939 Abnormal uterine and vaginal bleeding, unspecified: Secondary | ICD-10-CM | POA: Diagnosis not present

## 2016-04-28 ENCOUNTER — Encounter: Payer: Self-pay | Admitting: Family Medicine

## 2016-05-13 ENCOUNTER — Ambulatory Visit (INDEPENDENT_AMBULATORY_CARE_PROVIDER_SITE_OTHER): Payer: Federal, State, Local not specified - PPO | Admitting: Family Medicine

## 2016-05-13 ENCOUNTER — Encounter: Payer: Self-pay | Admitting: Family Medicine

## 2016-05-13 VITALS — BP 124/69 | HR 110 | Temp 98.1°F | Wt 210.0 lb

## 2016-05-13 DIAGNOSIS — F329 Major depressive disorder, single episode, unspecified: Secondary | ICD-10-CM

## 2016-05-13 DIAGNOSIS — E669 Obesity, unspecified: Secondary | ICD-10-CM | POA: Diagnosis not present

## 2016-05-13 DIAGNOSIS — G4700 Insomnia, unspecified: Secondary | ICD-10-CM | POA: Diagnosis not present

## 2016-05-13 DIAGNOSIS — E8881 Metabolic syndrome: Secondary | ICD-10-CM | POA: Diagnosis not present

## 2016-05-13 MED ORDER — BUPROPION HCL ER (XL) 150 MG PO TB24: 150.0000 mg | ORAL_TABLET | Freq: Every day | ORAL | 2 refills | Status: DC

## 2016-05-13 MED ORDER — TEMAZEPAM 30 MG PO CAPS: 30.0000 mg | ORAL_CAPSULE | Freq: Every evening | ORAL | 2 refills | Status: DC | PRN

## 2016-05-13 MED ORDER — DULOXETINE HCL 60 MG PO CPEP: 60.0000 mg | ORAL_CAPSULE | Freq: Every day | ORAL | 2 refills | Status: DC
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
## 2016-05-21 DIAGNOSIS — N803 Endometriosis of pelvic peritoneum: Secondary | ICD-10-CM | POA: Diagnosis not present

## 2016-05-21 DIAGNOSIS — N83202 Unspecified ovarian cyst, left side: Secondary | ICD-10-CM | POA: Diagnosis not present

## 2016-05-21 DIAGNOSIS — D252 Subserosal leiomyoma of uterus: Secondary | ICD-10-CM | POA: Diagnosis not present

## 2016-05-21 DIAGNOSIS — D219 Benign neoplasm of connective and other soft tissue, unspecified: Secondary | ICD-10-CM | POA: Diagnosis not present

## 2016-05-21 DIAGNOSIS — R102 Pelvic and perineal pain: Secondary | ICD-10-CM | POA: Diagnosis not present

## 2016-05-21 DIAGNOSIS — I10 Essential (primary) hypertension: Secondary | ICD-10-CM | POA: Diagnosis not present

## 2016-05-21 DIAGNOSIS — K5900 Constipation, unspecified: Secondary | ICD-10-CM | POA: Diagnosis not present

## 2016-05-21 DIAGNOSIS — K649 Unspecified hemorrhoids: Secondary | ICD-10-CM | POA: Diagnosis not present

## 2016-05-21 DIAGNOSIS — N9412 Deep dyspareunia: Secondary | ICD-10-CM | POA: Diagnosis not present

## 2016-05-21 DIAGNOSIS — R7303 Prediabetes: Secondary | ICD-10-CM | POA: Diagnosis not present

## 2016-05-21 DIAGNOSIS — E669 Obesity, unspecified: Secondary | ICD-10-CM | POA: Diagnosis not present

## 2016-05-21 DIAGNOSIS — N939 Abnormal uterine and vaginal bleeding, unspecified: Secondary | ICD-10-CM | POA: Diagnosis not present

## 2016-05-21 DIAGNOSIS — F17210 Nicotine dependence, cigarettes, uncomplicated: Secondary | ICD-10-CM | POA: Diagnosis not present

## 2016-05-21 DIAGNOSIS — M545 Low back pain: Secondary | ICD-10-CM | POA: Diagnosis not present

## 2016-05-21 DIAGNOSIS — N8302 Follicular cyst of left ovary: Secondary | ICD-10-CM | POA: Diagnosis not present

## 2016-05-21 DIAGNOSIS — G8929 Other chronic pain: Secondary | ICD-10-CM | POA: Diagnosis not present

## 2016-05-21 DIAGNOSIS — K30 Functional dyspepsia: Secondary | ICD-10-CM | POA: Diagnosis not present

## 2016-05-21 HISTORY — PX: ABDOMINAL HYSTERECTOMY: SHX81

## 2016-05-22 ENCOUNTER — Inpatient Hospital Stay (HOSPITAL_COMMUNITY): Payer: Federal, State, Local not specified - PPO

## 2016-05-22 ENCOUNTER — Encounter (HOSPITAL_COMMUNITY): Payer: Self-pay | Admitting: *Deleted

## 2016-05-22 ENCOUNTER — Inpatient Hospital Stay (HOSPITAL_COMMUNITY)
Admission: AD | Admit: 2016-05-22 | Discharge: 2016-05-22 | Disposition: A | Discharge status: Home / Self Care | Payer: Federal, State, Local not specified - PPO | Source: Ambulatory Visit | Attending: Obstetrics and Gynecology | Admitting: Obstetrics and Gynecology

## 2016-05-22 DIAGNOSIS — R1909 Other intra-abdominal and pelvic swelling, mass and lump: Secondary | ICD-10-CM | POA: Diagnosis not present

## 2016-05-22 DIAGNOSIS — F329 Major depressive disorder, single episode, unspecified: Secondary | ICD-10-CM | POA: Insufficient documentation

## 2016-05-22 DIAGNOSIS — R748 Abnormal levels of other serum enzymes: Secondary | ICD-10-CM | POA: Diagnosis not present

## 2016-05-22 DIAGNOSIS — F419 Anxiety disorder, unspecified: Secondary | ICD-10-CM | POA: Diagnosis not present

## 2016-05-22 DIAGNOSIS — K219 Gastro-esophageal reflux disease without esophagitis: Secondary | ICD-10-CM | POA: Insufficient documentation

## 2016-05-22 DIAGNOSIS — T819XXA Unspecified complication of procedure, initial encounter: Secondary | ICD-10-CM

## 2016-05-22 DIAGNOSIS — Y838 Other surgical procedures as the cause of abnormal reaction of the patient, or of later complication, without mention of misadventure at the time of the procedure: Secondary | ICD-10-CM | POA: Insufficient documentation

## 2016-05-22 DIAGNOSIS — Z833 Family history of diabetes mellitus: Secondary | ICD-10-CM | POA: Diagnosis not present

## 2016-05-22 DIAGNOSIS — E669 Obesity, unspecified: Secondary | ICD-10-CM | POA: Diagnosis not present

## 2016-05-22 DIAGNOSIS — Z8742 Personal history of other diseases of the female genital tract: Secondary | ICD-10-CM | POA: Insufficient documentation

## 2016-05-22 DIAGNOSIS — Z90722 Acquired absence of ovaries, bilateral: Secondary | ICD-10-CM | POA: Insufficient documentation

## 2016-05-22 DIAGNOSIS — I10 Essential (primary) hypertension: Secondary | ICD-10-CM | POA: Insufficient documentation

## 2016-05-22 DIAGNOSIS — R140 Abdominal distension (gaseous): Secondary | ICD-10-CM | POA: Diagnosis present

## 2016-05-22 DIAGNOSIS — Z90710 Acquired absence of both cervix and uterus: Secondary | ICD-10-CM | POA: Diagnosis not present

## 2016-05-22 DIAGNOSIS — Z79899 Other long term (current) drug therapy: Secondary | ICD-10-CM | POA: Insufficient documentation

## 2016-05-22 DIAGNOSIS — N9989 Other postprocedural complications and disorders of genitourinary system: Secondary | ICD-10-CM | POA: Diagnosis not present

## 2016-05-22 DIAGNOSIS — R338 Other retention of urine: Secondary | ICD-10-CM | POA: Diagnosis not present

## 2016-05-22 DIAGNOSIS — Z841 Family history of disorders of kidney and ureter: Secondary | ICD-10-CM | POA: Diagnosis not present

## 2016-05-22 DIAGNOSIS — F17210 Nicotine dependence, cigarettes, uncomplicated: Secondary | ICD-10-CM | POA: Diagnosis not present

## 2016-05-22 DIAGNOSIS — K5900 Constipation, unspecified: Secondary | ICD-10-CM | POA: Diagnosis not present

## 2016-05-22 DIAGNOSIS — Z8249 Family history of ischemic heart disease and other diseases of the circulatory system: Secondary | ICD-10-CM | POA: Insufficient documentation

## 2016-05-22 LAB — CBC
HCT: 35.7 % — ABNORMAL LOW (ref 36.0–46.0)
HEMOGLOBIN: 11.5 g/dL — AB (ref 12.0–15.0)
MCH: 29.4 pg (ref 26.0–34.0)
MCHC: 32.2 g/dL (ref 30.0–36.0)
MCV: 91.3 fL (ref 78.0–100.0)
Platelets: 239 10*3/uL (ref 150–400)
RBC: 3.91 MIL/uL (ref 3.87–5.11)
RDW: 14.3 % (ref 11.5–15.5)
WBC: 12.3 10*3/uL — AB (ref 4.0–10.5)

## 2016-05-22 LAB — COMPREHENSIVE METABOLIC PANEL
ALBUMIN: 3.8 g/dL (ref 3.5–5.0)
ALK PHOS: 40 U/L (ref 38–126)
ALT: 168 U/L — ABNORMAL HIGH (ref 14–54)
ANION GAP: 7 (ref 5–15)
AST: 60 U/L — AB (ref 15–41)
BUN: 14 mg/dL (ref 6–20)
CALCIUM: 9.1 mg/dL (ref 8.9–10.3)
CO2: 27 mmol/L (ref 22–32)
Chloride: 106 mmol/L (ref 101–111)
Creatinine, Ser: 0.82 mg/dL (ref 0.44–1.00)
GFR calc Af Amer: >60 mL/min (ref >60)
GFR calc non Af Amer: >60 mL/min (ref >60)
GLUCOSE: 121 mg/dL — AB (ref 65–99)
POTASSIUM: 3.8 mmol/L (ref 3.5–5.1)
SODIUM: 140 mmol/L (ref 135–145)
Total Bilirubin: 0.6 mg/dL (ref 0.3–1.2)
Total Protein: 6.3 g/dL — ABNORMAL LOW (ref 6.5–8.1)

## 2016-05-22 LAB — AMYLASE: Amylase: 102 U/L — ABNORMAL HIGH (ref 28–100)

## 2016-05-22 LAB — LIPASE, BLOOD: Lipase: 32 U/L (ref 11–51)

## 2016-05-22 NOTE — Progress Notes (Signed)
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 Cannot void at all at present.

## 2016-05-22 NOTE — Progress Notes (Signed)
Converted foley bag over to leg bag.

## 2016-05-22 NOTE — MAU Provider Note (Signed)
History     CSN: [id number]  Arrival date and time: 05/22/16 5974   None     Chief Complaint  Patient presents with  . Post-op Problem   HPI [name] is a 40 y.o. female here in MAU with post-op concerns. She has a history of chronic pelvic pain, AUB. Known history of stage 1 pelvic endometriosis. She underwent a total Laparoscopic Hysterectomy, Bilateral Salpingectomy, and Cystoscopy on 12/21 with [facility]. She called her Dr. This morning and told her that she was concerned about leg swelling, and abdominal distention. She is also having trouble urinating. She sat on the toilet yesterday for over and hour trying to urinate. She was able to eventually urinate. Her last void was at 0600 this morning. No blood in the urine.   OB History    Gravida Para Term Preterm AB Living   _0 0 4 2   SAB TAB Ectopic Multiple Live Births   2 2 0 0        Past Medical History:  Diagnosis Date  . Anxiety    no current meds  . Arthritis    left knee pain - otc med prn  . Chronic insomnia   . Depression   . Dysrhythmia    Home with daughter Stacey Stanley  . Endometriosis   . GERD (gastroesophageal reflux disease)    diet control - no meds  . Headache(784.0)    hx - last one 2 yrs ago - no meds  . Hypertension   . Metabolic syndrome   . Obesity   . Serum calcium elevated   . Sleep disorder, circadian, shift work type   . SVD (spontaneous vaginal delivery)    x 2  . Tachycardia    History - neg cardiac tests-stress related    Past Surgical History:  Procedure Laterality Date  . DILATION AND CURETTAGE OF UTERUS    . EYE SURGERY  06/28/13   right laser eye surgery - repair retina  . HYSTEROSCOPY W/D&C N/A 07/19/2013   Procedure: DILATATION AND CURETTAGE /HYSTEROSCOPY;  Surgeon: Marvene Staff, MD;  Location: [facility];  Service: Gynecology;  Laterality: N/A;  . INDUCED ABORTION    . LAPAROSCOPY  1992   endometriosis  . LAPAROSCOPY N/A 07/19/2013    Procedure: LAPAROSCOPY DIAGNOSTIC  with resection of endometriosis;  Surgeon: Marvene Staff, MD;  Location: [facility];  Service: Gynecology;  Laterality: N/A;  . metatarsil      left foot surgery     Family History  Problem Relation Age of Onset  . Diabetes Mother   . Hypertension Mother   . Hypertension Father   . Kidney disease Father   . Heart disease Brother   . Anesthesia problems Neg Hx   . Hypotension Neg Hx   . Malignant hyperthermia Neg Hx   . Pseudochol deficiency Neg Hx     Social History  Substance Use Topics  . Smoking status: Current Every Day Smoker    Packs/day: 0.50    Years: 21.00    Types: Cigarettes    Start date: 11/25/1993  . Smokeless tobacco: Never Used  . Alcohol use No    Allergies: No Known Allergies  Prescriptions Prior to Admission  Medication Sig Dispense Refill Last Dose  . buPROPion (WELLBUTRIN XL) 150 MG 24 hr tablet Take 1 tablet (150 mg total) by mouth daily. 30 tablet 2 Past Week at Unknown time  . Cyanocobalamin (B-12) 1000 MCG SUBL Place  1 mL under the tongue daily. 30 each 0 Past Week at Unknown time  . cyclobenzaprine (FLEXERIL) 10 MG tablet Take 10 mg by mouth 2 (two) times daily as needed. For pelvic pain    Past Week at Unknown time  . DULoxetine (CYMBALTA) 60 MG capsule Take 1 capsule (60 mg total) by mouth daily. 30 capsule 2 Past Week at Unknown time  . hydrochlorothiazide (HYDRODIURIL) 12.5 MG tablet Take 1 tablet (12.5 mg total) by mouth daily. 90 tablet 3 05/22/2016 at Unknown time  . ibuprofen (ADVIL,MOTRIN) 800 MG tablet Take 800 mg by mouth.   Past Week at Unknown time  . Multiple Vitamin (MULTI-VITAMINS) TABS Take by mouth.   Past Week at Unknown time  . temazepam (RESTORIL) 30 MG capsule Take 1 capsule (30 mg total) by mouth at bedtime as needed. for sleep 30 capsule 2 Past Month at Unknown time   Recent Results (from the past 2160 hour(s))  CBC     Status: Abnormal   Collection Time: 05/22/16 10:55 AM  Result Value  Ref Range   WBC 12.3 (H) 4.0 - 10.5 K/uL   RBC 3.91 3.87 - 5.11 MIL/uL   Hemoglobin 11.5 (L) 12.0 - 15.0 g/dL   HCT 35.7 (L) 36.0 - 46.0 %   MCV 91.3 78.0 - 100.0 fL   MCH 29.4 26.0 - 34.0 pg   MCHC 32.2 30.0 - 36.0 g/dL   RDW 14.3 11.5 - 15.5 %   Platelets 239 150 - 400 K/uL  Comprehensive metabolic panel     Status: Abnormal   Collection Time: 05/22/16 10:55 AM  Result Value Ref Range   Sodium 140 135 - 145 mmol/L   Potassium 3.8 3.5 - 5.1 mmol/L   Chloride 106 101 - 111 mmol/L   CO2 27 22 - 32 mmol/L   Glucose, Bld 121 (H) 65 - 99 mg/dL   BUN 14 6 - 20 mg/dL   Creatinine, Ser 0.82 0.44 - 1.00 mg/dL   Calcium 9.1 8.9 - 10.3 mg/dL   Total Protein 6.3 (L) 6.5 - 8.1 g/dL   Albumin 3.8 3.5 - 5.0 g/dL   AST 60 (H) 15 - 41 U/L   ALT 168 (H) 14 - 54 U/L   Alkaline Phosphatase 40 38 - 126 U/L   Total Bilirubin 0.6 0.3 - 1.2 mg/dL   GFR calc non Af Amer >60 >60 mL/min   GFR calc Af Amer >60 >60 mL/min    Comment: (NOTE) The eGFR has been calculated using the CKD EPI equation. This calculation has not been validated in all clinical situations. eGFR's persistently <60 mL/min signify possible Chronic Kidney Disease.    Anion gap 7 5 - 15  Lipase, blood     Status: None   Collection Time: 05/22/16 10:55 AM  Result Value Ref Range   Lipase 32 11 - 51 U/L  Amylase     Status: Abnormal   Collection Time: 05/22/16 10:55 AM  Result Value Ref Range   Amylase 102 (H) 28 - 100 U/L   Dg Abd 2 Views  Result Date: 05/22/2016 CLINICAL DATA:  Status post hysterectomy yesterday. Distended abdomen and swelling. EXAM: ABDOMEN - 2 VIEW COMPARISON:  01/20/2009 FINDINGS: Marked stool burden identified within the colon. There is no evidence of free air. No radio-opaque calculi or other significant radiographic abnormality is seen. IMPRESSION: 1. Nonobstructive bowel gas pattern. 2. Significant stool burden with the colon. Correlate for any clinical signs or symptoms of constipation. Electronically  Signed   By: Kerby Moors M.D.   On: 05/22/2016 11:40    Review of Systems  Constitutional: Negative for chills and fever.  Gastrointestinal: Positive for constipation and vomiting (one episode only ). Negative for blood in stool, diarrhea, melena and nausea.  Genitourinary: Negative for dysuria and flank pain.  Musculoskeletal: Negative for back pain.   Physical Exam   Blood pressure 143/86, pulse 111, temperature 98 F (36.7 C), resp. rate 18, last menstrual period 07/09/2015.  Physical Exam  Constitutional: She is oriented to person, place, and time. She appears well-developed and well-nourished. No distress.  HENT:  Head: Normocephalic.  GI: Bowel sounds are normal. She exhibits distension. There is no hepatosplenomegaly, splenomegaly or hepatomegaly. There is generalized tenderness. There is no rigidity, no rebound and no guarding.  Musculoskeletal: She exhibits edema (2+ pitting edema in bilateral lower extremities ).  Neurological: She is alert and oriented to person, place, and time.  Skin: Skin is warm. She is not diaphoretic.  Psychiatric: Her behavior is normal.    MAU Course  Procedures  None  MDM  Bedside bladder scanner showed 240 cc.  Abdominal Xray Foley Cath placed by nursing staff,  with 600 cc of returned urine.  Pain remains without pain. Patient is without N/V symptoms.  Discussed patient with Dr. Elly Modena.   Assessment and Plan    A:  1. Acute urinary retention   2. Postoperative complication   3. Elevated liver enzymes   4. Constipation, unspecified constipation type     P:  Discharge home in stable condition Patient to call surgeons office on Tuesday for follow up that day.  If patient develops abdominal pain, N/V she is instructed to go to [facility] or [facility].  Return to MAU if further urinary problems Discussed over the counter medications for constipation   Lezlie Lye, NP 05/23/2016 3:04 PM

## 2016-05-22 NOTE — Progress Notes (Signed)
Bladder Scan 240 ml

## 2016-05-22 NOTE — Discharge Instructions (Signed)
Acute Urinary Retention, Female Urinary retention means you are unable to pee completely or at all (empty your bladder). Follow these instructions at home:  Drink enough fluids to keep your pee (urine) clear or pale yellow.  If you are sent home with a tube that drains the bladder (catheter), there will be a drainage bag attached to it. There are two types of bags. One is big that you can wear at night without having to empty it. One is smaller and needs to be emptied more often.  Keep the drainage bag emptied.  Keep the drainage bag lower than the tube.  Only take medicine as told by your doctor. Contact a doctor if:  You have a low-grade fever.  You have spasms or you are leaking pee when you have spasms. Get help right away if:  You have chills or a fever.  Your catheter stops draining pee.  Your catheter falls out.  You have increased bleeding that does not stop after you have rested and increased the amount of fluids you had been drinking. This information is not intended to replace advice given to you by your health care provider. Make sure you discuss any questions you have with your health care provider. Document Released: 11/04/2007 Document Revised: 10/24/2015 Document Reviewed: 10/27/2012 Elsevier Interactive Patient Education  2017 Elsevier Inc.  

## 2016-05-22 NOTE — MAU Note (Signed)
Had robotic surgery yesterday, hysterectomy.   Is having a lot of pain.  abd is distended, legs are swollen. Having difficulty urininating.  Will have, but hardly goes.

## 2016-06-03 ENCOUNTER — Telehealth: Payer: Self-pay | Admitting: Family Medicine

## 2016-06-03 NOTE — Telephone Encounter (Signed)
Come in for follow up

## 2016-06-03 NOTE — Telephone Encounter (Signed)
Pt would like a call back about some labs that were done at the ER over the weekend.

## 2016-06-03 NOTE — Telephone Encounter (Signed)
Pt states she is unable to come in at this time due to just having surgery. Pt still request a call back.

## 2016-06-03 NOTE — Telephone Encounter (Signed)
Patient states the ER told her through her blood work the Liver Enzymes are elevated. But patient states she just went through surgery- hysterectomy at [facility] 05/21/16.

## 2016-06-03 NOTE — Telephone Encounter (Signed)
Okay. We can discuss it with her on her next follow up

## 2016-06-04 NOTE — Telephone Encounter (Signed)
Patient notified

## 2016-07-01 DIAGNOSIS — R1030 Lower abdominal pain, unspecified: Secondary | ICD-10-CM | POA: Diagnosis not present

## 2016-07-01 DIAGNOSIS — M6281 Muscle weakness (generalized): Secondary | ICD-10-CM | POA: Diagnosis not present

## 2016-07-01 DIAGNOSIS — M62838 Other muscle spasm: Secondary | ICD-10-CM | POA: Diagnosis not present

## 2016-07-01 DIAGNOSIS — R102 Pelvic and perineal pain: Secondary | ICD-10-CM | POA: Diagnosis not present

## 2016-07-03 DIAGNOSIS — R748 Abnormal levels of other serum enzymes: Secondary | ICD-10-CM | POA: Diagnosis not present

## 2016-07-03 DIAGNOSIS — R102 Pelvic and perineal pain: Secondary | ICD-10-CM | POA: Diagnosis not present

## 2016-07-03 DIAGNOSIS — B373 Candidiasis of vulva and vagina: Secondary | ICD-10-CM | POA: Diagnosis not present

## 2016-07-10 DIAGNOSIS — M25551 Pain in right hip: Secondary | ICD-10-CM | POA: Diagnosis not present

## 2016-07-28 ENCOUNTER — Other Ambulatory Visit: Payer: Self-pay | Admitting: Family Medicine

## 2016-07-28 DIAGNOSIS — I10 Essential (primary) hypertension: Secondary | ICD-10-CM

## 2016-07-28 NOTE — Telephone Encounter (Signed)
Pt have prescription for hydrochlorothiazide and would like to get it written for 25mg  because it will only cost her $5 for that mg. Please send updated script to [facility] [address].

## 2016-07-30 NOTE — Telephone Encounter (Signed)
I can write for 25 mg but it will be half of the pills, since the direction will stay take half pill instead of one full pill

## 2016-07-31 MED ORDER — HYDROCHLOROTHIAZIDE 25 MG PO TABS: ORAL_TABLET | ORAL | 0 refills | Status: DC

## 2016-08-02 ENCOUNTER — Encounter (HOSPITAL_COMMUNITY): Payer: Self-pay

## 2016-08-02 ENCOUNTER — Emergency Department (HOSPITAL_COMMUNITY)
Admission: EM | Admit: 2016-08-02 | Discharge: 2016-08-02 | Disposition: A | Discharge status: Home / Self Care | Payer: Federal, State, Local not specified - PPO | Attending: Emergency Medicine | Admitting: Emergency Medicine

## 2016-08-02 DIAGNOSIS — Z79899 Other long term (current) drug therapy: Secondary | ICD-10-CM | POA: Insufficient documentation

## 2016-08-02 DIAGNOSIS — K921 Melena: Secondary | ICD-10-CM

## 2016-08-02 DIAGNOSIS — I10 Essential (primary) hypertension: Secondary | ICD-10-CM | POA: Insufficient documentation

## 2016-08-02 DIAGNOSIS — F17210 Nicotine dependence, cigarettes, uncomplicated: Secondary | ICD-10-CM | POA: Insufficient documentation

## 2016-08-02 DIAGNOSIS — R195 Other fecal abnormalities: Secondary | ICD-10-CM | POA: Insufficient documentation

## 2016-08-02 LAB — CBC
HCT: 42.8 % (ref 36.0–46.0)
Hemoglobin: 13.9 g/dL (ref 12.0–15.0)
MCH: 29.1 pg (ref 26.0–34.0)
MCHC: 32.5 g/dL (ref 30.0–36.0)
MCV: 89.7 fL (ref 78.0–100.0)
PLATELETS: 261 10*3/uL (ref 150–400)
RBC: 4.77 MIL/uL (ref 3.87–5.11)
RDW: 13.2 % (ref 11.5–15.5)
WBC: 7 10*3/uL (ref 4.0–10.5)

## 2016-08-02 LAB — TYPE AND SCREEN
ABO/RH(D): A POS
Antibody Screen: NEGATIVE

## 2016-08-02 LAB — COMPREHENSIVE METABOLIC PANEL
ALK PHOS: 44 U/L (ref 38–126)
ALT: 28 U/L (ref 14–54)
AST: 20 U/L (ref 15–41)
Albumin: 3.8 g/dL (ref 3.5–5.0)
Anion gap: 4 — ABNORMAL LOW (ref 5–15)
BUN: 8 mg/dL (ref 6–20)
CALCIUM: 9.4 mg/dL (ref 8.9–10.3)
CO2: 26 mmol/L (ref 22–32)
CREATININE: 0.86 mg/dL (ref 0.44–1.00)
Chloride: 108 mmol/L (ref 101–111)
Glucose, Bld: 107 mg/dL — ABNORMAL HIGH (ref 65–99)
Potassium: 4.1 mmol/L (ref 3.5–5.1)
Sodium: 138 mmol/L (ref 135–145)
Total Bilirubin: 0.5 mg/dL (ref 0.3–1.2)
Total Protein: 6.4 g/dL — ABNORMAL LOW (ref 6.5–8.1)

## 2016-08-02 LAB — ABO/RH: ABO/RH(D): A POS

## 2016-08-02 LAB — POC OCCULT BLOOD, ED: FECAL OCCULT BLD: NEGATIVE

## 2016-08-02 NOTE — ED Notes (Signed)
Patient Alert and oriented X4. Stable and ambulatory. Patient verbalized understanding of the discharge instructions.  Patient belongings were taken by the patient.  

## 2016-08-02 NOTE — ED Triage Notes (Signed)
Patient complains of bright red rectal bleeding x 2 episodes total x past 36 hours. Denies abdominal pain. No nausea, no vomiting. No distress

## 2016-08-02 NOTE — Discharge Instructions (Signed)
You were seen in the ED today with concern for blood in the stool. Your blood tests were negative. We found no evidence of blood on exam but you could be having bleeding that stops and starts. We would like for you to follow up with your PCP who may ultimately refer to to a gastroenterologist if symptoms continue.   Return with any sudden worsening symptoms or new lightheadedness, chest pain, difficulty breathing, or abdominal pain.

## 2016-08-02 NOTE — ED Provider Notes (Signed)
Emergency Department Provider Note   I have reviewed the triage vital signs and the nursing notes.   HISTORY  Chief Complaint No chief complaint on file.   HPI Stacey Stanley is a 41 y.o. female with PMH of depression, GERD, HTN, and obesity presents to the emergency department for evaluation of bright red blood per rectum. Patient states his symptoms began yesterday when she was urinating and noticed some red in the toilet water. She wiped both in front and behind and noticed that the bright red blood was coming from her rectum. She denies any pain. She does have a history of hemorrhoids and has been taking MiraLAX and a stool softener for the past 2 months to improve her constipation and feels like her hemorrhoids also improved. She is not anticoagulated. She has no history of upper or lower GI bleed but notes that she uses NSAIDs frequently. She is taking a reflux medication, Famotidine for the past several weeks. Today she had a bowel movement and noticed there was some blood mixed in with the stool. It was also bright red. No black, tarry stools. No lightheadedness, difficulty breathing, heart palpitations. No vomiting blood.   Past Medical History:  Diagnosis Date  . Anxiety    no current meds  . Arthritis    left knee pain - otc med prn  . Chronic insomnia   . Depression   . Dysrhythmia    Home with daughter [name]  . Endometriosis   . GERD (gastroesophageal reflux disease)    diet control - no meds  . Headache(784.0)    hx - last one 2 yrs ago - no meds  . Hypertension   . Metabolic syndrome   . Obesity   . Serum calcium elevated   . Sleep disorder, circadian, shift work type   . SVD (spontaneous vaginal delivery)    x 2  . Tachycardia    History - neg cardiac tests-stress related    Patient Active Problem List   Diagnosis Date Noted  . Excessive bleeding in premenopausal period 08/19/2015  . Right thyroid nodule 07/31/2015  . Chronic pelvic pain in  female 04/05/2015  . GERD (gastroesophageal reflux disease) 04/05/2015  . Chronic major depressive disorder 11/25/2014  . Insomnia, persistent 11/25/2014  . Endometriosis 11/25/2014  . Essential (primary) hypertension 11/25/2014  . Dysmetabolic syndrome 123456  . Obesity (BMI 30-39.9) 11/25/2014    Past Surgical History:  Procedure Laterality Date  . DILATION AND CURETTAGE OF UTERUS    . EYE SURGERY  06/28/13   right laser eye surgery - repair retina  . HYSTEROSCOPY W/D&C N/A 07/19/2013   Procedure: DILATATION AND CURETTAGE /HYSTEROSCOPY;  Surgeon: Marvene Staff, MD;  Location: [facility];  Service: Gynecology;  Laterality: N/A;  . INDUCED ABORTION    . LAPAROSCOPY  1992   endometriosis  . LAPAROSCOPY N/A 07/19/2013   Procedure: LAPAROSCOPY DIAGNOSTIC  with resection of endometriosis;  Surgeon: Marvene Staff, MD;  Location: [facility];  Service: Gynecology;  Laterality: N/A;  . metatarsil      left foot surgery     Current Outpatient Rx  . Order #: [id number] Class: Normal  . Order #: [id number] Class: OTC  . Order #: [id number] Class: Historical Med  . Order #: [id number] Class: Normal  . Order #: [id number] Class: Normal  . Order #: [id number] Class: Historical Med  . Order #: [id number] Class: Historical Med  . Order #: [id number] Class: Print    Allergies Patient has no known allergies.  Family  History  Problem Relation Age of Onset  . Diabetes Mother   . Hypertension Mother   . Hypertension Father   . Kidney disease Father   . Heart disease Brother   . Anesthesia problems Neg Hx   . Hypotension Neg Hx   . Malignant hyperthermia Neg Hx   . Pseudochol deficiency Neg Hx     Social History Social History  Substance Use Topics  . Smoking status: Current Every Day Smoker    Packs/day: 0.50    Years: 21.00    Types: Cigarettes    Start date: 11/25/1993  . Smokeless tobacco: Never Used  . Alcohol use No    Review of Systems  Constitutional: No fever/chills Eyes:  No visual changes. ENT: No sore throat. Cardiovascular: Denies chest pain. Respiratory: Denies shortness of breath. Gastrointestinal: No abdominal pain.  No nausea, no vomiting.  No diarrhea.  No constipation. Positive rectal bleeding.  Genitourinary: Negative for dysuria. Musculoskeletal: Negative for back pain. Skin: Negative for rash. Neurological: Negative for headaches, focal weakness or numbness.  10-point ROS otherwise negative.  ____________________________________________   PHYSICAL EXAM:  VITAL SIGNS: ED Triage Vitals  Enc Vitals Group     BP 08/02/16 1348 136/97     Pulse Rate 08/02/16 1348 110     Resp 08/02/16 1348 16     Temp 08/02/16 1348 98.8 F (37.1 C)     Temp Source 08/02/16 1348 Oral     SpO2 08/02/16 1348 100 %     Weight 08/02/16 1349 230 lb (104.3 kg)     Height 08/02/16 1349 5\' 5"  (1.651 m)    Constitutional: Alert and oriented. Well appearing and in no acute distress. Eyes: Conjunctivae are normal.  Head: Atraumatic. Nose: No congestion/rhinnorhea. Mouth/Throat: Mucous membranes are moist.  Oropharynx non-erythematous. Neck: No stridor.   Cardiovascular: Normal rate, regular rhythm. Good peripheral circulation. Grossly normal heart sounds.   Respiratory: Normal respiratory effort.  No retractions. Lungs CTAB. Gastrointestinal: Soft and nontender. No distention. Positive external hemorrhoids. No thrombosed hemorrhoid. No pain with rectal exam. No gross blood or melena.  Musculoskeletal: No lower extremity tenderness nor edema. No gross deformities of extremities. Neurologic:  Normal speech and language. No gross focal neurologic deficits are appreciated.  Skin:  Skin is warm, dry and intact. No rash noted. Psychiatric: Mood and affect are normal. Speech and behavior are normal.  ____________________________________________   LABS (all labs ordered are listed, but only abnormal results are displayed)  Labs Reviewed  COMPREHENSIVE METABOLIC  PANEL - Abnormal; Notable for the following:       Result Value   Glucose, Bld 107 (*)    Total Protein 6.4 (*)    Anion gap 4 (*)    All other components within normal limits  CBC  POC OCCULT BLOOD, ED  TYPE AND SCREEN  ABO/RH   ____________________________________________  RADIOLOGY  None ____________________________________________   PROCEDURES  Procedure(s) performed:   Procedures  None ____________________________________________   INITIAL IMPRESSION / ASSESSMENT AND PLAN / ED COURSE  Pertinent labs & imaging results that were available during my care of the patient were reviewed by me and considered in my medical decision making (see chart for details).  Patient presents to the emergency room in for evaluation of bright red blood per rectum in the setting of hemorrhoids. She has several hemorrhoids on my exam. Rectal exam is otherwise unremarkable. Hemoglobin and hematocrit are within normal limits. No symptoms or signs to suggest brisk bleeding with lagging hemoglobin  or developing symptomatic anemia.   05:50 PM Hemoccult is negative. Patient is feeling well. She will continue taking her GERD medication and treatment for hemorrhoids. She'll follow the primary care physician and seek after neurology referral as needed.  At this time, I do not feel there is any life-threatening condition present. I have reviewed and discussed all results (EKG, imaging, lab, urine as appropriate), exam findings with patient. I have reviewed nursing notes and appropriate previous records.  I feel the patient is safe to be discharged home without further emergent workup. Discussed usual and customary return precautions. Patient and family (if present) verbalize understanding and are comfortable with this plan.  Patient will follow-up with their primary care provider. If they do not have a primary care provider, information for follow-up has been provided to them. All questions have been  answered.  ____________________________________________  FINAL CLINICAL IMPRESSION(S) / ED DIAGNOSES  Final diagnoses:  Blood in stool     MEDICATIONS GIVEN DURING THIS VISIT:  Medications - No data to display   NEW OUTPATIENT MEDICATIONS STARTED DURING THIS VISIT:  New Prescriptions   No medications on file      Note:  This document was prepared using Dragon voice recognition software and may include unintentional dictation errors.  Nanda Quinton, MD Emergency Medicine   Margette Fast, MD 08/02/16 [phone]

## 2016-08-02 NOTE — ED Notes (Signed)
Patient ambulated to room without assistance. No distress noted

## 2016-08-11 ENCOUNTER — Ambulatory Visit: Payer: Federal, State, Local not specified - PPO | Admitting: Family Medicine

## 2016-08-19 DIAGNOSIS — I10 Essential (primary) hypertension: Secondary | ICD-10-CM | POA: Diagnosis not present

## 2016-08-19 DIAGNOSIS — E538 Deficiency of other specified B group vitamins: Secondary | ICD-10-CM | POA: Insufficient documentation

## 2016-08-19 DIAGNOSIS — R7303 Prediabetes: Secondary | ICD-10-CM | POA: Diagnosis not present

## 2016-08-19 DIAGNOSIS — G4700 Insomnia, unspecified: Secondary | ICD-10-CM | POA: Diagnosis not present

## 2016-09-02 DIAGNOSIS — N760 Acute vaginitis: Secondary | ICD-10-CM | POA: Diagnosis not present

## 2016-09-02 DIAGNOSIS — N898 Other specified noninflammatory disorders of vagina: Secondary | ICD-10-CM | POA: Diagnosis not present

## 2016-09-02 DIAGNOSIS — R300 Dysuria: Secondary | ICD-10-CM | POA: Diagnosis not present

## 2016-09-17 DIAGNOSIS — F331 Major depressive disorder, recurrent, moderate: Secondary | ICD-10-CM | POA: Diagnosis not present

## 2016-09-17 DIAGNOSIS — I10 Essential (primary) hypertension: Secondary | ICD-10-CM | POA: Diagnosis not present

## 2016-09-17 DIAGNOSIS — Z0000 Encounter for general adult medical examination without abnormal findings: Secondary | ICD-10-CM | POA: Diagnosis not present

## 2016-09-17 DIAGNOSIS — R7303 Prediabetes: Secondary | ICD-10-CM | POA: Diagnosis not present

## 2016-10-07 DIAGNOSIS — N390 Urinary tract infection, site not specified: Secondary | ICD-10-CM | POA: Diagnosis not present

## 2016-10-07 DIAGNOSIS — R102 Pelvic and perineal pain: Secondary | ICD-10-CM | POA: Diagnosis not present

## 2016-10-15 DIAGNOSIS — R102 Pelvic and perineal pain: Secondary | ICD-10-CM | POA: Diagnosis not present

## 2016-10-28 ENCOUNTER — Encounter: Payer: Self-pay | Admitting: Family Medicine

## 2016-10-28 ENCOUNTER — Ambulatory Visit (INDEPENDENT_AMBULATORY_CARE_PROVIDER_SITE_OTHER): Payer: Federal, State, Local not specified - PPO | Admitting: Family Medicine

## 2016-10-28 VITALS — BP 106/68 | HR 102 | Temp 98.2°F | Resp 16 | Ht 65.0 in | Wt 218.5 lb

## 2016-10-28 DIAGNOSIS — F17200 Nicotine dependence, unspecified, uncomplicated: Secondary | ICD-10-CM | POA: Insufficient documentation

## 2016-10-28 DIAGNOSIS — E8881 Metabolic syndrome: Secondary | ICD-10-CM | POA: Diagnosis not present

## 2016-10-28 DIAGNOSIS — E669 Obesity, unspecified: Secondary | ICD-10-CM | POA: Diagnosis not present

## 2016-10-28 DIAGNOSIS — F341 Dysthymic disorder: Secondary | ICD-10-CM

## 2016-10-28 DIAGNOSIS — R102 Pelvic and perineal pain: Secondary | ICD-10-CM

## 2016-10-28 DIAGNOSIS — F329 Major depressive disorder, single episode, unspecified: Secondary | ICD-10-CM

## 2016-10-28 DIAGNOSIS — G4700 Insomnia, unspecified: Secondary | ICD-10-CM

## 2016-10-28 DIAGNOSIS — G8929 Other chronic pain: Secondary | ICD-10-CM

## 2016-10-28 LAB — POCT GLYCOSYLATED HEMOGLOBIN (HGB A1C): Hemoglobin A1C: 5.6

## 2016-10-28 MED ORDER — METAXALONE 800 MG PO TABS: 800.0000 mg | ORAL_TABLET | Freq: Two times a day (BID) | ORAL | 2 refills | Status: DC | PRN

## 2016-10-28 MED ORDER — VARENICLINE TARTRATE 0.5 MG X 11 & 1 MG X 42 PO MISC: ORAL | 0 refills | Status: DC

## 2016-10-28 MED ORDER — VARENICLINE TARTRATE 1 MG PO TABS: 1.0000 mg | ORAL_TABLET | Freq: Two times a day (BID) | ORAL | 1 refills | Status: DC

## 2016-10-28 NOTE — Patient Instructions (Addendum)
Varenicline oral tablets What is this medicine? VARENICLINE (var EN i kleen) is used to help people quit smoking. It can reduce the symptoms caused by stopping smoking. It is used with a patient support program recommended by your physician. This medicine may be used for other purposes; ask your health care provider or pharmacist if you have questions. COMMON BRAND NAME(S): Chantix What should I tell my health care provider before I take this medicine? They need to know if you have any of these conditions: -bipolar disorder, depression, schizophrenia or other mental illness -heart disease -if you often drink alcohol -kidney disease -peripheral vascular disease -seizures -stroke -suicidal thoughts, plans, or attempt; a previous suicide attempt by you or a family member -an unusual or allergic reaction to varenicline, other medicines, foods, dyes, or preservatives -pregnant or trying to get pregnant -breast-feeding How should I use this medicine? Take this medicine by mouth after eating. Take with a full glass of water. Follow the directions on the prescription label. Take your doses at regular intervals. Do not take your medicine more often than directed. There are 3 ways you can use this medicine to help you quit smoking; talk to your health care professional to decide which plan is right for you: 1) you can choose a quit date and start this medicine 1 week before the quit date, or, 2) you can start taking this medicine before you choose a quit date, and then pick a quit date between day 8 and 35 days of treatment, or, 3) if you are not sure that you are able or willing to quit smoking right away, start taking this medicine and slowly decrease the amount you smoke as directed by your health care professional with the goal of being cigarette-free by week 12 of treatment. Stick to your plan; ask about support groups or other ways to help you remain cigarette-free. If you are motivated to quit  smoking and did not succeed during a previous attempt with this medicine for reasons other than side effects, or if you returned to smoking after this treatment, speak with your health care professional about whether another course of this medicine may be right for you. A special MedGuide will be given to you by the pharmacist with each prescription and refill. Be sure to read this information carefully each time. Talk to your pediatrician regarding the use of this medicine in children. This medicine is not approved for use in children. Overdosage: If you think you have taken too much of this medicine contact a poison control center or emergency room at once. NOTE: This medicine is only for you. Do not share this medicine with others. What if I miss a dose? If you miss a dose, take it as soon as you can. If it is almost time for your next dose, take only that dose. Do not take double or extra doses. What may interact with this medicine? -alcohol or any product that contains alcohol -insulin -other stop smoking aids -theophylline -warfarin This list may not describe all possible interactions. Give your health care provider a list of all the medicines, herbs, non-prescription drugs, or dietary supplements you use. Also tell them if you smoke, drink alcohol, or use illegal drugs. Some items may interact with your medicine. What should I watch for while using this medicine? Visit your doctor or health care professional for regular check ups. Ask for ongoing advice and encouragement from your doctor or healthcare professional, friends, and family to help you quit. If   you smoke while on this medication, quit again Your mouth may get dry. Chewing sugarless gum or sucking hard candy, and drinking plenty of water may help. Contact your doctor if the problem does not go away or is severe. You may get drowsy or dizzy. Do not drive, use machinery, or do anything that needs mental alertness until you know how  this medicine affects you. Do not stand or sit up quickly, especially if you are an older patient. This reduces the risk of dizzy or fainting spells. Sleepwalking can happen during treatment with this medicine, and can sometimes lead to behavior that is harmful to you, other people, or property. Stop taking this medicine and tell your doctor if you start sleepwalking or have other unusual sleep-related activity. Decrease the amount of alcoholic beverages that you drink during treatment with this medicine until you know if this medicine affects your ability to tolerate alcohol. Some people have experienced increased drunkenness (intoxication), unusual or sometimes aggressive behavior, or no memory of things that have happened (amnesia) during treatment with this medicine. The use of this medicine may increase the chance of suicidal thoughts or actions. Pay special attention to how you are responding while on this medicine. Any worsening of mood, or thoughts of suicide or dying should be reported to your health care professional right away. What side effects may I notice from receiving this medicine? Side effects that you should report to your doctor or health care professional as soon as possible: -allergic reactions like skin rash, itching or hives, swelling of the face, lips, tongue, or throat -acting aggressive, being angry or violent, or acting on dangerous impulses -breathing problems -changes in vision -chest pain or chest tightness -confusion, trouble speaking or understanding -new or worsening depression, anxiety, or panic attacks -extreme increase in activity and talking (mania) -fast, irregular heartbeat -feeling faint or lightheaded, falls -fever -pain in legs when walking -problems with balance, talking, walking -redness, blistering, peeling or loosening of the skin, including inside the mouth -ringing in ears -seeing or hearing things that aren't there  (hallucinations) -seizures -sleepwalking -sudden numbness or weakness of the face, arm or leg -thoughts about suicide or dying, or attempts to commit suicide -trouble passing urine or change in the amount of urine -unusual bleeding or bruising -unusually weak or tired Side effects that usually do not require medical attention (report to your doctor or health care professional if they continue or are bothersome): -constipation -headache -nausea, vomiting -strange dreams -stomach gas -trouble sleeping This list may not describe all possible side effects. Call your doctor for medical advice about side effects. You may report side effects to FDA at 1-800-FDA-1088. Where should I keep my medicine? Keep out of the reach of children. Store at room temperature between 15 and 30 degrees C (59 and 86 degrees F). Throw away any unused medicine after the expiration date. NOTE: This sheet is a summary. It may not cover all possible information. If you have questions about this medicine, talk to your doctor, pharmacist, or health care provider.  2018 Elsevier/Gold Standard (2015-01-31 16:14:23)  Coping with Quitting Smoking Quitting smoking is a physical and mental challenge. You will face cravings, withdrawal symptoms, and temptation. Before quitting, work with your health care provider to make a plan that can help you cope. Preparation can help you quit and keep you from giving in. How can I cope with cravings? Cravings usually last for 5-10 minutes. If you get through it, the craving will pass. Consider taking  the following actions to help you cope with cravings:  Keep your mouth busy:  Chew sugar-free gum.  Suck on hard candies or a straw.  Brush your teeth.  Keep your hands and body busy:  Immediately change to a different activity when you feel a craving.  Squeeze or play with a ball.  Do an activity or a hobby, like making bead jewelry, practicing needlepoint, or working with  wood.  Mix up your normal routine.  Take a short exercise break. Go for a quick walk or run up and down stairs.  Spend time in public places where smoking is not allowed.  Focus on doing something kind or helpful for someone else.  Call a friend or family member to talk during a craving.  Join a support group.  Call a quit line, such as 1-800-QUIT-NOW.  Talk with your health care provider about medicines that might help you cope with cravings and make quitting easier for you. How can I deal with withdrawal symptoms? Your body may experience negative effects as it tries to get used to not having nicotine in the system. These effects are called withdrawal symptoms. They may include:  Feeling hungrier than normal.  Trouble concentrating.  Irritability.  Trouble sleeping.  Feeling depressed.  Restlessness and agitation.  Craving a cigarette. 1.  To manage withdrawal symptoms:  Avoid places, people, and activities that trigger your cravings.  Remember why you want to quit.  Get plenty of sleep.  Avoid coffee and other caffeinated drinks. These may worsen some of your symptoms. How can I handle social situations? Social situations can be difficult when you are quitting smoking, especially in the first few weeks. To manage this, you can:  Avoid parties, bars, and other social situations where people might be smoking.  Avoid alcohol.  Leave right away if you have the urge to smoke.  Explain to your family and friends that you are quitting smoking. Ask for understanding and support.  Plan activities with friends or family where smoking is not an option. What are some ways I can cope with stress? Wanting to smoke may cause stress, and stress can make you want to smoke. Find ways to manage your stress. Relaxation techniques can help. For example:  Breathe slowly and deeply, in through your nose and out through your mouth.  Listen to soothing, relaxing music.  Talk  with a family member or friend about your stress.  Light a candle.  Soak in a bath or take a shower.  Think about a peaceful place. What are some ways I can prevent weight gain? Be aware that many people gain weight after they quit smoking. However, not everyone does. To keep from gaining weight, have a plan in place before you quit and stick to the plan after you quit. Your plan should include:  Having healthy snacks. When you have a craving, it may help to:  Eat plain popcorn, crunchy carrots, celery, or other cut vegetables.  Chew sugar-free gum.  Changing how you eat:  Eat small portion sizes at meals.  Eat 4-6 small meals throughout the day instead of 1-2 large meals a day.  Be mindful when you eat. Do not watch television or do other things that might distract you as you eat.  Exercising regularly:  Make time to exercise each day. If you do not have time for a long workout, do short bouts of exercise for 5-10 minutes several times a day.  Do some form of strengthening  exercise, like weight lifting, and some form of aerobic exercise, like running or swimming.  Drinking plenty of water or other low-calorie or no-calorie drinks. Drink 6-8 glasses of water daily, or as much as instructed by your health care provider. Summary  Quitting smoking is a physical and mental challenge. You will face cravings, withdrawal symptoms, and temptation to smoke again. Preparation can help you as you go through these challenges.  You can cope with cravings by keeping your mouth busy (such as by chewing gum), keeping your body and hands busy, and making calls to family, friends, or a helpline for people who want to quit smoking.  You can cope with withdrawal symptoms by avoiding places where people smoke, avoiding drinks with caffeine, and getting plenty of rest.  Ask your health care provider about the different ways to prevent weight gain, avoid stress, and handle social situations. This  information is not intended to replace advice given to you by your health care provider. Make sure you discuss any questions you have with your health care provider. Document Released: 05/15/2016 Document Revised: 05/15/2016 Document Reviewed: 05/15/2016 Elsevier Interactive Patient Education  2017 [employer].

## 2016-10-28 NOTE — Addendum Note (Signed)
Addended by: Hubbard Hartshorn on: 10/28/2016 10:41 AM   Modules accepted: Orders
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
## 2016-11-02 ENCOUNTER — Telehealth: Payer: Self-pay | Admitting: Family Medicine

## 2016-11-02 DIAGNOSIS — E669 Obesity, unspecified: Secondary | ICD-10-CM

## 2016-11-02 DIAGNOSIS — E8881 Metabolic syndrome: Secondary | ICD-10-CM

## 2016-11-02 NOTE — Telephone Encounter (Signed)
Pt needs refill on Victoza and Metformin to be called into [facility].

## 2016-11-02 NOTE — Telephone Encounter (Signed)
Victoza is refilled. I don't see where she has been on Metformin. Her A1C at last check was exellent, and she does not need to be on Metformin. Please let her know this. Thank you!

## 2016-11-03 MED ORDER — LIRAGLUTIDE 18 MG/3ML ~~LOC~~ SOPN: 0.3000 mg | PEN_INJECTOR | SUBCUTANEOUS | 2 refills | Status: DC

## 2016-11-03 NOTE — Telephone Encounter (Signed)
Pt informed and understands °

## 2016-11-12 ENCOUNTER — Other Ambulatory Visit: Payer: Self-pay

## 2016-11-12 DIAGNOSIS — F329 Major depressive disorder, single episode, unspecified: Secondary | ICD-10-CM

## 2016-11-13 MED ORDER — DULOXETINE HCL 60 MG PO CPEP: 60.0000 mg | ORAL_CAPSULE | Freq: Every day | ORAL | 2 refills | Status: DC

## 2016-11-23 DIAGNOSIS — Z1231 Encounter for screening mammogram for malignant neoplasm of breast: Secondary | ICD-10-CM | POA: Diagnosis not present

## 2016-11-23 DIAGNOSIS — Z6834 Body mass index (BMI) 34.0-34.9, adult: Secondary | ICD-10-CM | POA: Diagnosis not present

## 2016-11-23 DIAGNOSIS — Z01419 Encounter for gynecological examination (general) (routine) without abnormal findings: Secondary | ICD-10-CM | POA: Diagnosis not present

## 2016-11-23 DIAGNOSIS — R232 Flushing: Secondary | ICD-10-CM | POA: Diagnosis not present

## 2016-11-28 LAB — HM MAMMOGRAPHY: HM MAMMO: NORMAL (ref 0–4)

## 2016-11-28 LAB — HM PAP SMEAR: HM PAP: NORMAL

## 2017-01-05 ENCOUNTER — Other Ambulatory Visit: Payer: Self-pay | Admitting: Family Medicine

## 2017-01-05 DIAGNOSIS — E8881 Metabolic syndrome: Secondary | ICD-10-CM

## 2017-01-05 DIAGNOSIS — E669 Obesity, unspecified: Secondary | ICD-10-CM

## 2017-01-05 MED ORDER — LIRAGLUTIDE 18 MG/3ML ~~LOC~~ SOPN: 1.8000 mg | PEN_INJECTOR | SUBCUTANEOUS | 2 refills | Status: DC

## 2017-01-22 ENCOUNTER — Emergency Department (HOSPITAL_COMMUNITY)
Admission: EM | Admit: 2017-01-22 | Discharge: 2017-01-22 | Disposition: A | Discharge status: Home / Self Care | Payer: Federal, State, Local not specified - PPO | Attending: Emergency Medicine | Admitting: Emergency Medicine

## 2017-01-22 DIAGNOSIS — Z5321 Procedure and treatment not carried out due to patient leaving prior to being seen by health care provider: Secondary | ICD-10-CM | POA: Diagnosis not present

## 2017-01-22 DIAGNOSIS — M549 Dorsalgia, unspecified: Secondary | ICD-10-CM | POA: Diagnosis not present

## 2017-01-22 NOTE — ED Triage Notes (Signed)
Reports upper back pain for the last month that goes down left arm at times.  Reports just in back today.  Has tried muscle relaxers, tramadol, tylenol, and rotating ice/heat with little to no relief.

## 2017-01-22 NOTE — ED Notes (Signed)
Attempted to call for patient multiple times with no response.  Will attempt again with next available room

## 2017-01-22 NOTE — ED Notes (Signed)
Called for patient again with no response.  

## 2017-01-28 ENCOUNTER — Ambulatory Visit: Payer: Federal, State, Local not specified - PPO | Admitting: Family Medicine

## 2017-01-29 ENCOUNTER — Ambulatory Visit (INDEPENDENT_AMBULATORY_CARE_PROVIDER_SITE_OTHER): Payer: Federal, State, Local not specified - PPO | Admitting: Family Medicine

## 2017-01-29 ENCOUNTER — Encounter: Payer: Self-pay | Admitting: Family Medicine

## 2017-01-29 VITALS — BP 104/62 | HR 112 | Temp 97.8°F | Resp 18 | Wt 197.8 lb

## 2017-01-29 DIAGNOSIS — F341 Dysthymic disorder: Secondary | ICD-10-CM | POA: Diagnosis not present

## 2017-01-29 DIAGNOSIS — Z13220 Encounter for screening for lipoid disorders: Secondary | ICD-10-CM

## 2017-01-29 DIAGNOSIS — E669 Obesity, unspecified: Secondary | ICD-10-CM

## 2017-01-29 DIAGNOSIS — T888 Other specified complications of surgical and medical care, not elsewhere classified: Secondary | ICD-10-CM | POA: Diagnosis not present

## 2017-01-29 DIAGNOSIS — M542 Cervicalgia: Secondary | ICD-10-CM | POA: Diagnosis not present

## 2017-01-29 DIAGNOSIS — G4700 Insomnia, unspecified: Secondary | ICD-10-CM

## 2017-01-29 DIAGNOSIS — Z23 Encounter for immunization: Secondary | ICD-10-CM

## 2017-01-29 DIAGNOSIS — F329 Major depressive disorder, single episode, unspecified: Secondary | ICD-10-CM

## 2017-01-29 DIAGNOSIS — F320 Major depressive disorder, single episode, mild: Secondary | ICD-10-CM | POA: Diagnosis not present

## 2017-01-29 DIAGNOSIS — R5383 Other fatigue: Secondary | ICD-10-CM

## 2017-01-29 DIAGNOSIS — M25519 Pain in unspecified shoulder: Secondary | ICD-10-CM

## 2017-01-29 DIAGNOSIS — E8881 Metabolic syndrome: Secondary | ICD-10-CM

## 2017-01-29 DIAGNOSIS — Z6832 Body mass index (BMI) 32.0-32.9, adult: Secondary | ICD-10-CM | POA: Diagnosis not present

## 2017-01-29 DIAGNOSIS — M2550 Pain in unspecified joint: Secondary | ICD-10-CM

## 2017-01-29 DIAGNOSIS — F17200 Nicotine dependence, unspecified, uncomplicated: Secondary | ICD-10-CM

## 2017-01-29 MED ORDER — PREDNISONE 10 MG (48) PO TBPK: ORAL_TABLET | ORAL | 0 refills | Status: DC

## 2017-01-29 MED ORDER — CYCLOBENZAPRINE HCL 10 MG PO TABS: 10.0000 mg | ORAL_TABLET | Freq: Every day | ORAL | 1 refills | Status: DC

## 2017-01-29 MED ORDER — HYDROCHLOROTHIAZIDE 25 MG PO TABS: 25.0000 mg | ORAL_TABLET | Freq: Every day | ORAL | 1 refills | Status: DC

## 2017-01-29 MED ORDER — DULOXETINE HCL 60 MG PO CPEP: 60.0000 mg | ORAL_CAPSULE | Freq: Every day | ORAL | 1 refills | Status: DC

## 2017-01-29 NOTE — Patient Instructions (Addendum)
[email or web address]  Myofascial Pain Syndrome and Fibromyalgia Myofascial pain syndrome and fibromyalgia are both pain disorders. This pain may be felt mainly in your muscles.  Myofascial pain syndrome: ? Always has trigger points or tender points in the muscle that will cause pain when pressed. The pain may come and go. ? Usually affects your neck, upper back, and shoulder areas. The pain often radiates into your arms and hands.  Fibromyalgia: ? Has muscle pains and tenderness that come and go. ? Is often associated with fatigue and sleep disturbances. ? Has trigger points. ? Tends to be long-lasting (chronic), but is not life-threatening.  Fibromyalgia and myofascial pain are not the same. However, they often occur together. If you have both conditions, each can make the other worse. Both are common and can cause enough pain and fatigue to make day-to-day activities difficult. What are the causes? The exact causes of fibromyalgia and myofascial pain are not known. People with certain gene types may be more likely to develop fibromyalgia. Some factors can be triggers for both conditions, such as:  Spine disorders.  Arthritis.  Severe injury (trauma) and other physical stressors.  Being under a lot of stress.  A medical illness.  What are the signs or symptoms? Fibromyalgia The main symptom of fibromyalgia is widespread pain and tenderness in your muscles. This can vary over time. Pain is sometimes described as stabbing, shooting, or burning. You may have tingling or numbness, too. You may also have sleep problems and fatigue. You may wake up feeling tired and groggy (fibro fog). Other symptoms may include:  Bowel and bladder problems.  Headaches.  Visual problems.  Problems with odors and noises.  Depression or mood changes.  Painful menstrual periods (dysmenorrhea).  Dry skin or eyes.  Myofascial pain syndrome Symptoms of myofascial pain syndrome  include:  Tight, ropy bands of muscle.  Uncomfortable sensations in muscular areas, such as: ? Aching. ? Cramping. ? Burning. ? Numbness. ? Tingling. ? Muscle weakness.  Trouble moving certain muscles freely (range of motion).  How is this diagnosed? There are no specific tests to diagnose fibromyalgia or myofascial pain syndrome. Both can be hard to diagnose because their symptoms are common in many other conditions. Your health care provider may suspect one or both of these conditions based on your symptoms and medical history. Your health care provider will also do a physical exam. The key to diagnosing fibromyalgia is having pain, fatigue, and other symptoms for more than three months that cannot be explained by another condition. The key to diagnosing myofascial pain syndrome is finding trigger points in muscles that are tender and cause pain elsewhere in your body (referred pain). How is this treated? Treating fibromyalgia and myofascial pain often requires a team of health care providers. This usually starts with your primary provider and a physical therapist. You may also find it helpful to work with alternative health care providers, such as massage therapists or acupuncturists. Treatment for fibromyalgia may include medicines. This may include nonsteroidal anti-inflammatory drugs (NSAIDs), along with other medicines. Treatment for myofascial pain may also include:  NSAIDs.  Cooling and stretching of muscles.  Trigger point injections.  Sound wave (ultrasound) treatments to stimulate muscles.  Follow these instructions at home:  Take medicines only as directed by your health care provider.  Exercise as directed by your health care provider or physical therapist.  Try to avoid stressful situations.  Practice relaxation techniques to control your stress. You may want to try: ? Biofeedback. ?  Visual imagery. ? Hypnosis. ? Muscle  relaxation. ? Yoga. ? Meditation.  Talk to your health care provider about alternative treatments, such as acupuncture or massage treatment.  Maintain a healthy lifestyle. This includes eating a healthy diet and getting enough sleep.  Consider joining a support group.  Do not do activities that stress or strain your muscles. That includes repetitive motions and heavy lifting. Where to find more information:  National Fibromyalgia Association: www.fmaware.[place]: www.arthritis.org  American Chronic Pain Association: [employer] Contact a health care provider if:  You have new symptoms.  Your symptoms get worse.  You have side effects from your medicines.  You have trouble sleeping.  Your condition is causing depression or anxiety. This information is not intended to replace advice given to you by your health care provider. Make sure you discuss any questions you have with your health care provider. Document Released: 05/18/2005 Document Revised: 10/24/2015 Document Reviewed: 02/21/2014 Elsevier Interactive Patient Education  [employer].
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
## 2017-02-02 ENCOUNTER — Telehealth: Payer: Self-pay | Admitting: Family Medicine

## 2017-02-02 NOTE — Telephone Encounter (Signed)
[facility]) has been faxing over a clarification form for prednisone but they have not received a response. Please give them a call [phone]. They are also needing clarification on hydrochlorothiazide.

## 2017-02-02 NOTE — Telephone Encounter (Signed)
Please clarify medication and dosages so that I can call the pharmacist back

## 2017-02-03 NOTE — Telephone Encounter (Signed)
Take one HCTZ daily The prednisone is a taper like Sterapred

## 2017-02-10 DIAGNOSIS — M5412 Radiculopathy, cervical region: Secondary | ICD-10-CM | POA: Diagnosis not present

## 2017-02-10 DIAGNOSIS — M25512 Pain in left shoulder: Secondary | ICD-10-CM | POA: Diagnosis not present

## 2017-03-08 ENCOUNTER — Ambulatory Visit (INDEPENDENT_AMBULATORY_CARE_PROVIDER_SITE_OTHER): Payer: Federal, State, Local not specified - PPO | Admitting: Family Medicine

## 2017-03-08 ENCOUNTER — Encounter: Payer: Self-pay | Admitting: Family Medicine

## 2017-03-08 VITALS — BP 100/62 | HR 115 | Temp 98.2°F | Resp 16 | Wt 217.3 lb

## 2017-03-08 DIAGNOSIS — G4726 Circadian rhythm sleep disorder, shift work type: Secondary | ICD-10-CM

## 2017-03-08 DIAGNOSIS — E8881 Metabolic syndrome: Secondary | ICD-10-CM | POA: Diagnosis not present

## 2017-03-08 DIAGNOSIS — F17200 Nicotine dependence, unspecified, uncomplicated: Secondary | ICD-10-CM | POA: Diagnosis not present

## 2017-03-08 DIAGNOSIS — M5412 Radiculopathy, cervical region: Secondary | ICD-10-CM | POA: Diagnosis not present

## 2017-03-08 DIAGNOSIS — R5383 Other fatigue: Secondary | ICD-10-CM | POA: Diagnosis not present

## 2017-03-08 DIAGNOSIS — M2550 Pain in unspecified joint: Secondary | ICD-10-CM | POA: Diagnosis not present

## 2017-03-08 MED ORDER — ARMODAFINIL 150 MG PO TABS: 150.0000 mg | ORAL_TABLET | Freq: Every day | ORAL | 2 refills | Status: DC

## 2017-03-08 NOTE — Patient Instructions (Signed)
Neck Exercises Neck exercises can be important for many reasons:  They can help you to improve and maintain flexibility in your neck. This can be especially important as you age.  They can help to make your neck stronger. This can make movement easier.  They can reduce or prevent neck pain.  They may help your upper back.  Ask your health care provider which neck exercises would be best for you. Exercises Neck Press Repeat this exercise 10 times. Do it first thing in the morning and right before bed or as told by your health care provider. 1. Lie on your back on a firm bed or on the floor with a pillow under your head. 2. Use your neck muscles to push your head down on the pillow and straighten your spine. 3. Hold the position as well as you can. Keep your head facing up and your chin tucked. 4. Slowly count to 5 while holding this position. 5. Relax for a few seconds. Then repeat.  Isometric Strengthening Do a full set of these exercises 2 times a day or as told by your health care provider. 1. Sit in a supportive chair and place your hand on your forehead. 2. Push forward with your head and neck while pushing back with your hand. Hold for 10 seconds. 3. Relax. Then repeat the exercise 3 times. 4. Next, do thesequence again, this time putting your hand against the back of your head. Use your head and neck to push backward against the hand pressure. 5. Finally, do the same exercise on either side of your head, pushing sideways against the pressure of your hand.  Prone Head Lifts Repeat this exercise 5 times. Do this 2 times a day or as told by your health care provider. 1. Lie face-down, resting on your elbows so that your chest and upper back are raised. 2. Start with your head facing downward, near your chest. Position your chin either on or near your chest. 3. Slowly lift your head upward. Lift until you are looking straight ahead. Then continue lifting your head as far back as  you can stretch. 4. Hold your head up for 5 seconds. Then slowly lower it to your starting position.  Supine Head Lifts Repeat this exercise 8-10 times. Do this 2 times a day or as told by your health care provider. 1. Lie on your back, bending your knees to point to the ceiling and keeping your feet flat on the floor. 2. Lift your head slowly off the floor, raising your chin toward your chest. 3. Hold for 5 seconds. 4. Relax and repeat.  Scapular Retraction Repeat this exercise 5 times. Do this 2 times a day or as told by your health care provider. 1. Stand with your arms at your sides. Look straight ahead. 2. Slowly pull both shoulders backward and downward until you feel a stretch between your shoulder blades in your upper back. 3. Hold for 10-30 seconds. 4. Relax and repeat.  Contact a health care provider if:  Your neck pain or discomfort gets much worse when you do an exercise.  Your neck pain or discomfort does not improve within 2 hours after you exercise. If you have any of these problems, stop exercising right away. Do not do the exercises again unless your health care provider says that you can. Get help right away if:  You develop sudden, severe neck pain. If this happens, stop exercising right away. Do not do the exercises again unless your   health care provider says that you can. Exercises Neck Stretch  Repeat this exercise 3-5 times. 1. Do this exercise while standing or while sitting in a chair. 2. Place your feet flat on the floor, shoulder-width apart. 3. Slowly turn your head to the right. Turn it all the way to the right so you can look over your right shoulder. Do not tilt or tip your head. 4. Hold this position for 10-30 seconds. 5. Slowly turn your head to the left, to look over your left shoulder. 6. Hold this position for 10-30 seconds.  Neck Retraction Repeat this exercise 8-10 times. Do this 3-4 times a day or as told by your health care  provider. 1. Do this exercise while standing or while sitting in a sturdy chair. 2. Look straight ahead. Do not bend your neck. 3. Use your fingers to push your chin backward. Do not bend your neck for this movement. Continue to face straight ahead. If you are doing the exercise properly, you will feel a slight sensation in your throat and a stretch at the back of your neck. 4. Hold the stretch for 1-2 seconds. Relax and repeat.  This information is not intended to replace advice given to you by your health care provider. Make sure you discuss any questions you have with your health care provider. Document Released: 04/29/2015 Document Revised: 10/24/2015 Document Reviewed: 11/26/2014 Elsevier Interactive Patient Education  2017 Elsevier Inc.  

## 2017-03-08 NOTE — Progress Notes (Signed)
Name: Stacey Stanley   MRN: [id number]    DOB: 1975/11/27   Date:03/08/2017       Progress Note  Subjective  Chief Complaint  Chief Complaint  Patient presents with  . Nerve Pain    Seen Orthopedics and was told she had Fibromyalgia as well and to follow up with Dr. Ancil Boozer     HPI  Cervical radiculitis: she took steroid taper, which made her very hungry, but did not improve symptoms. Seen by Dr. Alfonso Ramus ( at [facility]), and was given diagnosis of cervical radiculitis. She has neck pain, bilateral shoulder pain, numbness that can go down to elbows but occasional to both hands. Feels tired ( more than usual). He recommended MRI c-spine but unable to afford the test. She has not had lab work done at this time.   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 She works 3rd shift.    Patient Active Problem List   Diagnosis Date Noted  . Cervical radiculitis 03/08/2017  . Tobacco dependence 10/28/2016  . Vitamin B12 deficiency 08/19/2016  . Prediabetes 08/19/2016  . Excessive bleeding in premenopausal period 08/19/2015  . Right thyroid nodule 07/31/2015  . Chronic pelvic pain in female 04/05/2015  . GERD (gastroesophageal reflux disease) 04/05/2015  . Chronic major depressive disorder 11/25/2014  . Insomnia, persistent 11/25/2014  . Endometriosis 11/25/2014  . Essential (primary) hypertension 11/25/2014  . Dysmetabolic syndrome 57/84/6962  . Obesity (BMI 30-39.9) 11/25/2014    Past Surgical History:  Procedure Laterality Date  . DILATION AND CURETTAGE OF UTERUS    . EYE SURGERY  06/28/13   right laser eye surgery - repair retina  . HYSTEROSCOPY W/D&C N/A 07/19/2013   Procedure: DILATATION AND CURETTAGE /HYSTEROSCOPY;  Surgeon: Marvene Staff, MD;  Location: [facility];  Service: Gynecology;  Laterality: N/A;  . INDUCED ABORTION    . LAPAROSCOPY  1992   endometriosis  . LAPAROSCOPY N/A 07/19/2013   Procedure:  LAPAROSCOPY DIAGNOSTIC  with resection of endometriosis;  Surgeon: Marvene Staff, MD;  Location: [facility];  Service: Gynecology;  Laterality: N/A;  . metatarsil      left foot surgery     Family History  Problem Relation Age of Onset  . Diabetes Mother   . Hypertension Mother   . Hypertension Father   . Kidney disease Father   . Heart disease Brother   . Anesthesia problems Neg Hx   . Hypotension Neg Hx   . Malignant hyperthermia Neg Hx   . Pseudochol deficiency Neg Hx     Social History   Social History  . Marital status: Married    Spouse name: N/A  . Number of children: N/A  . Years of education: N/A   Occupational History  . Not on file.   Social History Main Topics  . Smoking status: Current Every Day Smoker    Packs/day: 0.25    Years: 21.00    Types: Cigarettes    Start date: 11/25/1993  . Smokeless tobacco: Never Used  . Alcohol use No  . Drug use: No  . Sexual activity: Yes    Partners: Male    Birth control/ protection: Injection   Other Topics Concern  . Not on file   Social History Narrative  . No narrative on file     Current Outpatient Prescriptions:  .  Cyanocobalamin (B-12) 1000 MCG SUBL, Place 1 mL under the tongue daily., Disp: 30  each, Rfl: 0 .  cyclobenzaprine (FLEXERIL) 10 MG tablet, Take 1 tablet (10 mg total) by mouth at bedtime., Disp: 90 tablet, Rfl: 1 .  DULoxetine (CYMBALTA) 60 MG capsule, Take 1 capsule (60 mg total) by mouth daily., Disp: 90 capsule, Rfl: 1 .  hydrochlorothiazide (HYDRODIURIL) 25 MG tablet, Take 1 tablet (25 mg total) by mouth daily. Take half a tablet daily, Disp: 90 tablet, Rfl: 1 .  liraglutide (VICTOZA) 18 MG/3ML SOPN, Inject 0.3 mLs (1.8 mg total) into the skin every morning., Disp: 3 pen, Rfl: 2 .  Multiple Vitamin (MULTI-VITAMINS) TABS, Take by mouth., Disp: , Rfl:   No Known Allergies   ROS  Ten systems reviewed and is negative except as mentioned in HPI  Objective  Vitals:   03/08/17 1222   BP: 100/62  Pulse: (!) 115  Resp: 16  Temp: 98.2 F (36.8 C)  TempSrc: Oral  SpO2: 98%  Weight: 217 lb 4.8 oz (98.6 kg)    Body mass index is 36.16 kg/m.  Physical Exam  Constitutional: Patient appears well-developed and well-nourished. Obese No distress.  HEENT: head atraumatic, normocephalic, pupils equal and reactive to light, throat within normal limits, neck pain with romal Cardiovascular: Normal rate, regular rhythm and normal heart sounds.  No murmur heard. No BLE edema. Pulmonary/Chest: Effort normal and breath sounds normal. No respiratory distress. Abdominal: Soft.  There is no tenderness. Psychiatric: Patient has a normal mood and affect. behavior is normal. Judgment and thought content normal. Muscular Skeletal: normal grip, normal sensation both hands,    PHQ2/9: Depression screen [facility] 2/9 01/29/2017 10/28/2016 05/13/2016 02/11/2016 01/10/2016  Decreased Interest 0 2 0 0 0  Down, Depressed, Hopeless 0 2 0 0 0  PHQ - 2 Score 0 4 0 0 0  Altered sleeping - 2 - - -  Tired, decreased energy - 3 - - -  Change in appetite - 0 - - -  Feeling bad or failure about yourself  - 0 - - -  Trouble concentrating - 2 - - -  Moving slowly or fidgety/restless - 0 - - -  Suicidal thoughts - 0 - - -  PHQ-9 Score - 11 - - -  Difficult doing work/chores - Somewhat difficult - - -     Fall Risk: Fall Risk  01/29/2017 10/28/2016 05/13/2016 02/11/2016 01/10/2016  Falls in the past year? No No No No No     Assessment & Plan  1. Cervical radiculitis  She cannot afford MRI, discussed home exercise since she cannot afford PT visits.   2. Shift work sleep disorder  - Armodafinil 150 MG tablet; Take 1 tablet (150 mg total) by mouth daily.  Dispense: 30 tablet; Refill: 2

## 2017-03-09 LAB — LIPID PANEL
CHOL/HDL RATIO: 2.5 (calc) (ref <5.0)
Cholesterol: 181 mg/dL (ref <200)
HDL: 73 mg/dL (ref >50)
LDL Cholesterol (Calc): 89 mg/dL (calc)
NON-HDL CHOLESTEROL (CALC): 108 mg/dL (ref <130)
TRIGLYCERIDES: 98 mg/dL (ref <150)

## 2017-03-09 LAB — CBC WITH DIFFERENTIAL/PLATELET
BASOS PCT: 0.6 %
Basophils Absolute: 47 cells/uL (ref 0–200)
EOS ABS: 119 {cells}/uL (ref 15–500)
Eosinophils Relative: 1.5 %
HCT: 41.6 % (ref 35.0–45.0)
Hemoglobin: 13.9 g/dL (ref 11.7–15.5)
LYMPHS ABS: 2583 {cells}/uL (ref 850–3900)
MCH: 29 pg (ref 27.0–33.0)
MCHC: 33.4 g/dL (ref 32.0–36.0)
MCV: 86.8 fL (ref 80.0–100.0)
MONOS PCT: 6.2 %
MPV: 8.9 fL (ref 7.5–12.5)
NEUTROS ABS: 4661 {cells}/uL (ref 1500–7800)
NEUTROS PCT: 59 %
PLATELETS: 405 10*3/uL — AB (ref 140–400)
RBC: 4.79 10*6/uL (ref 3.80–5.10)
RDW: 13 % (ref 11.0–15.0)
TOTAL LYMPHOCYTE: 32.7 %
WBC mixed population: 490 cells/uL (ref 200–950)
WBC: 7.9 10*3/uL (ref 3.8–10.8)

## 2017-03-09 LAB — COMPLETE METABOLIC PANEL WITH GFR
AG RATIO: 1.9 (calc) (ref 1.0–2.5)
ALT: 10 U/L (ref 6–29)
AST: 12 U/L (ref 10–30)
Albumin: 4.1 g/dL (ref 3.6–5.1)
Alkaline phosphatase (APISO): 58 U/L (ref 33–115)
BILIRUBIN TOTAL: 0.3 mg/dL (ref 0.2–1.2)
BUN: 11 mg/dL (ref 7–25)
CHLORIDE: 103 mmol/L (ref 98–110)
CO2: 30 mmol/L (ref 20–32)
Calcium: 9.6 mg/dL (ref 8.6–10.2)
Creat: 0.96 mg/dL (ref 0.50–1.10)
GFR, EST AFRICAN AMERICAN: 84 mL/min/{1.73_m2} (ref >=60)
GFR, EST NON AFRICAN AMERICAN: 72 mL/min/{1.73_m2} (ref >=60)
GLOBULIN: 2.2 g/dL (ref 1.9–3.7)
Glucose, Bld: 72 mg/dL (ref 65–139)
POTASSIUM: 3.8 mmol/L (ref 3.5–5.3)
SODIUM: 139 mmol/L (ref 135–146)
TOTAL PROTEIN: 6.3 g/dL (ref 6.1–8.1)

## 2017-03-09 LAB — HEMOGLOBIN A1C
EAG (MMOL/L): 5.8 (calc)
Hgb A1c MFr Bld: 5.3 % of total Hgb (ref <5.7)
Mean Plasma Glucose: 105 (calc)

## 2017-03-09 LAB — VITAMIN B12: Vitamin B-12: 396 pg/mL (ref 200–1100)

## 2017-03-09 LAB — URIC ACID: URIC ACID, SERUM: 4 mg/dL (ref 2.5–7.0)

## 2017-03-09 LAB — VITAMIN D 25 HYDROXY (VIT D DEFICIENCY, FRACTURES): VIT D 25 HYDROXY: 19 ng/mL — AB (ref 30–100)

## 2017-03-09 LAB — TSH: TSH: 0.28 m[IU]/L — AB

## 2017-03-09 LAB — SEDIMENTATION RATE: Sed Rate: 2 mm/h (ref 0–20)

## 2017-03-09 LAB — RHEUMATOID FACTOR: Rhuematoid fact SerPl-aCnc: <14 IU/mL (ref <14)

## 2017-03-09 LAB — INSULIN, RANDOM: Insulin: 14 u[IU]/mL (ref 2.0–19.6)

## 2017-03-09 LAB — C-REACTIVE PROTEIN: CRP: 1 mg/L (ref <8.0)

## 2017-04-16 ENCOUNTER — Other Ambulatory Visit: Payer: Self-pay | Admitting: Family Medicine

## 2017-04-16 DIAGNOSIS — E669 Obesity, unspecified: Secondary | ICD-10-CM

## 2017-04-16 DIAGNOSIS — E8881 Metabolic syndrome: Secondary | ICD-10-CM

## 2017-04-18 NOTE — Telephone Encounter (Signed)
She needs follow up, we can give her a sample of Victoza until she can be seen

## 2017-04-19 NOTE — Telephone Encounter (Signed)
Pt has an appt on Friday 04/23/17

## 2017-04-23 ENCOUNTER — Ambulatory Visit: Payer: Federal, State, Local not specified - PPO | Admitting: Family Medicine

## 2017-04-23 ENCOUNTER — Encounter: Payer: Self-pay | Admitting: Family Medicine

## 2017-04-23 VITALS — BP 118/72 | HR 96 | Temp 98.5°F | Resp 16 | Ht 65.0 in | Wt 218.4 lb

## 2017-04-23 DIAGNOSIS — E669 Obesity, unspecified: Secondary | ICD-10-CM

## 2017-04-23 DIAGNOSIS — G4700 Insomnia, unspecified: Secondary | ICD-10-CM | POA: Diagnosis not present

## 2017-04-23 DIAGNOSIS — E8881 Metabolic syndrome: Secondary | ICD-10-CM | POA: Diagnosis not present

## 2017-04-23 DIAGNOSIS — M5412 Radiculopathy, cervical region: Secondary | ICD-10-CM

## 2017-04-23 DIAGNOSIS — R7989 Other specified abnormal findings of blood chemistry: Secondary | ICD-10-CM | POA: Diagnosis not present

## 2017-04-23 DIAGNOSIS — F320 Major depressive disorder, single episode, mild: Secondary | ICD-10-CM | POA: Diagnosis not present

## 2017-04-23 DIAGNOSIS — D473 Essential (hemorrhagic) thrombocythemia: Secondary | ICD-10-CM

## 2017-04-23 DIAGNOSIS — G4726 Circadian rhythm sleep disorder, shift work type: Secondary | ICD-10-CM

## 2017-04-23 DIAGNOSIS — D75839 Thrombocytosis, unspecified: Secondary | ICD-10-CM

## 2017-04-23 DIAGNOSIS — E559 Vitamin D deficiency, unspecified: Secondary | ICD-10-CM

## 2017-04-23 MED ORDER — TIZANIDINE HCL 4 MG PO CAPS: 4.0000 mg | ORAL_CAPSULE | Freq: Every day | ORAL | 1 refills | Status: DC

## 2017-04-23 MED ORDER — VITAMIN D (ERGOCALCIFEROL) 1.25 MG (50000 UNIT) PO CAPS: 50000.0000 [IU] | ORAL_CAPSULE | ORAL | 0 refills | Status: DC

## 2017-04-23 MED ORDER — SEMAGLUTIDE(0.25 OR 0.5MG/DOS) 2 MG/1.5ML ~~LOC~~ SOPN: 0.5000 mg | PEN_INJECTOR | SUBCUTANEOUS | 2 refills | Status: DC

## 2017-04-23 MED ORDER — METFORMIN HCL ER 750 MG PO TB24: 750.0000 mg | ORAL_TABLET | Freq: Every day | ORAL | 1 refills | Status: DC
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
## 2017-04-29 DIAGNOSIS — R7989 Other specified abnormal findings of blood chemistry: Secondary | ICD-10-CM | POA: Diagnosis not present

## 2017-04-29 DIAGNOSIS — D473 Essential (hemorrhagic) thrombocythemia: Secondary | ICD-10-CM | POA: Diagnosis not present

## 2017-04-30 LAB — CBC WITH DIFFERENTIAL/PLATELET
BASOS ABS: 50 {cells}/uL (ref 0–200)
Basophils Relative: 0.8 %
EOS ABS: 167 {cells}/uL (ref 15–500)
Eosinophils Relative: 2.7 %
HCT: 41.9 % (ref 35.0–45.0)
Hemoglobin: 14 g/dL (ref 11.7–15.5)
Lymphs Abs: 2883 cells/uL (ref 850–3900)
MCH: 29 pg (ref 27.0–33.0)
MCHC: 33.4 g/dL (ref 32.0–36.0)
MCV: 86.7 fL (ref 80.0–100.0)
MPV: 9.4 fL (ref 7.5–12.5)
Monocytes Relative: 4.8 %
Neutro Abs: 2802 cells/uL (ref 1500–7800)
Neutrophils Relative %: 45.2 %
PLATELETS: 302 10*3/uL (ref 140–400)
RBC: 4.83 10*6/uL (ref 3.80–5.10)
RDW: 12.5 % (ref 11.0–15.0)
TOTAL LYMPHOCYTE: 46.5 %
WBC mixed population: 298 cells/uL (ref 200–950)
WBC: 6.2 10*3/uL (ref 3.8–10.8)

## 2017-04-30 LAB — THYROID PANEL WITH TSH
Free Thyroxine Index: 1.8 (ref 1.4–3.8)
T3 Uptake: 26 % (ref 22–35)
T4, Total: 6.9 ug/dL (ref 5.1–11.9)
TSH: 0.5 mIU/L

## 2017-05-13 ENCOUNTER — Encounter (HOSPITAL_COMMUNITY): Payer: Self-pay | Admitting: Family Medicine

## 2017-05-13 ENCOUNTER — Ambulatory Visit (HOSPITAL_COMMUNITY)
Admission: EM | Admit: 2017-05-13 | Discharge: 2017-05-13 | Disposition: A | Discharge status: Home / Self Care | Payer: Federal, State, Local not specified - PPO | Attending: Emergency Medicine | Admitting: Emergency Medicine

## 2017-05-13 ENCOUNTER — Ambulatory Visit (INDEPENDENT_AMBULATORY_CARE_PROVIDER_SITE_OTHER): Payer: Federal, State, Local not specified - PPO

## 2017-05-13 DIAGNOSIS — M549 Dorsalgia, unspecified: Secondary | ICD-10-CM

## 2017-05-13 MED ORDER — OMEPRAZOLE 20 MG PO CPDR: 20.0000 mg | DELAYED_RELEASE_CAPSULE | Freq: Every day | ORAL | 0 refills | Status: DC

## 2017-05-13 NOTE — Discharge Instructions (Signed)
Will try treatment for reflux and see if this is helpful with your symptoms.  Continue with ibuprofen as needed, take with food however. If develop increased shortness of breath, pain, chest pain, cough, dizziness, nausea, vomiting, arm pain or otherwise worsening please return to be seen or visit ed.  Continue to follow with your PCP for further evaluation and management of your symptoms.

## 2017-05-13 NOTE — ED Triage Notes (Signed)
Pt here for intermittent upper back pain pain around scapula area on the right  that has been intermittent for a "while". reports that she uses ibuprofen, muscle relaxer,  tens unit and the pain is worsening. She has been seen for this in the past by ortho and x rays done.

## 2017-05-13 NOTE — ED Provider Notes (Signed)
[place]    CSN: [id number] Arrival date & time: 05/13/17  1745     History   Chief Complaint Chief Complaint  Patient presents with  . Back Pain    HPI Stacey Stanley is a 41 y.o. female.   Stacey Stanley presents with complaints of left posterior shoulder/scapular pain which has been present for approximately 1 month. Was seen 10/8 for this per notes. States she had a similar presentation which resolved without treatment approximately 1 year ago. She has seen her PCP as well as orthopedics for this. MRI was recommended but patient unable to afford at this time. Pain is worsening. Has tried muscle relaxers, ibuprofen, tens unit, steroids and tylenol which have not helped. Pain is worse with standing. Does not worsen with neck or shoulder movements. Without chest pain, shortness of breath or cough. Without nausea or vomiting. Pain is constant, rates 6/10. Does not worsen with deep breathing. states feels like "something is in there." has a hard time sleeping due to pain. History of gerd but does not take any medications for this.    ROS per HPI.       Past Medical History:  Diagnosis Date  . Anxiety    no current meds  . Arthritis    left knee pain - otc med prn  . Chronic insomnia   . Depression   . Dysrhythmia    Home with daughter Stacey Stanley  . Endometriosis   . GERD (gastroesophageal reflux disease)    diet control - no meds  . Headache(784.0)    hx - last one 2 yrs ago - no meds  . Hypertension   . Metabolic syndrome   . Obesity   . Serum calcium elevated   . Sleep disorder, circadian, shift work type   . SVD (spontaneous vaginal delivery)    x 2  . Tachycardia    History - neg cardiac tests-stress related    Patient Active Problem List   Diagnosis Date Noted  . Cervical radiculitis 03/08/2017  . Tobacco dependence 10/28/2016  . Vitamin B12 deficiency 08/19/2016  . Prediabetes 08/19/2016  . Excessive bleeding in premenopausal period  08/19/2015  . Right thyroid nodule 07/31/2015  . Chronic pelvic pain in female 04/05/2015  . GERD (gastroesophageal reflux disease) 04/05/2015  . Chronic major depressive disorder 11/25/2014  . Insomnia, persistent 11/25/2014  . Endometriosis 11/25/2014  . Essential (primary) hypertension 11/25/2014  . Dysmetabolic syndrome 06/03/7251  . Obesity (BMI 30-39.9) 11/25/2014    Past Surgical History:  Procedure Laterality Date  . DILATION AND CURETTAGE OF UTERUS    . EYE SURGERY  06/28/13   right laser eye surgery - repair retina  . HYSTEROSCOPY W/D&C N/A 07/19/2013   Procedure: DILATATION AND CURETTAGE /HYSTEROSCOPY;  Surgeon: Marvene Staff, MD;  Location: [facility];  Service: Gynecology;  Laterality: N/A;  . INDUCED ABORTION    . LAPAROSCOPY  1992   endometriosis  . LAPAROSCOPY N/A 07/19/2013   Procedure: LAPAROSCOPY DIAGNOSTIC  with resection of endometriosis;  Surgeon: Marvene Staff, MD;  Location: [facility];  Service: Gynecology;  Laterality: N/A;  . metatarsil      left foot surgery     OB History    Gravida Para Term Preterm AB Living   6 2 2  0 4 2   SAB TAB Ectopic Multiple Live Births   2 2 0 0         Home Medications    Prior to Admission medications  Medication Sig Start Date End Date Taking? Authorizing Provider  Armodafinil 150 MG tablet Take 1 tablet (150 mg total) by mouth daily. 03/08/17   Steele Sizer, MD  Cyanocobalamin (B-12) 1000 MCG SUBL Place 1 mL under the tongue daily. 01/10/16   Steele Sizer, MD  DULoxetine (CYMBALTA) 60 MG capsule Take 1 capsule (60 mg total) by mouth daily. 01/29/17   Steele Sizer, MD  hydrochlorothiazide (HYDRODIURIL) 25 MG tablet Take 1 tablet (25 mg total) by mouth daily. Take half a tablet daily 01/29/17   Steele Sizer, MD  metFORMIN (GLUCOPHAGE XR) 750 MG 24 hr tablet Take 1 tablet (750 mg total) by mouth daily with breakfast. 04/23/17   Steele Sizer, MD  Multiple Vitamin (MULTI-VITAMINS) TABS Take by mouth.     [provider]  omeprazole (PRILOSEC) 20 MG capsule Take 1 capsule (20 mg total) by mouth daily. 05/13/17   Zigmund Gottron, NP  Semaglutide (OZEMPIC) 0.25 or 0.5 MG/DOSE SOPN Inject 0.5 mg into the skin once a week. 04/23/17   Steele Sizer, MD  tiZANidine (ZANAFLEX) 4 MG capsule Take 1 capsule (4 mg total) by mouth at bedtime. 04/23/17   Steele Sizer, MD  Vitamin D, Ergocalciferol, (DRISDOL) 50000 units CAPS capsule Take 1 capsule (50,000 Units total) by mouth every 7 (seven) days. 04/23/17   Steele Sizer, MD    Family History Family History  Problem Relation Age of Onset  . Diabetes Mother   . Hypertension Mother   . Hypertension Father   . Kidney disease Father   . Heart disease Brother   . Anesthesia problems Neg Hx   . Hypotension Neg Hx   . Malignant hyperthermia Neg Hx   . Pseudochol deficiency Neg Hx     Social History Social History   Tobacco Use  . Smoking status: Current Every Day Smoker    Packs/day: 0.25    Years: 21.00    Pack years: 5.25    Types: Cigarettes    Start date: 11/25/1993  . Smokeless tobacco: Never Used  Substance Use Topics  . Alcohol use: No    Alcohol/week: 0.0 oz  . Drug use: No     Allergies   Patient has no known allergies.   Review of Systems Review of Systems   Physical Exam Triage Vital Signs ED Triage Vitals  Enc Vitals Group     BP 05/13/17 1816 (!) 133/91     Pulse Rate 05/13/17 1816 (!) 101     Resp 05/13/17 1816 18     Temp 05/13/17 1816 98.2 F (36.8 C)     Temp src --      SpO2 05/13/17 1816 98 %     Weight --      Height --      Head Circumference --      Peak Flow --      Pain Score 05/13/17 1814 6     Pain Loc --      Pain Edu? --      Excl. in [place]? --    No data found.  Updated Vital Signs BP (!) 133/91   Pulse (!) 101   Temp 98.2 F (36.8 C)   Resp 18   LMP 07/09/2015 Comment: patient stated that she has been bleeding for about 22 days  SpO2 98%   Visual Acuity Right Eye  Distance:   Left Eye Distance:   Bilateral Distance:    Right Eye Near:   Left Eye Near:  Bilateral Near:     Physical Exam  Constitutional: She is oriented to person, place, and time. She appears well-developed and well-nourished. No distress.  Cardiovascular: Normal rate, regular rhythm and normal heart sounds.  Pulmonary/Chest: Effort normal and breath sounds normal. No tachypnea. No respiratory distress. She has no wheezes.      Minimal reproduction of pain with palpation  Musculoskeletal:       Right shoulder: Normal.       Cervical back: Normal.  Neurological: She is alert and oriented to person, place, and time.  Skin: Skin is warm and dry.     UC Treatments / Results  Labs (all labs ordered are listed, but only abnormal results are displayed) Labs Reviewed - No data to display  EKG  EKG Interpretation None       Radiology Dg Chest 2 View  Result Date: 05/13/2017 CLINICAL DATA:  Intermittent upper back pain. EXAM: CHEST  2 VIEW COMPARISON:  11/20/2010 FINDINGS: The lungs are clear without focal pneumonia, edema, pneumothorax or pleural effusion. The cardiopericardial silhouette is within normal limits for size. The visualized bony structures of the thorax are intact. IMPRESSION: Stable.  No acute findings. Electronically Signed   By: Misty Stanley M.D.   On: 05/13/2017 19:27    Procedures Procedures (including critical care time)  Medications Ordered in UC Medications - No data to display   Initial Impression / Assessment and Plan / UC Course  I have reviewed the triage vital signs and the nursing notes.  Pertinent labs & imaging results that were available during my care of the patient were reviewed by me and considered in my medical decision making (see chart for details).  Clinical Course as of May 13 1930  Thu May 13, 2017  1852 HR 90,O2 100%  [NB]    Clinical Course User Index [NB] Zigmund Gottron, NP    Differentials considered: muscular  injury, PE, ACS, gerd, cervical radiculopathy. Without tachycardia during exam, without hypoxia, cough or shortness of breath. Has had similar symptoms in the past which have resolved. Symptoms have not improved with muscle relaxers or nsaids, minimal reproduction with movement or palpation; has been chronic in nature without additional symptoms of nausea, diaphoresis, arm pain or chest pain, less likely to be acs or pe at this time. Will try treatment for GERD at this time with continued follow up with PCP and with orthopedics as needed. Return precautions provided. Patient verbalized understanding and agreeable to plan.    Final Clinical Impressions(s) / UC Diagnoses   Final diagnoses:  Upper back pain    ED Discharge Orders        Ordered    omeprazole (PRILOSEC) 20 MG capsule  Daily     05/13/17 1926       Controlled Substance Prescriptions [place] Controlled Substance Registry consulted? Not Applicable   Zigmund Gottron, NP 05/13/17 Kathyrn Drown    Zigmund Gottron, NP 05/13/17 1931

## 2017-06-01 HISTORY — PX: COLONOSCOPY: SHX174

## 2017-06-03 ENCOUNTER — Telehealth: Payer: Self-pay | Admitting: Family Medicine

## 2017-06-03 NOTE — Telephone Encounter (Signed)
Copied from [place] [id number]. Topic: Quick Communication - See Telephone Encounter >> Jun 03, 2017  2:40 PM Ivar Drape wrote: CRM for notification. See Telephone encounter for:  06/03/17. Patient has been on Ozempic and she has been very constipated.  She wants to know if there is something she can take or do to remedy the problem.  She has done suppositories, enemas and laxatives and nothing is working.

## 2017-06-04 ENCOUNTER — Other Ambulatory Visit: Payer: Self-pay | Admitting: Family Medicine

## 2017-06-04 NOTE — Telephone Encounter (Signed)
I recommend her to hold dose and see if bowel movements improve, she can also try Miralax otc , however if she is not eating as much therefore not enough bowel movements

## 2017-06-04 NOTE — Telephone Encounter (Signed)
Patient will hold next dose and let us know how it goes.

## 2017-06-15 ENCOUNTER — Encounter: Payer: Self-pay | Admitting: Family Medicine

## 2017-06-15 ENCOUNTER — Ambulatory Visit: Payer: Federal, State, Local not specified - PPO | Admitting: Family Medicine

## 2017-06-15 ENCOUNTER — Encounter: Payer: Self-pay | Admitting: Emergency Medicine

## 2017-06-15 VITALS — BP 110/70 | HR 96 | Temp 98.4°F | Resp 16 | Ht 65.0 in | Wt 201.7 lb

## 2017-06-15 DIAGNOSIS — E8881 Metabolic syndrome: Secondary | ICD-10-CM

## 2017-06-15 DIAGNOSIS — E041 Nontoxic single thyroid nodule: Secondary | ICD-10-CM

## 2017-06-15 DIAGNOSIS — K5900 Constipation, unspecified: Secondary | ICD-10-CM

## 2017-06-15 DIAGNOSIS — R7303 Prediabetes: Secondary | ICD-10-CM

## 2017-06-15 DIAGNOSIS — E669 Obesity, unspecified: Secondary | ICD-10-CM | POA: Diagnosis not present

## 2017-06-15 NOTE — Patient Instructions (Addendum)

## 2017-06-15 NOTE — Progress Notes (Signed)
Name: Stacey Stanley   MRN: [id number]    DOB: 11/23/1975   Date:06/15/2017       Progress Note  Subjective  Chief Complaint  Chief Complaint  Patient presents with  . Constipation  . paperwork    FMLA     HPI  She presents with concern for constipation.  She did have BM this morning - it was normal.  She drinks a large amount of water daily; she eats vegetables, fruit, other high fiber foods daily.  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 She requests paperwork today to state that she needs to be able to access restroom as needed.  - Endorses abdominal pain that is relieved with BM.  Often keeps her up at night.  - Denies blood in stool, black and tarry stools, mucus in stools, vomiting, nausea. - Went 17 days in November without a BM - was passing gas during those times; she states 12-14 days is not uncommon.  She has tried stool softeners and osmotic laxatives in the past; has been taking stimulant laxatives.  She is currently using bisacodyl and exlax maximum strength - still having constipation.  Takes miralax but not taking daily. - Has seen a GI specialist in the past and was given Linzess which did not help.   - Has been taking Ozempic, Metformin for prediabetes/dysmetabolic syndrome and obesity - she feels that the [place] may have worsened her constipation, but does not want to stop at this time.  She did skip one dose about 2 weeks ago and it did not make a difference. - She has history thyroid nodules - we will check TSH today, though November 2018 levels were normal.  Patient Active Problem List   Diagnosis Date Noted  . Cervical radiculitis 03/08/2017  . Tobacco dependence 10/28/2016  . Vitamin B12 deficiency 08/19/2016  . Prediabetes 08/19/2016  . Excessive bleeding in premenopausal period 08/19/2015  . Right thyroid nodule 07/31/2015  .  Chronic pelvic pain in female 04/05/2015  . GERD (gastroesophageal reflux disease) 04/05/2015  . Chronic major depressive disorder 11/25/2014  . Insomnia, persistent 11/25/2014  . Endometriosis 11/25/2014  . Essential (primary) hypertension 11/25/2014  . Dysmetabolic syndrome 26/71/2458  . Obesity (BMI 30-39.9) 11/25/2014    Social History   Tobacco Use  . Smoking status: Current Every Day Smoker    Packs/day: 0.25    Years: 21.00    Pack years: 5.25    Types: Cigarettes    Start date: 11/25/1993  . Smokeless tobacco: Never Used  Substance Use Topics  . Alcohol use: No    Alcohol/week: 0.0 oz     Current Outpatient Medications:  .  Armodafinil 150 MG tablet, Take 1 tablet (150 mg total) by mouth daily., Disp: 30 tablet, Rfl: 2 .  Cyanocobalamin (B-12) 1000 MCG SUBL, Place 1 mL under the tongue daily., Disp: 30 each, Rfl: 0 .  DULoxetine (CYMBALTA) 60 MG capsule, Take 1 capsule (60 mg total) by mouth daily., Disp: 90 capsule, Rfl: 1 .  hydrochlorothiazide (HYDRODIURIL) 25 MG tablet, Take 1 tablet (25 mg total) by mouth daily. Take half a tablet daily, Disp: 90 tablet, Rfl: 1 .  metFORMIN (GLUCOPHAGE XR) 750 MG 24 hr tablet, Take 1 tablet (750 mg total) by mouth daily with breakfast., Disp: 90 tablet, Rfl: 1 .  Multiple Vitamin (MULTI-VITAMINS) TABS, Take by mouth.,  Disp: , Rfl:  .  omeprazole (PRILOSEC) 20 MG capsule, Take 1 capsule (20 mg total) by mouth daily., Disp: 30 capsule, Rfl: 0 .  Semaglutide (OZEMPIC) 0.25 or 0.5 MG/DOSE SOPN, Inject 0.5 mg into the skin once a week., Disp: 2 pen, Rfl: 2 .  tiZANidine (ZANAFLEX) 4 MG capsule, Take 1 capsule (4 mg total) by mouth at bedtime., Disp: 90 capsule, Rfl: 1 .  Vitamin D, Ergocalciferol, (DRISDOL) 50000 units CAPS capsule, Take 1 capsule (50,000 Units total) by mouth every 7 (seven) days., Disp: 12 capsule, Rfl: 0  No Known Allergies  ROS  Constitutional: Negative for fever or weight change.  Respiratory: Negative for cough  and shortness of breath.   Cardiovascular: Negative for chest pain or palpitations.  Gastrointestinal: Negative for abdominal pain, no bowel changes.  Musculoskeletal: Negative for gait problem or joint swelling.  Skin: Negative for rash.  Neurological: Negative for dizziness or headache.  No other specific complaints in a complete review of systems (except as listed in HPI above).  Objective  Vitals:   06/15/17 0916  BP: 110/70  Pulse: 96  Resp: 16  Temp: 98.4 F (36.9 C)  TempSrc: Oral  SpO2: 99%  Weight: 201 lb 11.2 oz (91.5 kg)  Height: 5\' 5"  (1.651 m)   Body mass index is 33.56 kg/m.  Nursing Note and Vital Signs reviewed.  Physical Exam  Constitutional: Patient appears well-developed and well-nourished. Obese. No distress.  HEENT: head atraumatic, normocephalic Cardiovascular: Normal rate, regular rhythm, S1/S2 present.  No murmur or rub heard. No BLE edema. Pulmonary/Chest: Effort normal and breath sounds clear. No respiratory distress or retractions. Abdominal: Soft and generalized tenderness without distension, guarding, mass, or HSM. bowel sounds present x4 quadrants and normo-active.  Psychiatric: Patient has a normal mood and affect. behavior is normal. Judgment and thought content normal.  No results found for this or any previous visit (from the past 72 hour(s)).  Assessment & Plan  1. Constipation, unspecified constipation type - Ambulatory referral to Gastroenterology - advised that due to chronicity of issue, multiple medication management attempts in the past, and previously seeing GI, we will refer back for further evaluation. - CBC - COMPLETE METABOLIC PANEL WITH GFR - TSH - Continue to get plenty of water and fiber in diet; no medication changes today as patient had normal BM this morning. - Note is provided stating patient must have bathroom privileges while at work per her request.  2. Obesity (BMI 30-39.9) - CBC - TSH - COMPLETE METABOLIC  PANEL WITH GFR  3. Dysmetabolic syndrome - COMPLETE METABOLIC PANEL WITH GFR - TSH  4. Prediabetes - COMPLETE METABOLIC PANEL WITH GFR  5. Right thyroid nodule - TSH  - Patient also brings FMLA paperwork to request intermittent FMLA for her depression. PCP Dr. Ancil Boozer is notified of patient's request, and the paperwork is given to [id number] [place].  Advised patient that she must attend February appointment with Dr. Ancil Boozer to discuss this in detail.  -Red flags and when to present for emergency care or RTC including fever >101.51F, chest pain, shortness of breath, cessation of flatus for >12 hours, nausea/vomiting, blood in stool, new/worsening/un-resolving symptoms, reviewed with patient at time of visit. Follow up and care instructions discussed and provided in AVS.

## 2017-06-16 LAB — COMPLETE METABOLIC PANEL WITH GFR
AG RATIO: 1.9 (calc) (ref 1.0–2.5)
ALKALINE PHOSPHATASE (APISO): 49 U/L (ref 33–115)
ALT: 14 U/L (ref 6–29)
AST: 14 U/L (ref 10–30)
Albumin: 4.2 g/dL (ref 3.6–5.1)
BUN: 11 mg/dL (ref 7–25)
CALCIUM: 9.9 mg/dL (ref 8.6–10.2)
CO2: 28 mmol/L (ref 20–32)
CREATININE: 0.9 mg/dL (ref 0.50–1.10)
Chloride: 106 mmol/L (ref 98–110)
GFR, EST NON AFRICAN AMERICAN: 78 mL/min/{1.73_m2} (ref >=60)
GFR, Est African American: 91 mL/min/{1.73_m2} (ref >=60)
GLOBULIN: 2.2 g/dL (ref 1.9–3.7)
Glucose, Bld: 82 mg/dL (ref 65–99)
POTASSIUM: 3.8 mmol/L (ref 3.5–5.3)
SODIUM: 140 mmol/L (ref 135–146)
Total Bilirubin: 0.4 mg/dL (ref 0.2–1.2)
Total Protein: 6.4 g/dL (ref 6.1–8.1)

## 2017-06-16 LAB — CBC
HCT: 42.2 % (ref 35.0–45.0)
Hemoglobin: 14.5 g/dL (ref 11.7–15.5)
MCH: 29.3 pg (ref 27.0–33.0)
MCHC: 34.4 g/dL (ref 32.0–36.0)
MCV: 85.3 fL (ref 80.0–100.0)
MPV: 10 fL (ref 7.5–12.5)
PLATELETS: 338 10*3/uL (ref 140–400)
RBC: 4.95 10*6/uL (ref 3.80–5.10)
RDW: 12.6 % (ref 11.0–15.0)
WBC: 7.2 10*3/uL (ref 3.8–10.8)

## 2017-06-16 LAB — TSH: TSH: 1.05 m[IU]/L

## 2017-06-17 DIAGNOSIS — K5904 Chronic idiopathic constipation: Secondary | ICD-10-CM | POA: Diagnosis not present

## 2017-06-17 DIAGNOSIS — R140 Abdominal distension (gaseous): Secondary | ICD-10-CM | POA: Diagnosis not present

## 2017-06-17 DIAGNOSIS — E669 Obesity, unspecified: Secondary | ICD-10-CM | POA: Diagnosis not present

## 2017-06-24 ENCOUNTER — Other Ambulatory Visit: Payer: Self-pay

## 2017-06-24 ENCOUNTER — Encounter (HOSPITAL_COMMUNITY): Payer: Self-pay | Admitting: *Deleted

## 2017-06-24 ENCOUNTER — Emergency Department (HOSPITAL_COMMUNITY)
Admission: EM | Admit: 2017-06-24 | Discharge: 2017-06-24 | Discharge status: Against Medical Advice | Payer: Federal, State, Local not specified - PPO | Attending: Emergency Medicine | Admitting: Emergency Medicine

## 2017-06-24 DIAGNOSIS — R110 Nausea: Secondary | ICD-10-CM | POA: Diagnosis not present

## 2017-06-24 DIAGNOSIS — M791 Myalgia, unspecified site: Secondary | ICD-10-CM | POA: Insufficient documentation

## 2017-06-24 DIAGNOSIS — I10 Essential (primary) hypertension: Secondary | ICD-10-CM | POA: Insufficient documentation

## 2017-06-24 DIAGNOSIS — E119 Type 2 diabetes mellitus without complications: Secondary | ICD-10-CM | POA: Insufficient documentation

## 2017-06-24 DIAGNOSIS — Z7984 Long term (current) use of oral hypoglycemic drugs: Secondary | ICD-10-CM | POA: Diagnosis not present

## 2017-06-24 DIAGNOSIS — R1033 Periumbilical pain: Secondary | ICD-10-CM | POA: Diagnosis not present

## 2017-06-24 DIAGNOSIS — K5904 Chronic idiopathic constipation: Secondary | ICD-10-CM | POA: Diagnosis not present

## 2017-06-24 DIAGNOSIS — R109 Unspecified abdominal pain: Secondary | ICD-10-CM

## 2017-06-24 DIAGNOSIS — R1032 Left lower quadrant pain: Secondary | ICD-10-CM | POA: Diagnosis not present

## 2017-06-24 DIAGNOSIS — Z5329 Procedure and treatment not carried out because of patient's decision for other reasons: Secondary | ICD-10-CM | POA: Diagnosis not present

## 2017-06-24 DIAGNOSIS — F17210 Nicotine dependence, cigarettes, uncomplicated: Secondary | ICD-10-CM | POA: Diagnosis not present

## 2017-06-24 DIAGNOSIS — R140 Abdominal distension (gaseous): Secondary | ICD-10-CM | POA: Diagnosis not present

## 2017-06-24 DIAGNOSIS — Z79899 Other long term (current) drug therapy: Secondary | ICD-10-CM | POA: Diagnosis not present

## 2017-06-24 LAB — URINALYSIS, ROUTINE W REFLEX MICROSCOPIC
Glucose, UA: NEGATIVE mg/dL
Hgb urine dipstick: NEGATIVE
Ketones, ur: NEGATIVE mg/dL
Leukocytes, UA: NEGATIVE
Nitrite: NEGATIVE
PH: 6 (ref 5.0–8.0)
Protein, ur: 30 mg/dL — AB
Specific Gravity, Urine: 1.031 — ABNORMAL HIGH (ref 1.005–1.030)

## 2017-06-24 LAB — BASIC METABOLIC PANEL
ANION GAP: 9 (ref 5–15)
BUN: 9 mg/dL (ref 6–20)
CHLORIDE: 104 mmol/L (ref 101–111)
CO2: 24 mmol/L (ref 22–32)
Calcium: 9.5 mg/dL (ref 8.9–10.3)
Creatinine, Ser: 0.82 mg/dL (ref 0.44–1.00)
GFR calc Af Amer: >60 mL/min (ref >60)
GLUCOSE: 94 mg/dL (ref 65–99)
POTASSIUM: 3.5 mmol/L (ref 3.5–5.1)
Sodium: 137 mmol/L (ref 135–145)

## 2017-06-24 LAB — CBC
HCT: 42.9 % (ref 36.0–46.0)
HEMOGLOBIN: 14.5 g/dL (ref 12.0–15.0)
MCH: 29.5 pg (ref 26.0–34.0)
MCHC: 33.8 g/dL (ref 30.0–36.0)
MCV: 87.2 fL (ref 78.0–100.0)
PLATELETS: 313 10*3/uL (ref 150–400)
RBC: 4.92 MIL/uL (ref 3.87–5.11)
RDW: 13 % (ref 11.5–15.5)
WBC: 6.9 10*3/uL (ref 4.0–10.5)

## 2017-06-24 LAB — HEPATIC FUNCTION PANEL
ALK PHOS: 52 U/L (ref 38–126)
ALT: 15 U/L (ref 14–54)
AST: 19 U/L (ref 15–41)
Albumin: 3.8 g/dL (ref 3.5–5.0)
Bilirubin, Direct: <0.1 mg/dL — ABNORMAL LOW (ref 0.1–0.5)
Total Bilirubin: 0.5 mg/dL (ref 0.3–1.2)
Total Protein: 6.4 g/dL — ABNORMAL LOW (ref 6.5–8.1)

## 2017-06-24 LAB — LIPASE, BLOOD: Lipase: 29 U/L (ref 11–51)

## 2017-06-24 MED ORDER — KETOROLAC TROMETHAMINE 30 MG/ML IJ SOLN
30.0000 mg | Freq: Once | INTRAMUSCULAR | Status: AC
  Administered 2017-06-24: 30 mg via INTRAVENOUS
  Filled 2017-06-24: qty 1

## 2017-06-24 MED ORDER — SODIUM CHLORIDE 0.9 % IV BOLUS (SEPSIS)
1000.0000 mL | Freq: Once | INTRAVENOUS | Status: AC
  Administered 2017-06-24: 1000 mL via INTRAVENOUS

## 2017-06-24 NOTE — ED Provider Notes (Signed)
[facility] EMERGENCY DEPARTMENT Provider Note   CSN: [id number] Arrival date & time: 06/24/17  0957     History   Chief Complaint Chief Complaint  Patient presents with  . Flank Pain    HPI Stacey Stanley is a 42 y.o. female with history of anxiety, arthritis, chronic insomnia, DM, endometriosis status post hysterectomy, GERD, HTN, and chronic constipation presents today with chief complaint acute onset, constant left flank pain which began last night at around 7 PM.  She states pain is sharp in nature, worsens with bending.  Pain does not radiate.  Has tried drinking water without significant relief.  She thinks this may be a kidney infection or kidney stone.  She denies dysuria, hematuria, urgency, or frequency. Nausea but no vomiting. Endorses chronic constipation states her last bowel movement was 4 days ago.  She is currently being evaluated by her primary care physician for her constipation.  Denies chest pain, shortness of breath, fevers, chills, vaginal pain, vaginal bleeding, or vaginal discharge.  She states she does do a lot of heavy lifting and twisting at work but denies any known incident of trauma that may have caused her pain.  The history is provided by the patient.    Past Medical History:  Diagnosis Date  . Anxiety    no current meds  . Arthritis    left knee pain - otc med prn  . Chronic insomnia   . Depression   . Diabetes mellitus without complication ([place])   . Dysrhythmia    Home with daughter [name]  . Endometriosis   . GERD (gastroesophageal reflux disease)    diet control - no meds  . Headache(784.0)    hx - last one 2 yrs ago - no meds  . Hypertension   . Metabolic syndrome   . Obesity   . Serum calcium elevated   . Sleep disorder, circadian, shift work type   . SVD (spontaneous vaginal delivery)    x 2  . Tachycardia    History - neg cardiac tests-stress related    Patient Active Problem List   Diagnosis Date  Noted  . Cervical radiculitis 03/08/2017  . Tobacco dependence 10/28/2016  . Vitamin B12 deficiency 08/19/2016  . Prediabetes 08/19/2016  . Excessive bleeding in premenopausal period 08/19/2015  . Right thyroid nodule 07/31/2015  . Chronic pelvic pain in female 04/05/2015  . GERD (gastroesophageal reflux disease) 04/05/2015  . Chronic major depressive disorder 11/25/2014  . Insomnia, persistent 11/25/2014  . Endometriosis 11/25/2014  . Essential (primary) hypertension 11/25/2014  . Dysmetabolic syndrome 73/53/2992  . Obesity (BMI 30-39.9) 11/25/2014    Past Surgical History:  Procedure Laterality Date  . ABDOMINAL HYSTERECTOMY    . DILATION AND CURETTAGE OF UTERUS    . EYE SURGERY  06/28/13   right laser eye surgery - repair retina  . HYSTEROSCOPY W/D&C N/A 07/19/2013   Procedure: DILATATION AND CURETTAGE /HYSTEROSCOPY;  Surgeon: Marvene Staff, MD;  Location: [facility];  Service: Gynecology;  Laterality: N/A;  . INDUCED ABORTION    . LAPAROSCOPY  1992   endometriosis  . LAPAROSCOPY N/A 07/19/2013   Procedure: LAPAROSCOPY DIAGNOSTIC  with resection of endometriosis;  Surgeon: Marvene Staff, MD;  Location: [facility];  Service: Gynecology;  Laterality: N/A;  . metatarsil      left foot surgery     OB History    Gravida Para Term Preterm AB Living   6 2 2  0 4 2  SAB TAB Ectopic Multiple Live Births   2 2 0 0         Home Medications    Prior to Admission medications   Medication Sig Start Date End Date Taking? Authorizing Provider  Cyanocobalamin (B-12) 1000 MCG SUBL Place 1 mL under the tongue daily. 01/10/16  Yes Sowles, Drue Stager, MD  DULoxetine (CYMBALTA) 60 MG capsule Take 1 capsule (60 mg total) by mouth daily. 01/29/17  Yes Sowles, Drue Stager, MD  hydrochlorothiazide (HYDRODIURIL) 25 MG tablet Take 1 tablet (25 mg total) by mouth daily. Take half a tablet daily Patient taking differently: Take 25 mg by mouth daily.  01/29/17  Yes Sowles, Drue Stager, MD  metFORMIN  (GLUCOPHAGE XR) 750 MG 24 hr tablet Take 1 tablet (750 mg total) by mouth daily with breakfast. 04/23/17  Yes Ancil Boozer, Drue Stager, MD  Multiple Vitamin (MULTI-VITAMINS) TABS Take by mouth.   Yes [provider]  Semaglutide (OZEMPIC) 0.25 or 0.5 MG/DOSE SOPN Inject 0.5 mg into the skin once a week. 04/23/17  Yes Sowles, Drue Stager, MD  tiZANidine (ZANAFLEX) 4 MG capsule Take 1 capsule (4 mg total) by mouth at bedtime. 04/23/17  Yes Sowles, Drue Stager, MD  Vitamin D, Ergocalciferol, (DRISDOL) 50000 units CAPS capsule Take 1 capsule (50,000 Units total) by mouth every 7 (seven) days. 04/23/17  Yes Sowles, Drue Stager, MD  Armodafinil 150 MG tablet Take 1 tablet (150 mg total) by mouth daily. Patient not taking: Reported on 06/24/2017 03/08/17   Steele Sizer, MD  omeprazole (PRILOSEC) 20 MG capsule Take 1 capsule (20 mg total) by mouth daily. Patient not taking: Reported on 06/24/2017 05/13/17   Zigmund Gottron, NP    Family History Family History  Problem Relation Age of Onset  . Diabetes Mother   . Hypertension Mother   . Hypertension Father   . Kidney disease Father   . Heart disease Brother   . Anesthesia problems Neg Hx   . Hypotension Neg Hx   . Malignant hyperthermia Neg Hx   . Pseudochol deficiency Neg Hx     Social History Social History   Tobacco Use  . Smoking status: Current Every Day Smoker    Packs/day: 0.25    Years: 21.00    Pack years: 5.25    Types: Cigarettes    Start date: 11/25/1993  . Smokeless tobacco: Never Used  Substance Use Topics  . Alcohol use: No    Alcohol/week: 0.0 oz  . Drug use: No     Allergies   Patient has no known allergies.   Review of Systems Review of Systems  Constitutional: Negative for chills and fever.  Respiratory: Negative for shortness of breath.   Cardiovascular: Negative for chest pain.  Gastrointestinal: Positive for abdominal pain, constipation (chronic, unchanged) and nausea. Negative for diarrhea and vomiting.    Genitourinary: Positive for flank pain. Negative for dysuria, hematuria, urgency, vaginal bleeding, vaginal discharge and vaginal pain.  Musculoskeletal: Negative for neck pain.  All other systems reviewed and are negative.    Physical Exam Updated Vital Signs BP 106/75   Pulse 78   Temp 98.1 F (36.7 C) (Oral)   Resp 16   Ht 5\' 5"  (1.651 m)   Wt 91.2 kg (201 lb)   LMP 07/09/2015 Comment: patient stated that she has been bleeding for about 22 days  SpO2 100%   BMI 33.45 kg/m   Physical Exam  Constitutional: She appears well-developed and well-nourished. No distress.  HENT:  Head: Normocephalic and atraumatic.  Eyes: Conjunctivae  are normal. Right eye exhibits no discharge. Left eye exhibits no discharge.  Neck: Normal range of motion. Neck supple. No JVD present. No tracheal deviation present.  Cardiovascular: Normal rate, regular rhythm and normal heart sounds.  Pulmonary/Chest: Effort normal and breath sounds normal.  Abdominal: Soft. Bowel sounds are normal. She exhibits no distension and no mass. There is tenderness. There is no rebound and no guarding.  No CVAT, mild left midparalumbar muscle tenderness.  Mild left lower quadrant tenderness to palpation verbalized by the patient but no tenderness when the patient is distracted.  Murphy sign absent, Rovsing's absent.  Musculoskeletal: Normal range of motion. She exhibits no edema.  No midline spine TTP, mild midparlumar muscle tenderness, no deformity, crepitus, or step-off noted   Neurological: She is alert.  Skin: Skin is warm and dry. No erythema.  Psychiatric: She has a normal mood and affect. Her behavior is normal.  Nursing note and vitals reviewed.    ED Treatments / Results  Labs (all labs ordered are listed, but only abnormal results are displayed) Labs Reviewed  URINALYSIS, ROUTINE W REFLEX MICROSCOPIC - Abnormal; Notable for the following components:      Result Value   Color, Urine AMBER (*)     APPearance HAZY (*)    Specific Gravity, Urine 1.031 (*)    Bilirubin Urine MODERATE (*)    Protein, ur 30 (*)    Bacteria, UA RARE (*)    Squamous Epithelial / LPF 6-30 (*)    All other components within normal limits  HEPATIC FUNCTION PANEL - Abnormal; Notable for the following components:   Total Protein 6.4 (*)    Bilirubin, Direct <0.1 (*)    All other components within normal limits  CBC  BASIC METABOLIC PANEL  LIPASE, BLOOD    EKG  EKG Interpretation None       Radiology No results found.  Procedures Procedures (including critical care time)  Medications Ordered in ED Medications  ketorolac (TORADOL) 30 MG/ML injection 30 mg (30 mg Intravenous Given 06/24/17 1137)  sodium chloride 0.9 % bolus 1,000 mL (0 mLs Intravenous Stopped 06/24/17 1346)     Initial Impression / Assessment and Plan / ED Course  I have reviewed the triage vital signs and the nursing notes.  Pertinent labs & imaging results that were available during my care of the patient were reviewed by me and considered in my medical decision making (see chart for details).     Patient presents with left flank pain which began yesterday around 7 PM.  Afebrile, vital signs are stable.  She is nontoxic in appearance.  She has no CVA tenderness, but mild mid-paralumbar muscle tenderness with no midline lumbar spine tenderness.  She also has mild left lower quadrant tenderness to palpation.  UA is not suggestive of UTI or nephrolithiasis, but does suggest dehydration so we will give IV fluids.  Remainder of lab work is unremarkable.  She has no leukocytosis, no electrolyte abnormalities, LFTs are within normal limits, and no elevation in BUN/creatinine.  I doubt PID as patient is status post hysterectomy.  I suspect pain is likely musculoskeletal in nature, however with left lower quadrant abdominal tenderness we will obtain CT scan of the abdomen and pelvis for further evaluation and rule out of diverticulitis or  ovarian abnormalities.  She had some improvement in her symptoms after administration of Toradol.  I doubt obstruction, perforation, AAA, appendicitis, TOA, or ovarian torsion.  2:16 PM Patient wants to leave against medical  advice prior to obtaining CT scan. Patient understands that her actions will lead to inadequate medical workup, and that she is at risk of complications of missed diagnosis, which includes morbidity and mortality.  Alternative options discussed, opportunity to change mind given, discussion witnessed by RN.  Patient is demonstrating good capacity to make decision. Patient understands that she needs to return to the ER immediately if her symptoms get worse.  Discussed strict and specific ED return precautions.  Encouraged patient to follow-up with her primary care physician for reevaluation if her symptoms persist. Pt verbalized understanding of and agreement with the aforementioned.  Final Clinical Impressions(s) / ED Diagnoses   Final diagnoses:  Left flank pain  Left lower quadrant pain    ED Discharge Orders    None       Renita Papa, PA-C 06/24/17 1643    Drenda Freeze, MD 06/25/17 [phone]

## 2017-06-24 NOTE — Discharge Instructions (Signed)
Alternate 600 mg of ibuprofen and [phone] mg of Tylenol every 3 hours as needed for pain. Do not exceed 4000 mg of Tylenol daily.  Apply ice or heat for comfort.  Take some hot baths or hot showers to help relax muscles and do some gentle stretching.  Drink plenty of fluids and get plenty rest.  Follow-up with your primary care physician for reevaluation of your symptoms.  Return to the emergency department if any concerning signs or symptoms develop such as fever, worsening pain, blood in the urine or stool, or persistent vomiting.

## 2017-06-24 NOTE — ED Triage Notes (Signed)
C/o left flank pain onset last pm, states she wasn't able to sleep last pm. Denies pain and burning with urination

## 2017-06-28 ENCOUNTER — Emergency Department (HOSPITAL_COMMUNITY): Payer: Federal, State, Local not specified - PPO

## 2017-06-28 ENCOUNTER — Encounter: Payer: Self-pay | Admitting: Family Medicine

## 2017-06-28 ENCOUNTER — Encounter (HOSPITAL_COMMUNITY): Payer: Self-pay | Admitting: Emergency Medicine

## 2017-06-28 ENCOUNTER — Other Ambulatory Visit: Payer: Self-pay

## 2017-06-28 ENCOUNTER — Emergency Department (HOSPITAL_COMMUNITY)
Admission: EM | Admit: 2017-06-28 | Discharge: 2017-06-28 | Disposition: A | Discharge status: Home / Self Care | Payer: Federal, State, Local not specified - PPO | Attending: Emergency Medicine | Admitting: Emergency Medicine

## 2017-06-28 ENCOUNTER — Other Ambulatory Visit: Payer: Self-pay | Admitting: Obstetrics and Gynecology

## 2017-06-28 DIAGNOSIS — N83202 Unspecified ovarian cyst, left side: Secondary | ICD-10-CM

## 2017-06-28 DIAGNOSIS — F17210 Nicotine dependence, cigarettes, uncomplicated: Secondary | ICD-10-CM | POA: Insufficient documentation

## 2017-06-28 DIAGNOSIS — I10 Essential (primary) hypertension: Secondary | ICD-10-CM | POA: Insufficient documentation

## 2017-06-28 DIAGNOSIS — Z7984 Long term (current) use of oral hypoglycemic drugs: Secondary | ICD-10-CM | POA: Insufficient documentation

## 2017-06-28 DIAGNOSIS — N200 Calculus of kidney: Secondary | ICD-10-CM | POA: Insufficient documentation

## 2017-06-28 DIAGNOSIS — R109 Unspecified abdominal pain: Secondary | ICD-10-CM | POA: Diagnosis not present

## 2017-06-28 DIAGNOSIS — Z79899 Other long term (current) drug therapy: Secondary | ICD-10-CM | POA: Diagnosis not present

## 2017-06-28 DIAGNOSIS — R102 Pelvic and perineal pain: Secondary | ICD-10-CM

## 2017-06-28 DIAGNOSIS — E119 Type 2 diabetes mellitus without complications: Secondary | ICD-10-CM | POA: Diagnosis not present

## 2017-06-28 DIAGNOSIS — N83292 Other ovarian cyst, left side: Secondary | ICD-10-CM | POA: Diagnosis not present

## 2017-06-28 DIAGNOSIS — R1032 Left lower quadrant pain: Secondary | ICD-10-CM | POA: Diagnosis not present

## 2017-06-28 DIAGNOSIS — N838 Other noninflammatory disorders of ovary, fallopian tube and broad ligament: Secondary | ICD-10-CM | POA: Insufficient documentation

## 2017-06-28 LAB — URINALYSIS, ROUTINE W REFLEX MICROSCOPIC
Bilirubin Urine: NEGATIVE
GLUCOSE, UA: NEGATIVE mg/dL
Hgb urine dipstick: NEGATIVE
KETONES UR: 5 mg/dL — AB
LEUKOCYTES UA: NEGATIVE
Nitrite: NEGATIVE
PH: 5 (ref 5.0–8.0)
Protein, ur: NEGATIVE mg/dL
Specific Gravity, Urine: 1.024 (ref 1.005–1.030)

## 2017-06-28 LAB — GC/CHLAMYDIA PROBE AMP (~~LOC~~) NOT AT ARMC
CHLAMYDIA, DNA PROBE: NEGATIVE
NEISSERIA GONORRHEA: NEGATIVE

## 2017-06-28 LAB — WET PREP, GENITAL
CLUE CELLS WET PREP: NONE SEEN
Sperm: NONE SEEN
Trich, Wet Prep: NONE SEEN
WBC WET PREP: NONE SEEN
Yeast Wet Prep HPF POC: NONE SEEN

## 2017-06-28 MED ORDER — SODIUM CHLORIDE 0.9 % IV BOLUS (SEPSIS)
1000.0000 mL | Freq: Once | INTRAVENOUS | Status: AC
  Administered 2017-06-28: 1000 mL via INTRAVENOUS

## 2017-06-28 MED ORDER — HYDROCODONE-ACETAMINOPHEN 5-325 MG PO TABS: 2.0000 | ORAL_TABLET | Freq: Four times a day (QID) | ORAL | 0 refills | Status: DC | PRN

## 2017-06-28 MED ORDER — ONDANSETRON HCL 4 MG/2ML IJ SOLN
4.0000 mg | Freq: Once | INTRAMUSCULAR | Status: AC
  Administered 2017-06-28: 4 mg via INTRAVENOUS
  Filled 2017-06-28: qty 2

## 2017-06-28 MED ORDER — IBUPROFEN 800 MG PO TABS: 800.0000 mg | ORAL_TABLET | Freq: Three times a day (TID) | ORAL | 0 refills | Status: DC | PRN

## 2017-06-28 MED ORDER — ONDANSETRON 4 MG PO TBDP: 4.0000 mg | ORAL_TABLET | Freq: Four times a day (QID) | ORAL | 0 refills | Status: DC | PRN

## 2017-06-28 MED ORDER — KETOROLAC TROMETHAMINE 30 MG/ML IJ SOLN
30.0000 mg | Freq: Once | INTRAMUSCULAR | Status: AC
  Administered 2017-06-28: 30 mg via INTRAVENOUS
  Filled 2017-06-28: qty 1

## 2017-06-28 NOTE — Discharge Instructions (Signed)
Follow-up with your OB/GYN doctor in a week.

## 2017-06-28 NOTE — ED Provider Notes (Addendum)
TIME SEEN: 5:35 AM  CHIEF COMPLAINT: Left flank pain  HPI: Patient is a 42 year old female with history of diabetes, hypertension who presents to the emergency department with left flank pain.  Symptoms ongoing for several days.  No significant aggravating or relieving factors.  No injury to her back.  Has tried over-the-counter medications without any relief.  Has had nausea but no vomiting.  No dysuria or hematuria.  No vaginal bleeding or discharge.  No abdominal pain.  No history of kidney stones.  No numbness, tingling or focal weakness.  Able to ambulate without difficulty.  No bowel or bladder incontinence.  No fever.  Was seen here January 24 of the same.  That time had normal labs and urine.  They offered a CT scan but patient left AGAINST MEDICAL ADVICE before receiving this imaging.  ROS: See HPI Constitutional: no fever  Eyes: no drainage  ENT: no runny nose   Cardiovascular:  no chest pain  Resp: no SOB  GI: no vomiting GU: no dysuria Integumentary: no rash  Allergy: no hives  Musculoskeletal: no leg swelling  Neurological: no slurred speech ROS otherwise negative  PAST MEDICAL HISTORY/PAST SURGICAL HISTORY:  Past Medical History:  Diagnosis Date  . Anxiety    no current meds  . Arthritis    left knee pain - otc med prn  . Chronic insomnia   . Depression   . Diabetes mellitus without complication ([place])   . Dysrhythmia    Home with daughter [name]  . Endometriosis   . GERD (gastroesophageal reflux disease)    diet control - no meds  . Headache(784.0)    hx - last one 2 yrs ago - no meds  . Hypertension   . Metabolic syndrome   . Obesity   . Serum calcium elevated   . Sleep disorder, circadian, shift work type   . SVD (spontaneous vaginal delivery)    x 2  . Tachycardia    History - neg cardiac tests-stress related    MEDICATIONS:  Prior to Admission medications   Medication Sig Start Date End Date Taking? Authorizing Provider  Armodafinil 150 MG  tablet Take 1 tablet (150 mg total) by mouth daily. Patient not taking: Reported on 06/24/2017 03/08/17   Steele Sizer, MD  Cyanocobalamin (B-12) 1000 MCG SUBL Place 1 mL under the tongue daily. 01/10/16   Steele Sizer, MD  DULoxetine (CYMBALTA) 60 MG capsule Take 1 capsule (60 mg total) by mouth daily. 01/29/17   Steele Sizer, MD  hydrochlorothiazide (HYDRODIURIL) 25 MG tablet Take 1 tablet (25 mg total) by mouth daily. Take half a tablet daily Patient taking differently: Take 25 mg by mouth daily.  01/29/17   Steele Sizer, MD  metFORMIN (GLUCOPHAGE XR) 750 MG 24 hr tablet Take 1 tablet (750 mg total) by mouth daily with breakfast. 04/23/17   Steele Sizer, MD  Multiple Vitamin (MULTI-VITAMINS) TABS Take by mouth.    [provider]  omeprazole (PRILOSEC) 20 MG capsule Take 1 capsule (20 mg total) by mouth daily. Patient not taking: Reported on 06/24/2017 05/13/17   Zigmund Gottron, NP  Semaglutide (OZEMPIC) 0.25 or 0.5 MG/DOSE SOPN Inject 0.5 mg into the skin once a week. 04/23/17   Steele Sizer, MD  tiZANidine (ZANAFLEX) 4 MG capsule Take 1 capsule (4 mg total) by mouth at bedtime. 04/23/17   Steele Sizer, MD  Vitamin D, Ergocalciferol, (DRISDOL) 50000 units CAPS capsule Take 1 capsule (50,000 Units total) by mouth every 7 (seven)  days. 04/23/17   Steele Sizer, MD    ALLERGIES:  No Known Allergies  SOCIAL HISTORY:  Social History   Tobacco Use  . Smoking status: Current Every Day Smoker    Packs/day: 0.25    Years: 21.00    Pack years: 5.25    Types: Cigarettes    Start date: 11/25/1993  . Smokeless tobacco: Never Used  Substance Use Topics  . Alcohol use: No    Alcohol/week: 0.0 oz    FAMILY HISTORY: Family History  Problem Relation Age of Onset  . Diabetes Mother   . Hypertension Mother   . Hypertension Father   . Kidney disease Father   . Heart disease Brother   . Anesthesia problems Neg Hx   . Hypotension Neg Hx   . Malignant hyperthermia  Neg Hx   . Pseudochol deficiency Neg Hx     EXAM: BP 130/82 (BP Location: Right Arm)   Pulse (!) 105   Temp 97.8 F (36.6 C) (Oral)   Resp 20   Ht 5\' 5"  (1.651 m)   Wt 91.2 kg (201 lb)   LMP 07/09/2015 Comment: patient stated that she has been bleeding for about 22 days  SpO2 100%   BMI 33.45 kg/m  CONSTITUTIONAL: Alert and oriented and responds appropriately to questions. Well-appearing; well-nourished HEAD: Normocephalic EYES: Conjunctivae clear, pupils appear equal, EOMI ENT: normal nose; moist mucous membranes NECK: Supple, no meningismus, no nuchal rigidity, no LAD  CARD: RRR; S1 and S2 appreciated; no murmurs, no clicks, no rubs, no gallops RESP: Normal chest excursion without splinting or tachypnea; breath sounds clear and equal bilaterally; no wheezes, no rhonchi, no rales, no hypoxia or respiratory distress, speaking full sentences ABD/GI: Normal bowel sounds; non-distended; soft, non-tender, no rebound, no guarding, no peritoneal signs, no hepatosplenomegaly GU:  Normal external genitalia. No lesions, rashes noted. Patient has no vaginal bleeding on exam.  No significant vaginal discharge.  Patient does have some left adnexal tenderness on exam but no fullness or mass appreciated.  No tenderness over the right adnexa.  Cervix is surgically absent.  Chaperone present for exam. BACK:  The back appears normal and is non-tender to palpation, there is no CVA tenderness, no midline spinal tenderness or step-off or deformity EXT: Normal ROM in all joints; non-tender to palpation; no edema; normal capillary refill; no cyanosis, no calf tenderness or swelling    SKIN: Normal color for age and race; warm; no rash NEURO: Moves all extremities equally, normal sensation diffusely, normal gait PSYCH: The patient's mood and manner are appropriate. Grooming and personal hygiene are appropriate.  MEDICAL DECISION MAKING: Patient here with left flank pain.  Not able to reproduce pain with  palpation.  Labs unremarkable including CBC, CMP, lipase on the 24th.  Urine today shows no sign of infection or gross blood.  Will obtain CT scan for further evaluation and to evaluate for possible renal stone but this also could be musculoskeletal.  Nothing at this time to suggest pyelonephritis.  Abdominal exam benign.  Doubt diverticulitis, colitis, bowel obstruction, appendicitis.  Will give IV fluids, Toradol, Zofran.  ED PROGRESS: Patient CT scan shows a 5 mm stone within the left kidney but no ureterolithiasis.  No hydronephrosis.  She does have a 6 cm left adnexal mass.  She does now report that she has had intermittent left pelvic pain and has a history of endometriosis.  Noted recent brown discharge.  She is status post hysterectomy in 2017 at [facility].  States she  has had pelvic pain mostly on the left side since that time.  Her local OB/GYN is Dr. cousins at [facility].  She is sexually active with her husband.  No history of STDs.  Pelvic exam unremarkable other than left adnexal tenderness.  Will proceed with transvaginal ultrasound with Doppler.  Patient did drive herself to the emergency department.  No significant improvement in pain after Toradol.  Declines any other further pain medication currently she states she would like to drive herself home.   Signed out to Dr. Roderic Palau to follow up on TVUS.  Anticipate discharge home with PCP and OB/GYN follow-up.  Will discharge with pain and nausea meds.   I reviewed all nursing notes, vitals, pertinent previous records, EKGs, lab and urine results, imaging (as available).      Aundrea Higginbotham, Delice Bison, DO 06/28/17 0352    Kamayah Pillay, Delice Bison, DO 06/28/17 [phone]

## 2017-06-28 NOTE — ED Notes (Signed)
Spoke to pt again regarding her concerns over wait for treatment room.  Pt concerned that everyone she sees in the waiting room got here after her.  Explained that there are some patients still waiting in the triage area and that pt's go to different areas.

## 2017-06-28 NOTE — ED Provider Notes (Signed)
Ultrasound shows large ovarian cyst on the left.  Patient will be sent home with pain medicine and is to follow-up with her OB/GYN doctor in a week   Milton Ferguson, MD 06/28/17 [phone]

## 2017-06-28 NOTE — ED Notes (Signed)
ED Provider at bedside. 

## 2017-06-28 NOTE — ED Notes (Signed)
Pt remains in waiting room. Updated on wait for treatment room. 

## 2017-06-28 NOTE — ED Triage Notes (Signed)
Reports left flank pain for a week.  Was seen here Thursday but left prior to seeing a physician.  Denies any urinary symptoms.

## 2017-07-02 DIAGNOSIS — R1032 Left lower quadrant pain: Secondary | ICD-10-CM | POA: Diagnosis not present

## 2017-07-02 DIAGNOSIS — N83202 Unspecified ovarian cyst, left side: Secondary | ICD-10-CM | POA: Diagnosis not present

## 2017-07-14 ENCOUNTER — Other Ambulatory Visit: Payer: Self-pay | Admitting: Family Medicine

## 2017-07-14 DIAGNOSIS — E8881 Metabolic syndrome: Secondary | ICD-10-CM

## 2017-07-14 DIAGNOSIS — E669 Obesity, unspecified: Secondary | ICD-10-CM

## 2017-07-14 NOTE — Telephone Encounter (Signed)
Refill request for general medication: Ozempic to [facility].   Last office visit: 04/23/2018  Follow up on 07/27/2017

## 2017-07-27 ENCOUNTER — Encounter: Payer: Self-pay | Admitting: Family Medicine

## 2017-07-27 ENCOUNTER — Ambulatory Visit: Payer: Federal, State, Local not specified - PPO | Admitting: Family Medicine

## 2017-07-27 VITALS — BP 122/84 | HR 80 | Temp 98.3°F | Resp 16 | Ht 65.0 in | Wt 194.9 lb

## 2017-07-27 DIAGNOSIS — M5412 Radiculopathy, cervical region: Secondary | ICD-10-CM | POA: Diagnosis not present

## 2017-07-27 DIAGNOSIS — N83299 Other ovarian cyst, unspecified side: Secondary | ICD-10-CM | POA: Diagnosis not present

## 2017-07-27 DIAGNOSIS — F320 Major depressive disorder, single episode, mild: Secondary | ICD-10-CM

## 2017-07-27 DIAGNOSIS — I10 Essential (primary) hypertension: Secondary | ICD-10-CM | POA: Diagnosis not present

## 2017-07-27 DIAGNOSIS — G4700 Insomnia, unspecified: Secondary | ICD-10-CM | POA: Diagnosis not present

## 2017-07-27 DIAGNOSIS — E8881 Metabolic syndrome: Secondary | ICD-10-CM

## 2017-07-27 DIAGNOSIS — N200 Calculus of kidney: Secondary | ICD-10-CM | POA: Diagnosis not present

## 2017-07-27 DIAGNOSIS — E559 Vitamin D deficiency, unspecified: Secondary | ICD-10-CM | POA: Diagnosis not present

## 2017-07-27 DIAGNOSIS — G4726 Circadian rhythm sleep disorder, shift work type: Secondary | ICD-10-CM

## 2017-07-27 DIAGNOSIS — E041 Nontoxic single thyroid nodule: Secondary | ICD-10-CM | POA: Diagnosis not present

## 2017-07-27 DIAGNOSIS — E669 Obesity, unspecified: Secondary | ICD-10-CM

## 2017-07-27 MED ORDER — DULOXETINE HCL 60 MG PO CPEP: 60.0000 mg | ORAL_CAPSULE | Freq: Every day | ORAL | 1 refills | Status: DC

## 2017-07-27 MED ORDER — SEMAGLUTIDE(0.25 OR 0.5MG/DOS) 2 MG/1.5ML ~~LOC~~ SOPN: 0.5000 mg | PEN_INJECTOR | SUBCUTANEOUS | 1 refills | Status: DC

## 2017-07-27 MED ORDER — HYDROCHLOROTHIAZIDE 25 MG PO TABS: 25.0000 mg | ORAL_TABLET | Freq: Every day | ORAL | 1 refills | Status: DC
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
## 2017-07-27 NOTE — Patient Instructions (Addendum)
Calorie Counting for Weight Loss Calories are units of energy. Your body needs a certain amount of calories from food to keep you going throughout the day. When you eat more calories than your body needs, your body stores the extra calories as fat. When you eat fewer calories than your body needs, your body burns fat to get the energy it needs. Calorie counting means keeping track of how many calories you eat and drink each day. Calorie counting can be helpful if you need to lose weight. If you make sure to eat fewer calories than your body needs, you should lose weight. Ask your health care provider what a healthy weight is for you. For calorie counting to work, you will need to eat the right number of calories in a day in order to lose a healthy amount of weight per week. A dietitian can help you determine how many calories you need in a day and will give you suggestions on how to reach your calorie goal.  A healthy amount of weight to lose per week is usually 1-2 lb (0.5-0.9 kg). This usually means that your daily calorie intake should be reduced by 500-750 calories.  Eating 1,200 - 1,500 calories per day can help most women lose weight.  Eating 1,500 - 1,800 calories per day can help most men lose weight.  What is my plan? My goal is to have __________ calories per day. If I have this many calories per day, I should lose around __________ pounds per week. What do I need to know about calorie counting? In order to meet your daily calorie goal, you will need to:  Find out how many calories are in each food you would like to eat. Try to do this before you eat.  Decide how much of the food you plan to eat.  Write down what you ate and how many calories it had. Doing this is called keeping a food log.  To successfully lose weight, it is important to balance calorie counting with a healthy lifestyle that includes regular activity. Aim for 150 minutes of moderate exercise (such as walking) or 75  minutes of vigorous exercise (such as running) each week. Where do I find calorie information?  The number of calories in a food can be found on a Nutrition Facts label. If a food does not have a Nutrition Facts label, try to look up the calories online or ask your dietitian for help. Remember that calories are listed per serving. If you choose to have more than one serving of a food, you will have to multiply the calories per serving by the amount of servings you plan to eat. For example, the label on a package of bread might say that a serving size is 1 slice and that there are 90 calories in a serving. If you eat 1 slice, you will have eaten 90 calories. If you eat 2 slices, you will have eaten 180 calories. How do I keep a food log? Immediately after each meal, record the following information in your food log:  What you ate. Don't forget to include toppings, sauces, and other extras on the food.  How much you ate. This can be measured in cups, ounces, or number of items.  How many calories each food and drink had.  The total number of calories in the meal.  Keep your food log near you, such as in a small notebook in your pocket, or use a mobile app or website. Some   programs will calculate calories for you and show you how many calories you have left for the day to meet your goal. What are some calorie counting tips?  Use your calories on foods and drinks that will fill you up and not leave you hungry: ? Some examples of foods that fill you up are nuts and nut butters, vegetables, lean proteins, and high-fiber foods like whole grains. High-fiber foods are foods with more than 5 g fiber per serving. ? Drinks such as sodas, specialty coffee drinks, alcohol, and juices have a lot of calories, yet do not fill you up.  Eat nutritious foods and avoid empty calories. Empty calories are calories you get from foods or beverages that do not have many vitamins or protein, such as candy, sweets, and  soda. It is better to have a nutritious high-calorie food (such as an avocado) than a food with few nutrients (such as a bag of chips).  Know how many calories are in the foods you eat most often. This will help you calculate calorie counts faster.  Pay attention to calories in drinks. Low-calorie drinks include water and unsweetened drinks.  Pay attention to nutrition labels for "low fat" or "fat free" foods. These foods sometimes have the same amount of calories or more calories than the full fat versions. They also often have added sugar, starch, or salt, to make up for flavor that was removed with the fat.  Find a way of tracking calories that works for you. Get creative. Try different apps or programs if writing down calories does not work for you. What are some portion control tips?  Know how many calories are in a serving. This will help you know how many servings of a certain food you can have.  Use a measuring cup to measure serving sizes. You could also try weighing out portions on a kitchen scale. With time, you will be able to estimate serving sizes for some foods.  Take some time to put servings of different foods on your favorite plates, bowls, and cups so you know what a serving looks like.  Try not to eat straight from a bag or box. Doing this can lead to overeating. Put the amount you would like to eat in a cup or on a plate to make sure you are eating the right portion.  Use smaller plates, glasses, and bowls to prevent overeating.  Try not to multitask (for example, watch TV or use your computer) while eating. If it is time to eat, sit down at a table and enjoy your food. This will help you to know when you are full. It will also help you to be aware of what you are eating and how much you are eating. What are tips for following this plan? Reading food labels  Check the calorie count compared to the serving size. The serving size may be smaller than what you are used to  eating.  Check the source of the calories. Make sure the food you are eating is high in vitamins and protein and low in saturated and trans fats. Shopping  Read nutrition labels while you shop. This will help you make healthy decisions before you decide to purchase your food.  Make a grocery list and stick to it. Cooking  Try to cook your favorite foods in a healthier way. For example, try baking instead of frying.  Use low-fat dairy products. Meal planning  Use more fruits and vegetables. Half of your plate should   be fruits and vegetables.  Include lean proteins like poultry and fish. How do I count calories when eating out?  Ask for smaller portion sizes.  Consider sharing an entree and sides instead of getting your own entree.  If you get your own entree, eat only half. Ask for a box at the beginning of your meal and put the rest of your entree in it so you are not tempted to eat it.  If calories are listed on the menu, choose the lower calorie options.  Choose dishes that include vegetables, fruits, whole grains, low-fat dairy products, and lean protein.  Choose items that are boiled, broiled, grilled, or steamed. Stay away from items that are buttered, battered, fried, or served with cream sauce. Items labeled "crispy" are usually fried, unless stated otherwise.  Choose water, low-fat milk, unsweetened iced tea, or other drinks without added sugar. If you want an alcoholic beverage, choose a lower calorie option such as a glass of wine or light beer.  Ask for dressings, sauces, and syrups on the side. These are usually high in calories, so you should limit the amount you eat.  If you want a salad, choose a garden salad and ask for grilled meats. Avoid extra toppings like bacon, cheese, or fried items. Ask for the dressing on the side, or ask for olive oil and vinegar or lemon to use as dressing.  Estimate how many servings of a food you are given. For example, a serving of  cooked rice is  cup or about the size of half a baseball. Knowing serving sizes will help you be aware of how much food you are eating at restaurants. The list below tells you how big or small some common portion sizes are based on everyday objects: ? 1 oz-4 stacked dice. ? 3 oz-1 deck of cards. ? 1 tsp-1 die. ? 1 Tbsp- a ping-pong ball. ? 2 Tbsp-1 ping-pong ball. ?  cup- baseball. ? 1 cup-1 baseball. Summary  Calorie counting means keeping track of how many calories you eat and drink each day. If you eat fewer calories than your body needs, you should lose weight.  A healthy amount of weight to lose per week is usually 1-2 lb (0.5-0.9 kg). This usually means reducing your daily calorie intake by 500-750 calories.  The number of calories in a food can be found on a Nutrition Facts label. If a food does not have a Nutrition Facts label, try to look up the calories online or ask your dietitian for help.  Use your calories on foods and drinks that will fill you up, and not on foods and drinks that will leave you hungry.  Use smaller plates, glasses, and bowls to prevent overeating. This information is not intended to replace advice given to you by your health care provider. Make sure you discuss any questions you have with your health care provider. Document Released: 05/18/2005 Document Revised: 04/17/2016 Document Reviewed: 04/17/2016 Elsevier Interactive Patient Education  2018 [employer].  Smoking Tobacco Information Smoking tobacco will very likely harm your health. Tobacco contains a poisonous (toxic), colorless chemical called nicotine. Nicotine affects the brain and makes tobacco addictive. This change in your brain can make it hard to stop smoking. Tobacco also has other toxic chemicals that can hurt your body and raise your risk of many cancers. How can smoking tobacco affect me? Smoking tobacco can increase your chances of having serious health conditions, such as:  Cancer.  Smoking is most commonly associated  with lung cancer, but can lead to cancer in other parts of the body.  Chronic obstructive pulmonary disease (COPD). This is a long-term lung condition that makes it hard to breathe. It also gets worse over time.  High blood pressure (hypertension), heart disease, stroke, or heart attack.  Lung infections, such as pneumonia.  Cataracts. This is when the lenses in the eyes become clouded.  Digestive problems. This may include peptic ulcers, heartburn, and gastroesophageal reflux disease (GERD).  Oral health problems, such as gum disease and tooth loss.  Loss of taste and smell.  Smoking can affect your appearance by causing:  Wrinkles.  Yellow or stained teeth, fingers, and fingernails.  Smoking tobacco can also affect your social life.  Many workplaces, [employer], hotels, and public places are tobacco-free. This means that you may experience challenges in finding places to smoke when away from home.  The cost of a smoking habit can be expensive. Expenses for someone who smokes come in two ways: ? You spend money on a regular basis to buy tobacco. ? Your health care costs in the long-term are higher if you smoke.  Tobacco smoke can also affect the health of those around you. Children of smokers have greater chances of: ? Sudden infant death syndrome (SIDS). ? Ear infections. ? Lung infections.  What lifestyle changes can be made?  Do not start smoking. Quit if you already do.  To quit smoking: ? Make a plan to quit smoking and commit yourself to it. Look for programs to help you and ask your health care provider for recommendations and ideas. ? Talk with your health care provider about using nicotine replacement medicines to help you quit. Medicine replacement medicines include gum, lozenges, patches, sprays, or pills. ? Do not replace cigarette smoking with electronic cigarettes, which are commonly called e-cigarettes. The safety of  e-cigarettes is not known, and some may contain harmful chemicals. ? Avoid places, people, or situations that tempt you to smoke. ? If you try to quit but return to smoking, don't give up hope. It is very common for people to try a number of times before they fully succeed. When you feel ready again, give it another try.  Quitting smoking might affect the way you eat as well as your weight. Be prepared to monitor your eating habits. Get support in planning and following a healthy diet.  Ask your health care provider about having regular tests (screenings) to check for cancer. This may include blood tests, imaging tests, and other tests.  Exercise regularly. Consider taking walks, joining a gym, or doing yoga or exercise classes.  Develop skills to manage your stress. These skills include meditation. What are the benefits of quitting smoking? By quitting smoking, you may:  Lower your risk of getting cancer and other diseases caused by smoking.  Live longer.  Breathe better.  Lower your blood pressure and heart rate.  Stop your addiction to tobacco.  Stop creating secondhand smoke that hurts other people.  Improve your sense of taste and smell.  Look better over time, due to having fewer wrinkles and less staining.  What can happen if changes are not made? If you do not stop smoking, you may:  Get cancer and other diseases.  Develop COPD or other long-term (chronic) lung conditions.  Develop serious problems with your heart and blood vessels (cardiovascular system).  Need more tests to screen for problems caused by smoking.  Have higher, long-term healthcare costs from medicines or treatments related  to smoking.  Continue to have worsening changes in your lungs, mouth, and nose.  Where to find support: To get support to quit smoking, consider:  Asking your health care provider for more information and resources.  Taking classes to learn more about quitting  smoking.  Looking for local organizations that offer resources about quitting smoking.  Joining a support group for people who want to quit smoking in your local community.  Where to find more information: You may find more information about quitting smoking from:  HelpGuide.org: www.helpguide.org/articles/addictions/how-to-quit-smoking.htm  https://hall.com/: smokefree.gov  American Lung Association: www.lung.org  Contact a health care provider if:  You have problems breathing.  Your lips, nose, or fingers turn blue.  You have chest pain.  You are coughing up blood.  You feel faint or you pass out.  You have other noticeable changes that cause you to worry. Summary  Smoking tobacco can negatively affect your health, the health of those around you, your finances, and your social life.  Do not start smoking. Quit if you already do. If you need help quitting, ask your health care provider.  Think about joining a support group for people who want to quit smoking in your local community. There are many effective programs that will help you to quit this behavior. This information is not intended to replace advice given to you by your health care provider. Make sure you discuss any questions you have with your health care provider. Document Released: 06/02/2016 Document Revised: 06/02/2016 Document Reviewed: 06/02/2016 Elsevier Interactive Patient Education  [employer].

## 2017-08-02 DIAGNOSIS — N83202 Unspecified ovarian cyst, left side: Secondary | ICD-10-CM | POA: Diagnosis not present

## 2017-08-02 DIAGNOSIS — R1032 Left lower quadrant pain: Secondary | ICD-10-CM | POA: Diagnosis not present

## 2017-08-05 ENCOUNTER — Other Ambulatory Visit: Payer: Self-pay | Admitting: Family Medicine

## 2017-08-05 DIAGNOSIS — E559 Vitamin D deficiency, unspecified: Secondary | ICD-10-CM

## 2017-09-09 ENCOUNTER — Observation Stay (HOSPITAL_COMMUNITY): Payer: Federal, State, Local not specified - PPO

## 2017-09-09 ENCOUNTER — Observation Stay (HOSPITAL_COMMUNITY)
Admission: AD | Admit: 2017-09-09 | Discharge: 2017-09-10 | Disposition: A | Discharge status: Home / Self Care | Payer: Federal, State, Local not specified - PPO | Source: Ambulatory Visit | Attending: Internal Medicine | Admitting: Internal Medicine

## 2017-09-09 ENCOUNTER — Other Ambulatory Visit: Payer: Self-pay | Admitting: Family Medicine

## 2017-09-09 ENCOUNTER — Inpatient Hospital Stay (HOSPITAL_COMMUNITY): Payer: Federal, State, Local not specified - PPO

## 2017-09-09 ENCOUNTER — Other Ambulatory Visit: Payer: Self-pay

## 2017-09-09 ENCOUNTER — Encounter (HOSPITAL_COMMUNITY): Payer: Self-pay | Admitting: *Deleted

## 2017-09-09 DIAGNOSIS — F419 Anxiety disorder, unspecified: Secondary | ICD-10-CM | POA: Insufficient documentation

## 2017-09-09 DIAGNOSIS — F329 Major depressive disorder, single episode, unspecified: Secondary | ICD-10-CM | POA: Diagnosis not present

## 2017-09-09 DIAGNOSIS — N200 Calculus of kidney: Secondary | ICD-10-CM | POA: Diagnosis not present

## 2017-09-09 DIAGNOSIS — K219 Gastro-esophageal reflux disease without esophagitis: Secondary | ICD-10-CM | POA: Diagnosis not present

## 2017-09-09 DIAGNOSIS — E669 Obesity, unspecified: Secondary | ICD-10-CM

## 2017-09-09 DIAGNOSIS — A419 Sepsis, unspecified organism: Secondary | ICD-10-CM | POA: Insufficient documentation

## 2017-09-09 DIAGNOSIS — Z7984 Long term (current) use of oral hypoglycemic drugs: Secondary | ICD-10-CM | POA: Diagnosis not present

## 2017-09-09 DIAGNOSIS — E876 Hypokalemia: Secondary | ICD-10-CM | POA: Insufficient documentation

## 2017-09-09 DIAGNOSIS — G8929 Other chronic pain: Secondary | ICD-10-CM | POA: Diagnosis not present

## 2017-09-09 DIAGNOSIS — E8881 Metabolic syndrome: Secondary | ICD-10-CM

## 2017-09-09 DIAGNOSIS — R109 Unspecified abdominal pain: Secondary | ICD-10-CM

## 2017-09-09 DIAGNOSIS — N83202 Unspecified ovarian cyst, left side: Secondary | ICD-10-CM | POA: Insufficient documentation

## 2017-09-09 DIAGNOSIS — F17210 Nicotine dependence, cigarettes, uncomplicated: Secondary | ICD-10-CM | POA: Diagnosis not present

## 2017-09-09 DIAGNOSIS — N83292 Other ovarian cyst, left side: Secondary | ICD-10-CM | POA: Diagnosis not present

## 2017-09-09 DIAGNOSIS — R102 Pelvic and perineal pain: Secondary | ICD-10-CM | POA: Diagnosis present

## 2017-09-09 DIAGNOSIS — Z79899 Other long term (current) drug therapy: Secondary | ICD-10-CM | POA: Diagnosis not present

## 2017-09-09 DIAGNOSIS — R7303 Prediabetes: Secondary | ICD-10-CM | POA: Diagnosis not present

## 2017-09-09 DIAGNOSIS — I10 Essential (primary) hypertension: Secondary | ICD-10-CM | POA: Insufficient documentation

## 2017-09-09 DIAGNOSIS — N10 Acute tubulo-interstitial nephritis: Secondary | ICD-10-CM | POA: Diagnosis not present

## 2017-09-09 DIAGNOSIS — Z9109 Other allergy status, other than to drugs and biological substances: Secondary | ICD-10-CM | POA: Insufficient documentation

## 2017-09-09 DIAGNOSIS — Z8249 Family history of ischemic heart disease and other diseases of the circulatory system: Secondary | ICD-10-CM | POA: Diagnosis not present

## 2017-09-09 DIAGNOSIS — Z841 Family history of disorders of kidney and ureter: Secondary | ICD-10-CM | POA: Diagnosis not present

## 2017-09-09 DIAGNOSIS — K625 Hemorrhage of anus and rectum: Secondary | ICD-10-CM | POA: Diagnosis not present

## 2017-09-09 DIAGNOSIS — Z6831 Body mass index (BMI) 31.0-31.9, adult: Secondary | ICD-10-CM | POA: Diagnosis not present

## 2017-09-09 DIAGNOSIS — N209 Urinary calculus, unspecified: Secondary | ICD-10-CM | POA: Diagnosis not present

## 2017-09-09 DIAGNOSIS — F320 Major depressive disorder, single episode, mild: Secondary | ICD-10-CM | POA: Diagnosis present

## 2017-09-09 LAB — URINALYSIS, ROUTINE W REFLEX MICROSCOPIC
Bilirubin Urine: NEGATIVE
GLUCOSE, UA: NEGATIVE mg/dL
HGB URINE DIPSTICK: NEGATIVE
Ketones, ur: NEGATIVE mg/dL
Leukocytes, UA: NEGATIVE
NITRITE: NEGATIVE
PH: 6 (ref 5.0–8.0)
PROTEIN: 30 mg/dL — AB
SPECIFIC GRAVITY, URINE: 1.021 (ref 1.005–1.030)

## 2017-09-09 LAB — CBC WITH DIFFERENTIAL/PLATELET
Basophils Absolute: 0 10*3/uL (ref 0.0–0.1)
Basophils Relative: 0 %
EOS PCT: 0 %
Eosinophils Absolute: 0 10*3/uL (ref 0.0–0.7)
HEMATOCRIT: 40.3 % (ref 36.0–46.0)
Hemoglobin: 14.3 g/dL (ref 12.0–15.0)
LYMPHS ABS: 1.5 10*3/uL (ref 0.7–4.0)
LYMPHS PCT: 15 %
MCH: 30.2 pg (ref 26.0–34.0)
MCHC: 35.5 g/dL (ref 30.0–36.0)
MCV: 85.2 fL (ref 78.0–100.0)
MONO ABS: 0.3 10*3/uL (ref 0.1–1.0)
Monocytes Relative: 3 %
NEUTROS ABS: 8 10*3/uL — AB (ref 1.7–7.7)
Neutrophils Relative %: 82 %
PLATELETS: 256 10*3/uL (ref 150–400)
RBC: 4.73 MIL/uL (ref 3.87–5.11)
RDW: 12.8 % (ref 11.5–15.5)
WBC: 9.9 10*3/uL (ref 4.0–10.5)

## 2017-09-09 LAB — CBC
HEMATOCRIT: 41.5 % (ref 36.0–46.0)
HEMOGLOBIN: 14.2 g/dL (ref 12.0–15.0)
MCH: 29.8 pg (ref 26.0–34.0)
MCHC: 34.2 g/dL (ref 30.0–36.0)
MCV: 87 fL (ref 78.0–100.0)
Platelets: 240 10*3/uL (ref 150–400)
RBC: 4.77 MIL/uL (ref 3.87–5.11)
RDW: 12.7 % (ref 11.5–15.5)
WBC: 7.6 10*3/uL (ref 4.0–10.5)

## 2017-09-09 LAB — COMPREHENSIVE METABOLIC PANEL
ALT: 12 U/L — AB (ref 14–54)
AST: 14 U/L — ABNORMAL LOW (ref 15–41)
Albumin: 3.8 g/dL (ref 3.5–5.0)
Alkaline Phosphatase: 55 U/L (ref 38–126)
Anion gap: 11 (ref 5–15)
BILIRUBIN TOTAL: 1.2 mg/dL (ref 0.3–1.2)
BUN: 7 mg/dL (ref 6–20)
CO2: 25 mmol/L (ref 22–32)
CREATININE: 0.8 mg/dL (ref 0.44–1.00)
Calcium: 9.3 mg/dL (ref 8.9–10.3)
Chloride: 97 mmol/L — ABNORMAL LOW (ref 101–111)
Glucose, Bld: 108 mg/dL — ABNORMAL HIGH (ref 65–99)
POTASSIUM: 2.7 mmol/L — AB (ref 3.5–5.1)
Sodium: 133 mmol/L — ABNORMAL LOW (ref 135–145)
TOTAL PROTEIN: 6.8 g/dL (ref 6.5–8.1)

## 2017-09-09 LAB — GLUCOSE, CAPILLARY
GLUCOSE-CAPILLARY: 142 mg/dL — AB (ref 65–99)
GLUCOSE-CAPILLARY: 107 mg/dL — AB (ref 65–99)

## 2017-09-09 LAB — CREATININE, SERUM
CREATININE: 0.89 mg/dL (ref 0.44–1.00)
GFR calc Af Amer: >60 mL/min (ref >60)
GFR calc non Af Amer: >60 mL/min (ref >60)

## 2017-09-09 LAB — MAGNESIUM: MAGNESIUM: 1.5 mg/dL — AB (ref 1.7–2.4)

## 2017-09-09 MED ORDER — POTASSIUM CHLORIDE CRYS ER 20 MEQ PO TBCR
40.0000 meq | EXTENDED_RELEASE_TABLET | ORAL | Status: AC
  Administered 2017-09-09 (×2): 40 meq via ORAL
  Filled 2017-09-09 (×2): qty 2

## 2017-09-09 MED ORDER — VITAMIN B-12 1000 MCG PO TABS
1000.0000 ug | ORAL_TABLET | Freq: Every day | ORAL | Status: DC
  Administered 2017-09-10: 1000 ug via ORAL
  Filled 2017-09-09: qty 1

## 2017-09-09 MED ORDER — IBUPROFEN 800 MG PO TABS: 800.0000 mg | ORAL_TABLET | Freq: Three times a day (TID) | ORAL | Status: DC | PRN

## 2017-09-09 MED ORDER — INSULIN ASPART 100 UNIT/ML ~~LOC~~ SOLN: 0.0000 [IU] | Freq: Every day | SUBCUTANEOUS | Status: DC

## 2017-09-09 MED ORDER — INSULIN ASPART 100 UNIT/ML ~~LOC~~ SOLN
0.0000 [IU] | Freq: Three times a day (TID) | SUBCUTANEOUS | Status: DC
  Administered 2017-09-09 – 2017-09-10 (×2): 1 [IU] via SUBCUTANEOUS

## 2017-09-09 MED ORDER — POTASSIUM CHLORIDE 10 MEQ/100ML IV SOLN
10.0000 meq | INTRAVENOUS | Status: AC
  Administered 2017-09-09 (×4): 10 meq via INTRAVENOUS
  Filled 2017-09-09 (×4): qty 100

## 2017-09-09 MED ORDER — LACTATED RINGERS IV SOLN: INTRAVENOUS | Status: DC

## 2017-09-09 MED ORDER — ONDANSETRON 4 MG PO TBDP: 4.0000 mg | ORAL_TABLET | Freq: Four times a day (QID) | ORAL | Status: DC | PRN

## 2017-09-09 MED ORDER — ACETAMINOPHEN 325 MG PO TABS: 650.0000 mg | ORAL_TABLET | Freq: Four times a day (QID) | ORAL | Status: DC | PRN

## 2017-09-09 MED ORDER — OXYCODONE HCL 5 MG PO TABS
5.0000 mg | ORAL_TABLET | ORAL | Status: DC | PRN
  Administered 2017-09-09 – 2017-09-10 (×3): 5 mg via ORAL
  Filled 2017-09-09 (×3): qty 1

## 2017-09-09 MED ORDER — TAPENTADOL HCL 75 MG PO TABS: 75.0000 mg | ORAL_TABLET | Freq: Four times a day (QID) | ORAL | Status: DC | PRN

## 2017-09-09 MED ORDER — SODIUM CHLORIDE 0.9 % IV SOLN
1.0000 g | INTRAVENOUS | Status: DC
  Administered 2017-09-09 – 2017-09-10 (×2): 1 g via INTRAVENOUS
  Filled 2017-09-09: qty 10
  Filled 2017-09-09: qty 1

## 2017-09-09 MED ORDER — DULOXETINE HCL 60 MG PO CPEP
60.0000 mg | ORAL_CAPSULE | Freq: Every day | ORAL | Status: DC
  Administered 2017-09-09 – 2017-09-10 (×2): 60 mg via ORAL
  Filled 2017-09-09 (×2): qty 1

## 2017-09-09 MED ORDER — CYCLOBENZAPRINE HCL 10 MG PO TABS: 10.0000 mg | ORAL_TABLET | Freq: Three times a day (TID) | ORAL | Status: DC | PRN

## 2017-09-09 MED ORDER — LACTATED RINGERS IV BOLUS
1000.0000 mL | Freq: Once | INTRAVENOUS | Status: AC
  Administered 2017-09-09: 1000 mL via INTRAVENOUS

## 2017-09-09 MED ORDER — SODIUM CHLORIDE 0.9 % IV SOLN
INTRAVENOUS | Status: DC
  Administered 2017-09-09: 09:00:00 via INTRAVENOUS

## 2017-09-09 MED ORDER — ACETAMINOPHEN 500 MG PO TABS
1000.0000 mg | ORAL_TABLET | Freq: Once | ORAL | Status: AC
Start: 2017-09-09 — End: 2017-09-09
  Administered 2017-09-09: 1000 mg via ORAL
  Filled 2017-09-09: qty 2

## 2017-09-09 MED ORDER — LACTATED RINGERS IV SOLN
INTRAVENOUS | Status: DC
  Administered 2017-09-09 – 2017-09-10 (×3): via INTRAVENOUS

## 2017-09-09 MED ORDER — MAGNESIUM SULFATE 4 GM/100ML IV SOLN
4.0000 g | Freq: Once | INTRAVENOUS | Status: AC
  Administered 2017-09-09: 4 g via INTRAVENOUS
  Filled 2017-09-09: qty 100

## 2017-09-09 MED ORDER — LACTATED RINGERS IV BOLUS: 1000.0000 mL | Freq: Once | INTRAVENOUS | Status: AC

## 2017-09-09 MED ORDER — ENOXAPARIN SODIUM 40 MG/0.4ML ~~LOC~~ SOLN
40.0000 mg | SUBCUTANEOUS | Status: DC
  Administered 2017-09-09: 40 mg via SUBCUTANEOUS
  Filled 2017-09-09: qty 0.4

## 2017-09-09 MED ORDER — HYDROCHLOROTHIAZIDE 25 MG PO TABS
25.0000 mg | ORAL_TABLET | Freq: Every day | ORAL | Status: DC
  Administered 2017-09-09 – 2017-09-10 (×2): 25 mg via ORAL
  Filled 2017-09-09 (×2): qty 1

## 2017-09-09 MED ORDER — B-12 1000 MCG SL SUBL: 1.0000 mL | SUBLINGUAL_TABLET | Freq: Every day | SUBLINGUAL | Status: DC

## 2017-09-09 MED ORDER — ACETAMINOPHEN 325 MG PO TABS
650.0000 mg | ORAL_TABLET | Freq: Four times a day (QID) | ORAL | Status: DC | PRN
  Administered 2017-09-09 – 2017-09-10 (×2): 650 mg via ORAL
  Filled 2017-09-09 (×2): qty 2

## 2017-09-09 NOTE — [facility] Note (Signed)
CRITICAL VALUE ALERT  Critical Value:  Potassium 2.7  Date & Time Notied:  09/09/16 @ 1834  Provider Notified: Dr. Murrell Redden @ [phone]  Orders Received/Actions taken: No new orders @ this time.

## 2017-09-09 NOTE — H&P (Signed)
History and Physical    Stacey Stanley SWN:[id number] DOB: July 03, 1975 DOA: 09/09/2017  PCP: Stacey Sizer, MD  Patient coming from: home  I have personally briefly reviewed patient's old medical records in [place]  Chief Complaint: L flank pain, pelvic pain  HPI: Stacey Stanley is Stacey Stanley 42 y.o. female with medical history significant of endometriosis, anxiety, GERD, multiple other medical problems presenting with left-sided flank pain as well as rectal bleeding and fever.    Her symptoms started over about the last few days.  She notes pain on the left flank.  She did not persist with getting bigger and came in due to unbearable pain as well as nausea.  She states that about Stacey Stanley week ago she notes the pain started to get bad.  She describes it as being at the left flank, constant, dull.  Nothing makes it better or worse.  She notes is been progressively worse.  She denies any radiation to the pain.  She notes rectal bleeding that started last week as well.  She notes history of hemorrhoids.  She had one episode of vomiting yesterday and nonbloody nonbilious.  She notes fevers, chills.  She denies cough, shortness of breath, chest pain, abdominal pain, diarrhea, numbness, tingling, weakness, dysuria, lightheadedness or dizziness.  ED Course: She was seen at the [facility] hospital where she had labs and physical exam.  Pelvic exam was benign per report.  She is started on ceftriaxone for concern for Stacey Stanley UTI.  She had abdominal imaging which was unrevealing for cause for the pain.  Transferred to hospitalist for concern for persistent pain which was not improving with fever and rectal bleeding as well.  Review of Systems: As per HPI otherwise 10 point review of systems negative.   Past Medical History:  Diagnosis Date  . Anxiety    no current meds  . Arthritis    left knee pain - otc med prn  . Chronic insomnia   . Depression   . Diabetes mellitus without complication ([place])   .  Dysrhythmia    Home with daughter Stacey Stanley  . Endometriosis   . GERD (gastroesophageal reflux disease)    diet control - no meds  . Headache(784.0)    hx - last one 2 yrs ago - no meds  . Hypertension   . Metabolic syndrome   . Obesity   . Serum calcium elevated   . Sleep disorder, circadian, shift work type   . SVD (spontaneous vaginal delivery)    x 2  . Tachycardia    History - neg cardiac tests-stress related    Past Surgical History:  Procedure Laterality Date  . ABDOMINAL HYSTERECTOMY    . DILATION AND CURETTAGE OF UTERUS    . EYE SURGERY  06/28/13   right laser eye surgery - repair retina  . HYSTEROSCOPY W/D&C N/Daimion Adamcik 07/19/2013   Procedure: DILATATION AND CURETTAGE /HYSTEROSCOPY;  Surgeon: Marvene Staff, MD;  Location: [facility];  Service: Gynecology;  Laterality: N/Loella Hickle;  . INDUCED ABORTION    . LAPAROSCOPY  1992   endometriosis  . LAPAROSCOPY N/Josuel Koeppen 07/19/2013   Procedure: LAPAROSCOPY DIAGNOSTIC  with resection of endometriosis;  Surgeon: Marvene Staff, MD;  Location: [facility];  Service: Gynecology;  Laterality: N/Woodford Strege;  . metatarsil      left foot surgery      reports that she has been smoking cigarettes.  She started smoking about 23 years ago. She has Cyril Woodmansee 6.00 pack-year smoking history. She  has never used smokeless tobacco. She reports that she does not drink alcohol or use drugs.  Allergies  Allergen Reactions  . Nuvigil [Armodafinil]     Chest pain     Family History  Problem Relation Age of Onset  . Diabetes Mother   . Hypertension Mother   . Hypertension Father   . Kidney disease Father   . Heart disease Brother   . Anesthesia problems Neg Hx   . Hypotension Neg Hx   . Malignant hyperthermia Neg Hx   . Pseudochol deficiency Neg Hx    Prior to Admission medications   Medication Sig Start Date End Date Taking? Authorizing Provider  Cyanocobalamin (B-12) 1000 MCG SUBL Place 1 mL under the tongue daily. 01/10/16  Yes Stacey Stanley, Stacey Stager, MD    cyclobenzaprine (FLEXERIL) 10 MG tablet take 1 tablet my mouth at bedtime as needed for muscle spasma 08/22/17  Yes [provider]  DULoxetine (CYMBALTA) 60 MG capsule Take 1 capsule (60 mg total) by mouth daily. 07/27/17  Yes Stacey Stanley, Stacey Stager, MD  hydrochlorothiazide (HYDRODIURIL) 25 MG tablet Take 1 tablet (25 mg total) by mouth daily. Take half Sicilia Killough tablet daily 07/27/17  Yes Stacey Stanley, Stacey Stager, MD  ibuprofen (ADVIL,MOTRIN) 800 MG tablet Take 1 tablet (800 mg total) by mouth every 8 (eight) hours as needed for mild pain. 06/28/17  Yes Stacey Stanley, Stacey Bison, DO  lactulose (CHRONULAC) 10 GM/15ML solution TK 10 TO 15 ML PO Benecio Kluger DAY 08/04/17  Yes [provider]  metFORMIN (GLUCOPHAGE XR) 750 MG 24 hr tablet Take 1 tablet (750 mg total) by mouth daily with breakfast. 04/23/17  Yes Stacey Stanley, Stacey Stager, MD  Multiple Vitamin (MULTI-VITAMINS) TABS Take 1 tablet by mouth daily.    Yes [provider]  NUCYNTA 75 MG tablet Take 75 mg by mouth every 6 (six) hours as needed. for pain 06/28/17  Yes Stacey Salina, MD  ondansetron (ZOFRAN ODT) 4 MG disintegrating tablet Take 1 tablet (4 mg total) by mouth every 6 (six) hours as needed for nausea or vomiting. 06/28/17  Yes Stacey Stanley, Stacey Bison, DO  Semaglutide (OZEMPIC) 0.25 or 0.5 MG/DOSE SOPN Inject 0.5 mg into the skin once Julian Askin week. 07/27/17  Yes Stacey Stanley, Stacey Stager, MD  TRULANCE 3 MG TABS Take 1 tablet by mouth daily. 07/14/17  Yes [provider]  Vitamin D, Ergocalciferol, (DRISDOL) 50000 units CAPS capsule TAKE 1 CAPSULE BY MOUTH ONCE Geselle Cardosa WEEK 08/06/17  Yes Stacey Stanley, Stacey Stager, MD  tiZANidine (ZANAFLEX) 4 MG capsule Take 1 capsule (4 mg total) by mouth at bedtime. Patient not taking: Reported on 07/27/2017 04/23/17   Stacey Sizer, MD    Physical Exam: Vitals:   09/09/17 1226 09/09/17 1354 09/09/17 1543 09/09/17 2037  BP:  106/71  108/70  Pulse:  (!) 101  97  Resp:    18  Temp: 98.4 F (36.9 C)  100 F (37.8 C) 98.1 F (36.7 C)  TempSrc: Oral  Oral  Oral  SpO2:  100%  98%  Weight:      Height:        Constitutional: NAD, calm, comfortable Vitals:   09/09/17 1226 09/09/17 1354 09/09/17 1543 09/09/17 2037  BP:  106/71  108/70  Pulse:  (!) 101  97  Resp:    18  Temp: 98.4 F (36.9 C)  100 F (37.8 C) 98.1 F (36.7 C)  TempSrc: Oral  Oral Oral  SpO2:  100%  98%  Weight:      Height:  Eyes: PERRL, lids and conjunctivae normal ENMT: Mucous membranes are moist. Posterior pharynx clear of any exudate or lesions.Normal dentition.  Neck: normal, supple, no masses, no thyromegaly Respiratory: clear to auscultation bilaterally, no wheezing, no crackles. Normal respiratory effort. No accessory muscle use.  Cardiovascular: Regular rate and rhythm, no murmurs / rubs / gallops. No extremity edema. 2+ pedal pulses. No carotid bruits.  Abdomen: LLQ tenderness, no masses palpated. No hepatosplenomegaly. Bowel sounds positive.  L CVA tenderness Musculoskeletal: no clubbing / cyanosis. No joint deformity upper and lower extremities. Good ROM, no contractures. Normal muscle tone.  Skin: no rashes, lesions, ulcers. No induration Neurologic: CN 2-12 grossly intact. Sensation intact, DTR normal. Strength 5/5 in all 4.  Psychiatric: Normal judgment and insight. Alert and oriented x 3. Normal mood.   Labs on Admission: I have personally reviewed following labs and imaging studies  CBC: Recent Labs  Lab 09/09/17 0753 09/09/17 1441  WBC 9.9 7.6  NEUTROABS 8.0*  --   HGB 14.3 14.2  HCT 40.3 41.5  MCV 85.2 87.0  PLT 256 742   Basic Metabolic Panel: Recent Labs  Lab 09/09/17 0753 09/09/17 1441  NA 133*  --   K 2.7*  --   CL 97*  --   CO2 25  --   GLUCOSE 108*  --   BUN 7  --   CREATININE 0.80 0.89  CALCIUM 9.3  --   MG  --  1.5*   GFR: Estimated Creatinine Clearance: 87.6 mL/min (by C-G formula based on SCr of 0.89 mg/dL). Liver Function Tests: Recent Labs  Lab 09/09/17 0753  AST 14*  ALT 12*  ALKPHOS 55  BILITOT 1.2    PROT 6.8  ALBUMIN 3.8   No results for input(s): LIPASE, AMYLASE in the last 168 hours. No results for input(s): AMMONIA in the last 168 hours. Coagulation Profile: No results for input(s): INR, PROTIME in the last 168 hours. Cardiac Enzymes: No results for input(s): CKTOTAL, CKMB, CKMBINDEX, TROPONINI in the last 168 hours. BNP (last 3 results) No results for input(s): PROBNP in the last 8760 hours. HbA1C: No results for input(s): HGBA1C in the last 72 hours. CBG: Recent Labs  Lab 09/09/17 1532 09/09/17 2038  GLUCAP 142* 107*   Lipid Profile: No results for input(s): CHOL, HDL, LDLCALC, TRIG, CHOLHDL, LDLDIRECT in the last 72 hours. Thyroid Function Tests: No results for input(s): TSH, T4TOTAL, FREET4, T3FREE, THYROIDAB in the last 72 hours. Anemia Panel: No results for input(s): VITAMINB12, FOLATE, FERRITIN, TIBC, IRON, RETICCTPCT in the last 72 hours. Urine analysis:    Component Value Date/Time   COLORURINE AMBER (Linc Renne) 09/09/2017 0654   APPEARANCEUR HAZY (Mishal Probert) 09/09/2017 0654   LABSPEC 1.021 09/09/2017 0654   PHURINE 6.0 09/09/2017 0654   GLUCOSEU NEGATIVE 09/09/2017 0654   HGBUR NEGATIVE 09/09/2017 0654   BILIRUBINUR NEGATIVE 09/09/2017 0654   BILIRUBINUR neg 04/05/2015 1154   KETONESUR NEGATIVE 09/09/2017 0654   PROTEINUR 30 (Ernesteen Mihalic) 09/09/2017 0654   UROBILINOGEN negative 04/05/2015 1154   UROBILINOGEN 0.2 01/23/2015 0237   NITRITE NEGATIVE 09/09/2017 0654   LEUKOCYTESUR NEGATIVE 09/09/2017 0654    Radiological Exams on Admission: Ct Renal Stone Study  Result Date: 09/09/2017 CLINICAL DATA:  Worsening left-sided abdominal pain. EXAM: CT ABDOMEN AND PELVIS WITHOUT CONTRAST TECHNIQUE: Multidetector CT imaging of the abdomen and pelvis was performed following the standard protocol without IV contrast. COMPARISON:  06/28/2017 FINDINGS: Lower chest: Unremarkable Hepatobiliary: No focal abnormality in the liver on this study without intravenous contrast.  Probable sludge in  the gallbladder. No intrahepatic or extrahepatic biliary dilation. Pancreas: No focal mass lesion. No dilatation of the main duct. No intraparenchymal cyst. No peripancreatic edema. Spleen: No splenomegaly. No focal mass lesion. Adrenals/Urinary Tract: No adrenal nodule or mass. 1 mm nonobstructing stone identified interpolar right kidney (29/2). No right ureteral stone. No right hydroureteronephrosis. Genesis Novosad 5 mm nonobstructing stone again identified upper pole left kidney without left ureteral stone or left hydroureteronephrosis. Bladder unremarkable. Stomach/Bowel: Stomach is nondistended. No gastric wall thickening. No evidence of outlet obstruction. Duodenum is normally positioned as is the ligament of Treitz. No small bowel wall thickening. No small bowel dilatation. The terminal ileum is normal. The appendix is not visualized, but there is no edema or inflammation in the region of the cecum. No gross colonic mass. No colonic wall thickening. No substantial diverticular change. Vascular/Lymphatic: No abdominal aortic aneurysm. No abdominal aortic atherosclerotic calcification. There is no gastrohepatic or hepatoduodenal ligament lymphadenopathy. No intraperitoneal or retroperitoneal lymphadenopathy. No pelvic sidewall lymphadenopathy. Reproductive: Uterus surgically absent. There is no right adnexal mass. 6 cm cystic mass left adnexal space on previous study is no longer evident. 21 mm benign appearing cystic focus in the left ovary may be Macari Zalesky dominant follicle or remnant of the cystic process seen previously. Other: No intraperitoneal free fluid. Musculoskeletal: Bone windows reveal no worrisome lytic or sclerotic osseous lesions. IMPRESSION: 1. Bilateral nonobstructing renal stones. 2. Otherwise unremarkable CT scan of the abdomen and pelvis. Electronically Signed   By: Misty Stanley M.D.   On: 09/09/2017 09:57   US Pelvic Complete W Transvaginal And Torsion R/o  Result Date: 09/09/2017 CLINICAL DATA:  Pelvic  pain. EXAM: TRANSABDOMINAL AND TRANSVAGINAL ULTRASOUND OF PELVIS TECHNIQUE: Both transabdominal and transvaginal ultrasound examinations of the pelvis were performed. Transabdominal technique was performed for global imaging of the pelvis including uterus, ovaries, adnexal regions, and pelvic cul-de-sac. It was necessary to proceed with endovaginal exam following the transabdominal exam to visualize the ovaries. COMPARISON:  None FINDINGS: Uterus The uterus is surgically absent. Right ovary Not visualized. Left ovary Measurements: 3 x 2.4 x 2.1 cm.  Contains Darrel Baroni 2.2 cm simple cyst. Other findings No abnormal free fluid. IMPRESSION: 1. There is Novis League 2.2 cm simple cyst in the left ovary. 2. The uterus is surgically absent. 3. The right ovary was not seen. Electronically Signed   By: Dorise Bullion III M.D   On: 09/09/2017 19:27    EKG: Independently reviewed. none  Assessment/Plan Active Problems:   Pelvic pain   SIRS  Left Flank Pain  LLQ and Pelvic Pain: Unclear etiology.  With fever and tachycardia.  [facility] and started on ceftriaxone for concern for pyelo, but UA not representative of this and CT abdomen without evidence of pyelo on imaging.  Does have renal stones, but nonobstructing.  Pain could be MSK?  Also chart hx of endometriosis?  Pain present for several months, initially seen in January it seems.  [facility] also [facility], but etiology for this in setting of her pain is also unclear.  Her white blood cell count is normal.      Pelvic US Follow urine culture Continue ceftriaxone for now with fever and flank pain, but with normal UA, should look for alternative causes of fever and pain as well Follow blood culture Pain control IVF  Rectal Bleeding:  Pt with hemorrhoids, stable H/H.  Not sure if related to above, but this started last week as well.  Continue to  follow H/H.  Will need outpatient follow up with likely colonoscopy.   Hypokalemia: follow mg, replete  HTN: continue  HCTZ  T2DM: ssi  Depression: cymbalta  DVT prophylaxis: lovenox  Code Status: full  Family Communication: none at bedside  Disposition Plan: hopefully home tomorrow if improved pain control  Consults called: none  Admission status: observation   Fayrene Helper MD [facility] Pager [phone]  If 7PM-7AM, please contact night-coverage [email or web address] Password [facility]  09/09/2017, 10:02 PM

## 2017-09-09 NOTE — MAU Provider Note (Signed)
History     Chief Complaint  Patient presents with  . Ovarian Cyst  . Leg Pain  . Rectal Bleeding   42 yo BF hx hysterectomy presents with c/o worsening left sided abdominal pain.  Pt notes rectal bleeding like a period started last weekend and has been on and off since then with small amount  Of blood noted this am. Pt had BM @ 1:30 am. Denies fever. Pain radiates down leg and is worse on left compared to right. Pt had vomiting x1 2 days ago. Denies urinary sx or blood in urine. Hx hemorrhoids. Pt is  Sharp intermittent. Hx notable for left ovarian cyst which has decreased in size on last study done on February ( 2.5 cm). Documented left kidney stone on renal study done in Jan 2019 at which time the incidental finding of left ov cyst was found   Pertinent Gynecological History: Menses: hysterectomy Bleeding: n/a Contraception: status post hysterectomy   Past Medical History:  Diagnosis Date  . Anxiety    no current meds  . Arthritis    left knee pain - otc med prn  . Chronic insomnia   . Depression   . Diabetes mellitus without complication ([place])   . Dysrhythmia    Home with daughter [name]  . Endometriosis   . GERD (gastroesophageal reflux disease)    diet control - no meds  . Headache(784.0)    hx - last one 2 yrs ago - no meds  . Hypertension   . Metabolic syndrome   . Obesity   . Serum calcium elevated   . Sleep disorder, circadian, shift work type   . SVD (spontaneous vaginal delivery)    x 2  . Tachycardia    History - neg cardiac tests-stress related    Past Surgical History:  Procedure Laterality Date  . ABDOMINAL HYSTERECTOMY    . DILATION AND CURETTAGE OF UTERUS    . EYE SURGERY  06/28/13   right laser eye surgery - repair retina  . HYSTEROSCOPY W/D&C N/A 07/19/2013   Procedure: DILATATION AND CURETTAGE /HYSTEROSCOPY;  Surgeon: Marvene Staff, MD;  Location: [facility];  Service: Gynecology;  Laterality: N/A;  . INDUCED ABORTION    . LAPAROSCOPY   1992   endometriosis  . LAPAROSCOPY N/A 07/19/2013   Procedure: LAPAROSCOPY DIAGNOSTIC  with resection of endometriosis;  Surgeon: Marvene Staff, MD;  Location: [facility];  Service: Gynecology;  Laterality: N/A;  . metatarsil      left foot surgery     Family History  Problem Relation Age of Onset  . Diabetes Mother   . Hypertension Mother   . Hypertension Father   . Kidney disease Father   . Heart disease Brother   . Anesthesia problems Neg Hx   . Hypotension Neg Hx   . Malignant hyperthermia Neg Hx   . Pseudochol deficiency Neg Hx     Social History   Tobacco Use  . Smoking status: Current Every Day Smoker    Packs/day: 0.25    Years: 24.00    Pack years: 6.00    Types: Cigarettes    Start date: 11/25/1993  . Smokeless tobacco: Never Used  Substance Use Topics  . Alcohol use: No    Alcohol/week: 0.0 oz  . Drug use: No    Allergies:  Allergies  Allergen Reactions  . Nuvigil [Armodafinil]     Chest pain     Medications Prior to Admission  Medication Sig Dispense Refill  Last Dose  . Cyanocobalamin (B-12) 1000 MCG SUBL Place 1 mL under the tongue daily. 30 each 0 09/08/2017 at Unknown time  . DULoxetine (CYMBALTA) 60 MG capsule Take 1 capsule (60 mg total) by mouth daily. 90 capsule 1 09/08/2017 at Unknown time  . hydrochlorothiazide (HYDRODIURIL) 25 MG tablet Take 1 tablet (25 mg total) by mouth daily. Take half a tablet daily 90 tablet 1 09/08/2017 at Unknown time  . ibuprofen (ADVIL,MOTRIN) 800 MG tablet Take 1 tablet (800 mg total) by mouth every 8 (eight) hours as needed for mild pain. 30 tablet 0 Past Week at Unknown time  . metFORMIN (GLUCOPHAGE XR) 750 MG 24 hr tablet Take 1 tablet (750 mg total) by mouth daily with breakfast. 90 tablet 1 09/08/2017 at Unknown time  . Multiple Vitamin (MULTI-VITAMINS) TABS Take 1 tablet by mouth daily.    09/08/2017 at Unknown time  . NUCYNTA 75 MG tablet Take 75 mg by mouth every 6 (six) hours as needed. for pain  0 09/08/2017  at Unknown time  . ondansetron (ZOFRAN ODT) 4 MG disintegrating tablet Take 1 tablet (4 mg total) by mouth every 6 (six) hours as needed for nausea or vomiting. 20 tablet 0 Past Week at Unknown time  . Semaglutide (OZEMPIC) 0.25 or 0.5 MG/DOSE SOPN Inject 0.5 mg into the skin once a week. 9 mL 1 Past Week at Unknown time  . TRULANCE 3 MG TABS Take 1 tablet by mouth daily.  4 09/08/2017 at Unknown time  . Vitamin D, Ergocalciferol, (DRISDOL) 50000 units CAPS capsule TAKE 1 CAPSULE BY MOUTH ONCE A WEEK 12 capsule 0 Past Week at Unknown time  . tiZANidine (ZANAFLEX) 4 MG capsule Take 1 capsule (4 mg total) by mouth at bedtime. (Patient not taking: Reported on 07/27/2017) 90 capsule 1 More than a month at Unknown time     Physical Exam   Blood pressure 120/71, pulse (!) 134, temperature (!) 101.2 F (38.4 C), temperature source Oral, resp. rate (!) 22, height 5\' 5"  (1.651 m), weight 84.8 kg (187 lb), last menstrual period 07/09/2015, SpO2 99 %.  General appearance: alert, cooperative and mild distress Back: no tenderness to percussion or palpation Lungs: clear to auscultation bilaterally Heart: tachycardia Abdomen: soft nondistended nontender Pelvic: external genitalia normal, uterus surgically absent and bimanual exam. nontender. no palp mass Extremities: no edema, redness or tenderness in the calves or thighs  Rectal : non thrombosed hemorrhoids ED Course  IMP ; left flank pain with known left kidney stone ? Left pyelonephritis Rectal bleeding Hx hysterectomy P) cbc with diff, CMP, urinalysis, ucx. Tylenol. Ct renal stone study. IVF. Ceftriaxone x 1, BC x 2 MDM   Marvene Staff, MD 8:05 AM 09/09/2017

## 2017-09-09 NOTE — MAU Note (Signed)
Dr. Garwin Brothers in to see pt., bi-manual exam done.

## 2017-09-09 NOTE — Progress Notes (Signed)
Pt gave permission to ask questions on nursing admission history with visitors present in the room. Lucius Conn BSN, RN-BC Admissions RN 09/09/2017 4:33 PM

## 2017-09-09 NOTE — Progress Notes (Signed)
Patient continues to have intermittent left flank pain, no chills/sweats No further rectal bleeding  Patient Vitals for the past 24 hrs:  BP Temp Temp src Pulse Resp SpO2 Height Weight  09/09/17 1226 - 98.4 F (36.9 C) Oral - - - - -  09/09/17 1028 112/73 99.6 F (37.6 C) Oral (!) 107 20 98 % - -  09/09/17 1025 (P) 112/73 - - (!) (P) 107 (P) 20 - - -  09/09/17 0953 - 99.5 F (37.5 C) Oral - - - - -  09/09/17 0809 116/83 100.3 F (37.9 C) Oral (!) 122 20 - - -  09/09/17 0640 120/71 (!) 101.2 F (38.4 C) Oral (!) 134 (!) 22 99 % 5\' 5"  (1.651 m) 187 lb (84.8 kg)   A&ox3 nml respirations Abd: soft, nt, nd M/s: +left flank pain, no rt flank pain LE: no edema, nt bilat  CT: bilateral non-obstructing renal stones, uterus absent, no rt adnexal mass, 2cm benign appearing cyst left ovary ?dominant follicle vs resolving prior cyst  Results for orders placed or performed during the hospital encounter of 09/09/17 (from the past 24 hour(s))  Urinalysis, Routine w reflex microscopic     Status: Abnormal   Collection Time: 09/09/17  6:54 AM  Result Value Ref Range   Color, Urine AMBER (A) YELLOW   APPearance HAZY (A) CLEAR   Specific Gravity, Urine 1.021 1.005 - 1.030   pH 6.0 5.0 - 8.0   Glucose, UA NEGATIVE NEGATIVE mg/dL   Hgb urine dipstick NEGATIVE NEGATIVE   Bilirubin Urine NEGATIVE NEGATIVE   Ketones, ur NEGATIVE NEGATIVE mg/dL   Protein, ur 30 (A) NEGATIVE mg/dL   Nitrite NEGATIVE NEGATIVE   Leukocytes, UA NEGATIVE NEGATIVE   RBC / HPF 0-5 0 - 5 RBC/hpf   WBC, UA 0-5 0 - 5 WBC/hpf   Bacteria, UA RARE (A) NONE SEEN   Squamous Epithelial / LPF 0-5 (A) NONE SEEN   Mucus PRESENT   CBC with Differential     Status: Abnormal   Collection Time: 09/09/17  7:53 AM  Result Value Ref Range   WBC 9.9 4.0 - 10.5 K/uL   RBC 4.73 3.87 - 5.11 MIL/uL   Hemoglobin 14.3 12.0 - 15.0 g/dL   HCT 40.3 36.0 - 46.0 %   MCV 85.2 78.0 - 100.0 fL   MCH 30.2 26.0 - 34.0 pg   MCHC 35.5 30.0 - 36.0  g/dL   RDW 12.8 11.5 - 15.5 %   Platelets 256 150 - 400 K/uL   Neutrophils Relative % 82 %   Neutro Abs 8.0 (H) 1.7 - 7.7 K/uL   Lymphocytes Relative 15 %   Lymphs Abs 1.5 0.7 - 4.0 K/uL   Monocytes Relative 3 %   Monocytes Absolute 0.3 0.1 - 1.0 K/uL   Eosinophils Relative 0 %   Eosinophils Absolute 0.0 0.0 - 0.7 K/uL   Basophils Relative 0 %   Basophils Absolute 0.0 0.0 - 0.1 K/uL  Comprehensive metabolic panel     Status: Abnormal   Collection Time: 09/09/17  7:53 AM  Result Value Ref Range   Sodium 133 (L) 135 - 145 mmol/L   Potassium 2.7 (LL) 3.5 - 5.1 mmol/L   Chloride 97 (L) 101 - 111 mmol/L   CO2 25 22 - 32 mmol/L   Glucose, Bld 108 (H) 65 - 99 mg/dL   BUN 7 6 - 20 mg/dL   Creatinine, Ser 0.80 0.44 - 1.00 mg/dL   Calcium 9.3 8.9 -  10.3 mg/dL   Total Protein 6.8 6.5 - 8.1 g/dL   Albumin 3.8 3.5 - 5.0 g/dL   AST 14 (L) 15 - 41 U/L   ALT 12 (L) 14 - 54 U/L   Alkaline Phosphatase 55 38 - 126 U/L   Total Bilirubin 1.2 0.3 - 1.2 mg/dL   GFR calc non Af Amer >60 >60 mL/min   GFR calc Af Amer >60 >60 mL/min   Anion gap 11 5 - 15  Culture, blood (routine x 2)     Status: None (Preliminary result)   Collection Time: 09/09/17  8:40 AM  Result Value Ref Range   Specimen Description      BLOOD LEFT HAND Performed at [facility], [place] [address] [place], [place] [address]    Special Requests      BOTTLES DRAWN AEROBIC AND ANAEROBIC Blood Culture adequate volume Performed at [facility], [address] [place], [place] [address]    Culture PENDING    Report Status PENDING   Culture, blood (routine x 2)     Status: None (Preliminary result)   Collection Time: 09/09/17  8:45 AM  Result Value Ref Range   Specimen Description      BLOOD RIGHT ANTECUBITAL Performed at [facility], [place] [address], [place], [place] [address]    Special Requests      BOTTLES DRAWN AEROBIC AND ANAEROBIC Blood Culture adequate volume Performed at [facility], [address], [place], [place] [address]    Culture PENDING    Report Status PENDING    A/p: 42 y/o with left flank pain and fever 1. Suspect pyelonephritis but also pt with bilat renal stones. Does not appear to be gyn etiology and ovarian cyst is normal size - either resolving prior cyst or new physiologic cyst.  Fever improved after tylenol. 2. Hypokalemia - replacement per hospitalist, requested replacement to begin pt transfer team arrived 3. Bilateral renal stones   I have reveiwed case with Dr. Florene Glen (hospitalist at [facility]) and believe care better for pt at other facility and with medicine service.  He has accepted transfer and bed number given.

## 2017-09-09 NOTE — Telephone Encounter (Signed)
Request for Ozempic received; DENIED -------------------------------- Patient should not be out of Ozempic yet Please contact her and verify how she is using it Dose should be 0.5 mg once a WEEK There are 2 mg in a pen (1.5 ml) so she should not be out for a long time

## 2017-09-09 NOTE — MAU Note (Signed)
Pt presents to MAU c/o left ovary pain, pt reports a hx of a cyst on this ovary that was originally 8cm that reduced down to 2 cm, however pt reports the pain feels like the cyst has grown back to the 8cm. Pt was also diagnosed with a kidney stone on the left side that was non obstructive pt denies medication for the kidney stone and urinary symptoms. Pt also reports a bilateral leg pain that is dull and is on the front and back of her legs. Pt reports rectal bleeding that started last weekend pt reports she is passing penny sized clots from her rectum. Pt has hx of constipation. Pt has not been evaluated for this problem.

## 2017-09-10 DIAGNOSIS — N10 Acute tubulo-interstitial nephritis: Secondary | ICD-10-CM | POA: Diagnosis not present

## 2017-09-10 DIAGNOSIS — K625 Hemorrhage of anus and rectum: Secondary | ICD-10-CM

## 2017-09-10 DIAGNOSIS — A419 Sepsis, unspecified organism: Secondary | ICD-10-CM

## 2017-09-10 DIAGNOSIS — E876 Hypokalemia: Secondary | ICD-10-CM

## 2017-09-10 DIAGNOSIS — R109 Unspecified abdominal pain: Secondary | ICD-10-CM

## 2017-09-10 LAB — CBC
HEMATOCRIT: 37.1 % (ref 36.0–46.0)
Hemoglobin: 12.1 g/dL (ref 12.0–15.0)
MCH: 28.6 pg (ref 26.0–34.0)
MCHC: 32.6 g/dL (ref 30.0–36.0)
MCV: 87.7 fL (ref 78.0–100.0)
PLATELETS: 239 10*3/uL (ref 150–400)
RBC: 4.23 MIL/uL (ref 3.87–5.11)
RDW: 13.1 % (ref 11.5–15.5)
WBC: 6.3 10*3/uL (ref 4.0–10.5)

## 2017-09-10 LAB — GLUCOSE, CAPILLARY: GLUCOSE-CAPILLARY: 143 mg/dL — AB (ref 65–99)

## 2017-09-10 LAB — COMPREHENSIVE METABOLIC PANEL
ALBUMIN: 3.3 g/dL — AB (ref 3.5–5.0)
ALK PHOS: 47 U/L (ref 38–126)
ALT: 19 U/L (ref 14–54)
AST: 19 U/L (ref 15–41)
Anion gap: 8 (ref 5–15)
BILIRUBIN TOTAL: 0.4 mg/dL (ref 0.3–1.2)
BUN: 6 mg/dL (ref 6–20)
CO2: 25 mmol/L (ref 22–32)
CREATININE: 0.76 mg/dL (ref 0.44–1.00)
Calcium: 9 mg/dL (ref 8.9–10.3)
Chloride: 104 mmol/L (ref 101–111)
GFR calc Af Amer: >60 mL/min (ref >60)
GLUCOSE: 106 mg/dL — AB (ref 65–99)
Potassium: 3.7 mmol/L (ref 3.5–5.1)
Sodium: 137 mmol/L (ref 135–145)
TOTAL PROTEIN: 5.9 g/dL — AB (ref 6.5–8.1)

## 2017-09-10 LAB — HIV ANTIBODY (ROUTINE TESTING W REFLEX): HIV Screen 4th Generation wRfx: NONREACTIVE

## 2017-09-10 LAB — MAGNESIUM: Magnesium: 2.2 mg/dL (ref 1.7–2.4)

## 2017-09-10 MED ORDER — ONDANSETRON 4 MG PO TBDP: 4.0000 mg | ORAL_TABLET | Freq: Four times a day (QID) | ORAL | 0 refills | Status: DC | PRN

## 2017-09-10 MED ORDER — LEVOFLOXACIN 750 MG PO TABS: 750.0000 mg | ORAL_TABLET | Freq: Every day | ORAL | 0 refills | Status: AC

## 2017-09-10 NOTE — Discharge Summary (Signed)
Physician Discharge Summary  Stacey Stanley PXT:[id number] DOB: 1975-10-09 DOA: 09/09/2017  PCP: Steele Sizer, MD  Admit date: 09/09/2017 Discharge date: 09/10/2017  Admitted From: Home Disposition:  Home  Recommendations for Outpatient Follow-up:  1. Follow up with PCP in 1 week 2. Please follow up on the following pending results: final blood culture results, spoke with microlab this morning prior to discharge, blood cultures are negative growth to date. Follow up urine culture.   Discharge Condition: Stable CODE STATUS: Full  Diet recommendation: Carb modified diet   Brief/Interim Summary: HPI by Dr. Florene Glen: Stacey Stanley is a 42 y.o. female with medical history significant of endometriosis, anxiety, GERD, multiple other medical problems presenting with left-sided flank pain as well as rectal bleeding and fever. Her symptoms started over about the last few days.  She notes pain on the left flank.  She did not persist with getting bigger and came in due to unbearable pain as well as nausea.  She states that about a week ago she notes the pain started to get bad.  She describes it as being at the left flank, constant, dull.  Nothing makes it better or worse.  She notes is been progressively worse.  She denies any radiation to the pain.  She notes rectal bleeding that started last week as well.  She notes history of hemorrhoids.  She had one episode of vomiting yesterday and nonbloody nonbilious.  She notes fevers, chills.  She denies cough, shortness of breath, chest pain, abdominal pain, diarrhea, numbness, tingling, weakness, dysuria, lightheadedness or dizziness.  ED Course: She was seen at the [facility] hospital where she had labs and physical exam.  Pelvic exam was benign per report.  She is started on ceftriaxone for concern for a UTI.  She had abdominal imaging which was unrevealing for cause for the pain.  Transferred to hospitalist for concern for persistent pain which was not  improving with fever and rectal bleeding as well.  Interim Course: No further rectal bleeding noted; she has a hx of hemorrhoids. Follow up outpatient.  Patient stated her left flank pain is much better. Although her UA was unremarkable and CT renal stone negative, will treat empirically with antibiotics for pyelonephritis due to flank pain and fever. She has been afebrile since admission. No subjective fevers, chills, chest pain, SOB, N/V/D, abdominal pain, dysuria. Discharge plan reviewed with patient which she agreed with. All questions answered.   Discharge Diagnoses:  Principal Problem:   Acute pyelonephritis Active Problems:   Chronic major depressive disorder   Essential (primary) hypertension   Chronic pelvic pain in female   Prediabetes   Pelvic pain   Left flank pain   Sepsis ([place])   Rectal bleed   Hypomagnesemia   Hypokalemia    Discharge Instructions  Discharge Instructions    Call MD for:  difficulty breathing, headache or visual disturbances   Complete by:  As directed    Call MD for:  extreme fatigue   Complete by:  As directed    Call MD for:  hives   Complete by:  As directed    Call MD for:  persistant dizziness or light-headedness   Complete by:  As directed    Call MD for:  persistant nausea and vomiting   Complete by:  As directed    Call MD for:  severe uncontrolled pain   Complete by:  As directed    Call MD for:  temperature >100.4   Complete by:  As directed    Diet Carb Modified   Complete by:  As directed    Discharge instructions   Complete by:  As directed    You were cared for by a hospitalist during your hospital stay. If you have any questions about your discharge medications or the care you received while you were in the hospital after you are discharged, you can call the unit and asked to speak with the hospitalist on call if the hospitalist that took care of you is not available. Once you are discharged, your primary care physician will  handle any further medical issues. Please note that NO REFILLS for any discharge medications will be authorized once you are discharged, as it is imperative that you return to your primary care physician (or establish a relationship with a primary care physician if you do not have one) for your aftercare needs so that they can reassess your need for medications and monitor your lab values.   Increase activity slowly   Complete by:  As directed      Allergies as of 09/10/2017      Reactions   Nuvigil [armodafinil]    Chest pain       Medication List    TAKE these medications   B-12 1000 MCG Subl Place 1 mL under the tongue daily.   cyclobenzaprine 10 MG tablet Commonly known as:  FLEXERIL take 1 tablet my mouth at bedtime as needed for muscle spasma   DULoxetine 60 MG capsule Commonly known as:  CYMBALTA Take 1 capsule (60 mg total) by mouth daily.   hydrochlorothiazide 25 MG tablet Commonly known as:  HYDRODIURIL Take 1 tablet (25 mg total) by mouth daily. Take half a tablet daily   ibuprofen 800 MG tablet Commonly known as:  ADVIL,MOTRIN Take 1 tablet (800 mg total) by mouth every 8 (eight) hours as needed for mild pain.   lactulose 10 GM/15ML solution Commonly known as:  CHRONULAC TK 10 TO 15 ML PO A DAY   levofloxacin 750 MG tablet Commonly known as:  LEVAQUIN Take 1 tablet (750 mg total) by mouth daily for 7 days.   metFORMIN 750 MG 24 hr tablet Commonly known as:  GLUCOPHAGE XR Take 1 tablet (750 mg total) by mouth daily with breakfast.   MULTI-VITAMINS Tabs Take 1 tablet by mouth daily.   NUCYNTA 75 MG tablet Generic drug:  tapentadol HCl Take 75 mg by mouth every 6 (six) hours as needed. for pain   ondansetron 4 MG disintegrating tablet Commonly known as:  ZOFRAN ODT Take 1 tablet (4 mg total) by mouth every 6 (six) hours as needed for nausea or vomiting.   Semaglutide 0.25 or 0.5 MG/DOSE Sopn Commonly known as:  OZEMPIC Inject 0.5 mg into the skin once  a week.   tiZANidine 4 MG capsule Commonly known as:  ZANAFLEX Take 1 capsule (4 mg total) by mouth at bedtime.   TRULANCE 3 MG Tabs Generic drug:  Plecanatide Take 1 tablet by mouth daily.   Vitamin D (Ergocalciferol) 50000 units Caps capsule Commonly known as:  DRISDOL TAKE 1 CAPSULE BY MOUTH ONCE A WEEK      Follow-up Information    Steele Sizer, MD. Schedule an appointment as soon as possible for a visit in 1 week(s).   Specialty:  Family Medicine Contact information: [address] [address] [phone]          Allergies  Allergen Reactions  . Nuvigil [Armodafinil]  Chest pain     Consultations:  None   Procedures/Studies: Ct Renal Stone Study  Result Date: 09/09/2017 CLINICAL DATA:  Worsening left-sided abdominal pain. EXAM: CT ABDOMEN AND PELVIS WITHOUT CONTRAST TECHNIQUE: Multidetector CT imaging of the abdomen and pelvis was performed following the standard protocol without IV contrast. COMPARISON:  06/28/2017 FINDINGS: Lower chest: Unremarkable Hepatobiliary: No focal abnormality in the liver on this study without intravenous contrast. Probable sludge in the gallbladder. No intrahepatic or extrahepatic biliary dilation. Pancreas: No focal mass lesion. No dilatation of the main duct. No intraparenchymal cyst. No peripancreatic edema. Spleen: No splenomegaly. No focal mass lesion. Adrenals/Urinary Tract: No adrenal nodule or mass. 1 mm nonobstructing stone identified interpolar right kidney (29/2). No right ureteral stone. No right hydroureteronephrosis. A 5 mm nonobstructing stone again identified upper pole left kidney without left ureteral stone or left hydroureteronephrosis. Bladder unremarkable. Stomach/Bowel: Stomach is nondistended. No gastric wall thickening. No evidence of outlet obstruction. Duodenum is normally positioned as is the ligament of Treitz. No small bowel wall thickening. No small bowel dilatation. The terminal  ileum is normal. The appendix is not visualized, but there is no edema or inflammation in the region of the cecum. No gross colonic mass. No colonic wall thickening. No substantial diverticular change. Vascular/Lymphatic: No abdominal aortic aneurysm. No abdominal aortic atherosclerotic calcification. There is no gastrohepatic or hepatoduodenal ligament lymphadenopathy. No intraperitoneal or retroperitoneal lymphadenopathy. No pelvic sidewall lymphadenopathy. Reproductive: Uterus surgically absent. There is no right adnexal mass. 6 cm cystic mass left adnexal space on previous study is no longer evident. 21 mm benign appearing cystic focus in the left ovary may be a dominant follicle or remnant of the cystic process seen previously. Other: No intraperitoneal free fluid. Musculoskeletal: Bone windows reveal no worrisome lytic or sclerotic osseous lesions. IMPRESSION: 1. Bilateral nonobstructing renal stones. 2. Otherwise unremarkable CT scan of the abdomen and pelvis. Electronically Signed   By: Misty Stanley M.D.   On: 09/09/2017 09:57   US Pelvic Complete W Transvaginal And Torsion R/o  Result Date: 09/09/2017 CLINICAL DATA:  Pelvic pain. EXAM: TRANSABDOMINAL AND TRANSVAGINAL ULTRASOUND OF PELVIS TECHNIQUE: Both transabdominal and transvaginal ultrasound examinations of the pelvis were performed. Transabdominal technique was performed for global imaging of the pelvis including uterus, ovaries, adnexal regions, and pelvic cul-de-sac. It was necessary to proceed with endovaginal exam following the transabdominal exam to visualize the ovaries. COMPARISON:  None FINDINGS: Uterus The uterus is surgically absent. Right ovary Not visualized. Left ovary Measurements: 3 x 2.4 x 2.1 cm.  Contains a 2.2 cm simple cyst. Other findings No abnormal free fluid. IMPRESSION: 1. There is a 2.2 cm simple cyst in the left ovary. 2. The uterus is surgically absent. 3. The right ovary was not seen. Electronically Signed   By: Dorise Bullion III M.D   On: 09/09/2017 19:27       Discharge Exam: Vitals:   09/09/17 2037 09/10/17 0447  BP: 108/70 108/79  Pulse: 97 (!) 101  Resp: 18 16  Temp: 98.1 F (36.7 C) 98 F (36.7 C)  SpO2: 98% 98%    General: Pt is alert, awake, not in acute distress Cardiovascular: RRR, S1/S2 +, no rubs, no gallops Respiratory: CTA bilaterally, no wheezing, no rhonchi Abdominal: Soft, NT, ND, bowel sounds + Extremities: no edema, no cyanosis    The results of significant diagnostics from this hospitalization (including imaging, microbiology, ancillary and laboratory) are listed below for reference.     Microbiology: Recent Results (from the  past 240 hour(s))  Culture, blood (routine x 2)     Status: None (Preliminary result)   Collection Time: 09/09/17  8:40 AM  Result Value Ref Range Status   Specimen Description   Final    BLOOD LEFT HAND Performed at [facility], [place] [address] [place], [place] [address]    Special Requests   Final    BOTTLES DRAWN AEROBIC AND ANAEROBIC Blood Culture adequate volume Performed at [facility], [address], [place], [place] [address]    Culture PENDING  Incomplete   Report Status PENDING  Incomplete  Culture, blood (routine x 2)     Status: None (Preliminary result)   Collection Time: 09/09/17  8:45 AM  Result Value Ref Range Status   Specimen Description   Final    BLOOD RIGHT ANTECUBITAL Performed at [facility], [place] [address] [place], [place] [address]    Special Requests   Final    BOTTLES DRAWN AEROBIC AND ANAEROBIC Blood Culture adequate volume Performed at [facility], [address] [place], [place] [address]    Culture PENDING  Incomplete   Report Status PENDING  Incomplete     Labs: BNP (last 3 results) No results for input(s): BNP in the last 8760 hours. Basic Metabolic Panel: Recent Labs  Lab 09/09/17 0753 09/09/17 1441 09/10/17 0431  NA 133*  --  137  K 2.7*  --  3.7  CL 97*  --   104  CO2 25  --  25  GLUCOSE 108*  --  106*  BUN 7  --  6  CREATININE 0.80 0.89 0.76  CALCIUM 9.3  --  9.0  MG  --  1.5* 2.2   Liver Function Tests: Recent Labs  Lab 09/09/17 0753 09/10/17 0431  AST 14* 19  ALT 12* 19  ALKPHOS 55 47  BILITOT 1.2 0.4  PROT 6.8 5.9*  ALBUMIN 3.8 3.3*   No results for input(s): LIPASE, AMYLASE in the last 168 hours. No results for input(s): AMMONIA in the last 168 hours. CBC: Recent Labs  Lab 09/09/17 0753 09/09/17 1441 09/10/17 0431  WBC 9.9 7.6 6.3  NEUTROABS 8.0*  --   --   HGB 14.3 14.2 12.1  HCT 40.3 41.5 37.1  MCV 85.2 87.0 87.7  PLT 256 240 239   Cardiac Enzymes: No results for input(s): CKTOTAL, CKMB, CKMBINDEX, TROPONINI in the last 168 hours. BNP: Invalid input(s): POCBNP CBG: Recent Labs  Lab 09/09/17 1532 09/09/17 2038 09/10/17 0755  GLUCAP 142* 107* 143*   D-Dimer No results for input(s): DDIMER in the last 72 hours. Hgb A1c No results for input(s): HGBA1C in the last 72 hours. Lipid Profile No results for input(s): CHOL, HDL, LDLCALC, TRIG, CHOLHDL, LDLDIRECT in the last 72 hours. Thyroid function studies No results for input(s): TSH, T4TOTAL, T3FREE, THYROIDAB in the last 72 hours.  Invalid input(s): FREET3 Anemia work up No results for input(s): VITAMINB12, FOLATE, FERRITIN, TIBC, IRON, RETICCTPCT in the last 72 hours. Urinalysis    Component Value Date/Time   COLORURINE AMBER (A) 09/09/2017 0654   APPEARANCEUR HAZY (A) 09/09/2017 0654   LABSPEC 1.021 09/09/2017 0654   PHURINE 6.0 09/09/2017 0654   GLUCOSEU NEGATIVE 09/09/2017 0654   HGBUR NEGATIVE 09/09/2017 0654   BILIRUBINUR NEGATIVE 09/09/2017 0654   BILIRUBINUR neg 04/05/2015 1154   KETONESUR NEGATIVE 09/09/2017 0654   PROTEINUR 30 (A) 09/09/2017 0654   UROBILINOGEN negative 04/05/2015 1154   UROBILINOGEN 0.2 01/23/2015 0237  NITRITE NEGATIVE 09/09/2017 0654   LEUKOCYTESUR NEGATIVE 09/09/2017 0654   Sepsis Labs Invalid input(s):  PROCALCITONIN,  WBC,  LACTICIDVEN Microbiology Recent Results (from the past 240 hour(s))  Culture, blood (routine x 2)     Status: None (Preliminary result)   Collection Time: 09/09/17  8:40 AM  Result Value Ref Range Status   Specimen Description   Final    BLOOD LEFT HAND Performed at [facility], [place] [address] [place], [place] [address]    Special Requests   Final    BOTTLES DRAWN AEROBIC AND ANAEROBIC Blood Culture adequate volume Performed at [facility], [address], [place], [place] [address]    Culture PENDING  Incomplete   Report Status PENDING  Incomplete  Culture, blood (routine x 2)     Status: None (Preliminary result)   Collection Time: 09/09/17  8:45 AM  Result Value Ref Range Status   Specimen Description   Final    BLOOD RIGHT ANTECUBITAL Performed at [facility], [place] [address] [place], [place] [address]    Special Requests   Final    BOTTLES DRAWN AEROBIC AND ANAEROBIC Blood Culture adequate volume Performed at [facility], [address], [place], [place] [address]    Culture PENDING  Incomplete   Report Status PENDING  Incomplete     Patient was seen and examined on the day of discharge and was found to be in stable condition. Time coordinating discharge: 25 minutes including assessment and coordination of care, as well as examination of the patient.   SIGNED:  Dessa Phi, DO [facility] Pager [phone]  If 7PM-7AM, please contact night-coverage [email or web address] Password [facility] 09/10/2017, 10:21 AM

## 2017-09-10 NOTE — Telephone Encounter (Signed)
Patient notified. She is in the hospital right now and she will double check to see if she has enough to last and call us back once she has been discharged from hospital.

## 2017-09-10 NOTE — Progress Notes (Signed)
Pt discharged to home, instruction reviewed, acknowledged understanding. Family member at bedside prepared to take pt home. SRP, RN

## 2017-09-11 LAB — URINE CULTURE: Culture: <10000 — AB

## 2017-09-13 ENCOUNTER — Telehealth: Payer: Self-pay

## 2017-09-13 NOTE — Telephone Encounter (Signed)
I sent a message to the front desk again to schedule her an appointment

## 2017-09-13 NOTE — Telephone Encounter (Signed)
Pt returned call to make follow up appt, ans she also want to talk about some test results.  cb is [phone]

## 2017-09-13 NOTE — Telephone Encounter (Signed)
TOC #1. Called pt to f/u after d/c from [facility] on 09/10/17. At the time of this call, it does not appear this pt has scheduled a f/u appt with Dr. Ancil Boozer. Discharge planning includes the following:  - Start Levaquin (acute pyelonephritis) - Continue with carb modified diet - f/u with Dr. Ancil Boozer within 1 week after discharge (not yet scheduled) As part of the [facility] f/u call, I am also wanting to discuss/review the above information with the pt to ensure all of the above have been taken care of and that she has no questions or concerns re: her care/discharge summary. LVM requesting returned call.

## 2017-09-13 NOTE — Telephone Encounter (Signed)
Please schedule patient for follow up.

## 2017-09-13 NOTE — Telephone Encounter (Signed)
Called and spoke with patient about the lab results she wanted to follow up on. Blood cultures were negative to date. Urine cultures have not been annotated yet. While talking with patient she also had concerns about having intermittent fevers. She continues to take Levaquin once daily and unsure why she is having fever.

## 2017-09-14 ENCOUNTER — Telehealth: Payer: Self-pay

## 2017-09-14 LAB — CULTURE, BLOOD (ROUTINE X 2)
CULTURE: NO GROWTH
CULTURE: NO GROWTH
SPECIAL REQUESTS: ADEQUATE
Special Requests: ADEQUATE

## 2017-09-14 NOTE — Telephone Encounter (Signed)
Pt is scheduled for 09/15/17

## 2017-09-14 NOTE — Telephone Encounter (Signed)
TOC #2. Called pt to f/u after d/c from [facility] on 09/10/17. Also wanted to confirm f/u appt with Suezanne Cheshire, FNP on 09/15/17 @ 2:00pm. Discharge planning includes the following:  - Start Levaquin (acute pyelonephritis) - Continue with carb modified diet - f/u with Dr. Ancil Boozer within 1 week after discharge As part of the [facility] f/u call, I am also wanting to discuss/review the above information with the pt to ensure all of the above have been taken care of and that she has no questions or concerns re: her care/discharge summary. LVM requesting returned call.

## 2017-09-15 ENCOUNTER — Encounter: Payer: Self-pay | Admitting: Nurse Practitioner

## 2017-09-15 ENCOUNTER — Ambulatory Visit: Payer: Federal, State, Local not specified - PPO | Admitting: Nurse Practitioner

## 2017-09-15 VITALS — BP 110/76 | HR 102 | Temp 98.5°F | Resp 16 | Ht 65.0 in | Wt 187.0 lb

## 2017-09-15 DIAGNOSIS — Z09 Encounter for follow-up examination after completed treatment for conditions other than malignant neoplasm: Secondary | ICD-10-CM

## 2017-09-15 DIAGNOSIS — N10 Acute tubulo-interstitial nephritis: Secondary | ICD-10-CM | POA: Diagnosis not present

## 2017-09-15 DIAGNOSIS — R109 Unspecified abdominal pain: Secondary | ICD-10-CM

## 2017-09-15 DIAGNOSIS — E876 Hypokalemia: Secondary | ICD-10-CM | POA: Diagnosis not present

## 2017-09-15 MED ORDER — KETOROLAC TROMETHAMINE 10 MG PO TABS: 10.0000 mg | ORAL_TABLET | Freq: Four times a day (QID) | ORAL | 0 refills | Status: DC | PRN

## 2017-09-15 NOTE — Progress Notes (Addendum)
Name: Stacey Stanley   MRN: [id number]    DOB: August 10, 1975   Date:09/15/2017       Progress Note  Subjective  Chief Complaint  Chief Complaint  Patient presents with  . Hospitalization Follow-up    HPI  Patient is here for follow-up after ER visit on 09/09/2017 for kidney stones and acute pyelonephritis.  Patient was given Levaquin will finish dose on Friday.  Has not seen stone in urine since still having left sided flank pain.  No blood in urine visualized.  Patient taking Nucynta for pain was prescribed by OB/GYN for pain. During hospitalization noted to be hypokalemic and hypomag- given IV and brought back to normal levels. Patient states overall feels better but pain is coming back. She has more energy but pain is constant on left flank. Patient drinks at least 64 ounces of water a day. [place] on 4/11 shows cyst on left ovary and bilateral non-obstructing stones. Patient sts had fevers and chills day after discharge but resolved. Pt endorses intermittent nausea relieved with zofran. Patient states has taken nucynta and ibuprofen for pain- nucynta resolves pain for about 8 hours; ibuprofen has no relief; has tried Toradol and tramadol before without relief. 3 months of left flank pain initially cyst was 8 cm now 2 cm.    Patient Active Problem List   Diagnosis Date Noted  . Left flank pain 09/10/2017  . Acute pyelonephritis 09/10/2017  . Sepsis ([place]) 09/10/2017  . Rectal bleed 09/10/2017  . Hypomagnesemia 09/10/2017  . Hypokalemia 09/10/2017  . Pelvic pain 09/09/2017  . Mass of left ovary 06/28/2017  . Kidney stone on left side 06/28/2017  . Cervical radiculitis 03/08/2017  . Tobacco dependence 10/28/2016  . Vitamin B12 deficiency 08/19/2016  . Prediabetes 08/19/2016  . Excessive bleeding in premenopausal period 08/19/2015  . Right thyroid nodule 07/31/2015  . Chronic pelvic pain in female 04/05/2015  . GERD (gastroesophageal reflux disease) 04/05/2015  . Chronic major depressive  disorder 11/25/2014  . Insomnia, persistent 11/25/2014  . Endometriosis 11/25/2014  . Essential (primary) hypertension 11/25/2014  . Dysmetabolic syndrome 27/10/2374  . Obesity (BMI 30-39.9) 11/25/2014    Past Medical History:  Diagnosis Date  . Anxiety    no current meds  . Arthritis    left knee pain - otc med prn  . Chronic insomnia   . Depression   . Diabetes mellitus without complication ([place])   . Dysrhythmia    Home with daughter [name]  . Endometriosis   . GERD (gastroesophageal reflux disease)    diet control - no meds  . Headache(784.0)    hx - last one 2 yrs ago - no meds  . Hypertension   . Metabolic syndrome   . Obesity   . Serum calcium elevated   . Sleep disorder, circadian, shift work type   . SVD (spontaneous vaginal delivery)    x 2  . Tachycardia    History - neg cardiac tests-stress related    Past Surgical History:  Procedure Laterality Date  . ABDOMINAL HYSTERECTOMY    . DILATION AND CURETTAGE OF UTERUS    . EYE SURGERY  06/28/13   right laser eye surgery - repair retina  . HYSTEROSCOPY W/D&C N/A 07/19/2013   Procedure: DILATATION AND CURETTAGE /HYSTEROSCOPY;  Surgeon: Marvene Staff, MD;  Location: [facility];  Service: Gynecology;  Laterality: N/A;  . INDUCED ABORTION    . LAPAROSCOPY  1992   endometriosis  . LAPAROSCOPY N/A 07/19/2013  Procedure: LAPAROSCOPY DIAGNOSTIC  with resection of endometriosis;  Surgeon: Marvene Staff, MD;  Location: [facility];  Service: Gynecology;  Laterality: N/A;  . metatarsil      left foot surgery     Social History   Tobacco Use  . Smoking status: Current Every Day Smoker    Packs/day: 0.25    Years: 24.00    Pack years: 6.00    Types: Cigarettes    Start date: 11/25/1993  . Smokeless tobacco: Never Used  Substance Use Topics  . Alcohol use: No    Alcohol/week: 0.0 oz     Current Outpatient Medications:  .  Cyanocobalamin (B-12) 1000 MCG SUBL, Place 1 mL under the tongue daily.,  Disp: 30 each, Rfl: 0 .  DULoxetine (CYMBALTA) 60 MG capsule, Take 1 capsule (60 mg total) by mouth daily., Disp: 90 capsule, Rfl: 1 .  hydrochlorothiazide (HYDRODIURIL) 25 MG tablet, Take 1 tablet (25 mg total) by mouth daily. Take half a tablet daily, Disp: 90 tablet, Rfl: 1 .  ibuprofen (ADVIL,MOTRIN) 800 MG tablet, Take 1 tablet (800 mg total) by mouth every 8 (eight) hours as needed for mild pain., Disp: 30 tablet, Rfl: 0 .  lactulose (CHRONULAC) 10 GM/15ML solution, TK 10 TO 15 ML PO A DAY, Disp: , Rfl: 3 .  levofloxacin (LEVAQUIN) 750 MG tablet, Take 1 tablet (750 mg total) by mouth daily for 7 days., Disp: 7 tablet, Rfl: 0 .  metFORMIN (GLUCOPHAGE XR) 750 MG 24 hr tablet, Take 1 tablet (750 mg total) by mouth daily with breakfast., Disp: 90 tablet, Rfl: 1 .  Multiple Vitamin (MULTI-VITAMINS) TABS, Take 1 tablet by mouth daily. , Disp: , Rfl:  .  NUCYNTA 75 MG tablet, Take 75 mg by mouth every 6 (six) hours as needed. for pain, Disp: , Rfl: 0 .  ondansetron (ZOFRAN ODT) 4 MG disintegrating tablet, Take 1 tablet (4 mg total) by mouth every 6 (six) hours as needed for nausea or vomiting., Disp: 20 tablet, Rfl: 0 .  Semaglutide (OZEMPIC) 0.25 or 0.5 MG/DOSE SOPN, Inject 0.5 mg into the skin once a week., Disp: 9 mL, Rfl: 1 .  TRULANCE 3 MG TABS, Take 1 tablet by mouth daily., Disp: , Rfl: 4 .  Vitamin D, Ergocalciferol, (DRISDOL) 50000 units CAPS capsule, TAKE 1 CAPSULE BY MOUTH ONCE A WEEK, Disp: 12 capsule, Rfl: 0 .  cyclobenzaprine (FLEXERIL) 10 MG tablet, take 1 tablet my mouth at bedtime as needed for muscle spasma, Disp: , Rfl: 0 .  tiZANidine (ZANAFLEX) 4 MG capsule, Take 1 capsule (4 mg total) by mouth at bedtime. (Patient not taking: Reported on 07/27/2017), Disp: 90 capsule, Rfl: 1  Allergies  Allergen Reactions  . Nuvigil [Armodafinil]     Chest pain     ROS  No other specific complaints in a complete review of systems (except as listed in HPI above).  Objective  Vitals:    09/15/17 1412  BP: 110/76  Pulse: (!) 102  Resp: 16  Temp: 98.5 F (36.9 C)  TempSrc: Oral  SpO2: 96%  Weight: 187 lb (84.8 kg)  Height: 5\' 5"  (1.651 m)    Body mass index is 31.12 kg/m.  Nursing Note and Vital Signs reviewed.  Physical Exam  Constitutional: Patient appears well-developed and well-nourished. No distress.  Cardiovascular: Normal rate, regular rhythm, S1/S2 present. No murmur or rub heard.  Pulmonary/Chest: Effort normal and breath sounds clear. No respiratory distress or retractions. Abdominal: Soft and non-tender, bowel sounds present,  no CVA tenderness Psychiatric: Patient has a normal mood and affect. behavior is normal. Judgment and thought content normal.  No results found for this or any previous visit (from the past 72 hour(s)).  Assessment & Plan  Patient presents to clinic for hospital follow-up.  Was admitted for acute Pyelonephritis with acute on chronic left flank pain.  Patient has endometriosis and left ovarian cyst and during hospitalization noted to have bilateral kidney stones.  Patient discharged on Levaquin course completed on Friday she has been taking it as prescribed.  Patient states she has been drinking lots of fluids has not seen kidney stone pass yet.  States overall feels better but pain is still present in left flank.  Has been self treating with ibuprofen had a prescription for nucynta in the past by OB/GYN for endometrial pain states that it was believed the pain.  Appropriate policy in the clinic unable to re-prescribe will prescribe Toradol for pain and refer to pain clinic for further management of chronic left flank pain.  Will follow up on CMP and mag to ensure levels continue to stabilize. 1. Hospital discharge follow-up Medications reconciled TOC phone call on 4/15 and 4/16  2. Hypomagnesemia  - Magnesium  3. Hypokalemia  - COMPLETE METABOLIC PANEL WITH GFR  4. Acute pyelonephritis  - COMPLETE METABOLIC PANEL WITH  GFR  5. Left flank pain - COMPLETE METABOLIC PANEL WITH GFR - ketorolac (TORADOL) 10 MG tablet; Take 1 tablet (10 mg total) by mouth every 6 (six) hours as needed.  Dispense: 20 tablet; Refill: 0 - Ambulatory referral to Pain Clinic    -Red flags and when to present for emergency care or RTC including fever >101.34F, chest pain, shortness of breath, new/worsening/un-resolving symptoms, heavy/unusual bleeding, flank pain not improving, unable to urinate  reviewed with patient at time of visit. Follow up and care instructions discussed and provided in AVS.  ---------------------------------------------- I have reviewed this encounter including the documentation in this note and/or discussed this patient with the provider, Suezanne Cheshire DNP AGNP-C. I am certifying that I agree with the content of this note as supervising physician. Enid Derry, [facility] 09/22/2017, 7:55 AM

## 2017-09-15 NOTE — Patient Instructions (Signed)
-  will redraw labs today  If you have not heard anything from my staff in a week about any orders/referrals/studies from today, please contact us here to follow-up [phone] - Continue OTC pain management and zofran for nausea - Continue drinking lots of fluids - Monitor for fever/chill- uncontrolled with acetaminophen heavy/unusual bleeding, flank pain not improving, unable to urinate and seek medical attention immediately   Ovarian Cyst An ovarian cyst is a fluid-filled sac on an ovary. The ovaries are organs that make eggs in women. Most ovarian cysts go away on their own and are not cancerous (are benign). Some cysts need treatment. Follow these instructions at home:  Take over-the-counter and prescription medicines only as told by your doctor.  Do not drive or use heavy machinery while taking prescription pain medicine.  Get pelvic exams and Pap tests as often as told by your doctor.  Return to your normal activities as told by your doctor. Ask your doctor what activities are safe for you.  Do not use any products that contain nicotine or tobacco, such as cigarettes and e-cigarettes. If you need help quitting, ask your doctor.  Keep all follow-up visits as told by your doctor. This is important. Contact a doctor if:  Your periods are: ? Late. ? Irregular. ? Painful.  Your periods stop.  You have pelvic pain that does not go away.  You have pressure on your bladder.  You have trouble making your bladder empty when you pee (urinate).  You have pain during sex.  You have any of the following in your belly (abdomen): ? A feeling of fullness. ? Pressure. ? Discomfort. ? Pain that does not go away. ? Swelling.  You feel sick most of the time.  You have trouble pooping (have constipation).  You are not as hungry as usual (you lose your appetite).  You get very bad acne.  You start to have more hair on your body and face.  You are gaining weight or losing  weight without changing your exercise and eating habits.  You think you may be pregnant. Get help right away if:  You have belly pain that is very bad or gets worse.  You cannot eat or drink without throwing up (vomiting).  You suddenly get a fever.  Your period is a lot heavier than usual. This information is not intended to replace advice given to you by your health care provider. Make sure you discuss any questions you have with your health care provider. Document Released: 11/04/2007 Document Revised: 12/06/2015 Document Reviewed: 10/20/2015 Elsevier Interactive Patient Education  [employer].

## 2017-09-16 LAB — COMPLETE METABOLIC PANEL WITH GFR
AG Ratio: 1.6 (calc) (ref 1.0–2.5)
ALBUMIN MSPROF: 3.7 g/dL (ref 3.6–5.1)
ALKALINE PHOSPHATASE (APISO): 51 U/L (ref 33–115)
ALT: 11 U/L (ref 6–29)
AST: 12 U/L (ref 10–30)
BUN: 12 mg/dL (ref 7–25)
CALCIUM: 9.7 mg/dL (ref 8.6–10.2)
CO2: 28 mmol/L (ref 20–32)
Chloride: 105 mmol/L (ref 98–110)
Creat: 0.9 mg/dL (ref 0.50–1.10)
GFR, EST NON AFRICAN AMERICAN: 78 mL/min/{1.73_m2} (ref >=60)
GFR, Est African American: 91 mL/min/{1.73_m2} (ref >=60)
GLOBULIN: 2.3 g/dL (ref 1.9–3.7)
Glucose, Bld: 94 mg/dL (ref 65–99)
Potassium: 3.4 mmol/L — ABNORMAL LOW (ref 3.5–5.3)
SODIUM: 140 mmol/L (ref 135–146)
Total Bilirubin: 0.4 mg/dL (ref 0.2–1.2)
Total Protein: 6 g/dL — ABNORMAL LOW (ref 6.1–8.1)

## 2017-09-16 LAB — MAGNESIUM: Magnesium: 1.5 mg/dL (ref 1.5–2.5)

## 2017-09-22 ENCOUNTER — Other Ambulatory Visit: Payer: Self-pay | Admitting: Family Medicine

## 2017-09-22 DIAGNOSIS — E8881 Metabolic syndrome: Secondary | ICD-10-CM

## 2017-09-22 DIAGNOSIS — E669 Obesity, unspecified: Secondary | ICD-10-CM

## 2017-09-22 MED ORDER — SEMAGLUTIDE(0.25 OR 0.5MG/DOS) 2 MG/1.5ML ~~LOC~~ SOPN: 0.5000 mg | PEN_INJECTOR | SUBCUTANEOUS | 0 refills | Status: DC

## 2017-09-22 NOTE — Telephone Encounter (Signed)
Copied from [place] [id number]. Topic: Quick Communication - See Telephone Encounter >> Sep 22, 2017 10:11 AM Conception Chancy, NT wrote: CRM for notification. See Telephone encounter for: 09/22/17.  Patient is needing a refill on Semaglutide (OZEMPIC) 0.25 or 0.5 MG/DOSE SOPN. Please advise  [facility] [place], [place] - [address] [place] [address] Phone: [phone] Fax: [phone]

## 2017-09-22 NOTE — Telephone Encounter (Signed)
Refill request for general medication: Ozempic   Last office visit: 09/15/2017  Last physical exam: 09/17/2016  Follow-ups on file. 10/26/2017

## 2017-09-24 DIAGNOSIS — N200 Calculus of kidney: Secondary | ICD-10-CM | POA: Diagnosis not present

## 2017-10-07 DIAGNOSIS — K5904 Chronic idiopathic constipation: Secondary | ICD-10-CM | POA: Diagnosis not present

## 2017-10-07 DIAGNOSIS — Z1211 Encounter for screening for malignant neoplasm of colon: Secondary | ICD-10-CM | POA: Diagnosis not present

## 2017-10-07 DIAGNOSIS — K625 Hemorrhage of anus and rectum: Secondary | ICD-10-CM | POA: Diagnosis not present

## 2017-10-13 DIAGNOSIS — K648 Other hemorrhoids: Secondary | ICD-10-CM | POA: Diagnosis not present

## 2017-10-13 DIAGNOSIS — K5904 Chronic idiopathic constipation: Secondary | ICD-10-CM | POA: Insufficient documentation

## 2017-10-26 ENCOUNTER — Ambulatory Visit: Payer: Federal, State, Local not specified - PPO | Admitting: Family Medicine

## 2017-10-26 ENCOUNTER — Encounter: Payer: Self-pay | Admitting: Family Medicine

## 2017-10-26 VITALS — BP 110/68 | HR 103 | Temp 98.4°F | Resp 16 | Ht 66.54 in | Wt 178.8 lb

## 2017-10-26 DIAGNOSIS — G4700 Insomnia, unspecified: Secondary | ICD-10-CM | POA: Diagnosis not present

## 2017-10-26 DIAGNOSIS — E559 Vitamin D deficiency, unspecified: Secondary | ICD-10-CM

## 2017-10-26 DIAGNOSIS — M5412 Radiculopathy, cervical region: Secondary | ICD-10-CM

## 2017-10-26 DIAGNOSIS — F320 Major depressive disorder, single episode, mild: Secondary | ICD-10-CM | POA: Diagnosis not present

## 2017-10-26 DIAGNOSIS — E669 Obesity, unspecified: Secondary | ICD-10-CM

## 2017-10-26 DIAGNOSIS — E8881 Metabolic syndrome: Secondary | ICD-10-CM

## 2017-10-26 DIAGNOSIS — F17210 Nicotine dependence, cigarettes, uncomplicated: Secondary | ICD-10-CM

## 2017-10-26 DIAGNOSIS — R7303 Prediabetes: Secondary | ICD-10-CM | POA: Diagnosis not present

## 2017-10-26 DIAGNOSIS — D473 Essential (hemorrhagic) thrombocythemia: Secondary | ICD-10-CM | POA: Diagnosis not present

## 2017-10-26 DIAGNOSIS — D75839 Thrombocytosis, unspecified: Secondary | ICD-10-CM | POA: Insufficient documentation

## 2017-10-26 DIAGNOSIS — Z01419 Encounter for gynecological examination (general) (routine) without abnormal findings: Secondary | ICD-10-CM

## 2017-10-26 DIAGNOSIS — R7989 Other specified abnormal findings of blood chemistry: Secondary | ICD-10-CM

## 2017-10-26 DIAGNOSIS — I10 Essential (primary) hypertension: Secondary | ICD-10-CM

## 2017-10-26 MED ORDER — VITAMIN D (ERGOCALCIFEROL) 1.25 MG (50000 UNIT) PO CAPS: 50000.0000 [IU] | ORAL_CAPSULE | ORAL | 0 refills | Status: DC

## 2017-10-26 MED ORDER — METFORMIN HCL ER 750 MG PO TB24: 750.0000 mg | ORAL_TABLET | Freq: Every day | ORAL | 1 refills | Status: DC

## 2017-10-26 MED ORDER — TIZANIDINE HCL 4 MG PO CAPS: 4.0000 mg | ORAL_CAPSULE | Freq: Every day | ORAL | 1 refills | Status: DC

## 2017-10-26 MED ORDER — SEMAGLUTIDE (1 MG/DOSE) 2 MG/1.5ML ~~LOC~~ SOPN: 1.0000 mg | PEN_INJECTOR | SUBCUTANEOUS | 1 refills | Status: DC

## 2017-10-26 NOTE — Progress Notes (Signed)
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 Ergocalciferol, (DRISDOL) 50000 units CAPS capsule; Take 1 capsule (50,000 Units total) by mouth once a week.  Dispense: 12 capsule; Refill: 0

## 2017-10-26 NOTE — Patient Instructions (Signed)
Preventive Care 40-64 Years, Female Preventive care refers to lifestyle choices and visits with your health care provider that can promote health and wellness. What does preventive care include?  A yearly physical exam. This is also called an annual well check.  Dental exams once or twice a year.  Routine eye exams. Ask your health care provider how often you should have your eyes checked.  Personal lifestyle choices, including: ? Daily care of your teeth and gums. ? Regular physical activity. ? Eating a healthy diet. ? Avoiding tobacco and drug use. ? Limiting alcohol use. ? Practicing safe sex. ? Taking low-dose aspirin daily starting at age 58. ? Taking vitamin and mineral supplements as recommended by your health care provider. What happens during an annual well check? The services and screenings done by your health care provider during your annual well check will depend on your age, overall health, lifestyle risk factors, and family history of disease. Counseling Your health care provider may ask you questions about your:  Alcohol use.  Tobacco use.  Drug use.  Emotional well-being.  Home and relationship well-being.  Sexual activity.  Eating habits.  Work and work Statistician.  Method of birth control.  Menstrual cycle.  Pregnancy history.  Screening You may have the following tests or measurements:  Height, weight, and BMI.  Blood pressure.  Lipid and cholesterol levels. These may be checked every 5 years, or more frequently if you are over 81 years old.  Skin check.  Lung cancer screening. You may have this screening every year starting at age 78 if you have a 30-pack-year history of smoking and currently smoke or have quit within the past 15 years.  Fecal occult blood test (FOBT) of the stool. You may have this test every year starting at age 65.  Flexible sigmoidoscopy or colonoscopy. You may have a sigmoidoscopy every 5 years or a colonoscopy  every 10 years starting at age 30.  Hepatitis C blood test.  Hepatitis B blood test.  Sexually transmitted disease (STD) testing.  Diabetes screening. This is done by checking your blood sugar (glucose) after you have not eaten for a while (fasting). You may have this done every 1-3 years.  Mammogram. This may be done every 1-2 years. Talk to your health care provider about when you should start having regular mammograms. This may depend on whether you have a family history of breast cancer.  BRCA-related cancer screening. This may be done if you have a family history of breast, ovarian, tubal, or peritoneal cancers.  Pelvic exam and Pap test. This may be done every 3 years starting at age 80. Starting at age 36, this may be done every 5 years if you have a Pap test in combination with an HPV test.  Bone density scan. This is done to screen for osteoporosis. You may have this scan if you are at high risk for osteoporosis.  Discuss your test results, treatment options, and if necessary, the need for more tests with your health care provider. Vaccines Your health care provider may recommend certain vaccines, such as:  Influenza vaccine. This is recommended every year.  Tetanus, diphtheria, and acellular pertussis (Tdap, Td) vaccine. You may need a Td booster every 10 years.  Varicella vaccine. You may need this if you have not been vaccinated.  Zoster vaccine. You may need this after age 5.  Measles, mumps, and rubella (MMR) vaccine. You may need at least one dose of MMR if you were born in  1959 or later. You may also need a second dose.  Pneumococcal 13-valent conjugate (PCV13) vaccine. You may need this if you have certain conditions and were not previously vaccinated.  Pneumococcal polysaccharide (PPSV23) vaccine. You may need one or two doses if you smoke cigarettes or if you have certain conditions.  Meningococcal vaccine. You may need this if you have certain  conditions.  Hepatitis A vaccine. You may need this if you have certain conditions or if you travel or work in places where you may be exposed to hepatitis A.  Hepatitis B vaccine. You may need this if you have certain conditions or if you travel or work in places where you may be exposed to hepatitis B.  Haemophilus influenzae type b (Hib) vaccine. You may need this if you have certain conditions.  Talk to your health care provider about which screenings and vaccines you need and how often you need them. This information is not intended to replace advice given to you by your health care provider. Make sure you discuss any questions you have with your health care provider. Document Released: 06/14/2015 Document Revised: 02/05/2016 Document Reviewed: 03/19/2015 Elsevier Interactive Patient Education  2018 [employer]  

## 2017-10-27 DIAGNOSIS — K5904 Chronic idiopathic constipation: Secondary | ICD-10-CM | POA: Diagnosis not present

## 2017-10-27 DIAGNOSIS — K648 Other hemorrhoids: Secondary | ICD-10-CM | POA: Diagnosis not present

## 2017-10-27 LAB — CBC WITH DIFFERENTIAL/PLATELET
Basophils Absolute: 31 cells/uL (ref 0–200)
Basophils Relative: 0.6 %
EOS ABS: 109 {cells}/uL (ref 15–500)
Eosinophils Relative: 2.1 %
HCT: 41.2 % (ref 35.0–45.0)
Hemoglobin: 14 g/dL (ref 11.7–15.5)
Lymphs Abs: 2444 cells/uL (ref 850–3900)
MCH: 28.9 pg (ref 27.0–33.0)
MCHC: 34 g/dL (ref 32.0–36.0)
MCV: 84.9 fL (ref 80.0–100.0)
MONOS PCT: 5.2 %
MPV: 9.5 fL (ref 7.5–12.5)
NEUTROS PCT: 45.1 %
Neutro Abs: 2345 cells/uL (ref 1500–7800)
PLATELETS: 371 10*3/uL (ref 140–400)
RBC: 4.85 10*6/uL (ref 3.80–5.10)
RDW: 12.8 % (ref 11.0–15.0)
TOTAL LYMPHOCYTE: 47 %
WBC: 5.2 10*3/uL (ref 3.8–10.8)
WBCMIX: 270 {cells}/uL (ref 200–950)

## 2017-10-27 LAB — THYROID PANEL WITH TSH
Free Thyroxine Index: 2.2 (ref 1.4–3.8)
T3 Uptake: 28 % (ref 22–35)
T4, Total: 8 ug/dL (ref 5.1–11.9)
TSH: 0.41 mIU/L

## 2017-10-27 LAB — HEMOGLOBIN A1C
HEMOGLOBIN A1C: 5.1 %{Hb} (ref <5.7)
MEAN PLASMA GLUCOSE: 100 (calc)
eAG (mmol/L): 5.5 (calc)

## 2017-10-27 LAB — POTASSIUM: POTASSIUM: 3.8 mmol/L (ref 3.5–5.3)

## 2017-11-02 DIAGNOSIS — L700 Acne vulgaris: Secondary | ICD-10-CM | POA: Diagnosis not present

## 2017-11-02 DIAGNOSIS — L810 Postinflammatory hyperpigmentation: Secondary | ICD-10-CM | POA: Diagnosis not present

## 2017-11-08 ENCOUNTER — Encounter: Payer: Self-pay | Admitting: Family Medicine

## 2017-11-08 ENCOUNTER — Ambulatory Visit: Payer: Federal, State, Local not specified - PPO | Admitting: Family Medicine

## 2017-11-08 VITALS — BP 112/82 | HR 100 | Temp 98.2°F | Resp 16 | Ht 67.0 in | Wt 177.6 lb

## 2017-11-08 DIAGNOSIS — F17200 Nicotine dependence, unspecified, uncomplicated: Secondary | ICD-10-CM

## 2017-11-08 DIAGNOSIS — F339 Major depressive disorder, recurrent, unspecified: Secondary | ICD-10-CM

## 2017-11-08 DIAGNOSIS — F411 Generalized anxiety disorder: Secondary | ICD-10-CM

## 2017-11-08 MED ORDER — DULOXETINE HCL 60 MG PO CPEP: 120.0000 mg | ORAL_CAPSULE | Freq: Every day | ORAL | 0 refills | Status: DC

## 2017-11-08 MED ORDER — VARENICLINE TARTRATE 0.5 MG X 11 & 1 MG X 42 PO MISC: ORAL | 0 refills | Status: DC

## 2017-11-08 MED ORDER — VARENICLINE TARTRATE 1 MG PO TABS: 1.0000 mg | ORAL_TABLET | Freq: Two times a day (BID) | ORAL | 0 refills | Status: DC

## 2017-11-08 NOTE — Patient Instructions (Signed)
Suicidal Feelings: How to Help Yourself  Suicide is the taking of one's own life. If you feel as though life is getting too tough to handle and are thinking about suicide, get help right away. To get help:  Call your local emergency services (911 in the [place]  Call a suicide hotline to speak with a trained counselor who understands how you are feeling. The following is a list of suicide hotlines in the [place]. For a list of hotlines in [place], visit www.suicide.org/hotlines/international/[place]-suicide-hotlines.html.  1-800-273-TALK (1-800-273-8255).  1-800-SUICIDE (1-800-784-2433).  1-888-628-9454. This is a hotline for Spanish speakers.  1-800-799-4TTY (1-800-799-4889). This is a hotline for TTY users.  1-866-4-U-TREVOR (1-866-488-7386). This is a hotline for lesbian, gay, bisexual, transgender, or questioning youth.  Contact a crisis center or a local suicide prevention center. To find a crisis center or suicide prevention center:  Call your local hospital, clinic, community service organization, mental health center, social service provider, or health department. Ask for assistance in connecting to a crisis center.  Visit www.suicidepreventionlifeline.org/getinvolved/locator for a list of crisis centers in the [place], or visit www.suicideprevention.ca/thinking-about-suicide/find-a-crisis-centre for a list of centers in [place].  Visit the following websites:  National Suicide Prevention Lifeline: www.suicidepreventionlifeline.org  Hopeline: www.hopeline.com  American Foundation for Suicide Prevention: www.afsp.org  The Trevor Project (for lesbian, gay, bisexual, transgender, or questioning youth): www.thetrevorproject.org    How can I help myself feel better?  Promise yourself that you will not do anything drastic when you have suicidal feelings. Remember, there is hope. Many people have gotten through suicidal thoughts and feelings, and you will, too. You may have gotten through them before, and  this proves that you can get through them again.  Let family, friends, teachers, or counselors know how you are feeling. Try not to isolate yourself from those who care about you. Remember, they will want to help you. Talk with someone every day, even if you do not feel sociable. Face-to-face conversation is best.  Call a mental health professional and see one regularly.  Visit your primary health care provider every year.  Eat a well-balanced diet, and space your meals so you eat regularly.  Get plenty of rest.  Avoid alcohol and drugs, and remove them from your home. They will only make you feel worse.  If you are thinking of taking a lot of medicine, give your medicine to someone who can give it to you one day at a time. If you are on antidepressants and are concerned you will overdose, let your health care provider know so he or she can give you safer medicines. Ask your mental health professional about the possible side effects of any medicines you are taking.  Remove weapons, poisons, knives, and anything else that could harm you from your home.  Try to stick to routines. Follow a schedule every day. Put self-care on your schedule.  Make a list of realistic goals, and cross them off when you achieve them. Accomplishments give a sense of worth.  Wait until you are feeling better before doing the things you find difficult or unpleasant.  Exercise if you are able. You will feel better if you exercise for even a half hour each day.  Go out in the sun or into nature. This will help you recover from depression faster. If you have a favorite place to walk, go there.  Do the things that have always given you pleasure. Play your favorite music, read a good book, paint a picture, play your favorite   instrument, or do anything else that takes your mind off your depression if it is safe to do.  Keep your living space well lit.  When you are feeling well, write yourself a letter about tips and support that you can read when  you are not feeling well.  Remember that life's difficulties can be sorted out with help. Conditions can be treated. You can work on thoughts and strategies that serve you well.  This information is not intended to replace advice given to you by your health care provider. Make sure you discuss any questions you have with your health care provider.  Document Released: 11/22/2002 Document Revised: 01/15/2016 Document Reviewed: 09/12/2013  Elsevier Interactive Patient Education  2018 [employer]
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
## 2017-11-11 ENCOUNTER — Ambulatory Visit
Payer: Federal, State, Local not specified - PPO | Admitting: Student in an Organized Health Care Education/Training Program

## 2017-11-12 ENCOUNTER — Other Ambulatory Visit: Payer: Self-pay

## 2017-11-12 ENCOUNTER — Encounter (HOSPITAL_COMMUNITY): Payer: Self-pay

## 2017-11-12 ENCOUNTER — Inpatient Hospital Stay (HOSPITAL_COMMUNITY): Payer: Federal, State, Local not specified - PPO

## 2017-11-12 ENCOUNTER — Inpatient Hospital Stay (HOSPITAL_COMMUNITY)
Admission: AD | Admit: 2017-11-12 | Discharge: 2017-11-12 | Disposition: A | Discharge status: Home / Self Care | Payer: Federal, State, Local not specified - PPO | Source: Ambulatory Visit | Attending: Obstetrics and Gynecology | Admitting: Obstetrics and Gynecology

## 2017-11-12 DIAGNOSIS — Z90710 Acquired absence of both cervix and uterus: Secondary | ICD-10-CM | POA: Insufficient documentation

## 2017-11-12 DIAGNOSIS — R1032 Left lower quadrant pain: Secondary | ICD-10-CM | POA: Diagnosis not present

## 2017-11-12 DIAGNOSIS — G8929 Other chronic pain: Secondary | ICD-10-CM

## 2017-11-12 HISTORY — DX: Calculus of kidney: N20.0

## 2017-11-12 LAB — URINALYSIS, ROUTINE W REFLEX MICROSCOPIC
Glucose, UA: NEGATIVE mg/dL
Hgb urine dipstick: NEGATIVE
KETONES UR: 5 mg/dL — AB
Leukocytes, UA: NEGATIVE
Nitrite: NEGATIVE
PH: 5 (ref 5.0–8.0)
PROTEIN: 30 mg/dL — AB
Specific Gravity, Urine: 1.034 — ABNORMAL HIGH (ref 1.005–1.030)

## 2017-11-12 MED ORDER — TAPENTADOL HCL 75 MG PO TABS: 75.0000 mg | ORAL_TABLET | Freq: Once | ORAL | Status: DC

## 2017-11-12 NOTE — MAU Note (Signed)
Pt states she had left sided pain since January, has been followed for ovarian cysts and kidney stones.  Currently unable to get into office for appointment.  Pain is significantly;y increased today.

## 2017-11-12 NOTE — MAU Note (Addendum)
Pt presents with c/o left sided abdominal pain, reports history of ovarian cyst.  States receiving conflicting diagnoses, states told pain results from eitherovary vs. kidney stones, but stones non obstructing. Seen here in MAU April, transferred to [facility] to questionable kidney issue. Seen by urology and doesn't think it's kiney related. S/p hysterectomy 05/2016

## 2017-11-12 NOTE — [facility] Provider Note (Signed)
History     Chief Complaint  Patient presents with  . Abdominal Pain  . Nausea   42 yo  BF presents with worsening LLQ pain. Hx hysterectomy and left ovarian cyst. Pt reports pain started several days ago but has worsened today. Pt notes nausea w/o vomiting. Denies urinary sx.  BM nl. Pain is located left flank area radiate to left lower quadrant. Pain is constant. No fever  OB History    Gravida  3   Para  2   Term  2   Preterm  0   AB  1   Living  2     SAB  1   TAB  0   Ectopic  0   Multiple  0   Live Births              Past Medical History:  Diagnosis Date  . Anxiety    no current meds  . Arthritis    left knee pain - otc med prn  . Chronic insomnia   . Depression   . Diabetes mellitus without complication ([place])   . Dysrhythmia    Home with daughter [name]  . Endometriosis   . GERD (gastroesophageal reflux disease)    diet control - no meds  . Headache(784.0)    hx - last one 2 yrs ago - no meds  . Hypertension   . Kidney stones   . Metabolic syndrome   . Obesity   . Serum calcium elevated   . Sleep disorder, circadian, shift work type   . SVD (spontaneous vaginal delivery)    x 2  . Tachycardia    History - neg cardiac tests-stress related    Past Surgical History:  Procedure Laterality Date  . ABDOMINAL HYSTERECTOMY  05/21/2016  . DILATION AND CURETTAGE OF UTERUS    . EYE SURGERY  06/28/13   right laser eye surgery - repair retina  . HYSTEROSCOPY W/D&C N/A 07/19/2013   Procedure: DILATATION AND CURETTAGE /HYSTEROSCOPY;  Surgeon: Marvene Staff, MD;  Location: [facility];  Service: Gynecology;  Laterality: N/A;  . INDUCED ABORTION    . LAPAROSCOPY  1992   endometriosis  . LAPAROSCOPY N/A 07/19/2013   Procedure: LAPAROSCOPY DIAGNOSTIC  with resection of endometriosis;  Surgeon: Marvene Staff, MD;  Location: [facility];  Service: Gynecology;  Laterality: N/A;  . metatarsil   2003   left foot surgery     Family History   Problem Relation Age of Onset  . Diabetes Mother   . Hypertension Mother   . Hypertension Father   . Kidney disease Father   . Stroke Maternal Grandmother   . Leukemia Paternal Grandmother   . Alzheimer's disease Paternal Grandfather   . Heart disease Brother   . Anesthesia problems Neg Hx   . Hypotension Neg Hx   . Malignant hyperthermia Neg Hx   . Pseudochol deficiency Neg Hx     Social History   Tobacco Use  . Smoking status: Current Every Day Smoker    Packs/day: 0.50    Years: 24.00    Pack years: 12.00    Types: Cigarettes    Start date: 11/25/1993  . Smokeless tobacco: Never Used  Substance Use Topics  . Alcohol use: No    Alcohol/week: 0.0 oz  . Drug use: No    Allergies:  Allergies  Allergen Reactions  . Nuvigil [Armodafinil]     Chest pain     Medications Prior to Admission  Medication Sig Dispense Refill Last Dose  . Cyanocobalamin (B-12) 1000 MCG SUBL Place 1 mL under the tongue daily. 30 each 0 11/11/2017 at Unknown time  . DULoxetine (CYMBALTA) 60 MG capsule Take 2 capsules (120 mg total) by mouth daily. 180 capsule 0 11/11/2017 at Unknown time  . hydrochlorothiazide (HYDRODIURIL) 25 MG tablet Take 1 tablet (25 mg total) by mouth daily. Take half a tablet daily (Patient taking differently: Take 25 mg by mouth daily. ) 90 tablet 1 11/11/2017 at Unknown time  . metFORMIN (GLUCOPHAGE XR) 750 MG 24 hr tablet Take 1 tablet (750 mg total) by mouth daily with breakfast. 90 tablet 1 11/11/2017 at Unknown time  . Multiple Vitamin (MULTI-VITAMINS) TABS Take 1 tablet by mouth daily.    11/11/2017 at Unknown time  . tiZANidine (ZANAFLEX) 4 MG capsule Take 1 capsule (4 mg total) by mouth at bedtime. 90 capsule 1 11/11/2017 at Unknown time  . Semaglutide (OZEMPIC) 1 MG/DOSE SOPN Inject 1 mg into the skin once a week. 27 mL 1 11/06/2017  . varenicline (CHANTIX CONTINUING MONTH PAK) 1 MG tablet Take 1 tablet (1 mg total) by mouth 2 (two) times daily. (Patient not taking:  Reported on 11/12/2017) 60 tablet 0 Not Taking at Unknown time  . varenicline (CHANTIX STARTING MONTH PAK) 0.5 MG X 11 & 1 MG X 42 tablet Take as directed 53 tablet 0   . Vitamin D, Ergocalciferol, (DRISDOL) 50000 units CAPS capsule Take 1 capsule (50,000 Units total) by mouth once a week. 12 capsule 0 11/06/2017     Physical Exam   Blood pressure 114/77, pulse 88, temperature 97.8 F (36.6 C), temperature source Oral, resp. rate 20, height 5\' 7"  (1.702 m), weight 80.7 kg (178 lb), last menstrual period 07/09/2015, SpO2 100 %.  General appearance: alert, cooperative and mild distress Lungs: clear to auscultation bilaterally Heart: regular rate and rhythm, S1, S2 normal, no murmur, click, rub or gallop Abdomen: soft nondistended (+) BS tender in LLQ w/o rebound Pelvic: external genitalia normal, no adnexal masses or tenderness, no bladder tenderness, uterus surgically absent and vagina normal without discharge Extremities: no edema, redness or tenderness in the calves or thighs  Back no CVAT ED Course  IMP: LLQ pain Hx left ovarian cyst Hx hysterectomy with cervix P) pelvic sonogram MDM US Pelvis Complete  Result Date: 11/12/2017 CLINICAL DATA:  Left lower quadrant pain, prior hysterectomy EXAM: TRANSABDOMINAL ULTRASOUND OF PELVIS TECHNIQUE: Transabdominal ultrasound examination of the pelvis was performed including evaluation of the uterus, ovaries, adnexal regions, and pelvic cul-de-sac. COMPARISON:  CT abdomen/pelvis dated 09/09/2017 FINDINGS: Uterus Surgically absent. Right ovary Not discretely visualized.  No adnexal mass is seen. Left ovary Measurements: 1.9 x 1.5 x 1.6 cm. Poorly visualized but grossly unremarkable. Other findings:  No abnormal free fluid. IMPRESSION: Left ovary is grossly unremarkable. Right ovary is not discretely visualized. Status post hysterectomy. Electronically Signed   By: Julian Hy M.D.   On: 11/12/2017 16:49  reviewed with pt. Previously ovarian cyst  has resolved. She will need to f/u with pcp.  D/c home. Script for pain med at office  Marvene Staff, MD 2:43 PM 11/12/2017

## 2017-11-12 NOTE — Discharge Instructions (Signed)
Call [facility] regarding pain

## 2017-11-13 LAB — URINE CULTURE
Culture: <10000 — AB
Special Requests: NORMAL

## 2017-12-22 ENCOUNTER — Encounter: Payer: Self-pay | Admitting: Student in an Organized Health Care Education/Training Program

## 2017-12-22 ENCOUNTER — Ambulatory Visit
Payer: Federal, State, Local not specified - PPO | Attending: Student in an Organized Health Care Education/Training Program | Admitting: Student in an Organized Health Care Education/Training Program

## 2017-12-22 ENCOUNTER — Other Ambulatory Visit: Payer: Self-pay

## 2017-12-22 VITALS — BP 110/86 | HR 91 | Temp 97.8°F | Resp 16 | Ht 66.0 in | Wt 168.0 lb

## 2017-12-22 DIAGNOSIS — N809 Endometriosis, unspecified: Secondary | ICD-10-CM | POA: Diagnosis not present

## 2017-12-22 DIAGNOSIS — Z6827 Body mass index (BMI) 27.0-27.9, adult: Secondary | ICD-10-CM | POA: Diagnosis not present

## 2017-12-22 DIAGNOSIS — K5909 Other constipation: Secondary | ICD-10-CM | POA: Insufficient documentation

## 2017-12-22 DIAGNOSIS — N838 Other noninflammatory disorders of ovary, fallopian tube and broad ligament: Secondary | ICD-10-CM | POA: Diagnosis not present

## 2017-12-22 DIAGNOSIS — G894 Chronic pain syndrome: Secondary | ICD-10-CM

## 2017-12-22 DIAGNOSIS — F17210 Nicotine dependence, cigarettes, uncomplicated: Secondary | ICD-10-CM | POA: Diagnosis not present

## 2017-12-22 DIAGNOSIS — M5412 Radiculopathy, cervical region: Secondary | ICD-10-CM | POA: Diagnosis not present

## 2017-12-22 DIAGNOSIS — N200 Calculus of kidney: Secondary | ICD-10-CM | POA: Insufficient documentation

## 2017-12-22 DIAGNOSIS — Z79899 Other long term (current) drug therapy: Secondary | ICD-10-CM | POA: Diagnosis not present

## 2017-12-22 DIAGNOSIS — K219 Gastro-esophageal reflux disease without esophagitis: Secondary | ICD-10-CM | POA: Diagnosis not present

## 2017-12-22 DIAGNOSIS — E669 Obesity, unspecified: Secondary | ICD-10-CM | POA: Insufficient documentation

## 2017-12-22 DIAGNOSIS — E8881 Metabolic syndrome: Secondary | ICD-10-CM

## 2017-12-22 DIAGNOSIS — I10 Essential (primary) hypertension: Secondary | ICD-10-CM | POA: Insufficient documentation

## 2017-12-22 DIAGNOSIS — R1030 Lower abdominal pain, unspecified: Secondary | ICD-10-CM | POA: Insufficient documentation

## 2017-12-22 DIAGNOSIS — Z7984 Long term (current) use of oral hypoglycemic drugs: Secondary | ICD-10-CM | POA: Insufficient documentation

## 2017-12-22 DIAGNOSIS — R7303 Prediabetes: Secondary | ICD-10-CM | POA: Diagnosis not present

## 2017-12-22 DIAGNOSIS — E538 Deficiency of other specified B group vitamins: Secondary | ICD-10-CM | POA: Insufficient documentation

## 2017-12-22 DIAGNOSIS — K648 Other hemorrhoids: Secondary | ICD-10-CM | POA: Diagnosis not present

## 2017-12-22 DIAGNOSIS — Z8249 Family history of ischemic heart disease and other diseases of the circulatory system: Secondary | ICD-10-CM | POA: Insufficient documentation

## 2017-12-22 DIAGNOSIS — F320 Major depressive disorder, single episode, mild: Secondary | ICD-10-CM | POA: Diagnosis not present

## 2017-12-22 DIAGNOSIS — R102 Pelvic and perineal pain: Secondary | ICD-10-CM | POA: Diagnosis not present

## 2017-12-22 DIAGNOSIS — E041 Nontoxic single thyroid nodule: Secondary | ICD-10-CM | POA: Diagnosis not present

## 2017-12-22 DIAGNOSIS — G8929 Other chronic pain: Secondary | ICD-10-CM | POA: Insufficient documentation

## 2017-12-22 DIAGNOSIS — G4700 Insomnia, unspecified: Secondary | ICD-10-CM | POA: Diagnosis not present

## 2017-12-22 DIAGNOSIS — Z833 Family history of diabetes mellitus: Secondary | ICD-10-CM | POA: Insufficient documentation

## 2017-12-22 NOTE — Progress Notes (Signed)
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 Apley's scratch test (touch opposite shoulder):  Action 1 (Across chest): deferred Action 2 (Overhead): deferred Action 3 (LB reach): deferred  Thoracic Spine Area Exam  Skin & Axial Inspection: No masses, redness, or swelling Alignment: Symmetrical Functional ROM: Unrestricted ROM Stability: No instability detected Muscle Tone/Strength: Functionally intact. No obvious neuro-muscular anomalies detected. Sensory (Neurological): Unimpaired Muscle strength & Tone: No palpable anomalies  Lumbar Spine Area Exam  Skin & Axial Inspection: No masses, redness, or swelling Alignment: Symmetrical Functional ROM: Unrestricted ROM       Stability: No instability detected Muscle Tone/Strength: Functionally intact. No obvious neuro-muscular anomalies detected. Sensory (Neurological): Unimpaired Palpation: No palpable anomalies       Provocative Tests: Lumbar Hyperextension/rotation test: deferred today       Lumbar  quadrant test (Kemp's test): deferred today       Lumbar Lateral bending test: deferred today       Patrick's Maneuver: deferred today                   FABER test: deferred today                   Thigh-thrust test: deferred today       S-I compression test: deferred today       S-I distraction test: deferred today        Gait & Posture Assessment  Ambulation: Unassisted Gait: Relatively normal for age and body habitus Posture: WNL   Lower Extremity Exam    Side: Right lower extremity  Side: Left lower extremity  Stability: No instability observed          Stability: No instability observed          Skin & Extremity Inspection: Skin color, temperature, and hair growth are WNL. No peripheral edema or cyanosis. No masses, redness, swelling, asymmetry, or associated skin lesions. No contractures.  Skin & Extremity Inspection: Skin color, temperature, and hair growth are WNL. No peripheral edema or cyanosis. No masses, redness, swelling, asymmetry, or associated skin lesions. No contractures.  Functional ROM: Unrestricted ROM                  Functional ROM: Unrestricted ROM                  Muscle Tone/Strength: Functionally intact. No obvious neuro-muscular anomalies detected.  Muscle Tone/Strength: Functionally intact. No obvious neuro-muscular anomalies detected.  Sensory (Neurological): Unimpaired  Sensory (Neurological): Unimpaired  Palpation: No palpable anomalies  Palpation: No palpable anomalies   Assessment  Primary Diagnosis & Pertinent Problem List: The primary encounter diagnosis was Pelvic pain. Diagnoses of Cervical radiculitis, Endometriosis, Mild major depression ([place]), Dysmetabolic syndrome, Chronic pelvic pain in female, and Chronic pain syndrome were also pertinent to this visit.  Visit Diagnosis (New problems to examiner): 1. Pelvic pain   2. Cervical radiculitis   3. Endometriosis   4. Mild major depression ([place])   5. Dysmetabolic syndrome   6. Chronic pelvic pain  in female   7. Chronic pain syndrome    Plan of Care (Initial workup plan)  Note: Please be advised that as per protocol, today's visit has been an evaluation only. We have not taken over the patient's controlled substance management.  Pending UDS, can take over patients Nucynta 75 mg qday prn #30 which usually lasts 60 days  Ordered Lab-work, Procedure(s), Referral(s), & Consult(s): Orders Placed This Encounter  Procedures  . Compliance Drug Analysis, Ur    Pharmacological management options:  Opioid Analgesics: The patient was informed that there is no guarantee that she would be a candidate for opioid analgesics. The decision  will be made following CDC guidelines. This decision will be based on the results of diagnostic studies, as well as Ms. Noa's risk profile.   Membrane stabilizer: To be determined at a later time  Muscle relaxant: To be determined at a later time  NSAID: To be determined at a later time  Other analgesic(s): To be determined at a later time   Provider-requested follow-up: Return in about 6 weeks (around 02/02/2018) for Medication Management.  Future Appointments  Date Time Provider [place]  01/26/2018  9:40 AM Steele Sizer, MD [place] [facility]  02/02/2018 12:45 PM [name], MD [facility] None    Primary Care Physician: Steele Sizer, MD Location: [facility] Note by: [name], M.D, Date: 12/22/2017; Time: 3:53 PM  There are no Patient Instructions on file for this visit.

## 2017-12-22 NOTE — Progress Notes (Signed)
Safety precautions to be maintained throughout the outpatient stay will include: orient to surroundings, keep bed in low position, maintain call bell within reach at all times, provide assistance with transfer out of bed and ambulation.  

## 2017-12-26 LAB — COMPLIANCE DRUG ANALYSIS, UR

## 2017-12-27 ENCOUNTER — Telehealth: Payer: Self-pay

## 2017-12-27 NOTE — Telephone Encounter (Signed)
Pt called and needs work note for 12/25/17 until Aug 10th for chronic pelvic pain, can we write that or does she need appt.

## 2017-12-27 NOTE — Telephone Encounter (Signed)
Informed that a note for 2 weeks cannot be done. She is requesting appt with nurse practitioner.

## 2017-12-28 ENCOUNTER — Other Ambulatory Visit: Payer: Self-pay

## 2017-12-28 ENCOUNTER — Ambulatory Visit: Payer: Federal, State, Local not specified - PPO | Attending: Nurse Practitioner | Admitting: Nurse Practitioner

## 2017-12-28 ENCOUNTER — Encounter: Payer: Self-pay | Admitting: Nurse Practitioner

## 2017-12-28 VITALS — BP 111/76 | HR 99 | Temp 98.0°F | Resp 16 | Ht 66.0 in | Wt 160.0 lb

## 2017-12-28 DIAGNOSIS — Z90710 Acquired absence of both cervix and uterus: Secondary | ICD-10-CM | POA: Insufficient documentation

## 2017-12-28 DIAGNOSIS — M5412 Radiculopathy, cervical region: Secondary | ICD-10-CM | POA: Diagnosis not present

## 2017-12-28 DIAGNOSIS — F17210 Nicotine dependence, cigarettes, uncomplicated: Secondary | ICD-10-CM | POA: Diagnosis not present

## 2017-12-28 DIAGNOSIS — Z6839 Body mass index (BMI) 39.0-39.9, adult: Secondary | ICD-10-CM | POA: Insufficient documentation

## 2017-12-28 DIAGNOSIS — K219 Gastro-esophageal reflux disease without esophagitis: Secondary | ICD-10-CM | POA: Diagnosis not present

## 2017-12-28 DIAGNOSIS — F329 Major depressive disorder, single episode, unspecified: Secondary | ICD-10-CM | POA: Insufficient documentation

## 2017-12-28 DIAGNOSIS — K648 Other hemorrhoids: Secondary | ICD-10-CM | POA: Diagnosis not present

## 2017-12-28 DIAGNOSIS — Z79899 Other long term (current) drug therapy: Secondary | ICD-10-CM

## 2017-12-28 DIAGNOSIS — G894 Chronic pain syndrome: Secondary | ICD-10-CM

## 2017-12-28 DIAGNOSIS — E041 Nontoxic single thyroid nodule: Secondary | ICD-10-CM | POA: Diagnosis not present

## 2017-12-28 DIAGNOSIS — Z87442 Personal history of urinary calculi: Secondary | ICD-10-CM | POA: Insufficient documentation

## 2017-12-28 DIAGNOSIS — K5909 Other constipation: Secondary | ICD-10-CM | POA: Insufficient documentation

## 2017-12-28 DIAGNOSIS — I10 Essential (primary) hypertension: Secondary | ICD-10-CM | POA: Diagnosis not present

## 2017-12-28 DIAGNOSIS — R7303 Prediabetes: Secondary | ICD-10-CM | POA: Diagnosis not present

## 2017-12-28 DIAGNOSIS — F1190 Opioid use, unspecified, uncomplicated: Secondary | ICD-10-CM

## 2017-12-28 DIAGNOSIS — E8881 Metabolic syndrome: Secondary | ICD-10-CM | POA: Insufficient documentation

## 2017-12-28 DIAGNOSIS — Z7984 Long term (current) use of oral hypoglycemic drugs: Secondary | ICD-10-CM | POA: Insufficient documentation

## 2017-12-28 DIAGNOSIS — G8929 Other chronic pain: Secondary | ICD-10-CM

## 2017-12-28 DIAGNOSIS — R102 Pelvic and perineal pain: Secondary | ICD-10-CM

## 2017-12-28 DIAGNOSIS — G4700 Insomnia, unspecified: Secondary | ICD-10-CM | POA: Diagnosis not present

## 2017-12-28 DIAGNOSIS — E538 Deficiency of other specified B group vitamins: Secondary | ICD-10-CM | POA: Insufficient documentation

## 2017-12-28 DIAGNOSIS — E669 Obesity, unspecified: Secondary | ICD-10-CM | POA: Diagnosis not present

## 2017-12-28 DIAGNOSIS — F419 Anxiety disorder, unspecified: Secondary | ICD-10-CM | POA: Diagnosis not present
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
## 2017-12-28 NOTE — Progress Notes (Signed)
Safety precautions to be maintained throughout the outpatient stay will include: orient to surroundings, keep bed in low position, maintain call bell within reach at all times, provide assistance with transfer out of bed and ambulation.  

## 2017-12-28 NOTE — Patient Instructions (Signed)
  ____________________________________________________________________________________________  Appointment Policy Summary  It is our goal and responsibility to provide the medical community with assistance in the evaluation and management of patients with chronic pain. Unfortunately our resources are limited. Because we do not have an unlimited amount of time, or available appointments, we are required to closely monitor and manage their use. The following rules exist to maximize their use:  Patient's responsibilities: 1. Punctuality:  At what time should I arrive? You should be physically present in our office 30 minutes before your scheduled appointment. Your scheduled appointment is with your assigned healthcare provider. However, it takes 5-10 minutes to be "checked-in", and another 15 minutes for the nurses to do the admission. If you arrive to our office at the time you were given for your appointment, you will end up being at least 20-25 minutes late to your appointment with the provider. 2. Tardiness:  What happens if I arrive only a few minutes after my scheduled appointment time? You will need to reschedule your appointment. The cutoff is your appointment time. This is why it is so important that you arrive at least 30 minutes before that appointment. If you have an appointment scheduled for 10:00 AM and you arrive at 10:01, you will be required to reschedule your appointment.  3. Plan ahead:  Always assume that you will encounter traffic on your way in. Plan for it. If you are dependent on a driver, make sure they understand these rules and the need to arrive early. 4. Other appointments and responsibilities:  Avoid scheduling any other appointments before or after your pain clinic appointments.  5. Be prepared:  Write down everything that you need to discuss with your healthcare provider and give this information to the admitting nurse. Write down the medications that you will need  refilled. Bring your pills and bottles (even the empty ones), to all of your appointments, except for those where a procedure is scheduled. 6. No children or pets:  Find someone to take care of them. It is not appropriate to bring them in. 7. Scheduling changes:  We request "advanced notification" of any changes or cancellations. 8. Advanced notification:  Defined as a time period of more than 24 hours prior to the originally scheduled appointment. This allows for the appointment to be offered to other patients. 9. Rescheduling:  When a visit is rescheduled, it will require the cancellation of the original appointment. For this reason they both fall within the category of "Cancellations".  10. Cancellations:  They require advanced notification. Any cancellation less than 24 hours before the  appointment will be recorded as a "No Show". 11. No Show:  Defined as an unkept appointment where the patient failed to notify or declare to the practice their intention or inability to keep the appointment.  Corrective process for repeat offenders:  1. Tardiness: Three (3) episodes of rescheduling due to late arrivals will be recorded as one (1) "No Show". 2. Cancellation or reschedule: Three (3) cancellations or rescheduling will be recorded as one (1) "No Show". 3. "No Shows": Three (3) "No Shows" within a 12 month period will result in discharge from the practice.  ____________________________________________________________________________________________   

## 2017-12-30 ENCOUNTER — Telehealth: Payer: Self-pay | Admitting: Family Medicine

## 2017-12-30 NOTE — Telephone Encounter (Signed)
Hypertension medication request:  HCTZ  Last office visit pertaining to hypertension:  11/08/2017  BP Readings from Last 3 Encounters:  12/28/17 111/76  12/22/17 110/86  11/12/17 115/77    Lab Results  Component Value Date   CREATININE 0.90 09/15/2017   BUN 12 09/15/2017   NA 140 09/15/2017   K 3.8 10/26/2017   CL 105 09/15/2017   CO2 28 09/15/2017     Follow up on 01/26/18

## 2018-01-03 ENCOUNTER — Other Ambulatory Visit: Payer: Self-pay | Admitting: Family Medicine

## 2018-01-03 MED ORDER — HYDROCHLOROTHIAZIDE 12.5 MG PO TABS: 12.5000 mg | ORAL_TABLET | Freq: Every day | ORAL | 0 refills | Status: DC

## 2018-01-03 NOTE — Progress Notes (Unsigned)
It was discontinued by Dr. Florene Glen on 09/09/2017 after hospital stay. I will send 12.5 mg until her follow up

## 2018-01-03 NOTE — Telephone Encounter (Signed)
Pt calling because pharmacy let her know refill of hydrochlorothiazide (HYDRODIURIL) 25 MG tablet has been denied.  Pt states she has been taking this medication for the past five years.  Pt is using  [facility] [address] [phone] (Phone) [phone] (Fax)     Pt can be reached at [phone]

## 2018-01-04 NOTE — Telephone Encounter (Signed)
Informed patient of medication Hydrochlorothiazide being sent into her pharmacy at a lower dose until her appt. Since Dr. Florene Glen D/C the HCTZ 25 mg.

## 2018-01-04 NOTE — Telephone Encounter (Signed)
Pt returned call, pt is requesting a call back from [place]. Pt says that Dr. Florene Glen is the hospital ist so she's not sure why he took it amongst himself to discontinue her BP medication. Pt says that she dont want her medications to be changed. Please discuss with pt further.    CB: [phone]

## 2018-01-06 NOTE — Telephone Encounter (Signed)
°  Called [phone] @ 2:50 and left voice message informing pt to give [place] a call to schedule appt Per Dr Ancil Boozer request.

## 2018-01-10 NOTE — Telephone Encounter (Signed)
Called and left a message about the patient scheduling a appointment due to Dr. Florene Glen cancelling her BP medication. Dr. Ancil Boozer has not seen her since this change at the hospital and will need to be seen.

## 2018-01-10 NOTE — Telephone Encounter (Signed)
She can check bp at home and if at goal no need to come in sooner, otherwise needs a follow up

## 2018-01-10 NOTE — Telephone Encounter (Signed)
Pt has an appt on the 01/26/18 and want to know if she can discuss BP meds at that appt. Please advise if pt needs to be seen before 01/26/18.

## 2018-01-26 ENCOUNTER — Other Ambulatory Visit: Payer: Self-pay

## 2018-01-26 ENCOUNTER — Other Ambulatory Visit: Payer: Self-pay | Admitting: Family Medicine

## 2018-01-26 ENCOUNTER — Encounter: Payer: Self-pay | Admitting: Student in an Organized Health Care Education/Training Program

## 2018-01-26 ENCOUNTER — Encounter: Payer: Self-pay | Admitting: Family Medicine

## 2018-01-26 ENCOUNTER — Ambulatory Visit: Payer: Federal, State, Local not specified - PPO | Admitting: Family Medicine

## 2018-01-26 ENCOUNTER — Ambulatory Visit
Payer: Federal, State, Local not specified - PPO | Attending: Student in an Organized Health Care Education/Training Program | Admitting: Student in an Organized Health Care Education/Training Program

## 2018-01-26 VITALS — BP 116/84 | HR 90 | Temp 98.1°F | Resp 16 | Ht 65.0 in | Wt 159.0 lb

## 2018-01-26 VITALS — BP 110/78 | HR 83 | Temp 98.0°F | Resp 14 | Ht 66.0 in | Wt 159.0 lb

## 2018-01-26 DIAGNOSIS — R7303 Prediabetes: Secondary | ICD-10-CM

## 2018-01-26 DIAGNOSIS — F1190 Opioid use, unspecified, uncomplicated: Secondary | ICD-10-CM | POA: Diagnosis not present

## 2018-01-26 DIAGNOSIS — R102 Pelvic and perineal pain: Secondary | ICD-10-CM | POA: Diagnosis not present

## 2018-01-26 DIAGNOSIS — Z23 Encounter for immunization: Secondary | ICD-10-CM | POA: Diagnosis not present

## 2018-01-26 DIAGNOSIS — Z79891 Long term (current) use of opiate analgesic: Secondary | ICD-10-CM | POA: Insufficient documentation

## 2018-01-26 DIAGNOSIS — E8881 Metabolic syndrome: Secondary | ICD-10-CM

## 2018-01-26 DIAGNOSIS — E559 Vitamin D deficiency, unspecified: Secondary | ICD-10-CM

## 2018-01-26 DIAGNOSIS — Z760 Encounter for issue of repeat prescription: Secondary | ICD-10-CM | POA: Insufficient documentation

## 2018-01-26 DIAGNOSIS — F329 Major depressive disorder, single episode, unspecified: Secondary | ICD-10-CM | POA: Diagnosis not present

## 2018-01-26 DIAGNOSIS — G894 Chronic pain syndrome: Secondary | ICD-10-CM

## 2018-01-26 DIAGNOSIS — M5412 Radiculopathy, cervical region: Secondary | ICD-10-CM

## 2018-01-26 DIAGNOSIS — E119 Type 2 diabetes mellitus without complications: Secondary | ICD-10-CM | POA: Insufficient documentation

## 2018-01-26 DIAGNOSIS — F17210 Nicotine dependence, cigarettes, uncomplicated: Secondary | ICD-10-CM | POA: Insufficient documentation

## 2018-01-26 DIAGNOSIS — R51 Headache: Secondary | ICD-10-CM | POA: Insufficient documentation

## 2018-01-26 DIAGNOSIS — Z7984 Long term (current) use of oral hypoglycemic drugs: Secondary | ICD-10-CM | POA: Insufficient documentation

## 2018-01-26 DIAGNOSIS — K219 Gastro-esophageal reflux disease without esophagitis: Secondary | ICD-10-CM | POA: Diagnosis not present

## 2018-01-26 DIAGNOSIS — F419 Anxiety disorder, unspecified: Secondary | ICD-10-CM | POA: Diagnosis not present

## 2018-01-26 DIAGNOSIS — F320 Major depressive disorder, single episode, mild: Secondary | ICD-10-CM

## 2018-01-26 DIAGNOSIS — F411 Generalized anxiety disorder: Secondary | ICD-10-CM

## 2018-01-26 DIAGNOSIS — Z90710 Acquired absence of both cervix and uterus: Secondary | ICD-10-CM | POA: Insufficient documentation

## 2018-01-26 DIAGNOSIS — Z79899 Other long term (current) drug therapy: Secondary | ICD-10-CM | POA: Diagnosis not present

## 2018-01-26 DIAGNOSIS — R1032 Left lower quadrant pain: Secondary | ICD-10-CM | POA: Insufficient documentation

## 2018-01-26 DIAGNOSIS — I10 Essential (primary) hypertension: Secondary | ICD-10-CM | POA: Insufficient documentation

## 2018-01-26 DIAGNOSIS — F5104 Psychophysiologic insomnia: Secondary | ICD-10-CM | POA: Diagnosis not present

## 2018-01-26 DIAGNOSIS — F339 Major depressive disorder, recurrent, unspecified: Secondary | ICD-10-CM | POA: Diagnosis not present

## 2018-01-26 DIAGNOSIS — E669 Obesity, unspecified: Secondary | ICD-10-CM | POA: Diagnosis not present

## 2018-01-26 DIAGNOSIS — Z6826 Body mass index (BMI) 26.0-26.9, adult: Secondary | ICD-10-CM | POA: Diagnosis not present

## 2018-01-26 DIAGNOSIS — N809 Endometriosis, unspecified: Secondary | ICD-10-CM | POA: Diagnosis not present

## 2018-01-26 DIAGNOSIS — E538 Deficiency of other specified B group vitamins: Secondary | ICD-10-CM | POA: Insufficient documentation

## 2018-01-26 DIAGNOSIS — Z5181 Encounter for therapeutic drug level monitoring: Secondary | ICD-10-CM | POA: Insufficient documentation

## 2018-01-26 DIAGNOSIS — G8929 Other chronic pain: Secondary | ICD-10-CM

## 2018-01-26 MED ORDER — METFORMIN HCL ER 750 MG PO TB24: 750.0000 mg | ORAL_TABLET | Freq: Every day | ORAL | 1 refills | Status: DC

## 2018-01-26 MED ORDER — TAPENTADOL HCL 75 MG PO TABS: 75.0000 mg | ORAL_TABLET | Freq: Every day | ORAL | 0 refills | Status: AC | PRN

## 2018-01-26 MED ORDER — METAXALONE 800 MG PO TABS: 800.0000 mg | ORAL_TABLET | Freq: Three times a day (TID) | ORAL | 2 refills | Status: AC

## 2018-01-26 MED ORDER — DULOXETINE HCL 60 MG PO CPEP: 120.0000 mg | ORAL_CAPSULE | Freq: Every day | ORAL | 0 refills | Status: DC

## 2018-01-26 MED ORDER — HYDROCHLOROTHIAZIDE 12.5 MG PO TABS: 12.5000 mg | ORAL_TABLET | Freq: Every day | ORAL | 0 refills | Status: DC

## 2018-01-26 MED ORDER — SEMAGLUTIDE (1 MG/DOSE) 2 MG/1.5ML ~~LOC~~ SOPN: 1.0000 mg | PEN_INJECTOR | SUBCUTANEOUS | 1 refills | Status: DC

## 2018-01-26 MED ORDER — VITAMIN D (ERGOCALCIFEROL) 1.25 MG (50000 UNIT) PO CAPS: 50000.0000 [IU] | ORAL_CAPSULE | ORAL | 0 refills | Status: DC

## 2018-01-26 MED ORDER — TEMAZEPAM 15 MG PO CAPS: 15.0000 mg | ORAL_CAPSULE | Freq: Every evening | ORAL | 0 refills | Status: DC | PRN

## 2018-01-26 NOTE — Patient Instructions (Signed)
1. Sign opioid contract 2. Rx for Skelaxin and Nucynta for 3 months

## 2018-01-26 NOTE — Progress Notes (Signed)
Safety precautions to be maintained throughout the outpatient stay will include: orient to surroundings, keep bed in low position, maintain call bell within reach at all times, provide assistance with transfer out of bed and ambulation.  
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
## 2018-02-01 ENCOUNTER — Ambulatory Visit: Payer: Federal, State, Local not specified - PPO | Admitting: Nurse Practitioner

## 2018-02-01 ENCOUNTER — Encounter: Payer: Self-pay | Admitting: Nurse Practitioner

## 2018-02-01 VITALS — BP 90/60 | HR 115 | Temp 98.0°F | Resp 16 | Ht 66.0 in | Wt 156.9 lb

## 2018-02-01 DIAGNOSIS — K644 Residual hemorrhoidal skin tags: Secondary | ICD-10-CM | POA: Diagnosis not present

## 2018-02-01 DIAGNOSIS — K625 Hemorrhage of anus and rectum: Secondary | ICD-10-CM

## 2018-02-01 LAB — CBC
HEMATOCRIT: 43.3 % (ref 35.0–45.0)
HEMOGLOBIN: 14.6 g/dL (ref 11.7–15.5)
MCH: 28.8 pg (ref 27.0–33.0)
MCHC: 33.7 g/dL (ref 32.0–36.0)
MCV: 85.4 fL (ref 80.0–100.0)
MPV: 9.9 fL (ref 7.5–12.5)
Platelets: 315 10*3/uL (ref 140–400)
RBC: 5.07 10*6/uL (ref 3.80–5.10)
RDW: 13.3 % (ref 11.0–15.0)
WBC: 5.7 10*3/uL (ref 3.8–10.8)

## 2018-02-01 MED ORDER — NITROGLYCERIN 0.4 % RE OINT: 1.0000 "application " | TOPICAL_OINTMENT | Freq: Two times a day (BID) | RECTAL | 0 refills | Status: DC

## 2018-02-01 NOTE — Progress Notes (Signed)
Name: Stacey Stanley   MRN: [id number]    DOB: 12/19/1975   Date:02/01/2018       Progress Note  Subjective  Chief Complaint  Chief Complaint  Patient presents with  . Hemorrhoids    HPI  Patient has dealt with hemorrhoids ongoing for years has had internal hemorrhoids banded a few months ago. External hemorrhoids large. States has had significant bleeding started yesterday- significant bleeding and large clots filled the toilet.   Patient Active Problem List   Diagnosis Date Noted  . Chronic pain syndrome 12/28/2017  . Opiate use 12/28/2017  . Long term prescription benzodiazepine use 12/28/2017  . Thrombocytosis ([place]) 10/26/2017  . Internal hemorrhoids 10/13/2017  . Chronic idiopathic constipation 10/13/2017  . Rectal bleed 09/10/2017  . Hypomagnesemia 09/10/2017  . Pelvic pain 09/09/2017  . Mass of left ovary 06/28/2017  . Kidney stone on left side 06/28/2017  . Cervical radiculitis 03/08/2017  . Tobacco dependence 10/28/2016  . Vitamin B12 deficiency 08/19/2016  . Prediabetes 08/19/2016  . Right thyroid nodule 07/31/2015  . Chronic pelvic pain in female 04/05/2015  . GERD (gastroesophageal reflux disease) 04/05/2015  . Mild major depression ([place]) 11/25/2014  . Insomnia, persistent 11/25/2014  . Endometriosis 11/25/2014  . Essential (primary) hypertension 11/25/2014  . Dysmetabolic syndrome 35/57/3220  . Obesity (BMI 30-39.9) 11/25/2014    Past Medical History:  Diagnosis Date  . Anxiety    no current meds  . Arthritis    left knee pain - otc med prn  . Chronic insomnia   . Depression   . Diabetes mellitus without complication ([place])   . Dysrhythmia    Home with daughter [name]  . Endometriosis   . GERD (gastroesophageal reflux disease)    diet control - no meds  . Headache(784.0)    hx - last one 2 yrs ago - no meds  . Hypertension   . Kidney stones   . Metabolic syndrome   . Obesity   . Serum calcium elevated   . Sleep disorder, circadian,  shift work type   . SVD (spontaneous vaginal delivery)    x 2  . Tachycardia    History - neg cardiac tests-stress related    Past Surgical History:  Procedure Laterality Date  . ABDOMINAL HYSTERECTOMY  05/21/2016  . DILATION AND CURETTAGE OF UTERUS    . EYE SURGERY  06/28/13   right laser eye surgery - repair retina  . HYSTEROSCOPY W/D&C N/A 07/19/2013   Procedure: DILATATION AND CURETTAGE /HYSTEROSCOPY;  Surgeon: Marvene Staff, MD;  Location: [facility];  Service: Gynecology;  Laterality: N/A;  . INDUCED ABORTION    . LAPAROSCOPY  1992   endometriosis  . LAPAROSCOPY N/A 07/19/2013   Procedure: LAPAROSCOPY DIAGNOSTIC  with resection of endometriosis;  Surgeon: Marvene Staff, MD;  Location: [facility];  Service: Gynecology;  Laterality: N/A;  . metatarsil   2003   left foot surgery     Social History   Tobacco Use  . Smoking status: Current Every Day Smoker    Packs/day: 0.50    Years: 24.00    Pack years: 12.00    Types: Cigarettes    Start date: 11/25/1993  . Smokeless tobacco: Never Used  Substance Use Topics  . Alcohol use: No    Alcohol/week: 0.0 standard drinks     Current Outpatient Medications:  .  acetaminophen (TYLENOL) 500 MG tablet, Take 1,000 mg by mouth every 8 (eight) hours as needed., Disp: , Rfl:  .  Cyanocobalamin (B-12) 1000 MCG SUBL, Place 1 mL under the tongue daily., Disp: 30 each, Rfl: 0 .  DULoxetine (CYMBALTA) 60 MG capsule, Take 2 capsules (120 mg total) by mouth daily., Disp: 180 capsule, Rfl: 0 .  hydrochlorothiazide (HYDRODIURIL) 12.5 MG tablet, Take 1 tablet (12.5 mg total) by mouth daily., Disp: 90 tablet, Rfl: 0 .  ibuprofen (ADVIL,MOTRIN) 800 MG tablet, Take 800 mg by mouth every 8 (eight) hours as needed., Disp: , Rfl:  .  metaxalone (SKELAXIN) 800 MG tablet, Take 1 tablet (800 mg total) by mouth 3 (three) times daily., Disp: 90 tablet, Rfl: 2 .  metFORMIN (GLUCOPHAGE XR) 750 MG 24 hr tablet, Take 1 tablet (750 mg total) by mouth  daily with breakfast., Disp: 90 tablet, Rfl: 1 .  Multiple Vitamin (MULTI-VITAMINS) TABS, Take 1 tablet by mouth daily. , Disp: , Rfl:  .  Semaglutide (OZEMPIC) 1 MG/DOSE SOPN, Inject 1 mg into the skin once a week., Disp: 27 mL, Rfl: 1 .  tapentadol HCl (NUCYNTA) 75 MG tablet, Take 1 tablet (75 mg total) by mouth daily as needed., Disp: 30 tablet, Rfl: 0 .  [START ON 02/25/2018] tapentadol HCl (NUCYNTA) 75 MG tablet, Take 1 tablet (75 mg total) by mouth daily as needed. Do not take if you have epilepsy or a history of seizures. Swallow tablets whole., Disp: 30 tablet, Rfl: 0 .  [START ON 03/27/2018] tapentadol HCl (NUCYNTA) 75 MG tablet, Take 1 tablet (75 mg total) by mouth daily as needed. Do not take if you have epilepsy or a history of seizures. Swallow tablets whole., Disp: 30 tablet, Rfl: 0 .  temazepam (RESTORIL) 15 MG capsule, Take 1 capsule (15 mg total) by mouth at bedtime as needed for sleep., Disp: 30 capsule, Rfl: 0 .  Vitamin D, Ergocalciferol, (DRISDOL) 50000 units CAPS capsule, TAKE 1 CAPSULE BY MOUTH 1 TIME A WEEK, Disp: 13 capsule, Rfl: 0  Allergies  Allergen Reactions  . Nuvigil [Armodafinil]     Chest pain     ROS   No other specific complaints in a complete review of systems (except as listed in HPI above).  Objective  Vitals:   02/01/18 1447  BP: 90/60  Pulse: (!) 115  Resp: 16  Temp: 98 F (36.7 C)  TempSrc: Oral  SpO2: 98%  Weight: 156 lb 14.4 oz (71.2 kg)  Height: 5\' 6"  (1.676 m)     Body mass index is 25.32 kg/m.  Nursing Note and Vital Signs reviewed.  Physical Exam  Constitutional: She appears well-developed and well-nourished.  Appears uncomfortable but in no acute distress   Eyes: Conjunctivae are normal.  Cardiovascular: Normal heart sounds and intact distal pulses.  Elevated rate   Pulmonary/Chest: Effort normal and breath sounds normal.  Genitourinary:     Skin: Skin is warm and dry. Capillary refill takes less than 2 seconds.    Psychiatric: She has a normal mood and affect. Her behavior is normal. Judgment and thought content normal.       No results found for this or any previous visit (from the past 48 hour(s)).  Assessment & Plan  1. Rectal bleeding - Schedule appointment with GI  - CBC  2. External hemorrhoids - Has appointment with surgeon on the 9/11; has called for earlier appointment, discussed OTC pain relief and ER precautions. See AVS  - CBC - Nitroglycerin 0.4 % OINT; Place 1 application rectally 2 (two) times daily.  Dispense: 30 g; Refill: 0    -Red flags  and when to present for emergency care or RTC including dizziness, chest pain, shortness of breath, new/worsening/un-resolving symptoms, severe pain or increased bleeding reviewed with patient at time of visit. Follow up and care instructions discussed and provided in AVS.

## 2018-02-01 NOTE — Patient Instructions (Addendum)
-  First-line conservative treatment of hemorrhoids consists of a high-fiber diet (25 to 35 g per day), fiber supplementation, increased water intake, warm water (sitz) baths, and stool softeners. -Use Recticare with lidocaine Ointment this is an over the counter ointment that can be found at most pharmacies on the hemorrhoid aisle.  -We have sent a prescription for nitroglycerin ointment. You should apply a pea size amount to your rectum two times daily. - Please follow up with GI and surgery.  - Please go the ER is pain is uncontrolled, having dizziness, or uncontrolled bleeding

## 2018-02-02 ENCOUNTER — Telehealth: Payer: Self-pay

## 2018-02-02 ENCOUNTER — Other Ambulatory Visit: Payer: Self-pay | Admitting: Nurse Practitioner

## 2018-02-02 ENCOUNTER — Encounter
Payer: Federal, State, Local not specified - PPO | Admitting: Student in an Organized Health Care Education/Training Program

## 2018-02-02 NOTE — Telephone Encounter (Signed)
Left detailed voicemail

## 2018-02-02 NOTE — Telephone Encounter (Signed)
There is no cheaper ointment that I can think of, may want to try other pharmacies or contact her GI doctor. I know of a compounded nitroglycerin with lidocaine that has to be made by pharmacy but that is more expensive.

## 2018-02-02 NOTE — Progress Notes (Unsigned)
non

## 2018-02-02 NOTE — Telephone Encounter (Signed)
Pt called states ointment sent in yesterday is to expensive can you send something cheaper?

## 2018-02-09 ENCOUNTER — Ambulatory Visit: Payer: Self-pay | Admitting: General Surgery

## 2018-02-09 DIAGNOSIS — K644 Residual hemorrhoidal skin tags: Secondary | ICD-10-CM | POA: Diagnosis not present

## 2018-02-14 ENCOUNTER — Encounter: Payer: Self-pay | Admitting: Family Medicine

## 2018-02-18 NOTE — Pre-Procedure Instructions (Signed)
HARLEEN FINEBERG  02/18/2018      [id number] [address], [place] AT [facility] [address] [place] [address] Phone: [phone] Fax: [phone]    Your procedure is scheduled on Monday September 30.  Report to [facility] Admitting at 5:30 A.M.  Call this number if you have problems the morning of surgery:  [phone]   Remember:  Do not eat or drink after midnight.  You may drink clear liquids until 4:30 AM (3 hours prior to surgery) .  Clear liquids allowed BWL:SLHTD (non-citric and without pulp), Carbonated beverages, Black Coffee only and Gatorade    Take these medicines the morning of surgery with A SIP OF WATER:   Duloxetine (Cymbalta)  Acetaminophen (Tylenol) if needed Metaxalone (skelaxin)  Tapentadol (Nucynta) if needed  DO NOT TAKE Metformin (GLucophage) the day of surgery  DO NOT TAKE Semaglutide (Ozempic) the day of surgery  7 days prior to surgery STOP taking any Aspirin(unless otherwise instructed by your surgeon), Aleve, Naproxen, Ibuprofen, Motrin, Advil, Goody's, BC's, all herbal medications, fish oil, and all vitamins     How to Manage Your Diabetes Before and After Surgery  Why is it important to control my blood sugar before and after surgery? . Improving blood sugar levels before and after surgery helps healing and can limit problems. . A way of improving blood sugar control is eating a healthy diet by: o  Eating less sugar and carbohydrates o  Increasing activity/exercise o  Talking with your doctor about reaching your blood sugar goals . High blood sugars (greater than 180 mg/dL) can raise your risk of infections and slow your recovery, so you will need to focus on controlling your diabetes during the weeks before surgery. . Make sure that the doctor who takes care of your diabetes knows about your planned surgery including the date and location.  How do I manage my  blood sugar before surgery? . Check your blood sugar at least 4 times a day, starting 2 days before surgery, to make sure that the level is not too high or low. o Check your blood sugar the morning of your surgery when you wake up and every 2 hours until you get to the Short Stay unit. . If your blood sugar is less than 70 mg/dL, you will need to treat for low blood sugar: o Do not take insulin. o Treat a low blood sugar (less than 70 mg/dL) with  cup of clear juice (cranberry or apple), 4 glucose tablets, OR glucose gel. Recheck blood sugar in 15 minutes after treatment (to make sure it is greater than 70 mg/dL). If your blood sugar is not greater than 70 mg/dL on recheck, call [phone] o  for further instructions. . Report your blood sugar to the short stay nurse when you get to Short Stay.  . If you are admitted to the hospital after surgery: o Your blood sugar will be checked by the staff and you will probably be given insulin after surgery (instead of oral diabetes medicines) to make sure you have good blood sugar levels. o The goal for blood sugar control after surgery is 80-180 mg/dL.              Do not wear jewelry, make-up or nail polish.  Do not wear lotions, powders, or perfumes, or deodorant.  Do not shave 48 hours prior to surgery.  Men  may shave face and neck.  Do not bring valuables to the hospital.  [facility] is not responsible for any belongings or valuables.  Contacts, dentures or bridgework may not be worn into surgery.  Leave your suitcase in the car.  After surgery it may be brought to your room.  For patients admitted to the hospital, discharge time will be determined by your treatment team.  Patients discharged the day of surgery will not be allowed to drive home.   Special instructions:    [facility]- Preparing For Surgery  Before surgery, you can play an important role. Because skin is not sterile, your skin needs to be as free of germs as  possible. You can reduce the number of germs on your skin by washing with CHG (chlorahexidine gluconate) Soap before surgery.  CHG is an antiseptic cleaner which kills germs and bonds with the skin to continue killing germs even after washing.    Oral Hygiene is also important to reduce your risk of infection.  Remember - BRUSH YOUR TEETH THE MORNING OF SURGERY WITH YOUR REGULAR TOOTHPASTE  Please do not use if you have an allergy to CHG or antibacterial soaps. If your skin becomes reddened/irritated stop using the CHG.  Do not shave (including legs and underarms) for at least 48 hours prior to first CHG shower. It is OK to shave your face.  Please follow these instructions carefully.   1. Shower the NIGHT BEFORE SURGERY and the MORNING OF SURGERY with CHG.   2. If you chose to wash your hair, wash your hair first as usual with your normal shampoo.  3. After you shampoo, rinse your hair and body thoroughly to remove the shampoo.  4. Use CHG as you would any other liquid soap. You can apply CHG directly to the skin and wash gently with a scrungie or a clean washcloth.   5. Apply the CHG Soap to your body ONLY FROM THE NECK DOWN.  Do not use on open wounds or open sores. Avoid contact with your eyes, ears, mouth and genitals (private parts). Wash Face and genitals (private parts)  with your normal soap.  6. Wash thoroughly, paying special attention to the area where your surgery will be performed.  7. Thoroughly rinse your body with warm water from the neck down.  8. DO NOT shower/wash with your normal soap after using and rinsing off the CHG Soap.  9. Pat yourself dry with a CLEAN TOWEL.  10. Wear CLEAN PAJAMAS to bed the night before surgery, wear comfortable clothes the morning of surgery  11. Place CLEAN SHEETS on your bed the night of your first shower and DO NOT SLEEP WITH PETS.    Day of Surgery:  Do not apply any deodorants/lotions.  Please wear clean clothes to the  hospital/surgery center.   Remember to brush your teeth WITH YOUR REGULAR TOOTHPASTE.    Please read over the following fact sheets that you were given. Coughing and Deep Breathing and Surgical Site Infection Prevention

## 2018-02-21 ENCOUNTER — Encounter (HOSPITAL_COMMUNITY): Payer: Self-pay | Admitting: Physician Assistant

## 2018-02-21 ENCOUNTER — Other Ambulatory Visit: Payer: Self-pay

## 2018-02-21 ENCOUNTER — Encounter (HOSPITAL_COMMUNITY)
Admission: RE | Admit: 2018-02-21 | Discharge: 2018-02-21 | Disposition: A | Discharge status: Home / Self Care | Payer: Federal, State, Local not specified - PPO | Source: Ambulatory Visit | Attending: General Surgery | Admitting: General Surgery

## 2018-02-21 ENCOUNTER — Encounter (HOSPITAL_COMMUNITY): Payer: Self-pay

## 2018-02-21 DIAGNOSIS — I10 Essential (primary) hypertension: Secondary | ICD-10-CM | POA: Diagnosis not present

## 2018-02-21 DIAGNOSIS — Z01810 Encounter for preprocedural cardiovascular examination: Secondary | ICD-10-CM | POA: Insufficient documentation

## 2018-02-21 DIAGNOSIS — Z01812 Encounter for preprocedural laboratory examination: Secondary | ICD-10-CM | POA: Diagnosis not present

## 2018-02-21 LAB — BASIC METABOLIC PANEL
Anion gap: 7 (ref 5–15)
BUN: 8 mg/dL (ref 6–20)
CO2: 26 mmol/L (ref 22–32)
Calcium: 9.8 mg/dL (ref 8.9–10.3)
Chloride: 107 mmol/L (ref 98–111)
Creatinine, Ser: 0.86 mg/dL (ref 0.44–1.00)
GFR calc Af Amer: >60 mL/min (ref >60)
GFR calc non Af Amer: >60 mL/min (ref >60)
GLUCOSE: 92 mg/dL (ref 70–99)
Potassium: 3.5 mmol/L (ref 3.5–5.1)
Sodium: 140 mmol/L (ref 135–145)

## 2018-02-21 LAB — HEMOGLOBIN A1C
Hgb A1c MFr Bld: 5.6 % (ref 4.8–5.6)
MEAN PLASMA GLUCOSE: 114.02 mg/dL

## 2018-02-21 LAB — CBC
HEMATOCRIT: 42.2 % (ref 36.0–46.0)
Hemoglobin: 13.3 g/dL (ref 12.0–15.0)
MCH: 28.5 pg (ref 26.0–34.0)
MCHC: 31.5 g/dL (ref 30.0–36.0)
MCV: 90.6 fL (ref 78.0–100.0)
Platelets: 265 10*3/uL (ref 150–400)
RBC: 4.66 MIL/uL (ref 3.87–5.11)
RDW: 12.7 % (ref 11.5–15.5)
WBC: 6 10*3/uL (ref 4.0–10.5)

## 2018-02-21 NOTE — Progress Notes (Signed)
PCP - Dr. Steele Sizer Cardiologist - denies  Chest x-ray - N/A EKG - 02/21/2018 Stress Test - 5+ years ago ECHO - denies Cardiac Cath - denies  Sleep Study - denies  Currently does not checks blood sugar. Has A1C checked periodically. Last A1C was 5.1 in 09/2017.   Verified with Abigail Butts at [facility] about order for fleets enema. Pt is to do a fleets enema the night before surgery and the morning of surgery.   Anesthesia review: No  Patient denies shortness of breath, fever, cough and chest pain at PAT appointment   Patient verbalized understanding of instructions that were given to them at the PAT appointment. Patient was also instructed that they will need to review over the PAT instructions again at home before surgery.

## 2018-02-22 DIAGNOSIS — K625 Hemorrhage of anus and rectum: Secondary | ICD-10-CM | POA: Diagnosis not present

## 2018-02-22 DIAGNOSIS — K5904 Chronic idiopathic constipation: Secondary | ICD-10-CM | POA: Diagnosis not present

## 2018-02-22 DIAGNOSIS — R634 Abnormal weight loss: Secondary | ICD-10-CM | POA: Diagnosis not present

## 2018-02-22 DIAGNOSIS — Z1211 Encounter for screening for malignant neoplasm of colon: Secondary | ICD-10-CM | POA: Diagnosis not present

## 2018-02-28 ENCOUNTER — Ambulatory Visit (HOSPITAL_COMMUNITY)
Admission: RE | Admit: 2018-02-28 | Payer: Federal, State, Local not specified - PPO | Source: Ambulatory Visit | Admitting: General Surgery

## 2018-02-28 ENCOUNTER — Encounter (HOSPITAL_COMMUNITY): Admission: RE | Payer: Self-pay | Source: Ambulatory Visit

## 2018-02-28 SURGERY — HEMORRHOIDECTOMY
Anesthesia: General

## 2018-03-02 ENCOUNTER — Encounter: Payer: Self-pay | Admitting: Family Medicine

## 2018-03-02 DIAGNOSIS — K5900 Constipation, unspecified: Secondary | ICD-10-CM | POA: Diagnosis not present

## 2018-03-02 DIAGNOSIS — R634 Abnormal weight loss: Secondary | ICD-10-CM | POA: Diagnosis not present

## 2018-03-02 DIAGNOSIS — K625 Hemorrhage of anus and rectum: Secondary | ICD-10-CM | POA: Diagnosis not present

## 2018-03-02 DIAGNOSIS — Z1211 Encounter for screening for malignant neoplasm of colon: Secondary | ICD-10-CM | POA: Diagnosis not present

## 2018-03-02 DIAGNOSIS — K5904 Chronic idiopathic constipation: Secondary | ICD-10-CM | POA: Diagnosis not present

## 2018-03-07 DIAGNOSIS — K648 Other hemorrhoids: Secondary | ICD-10-CM | POA: Diagnosis not present

## 2018-03-07 DIAGNOSIS — K644 Residual hemorrhoidal skin tags: Secondary | ICD-10-CM | POA: Diagnosis not present

## 2018-04-04 DIAGNOSIS — K644 Residual hemorrhoidal skin tags: Secondary | ICD-10-CM | POA: Diagnosis not present

## 2018-04-04 DIAGNOSIS — K649 Unspecified hemorrhoids: Secondary | ICD-10-CM | POA: Diagnosis not present

## 2018-04-04 DIAGNOSIS — K648 Other hemorrhoids: Secondary | ICD-10-CM | POA: Diagnosis not present

## 2018-04-04 HISTORY — PX: HEMORROIDECTOMY: SUR656

## 2018-04-21 ENCOUNTER — Encounter
Payer: Federal, State, Local not specified - PPO | Admitting: Student in an Organized Health Care Education/Training Program

## 2018-04-27 ENCOUNTER — Encounter
Payer: Federal, State, Local not specified - PPO | Admitting: Student in an Organized Health Care Education/Training Program

## 2018-04-27 ENCOUNTER — Encounter: Payer: Self-pay | Admitting: Family Medicine

## 2018-04-27 ENCOUNTER — Ambulatory Visit: Payer: Federal, State, Local not specified - PPO | Admitting: Family Medicine

## 2018-04-27 ENCOUNTER — Other Ambulatory Visit: Payer: Self-pay | Admitting: Family Medicine

## 2018-04-27 VITALS — BP 100/80 | HR 100 | Temp 97.9°F | Resp 16 | Ht 66.0 in | Wt 156.3 lb

## 2018-04-27 DIAGNOSIS — F339 Major depressive disorder, recurrent, unspecified: Secondary | ICD-10-CM | POA: Diagnosis not present

## 2018-04-27 DIAGNOSIS — I10 Essential (primary) hypertension: Secondary | ICD-10-CM

## 2018-04-27 DIAGNOSIS — R7303 Prediabetes: Secondary | ICD-10-CM

## 2018-04-27 DIAGNOSIS — E8881 Metabolic syndrome: Secondary | ICD-10-CM

## 2018-04-27 DIAGNOSIS — K5901 Slow transit constipation: Secondary | ICD-10-CM

## 2018-04-27 DIAGNOSIS — E041 Nontoxic single thyroid nodule: Secondary | ICD-10-CM

## 2018-04-27 DIAGNOSIS — G4700 Insomnia, unspecified: Secondary | ICD-10-CM

## 2018-04-27 DIAGNOSIS — E559 Vitamin D deficiency, unspecified: Secondary | ICD-10-CM

## 2018-04-27 DIAGNOSIS — F411 Generalized anxiety disorder: Secondary | ICD-10-CM | POA: Diagnosis not present

## 2018-04-27 MED ORDER — HYDROCHLOROTHIAZIDE 12.5 MG PO TABS: 12.5000 mg | ORAL_TABLET | Freq: Every day | ORAL | 1 refills | Status: DC

## 2018-04-27 MED ORDER — VITAMIN D (ERGOCALCIFEROL) 1.25 MG (50000 UNIT) PO CAPS: ORAL_CAPSULE | ORAL | 0 refills | Status: DC

## 2018-04-27 MED ORDER — LINACLOTIDE 72 MCG PO CAPS: 72.0000 ug | ORAL_CAPSULE | Freq: Every day | ORAL | 0 refills | Status: DC

## 2018-04-27 MED ORDER — SEMAGLUTIDE (1 MG/DOSE) 2 MG/1.5ML ~~LOC~~ SOPN: 1.0000 mg | PEN_INJECTOR | SUBCUTANEOUS | 1 refills | Status: DC

## 2018-04-27 MED ORDER — DULOXETINE HCL 60 MG PO CPEP: 120.0000 mg | ORAL_CAPSULE | Freq: Every day | ORAL | 1 refills | Status: DC

## 2018-04-27 MED ORDER — METFORMIN HCL ER 750 MG PO TB24: 750.0000 mg | ORAL_TABLET | Freq: Every day | ORAL | 1 refills | Status: DC

## 2018-04-27 MED ORDER — TEMAZEPAM 15 MG PO CAPS: 15.0000 mg | ORAL_CAPSULE | Freq: Every evening | ORAL | 0 refills | Status: DC | PRN
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
## 2018-04-27 NOTE — Addendum Note (Signed)
Addended by: Steele Sizer F on: 04/27/2018 10:44 AM   Modules accepted: Orders

## 2018-05-03 ENCOUNTER — Other Ambulatory Visit: Payer: Self-pay | Admitting: Family Medicine

## 2018-05-03 ENCOUNTER — Encounter: Payer: Self-pay | Admitting: Student in an Organized Health Care Education/Training Program

## 2018-05-03 ENCOUNTER — Ambulatory Visit
Payer: Federal, State, Local not specified - PPO | Attending: Student in an Organized Health Care Education/Training Program | Admitting: Student in an Organized Health Care Education/Training Program

## 2018-05-03 VITALS — BP 108/75 | HR 83 | Temp 97.6°F | Resp 16 | Ht 65.0 in | Wt 150.0 lb

## 2018-05-03 DIAGNOSIS — M5412 Radiculopathy, cervical region: Secondary | ICD-10-CM | POA: Insufficient documentation

## 2018-05-03 DIAGNOSIS — Z79899 Other long term (current) drug therapy: Secondary | ICD-10-CM | POA: Diagnosis not present

## 2018-05-03 DIAGNOSIS — Z79891 Long term (current) use of opiate analgesic: Secondary | ICD-10-CM | POA: Insufficient documentation

## 2018-05-03 DIAGNOSIS — F329 Major depressive disorder, single episode, unspecified: Secondary | ICD-10-CM | POA: Diagnosis not present

## 2018-05-03 DIAGNOSIS — F320 Major depressive disorder, single episode, mild: Secondary | ICD-10-CM | POA: Diagnosis not present

## 2018-05-03 DIAGNOSIS — Z760 Encounter for issue of repeat prescription: Secondary | ICD-10-CM | POA: Diagnosis not present

## 2018-05-03 DIAGNOSIS — K219 Gastro-esophageal reflux disease without esophagitis: Secondary | ICD-10-CM | POA: Insufficient documentation

## 2018-05-03 DIAGNOSIS — F419 Anxiety disorder, unspecified: Secondary | ICD-10-CM | POA: Diagnosis not present

## 2018-05-03 DIAGNOSIS — F429 Obsessive-compulsive disorder, unspecified: Secondary | ICD-10-CM | POA: Diagnosis not present

## 2018-05-03 DIAGNOSIS — E041 Nontoxic single thyroid nodule: Secondary | ICD-10-CM | POA: Diagnosis not present

## 2018-05-03 DIAGNOSIS — Z7984 Long term (current) use of oral hypoglycemic drugs: Secondary | ICD-10-CM | POA: Diagnosis not present

## 2018-05-03 DIAGNOSIS — E538 Deficiency of other specified B group vitamins: Secondary | ICD-10-CM | POA: Insufficient documentation

## 2018-05-03 DIAGNOSIS — E119 Type 2 diabetes mellitus without complications: Secondary | ICD-10-CM | POA: Diagnosis not present

## 2018-05-03 DIAGNOSIS — K5904 Chronic idiopathic constipation: Secondary | ICD-10-CM | POA: Insufficient documentation

## 2018-05-03 DIAGNOSIS — Z5181 Encounter for therapeutic drug level monitoring: Secondary | ICD-10-CM | POA: Insufficient documentation

## 2018-05-03 DIAGNOSIS — R102 Pelvic and perineal pain: Secondary | ICD-10-CM

## 2018-05-03 DIAGNOSIS — N809 Endometriosis, unspecified: Secondary | ICD-10-CM

## 2018-05-03 DIAGNOSIS — Z791 Long term (current) use of non-steroidal anti-inflammatories (NSAID): Secondary | ICD-10-CM | POA: Insufficient documentation

## 2018-05-03 DIAGNOSIS — G8929 Other chronic pain: Secondary | ICD-10-CM

## 2018-05-03 DIAGNOSIS — N946 Dysmenorrhea, unspecified: Secondary | ICD-10-CM | POA: Diagnosis not present

## 2018-05-03 DIAGNOSIS — I10 Essential (primary) hypertension: Secondary | ICD-10-CM | POA: Insufficient documentation

## 2018-05-03 DIAGNOSIS — F17200 Nicotine dependence, unspecified, uncomplicated: Secondary | ICD-10-CM

## 2018-05-03 DIAGNOSIS — F17210 Nicotine dependence, cigarettes, uncomplicated: Secondary | ICD-10-CM | POA: Insufficient documentation

## 2018-05-03 DIAGNOSIS — F5104 Psychophysiologic insomnia: Secondary | ICD-10-CM | POA: Insufficient documentation

## 2018-05-03 DIAGNOSIS — Z90710 Acquired absence of both cervix and uterus: Secondary | ICD-10-CM | POA: Diagnosis not present

## 2018-05-03 DIAGNOSIS — G894 Chronic pain syndrome: Secondary | ICD-10-CM | POA: Diagnosis not present

## 2018-05-03 MED ORDER — TAPENTADOL HCL 75 MG PO TABS: 75.0000 mg | ORAL_TABLET | Freq: Every day | ORAL | 0 refills | Status: AC | PRN

## 2018-05-03 NOTE — Progress Notes (Signed)
Nursing Pain Medication Assessment:  Safety precautions to be maintained throughout the outpatient stay will include: orient to surroundings, keep bed in low position, maintain call bell within reach at all times, provide assistance with transfer out of bed and ambulation.  Medication Inspection Compliance: Ms. Debo did not comply with our request to bring her pills to be counted. She was reminded that bringing the medication bottles, even when empty, is a requirement.  Medication: None brought in. Pill/Patch Count: None available to be counted. Bottle Appearance: No container available. Did not bring bottle(s) to appointment. Filled Date: N/A Last Medication intake:  ran out in october

## 2018-05-03 NOTE — Patient Instructions (Signed)
tapentadol HCl (NUCYNTA) 75 MG tablet ready for pick up and good for 07/04/2018.
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
## 2018-05-20 ENCOUNTER — Ambulatory Visit: Payer: Federal, State, Local not specified - PPO | Admitting: Family Medicine

## 2018-06-09 ENCOUNTER — Telehealth: Payer: Self-pay | Admitting: Family Medicine

## 2018-06-09 NOTE — Telephone Encounter (Signed)
Copied from [place] [phone]. Topic: Quick Communication - See Telephone Encounter >> Jun 09, 2018  4:06 PM Blase Mess A wrote: CRM for notification. See Telephone encounter for: 06/09/18.  Patient is calling to see if she can get a written statement showing that she had her flu shot.  She is requesting it via mail [place] [id number]

## 2018-06-10 NOTE — Telephone Encounter (Signed)
Done. Durene Cal out today.

## 2018-07-15 ENCOUNTER — Telehealth: Payer: Self-pay

## 2018-07-15 NOTE — Telephone Encounter (Signed)
Called patient, lvm for her to schedule appointment for referral.

## 2018-07-15 NOTE — Telephone Encounter (Signed)
Copied from [place] [phone]. Topic: Referral - Request for Referral >> Jul 15, 2018  8:33 AM Virl Axe D wrote: Has patient seen PCP for this complaint? No *If NO, is insurance requiring patient see PCP for this issue before PCP can refer them? Referral for which specialty: Ultrasound Preferred provider/office: N/A Reason for referral: Pt is having pain on left side of abdomen and around her back. Pt thinks she may have a complex mass that she has had in the past.  Pt stated it is okay to leave vm.

## 2018-07-15 NOTE — Telephone Encounter (Signed)
She needs to be seen.

## 2018-07-19 ENCOUNTER — Other Ambulatory Visit: Payer: Self-pay

## 2018-07-19 ENCOUNTER — Encounter (HOSPITAL_COMMUNITY): Payer: Self-pay | Admitting: Emergency Medicine

## 2018-07-19 DIAGNOSIS — Z5321 Procedure and treatment not carried out due to patient leaving prior to being seen by health care provider: Secondary | ICD-10-CM | POA: Insufficient documentation

## 2018-07-19 DIAGNOSIS — R109 Unspecified abdominal pain: Secondary | ICD-10-CM | POA: Insufficient documentation

## 2018-07-19 LAB — CBC
HEMATOCRIT: 42.5 % (ref 36.0–46.0)
HEMOGLOBIN: 13.5 g/dL (ref 12.0–15.0)
MCH: 29.2 pg (ref 26.0–34.0)
MCHC: 31.8 g/dL (ref 30.0–36.0)
MCV: 92 fL (ref 80.0–100.0)
Platelets: 307 10*3/uL (ref 150–400)
RBC: 4.62 MIL/uL (ref 3.87–5.11)
RDW: 12.6 % (ref 11.5–15.5)
WBC: 6.6 10*3/uL (ref 4.0–10.5)
nRBC: 0 % (ref 0.0–0.2)

## 2018-07-19 LAB — COMPREHENSIVE METABOLIC PANEL
ALBUMIN: 4.2 g/dL (ref 3.5–5.0)
ALT: 9 U/L (ref 0–44)
ANION GAP: 7 (ref 5–15)
AST: 14 U/L — ABNORMAL LOW (ref 15–41)
Alkaline Phosphatase: 39 U/L (ref 38–126)
BILIRUBIN TOTAL: 0.3 mg/dL (ref 0.3–1.2)
BUN: 13 mg/dL (ref 6–20)
CHLORIDE: 104 mmol/L (ref 98–111)
CO2: 28 mmol/L (ref 22–32)
Calcium: 9.7 mg/dL (ref 8.9–10.3)
Creatinine, Ser: 0.72 mg/dL (ref 0.44–1.00)
GFR calc Af Amer: >60 mL/min (ref >60)
GFR calc non Af Amer: >60 mL/min (ref >60)
GLUCOSE: 91 mg/dL (ref 70–99)
POTASSIUM: 3.6 mmol/L (ref 3.5–5.1)
SODIUM: 139 mmol/L (ref 135–145)
TOTAL PROTEIN: 6.8 g/dL (ref 6.5–8.1)

## 2018-07-19 LAB — I-STAT BETA HCG BLOOD, ED (MC, WL, AP ONLY)

## 2018-07-19 LAB — LIPASE, BLOOD: LIPASE: 35 U/L (ref 11–51)

## 2018-07-19 MED ORDER — SODIUM CHLORIDE 0.9% FLUSH: 3.0000 mL | Freq: Once | INTRAVENOUS | Status: DC

## 2018-07-19 NOTE — ED Triage Notes (Signed)
Patient c/o LLQ abdominal pain radiating to lower back x1 month. Denies N/V/D.

## 2018-07-20 ENCOUNTER — Emergency Department (HOSPITAL_COMMUNITY)
Admission: EM | Admit: 2018-07-20 | Discharge: 2018-07-20 | Disposition: A | Discharge status: Home / Self Care | Payer: Federal, State, Local not specified - PPO | Attending: Emergency Medicine | Admitting: Emergency Medicine

## 2018-07-20 NOTE — ED Notes (Signed)
Pt called to have vitals reassessed.  RN notified.

## 2018-08-01 ENCOUNTER — Ambulatory Visit: Payer: Federal, State, Local not specified - PPO | Admitting: Family Medicine

## 2018-08-01 ENCOUNTER — Other Ambulatory Visit: Payer: Self-pay | Admitting: Nurse Practitioner

## 2018-08-01 ENCOUNTER — Encounter: Payer: Self-pay | Admitting: Nurse Practitioner

## 2018-08-01 ENCOUNTER — Encounter
Payer: Federal, State, Local not specified - PPO | Admitting: Student in an Organized Health Care Education/Training Program

## 2018-08-01 ENCOUNTER — Ambulatory Visit: Payer: Federal, State, Local not specified - PPO | Admitting: Nurse Practitioner

## 2018-08-01 VITALS — BP 116/78 | HR 85 | Temp 98.3°F | Resp 16 | Ht 65.0 in | Wt 159.0 lb

## 2018-08-01 DIAGNOSIS — E559 Vitamin D deficiency, unspecified: Secondary | ICD-10-CM

## 2018-08-01 DIAGNOSIS — R1033 Periumbilical pain: Secondary | ICD-10-CM

## 2018-08-01 DIAGNOSIS — K5904 Chronic idiopathic constipation: Secondary | ICD-10-CM | POA: Diagnosis not present

## 2018-08-01 DIAGNOSIS — R7303 Prediabetes: Secondary | ICD-10-CM

## 2018-08-01 DIAGNOSIS — E8881 Metabolic syndrome: Secondary | ICD-10-CM

## 2018-08-01 DIAGNOSIS — G4700 Insomnia, unspecified: Secondary | ICD-10-CM

## 2018-08-01 DIAGNOSIS — F339 Major depressive disorder, recurrent, unspecified: Secondary | ICD-10-CM | POA: Diagnosis not present

## 2018-08-01 DIAGNOSIS — E538 Deficiency of other specified B group vitamins: Secondary | ICD-10-CM

## 2018-08-01 DIAGNOSIS — K648 Other hemorrhoids: Secondary | ICD-10-CM | POA: Diagnosis not present

## 2018-08-01 DIAGNOSIS — I10 Essential (primary) hypertension: Secondary | ICD-10-CM

## 2018-08-01 DIAGNOSIS — F411 Generalized anxiety disorder: Secondary | ICD-10-CM

## 2018-08-01 MED ORDER — VITAMIN D (ERGOCALCIFEROL) 1.25 MG (50000 UNIT) PO CAPS: ORAL_CAPSULE | ORAL | 0 refills | Status: DC

## 2018-08-01 MED ORDER — METFORMIN HCL ER 750 MG PO TB24: 750.0000 mg | ORAL_TABLET | Freq: Every day | ORAL | 0 refills | Status: DC

## 2018-08-01 MED ORDER — HYDROCHLOROTHIAZIDE 12.5 MG PO TABS: 12.5000 mg | ORAL_TABLET | Freq: Every day | ORAL | 0 refills | Status: DC

## 2018-08-01 MED ORDER — DULOXETINE HCL 60 MG PO CPEP: 120.0000 mg | ORAL_CAPSULE | Freq: Every day | ORAL | 0 refills | Status: DC

## 2018-08-01 MED ORDER — TEMAZEPAM 15 MG PO CAPS: 15.0000 mg | ORAL_CAPSULE | Freq: Every evening | ORAL | 0 refills | Status: DC | PRN

## 2018-08-01 NOTE — Progress Notes (Signed)
Name: Stacey Stanley   MRN: [id number]    DOB: [birth year]   Date:08/01/2018       Progress Note  Subjective  Chief Complaint  Chief Complaint  Patient presents with  . Follow-up     3 month f/u  . Medication Refill    all meds    HPI  Hypertension: pt is rx HCTZ 12.5mg  daily.  Denies chest pain, blurry vision, headaches.  BP Readings from Last 3 Encounters:  08/01/18 116/78  07/19/18 (!) 133/95  05/03/18 108/75   Constipation: patient is rx linzess 35mcg daily- not taking- it did not help; was seeing GI at [facility] completed banding then followed by hemorrhoidectomy- but no longer following with them. Sees Dr. Collene Mares- here for GI. Miralax daily, has BM every 5 days or so.   Insomnia: takes temazepam PRN; says take bout 10 a month.   Major Depression: takes cymbalta 120mg  daily, no issues with this medicine. More anxiety due to life stress.  Depression screen [facility] 2/9 08/01/2018 04/27/2018 02/01/2018 01/26/2018 01/26/2018  Decreased Interest 0 - 0 0 0  Down, Depressed, Hopeless 0 - 0 0 0  PHQ - 2 Score 0 - 0 0 0  Altered sleeping 0 2 - - 0  Tired, decreased energy 0 1 - - 0  Change in appetite 0 0 - - 0  Feeling bad or failure about yourself  0 0 - - 0  Trouble concentrating 2 0 - - 0  Moving slowly or fidgety/restless 0 0 - - 0  Suicidal thoughts 0 0 - - 0  PHQ-9 Score 2 - - - 0  Difficult doing work/chores Not difficult at all Somewhat difficult - - -  Some recent data might be hidden   GAD 7 : Generalized Anxiety Score 08/01/2018 01/26/2018 11/08/2017 10/26/2017  Nervous, Anxious, on Edge 3 1 3 2   Control/stop worrying 3 0 3 1  Worry too much - different things 3 0 3 1  Trouble relaxing 3 0 3 1  Restless 1 0 1 0  Easily annoyed or irritable 0 0 1 1  Afraid - awful might happen 0 0 3 3  Total GAD 7 Score 13 1 17 9   Anxiety Difficulty Somewhat difficult Not difficult at all - -    Metabolic syndrome: takes metformin daily, no issues with this medication, denies nausea,  vomiting diarrhea.  Wt Readings from Last 3 Encounters:  08/01/18 159 lb (72.1 kg)  07/19/18 150 lb (68 kg)  05/03/18 150 lb (68 kg)   Vitamin D: takes 50,000 IU weekly.   B12 deficiency: takes 1,043mcg sublingual.   Endorses some mid abdominal pain, thinks it is related to constipation, no nausea, vomiting, diarrhea, think its related to constipation. Plans to make appointment with GI. Reviewed ER labs with patient. Non-tender, no fever. Not associated with eating, no dysuria or vaginal discharge.  Patient Active Problem List   Diagnosis Date Noted  . Chronic pain syndrome 12/28/2017  . Opiate use 12/28/2017  . Long term prescription benzodiazepine use 12/28/2017  . Thrombocytosis ([place]) 10/26/2017  . Internal hemorrhoids 10/13/2017  . Chronic idiopathic constipation 10/13/2017  . Rectal bleed 09/10/2017  . Hypomagnesemia 09/10/2017  . Pelvic pain 09/09/2017  . Mass of left ovary 06/28/2017  . Kidney stone on left side 06/28/2017  . Cervical radiculitis 03/08/2017  . Tobacco dependence 10/28/2016  . Vitamin B12 deficiency 08/19/2016  . Prediabetes 08/19/2016  . Right thyroid nodule 07/31/2015  . Chronic pelvic pain in  female 04/05/2015  . GERD (gastroesophageal reflux disease) 04/05/2015  . Mild major depression ([place]) 11/25/2014  . Insomnia, persistent 11/25/2014  . Endometriosis 11/25/2014  . Essential (primary) hypertension 11/25/2014  . Dysmetabolic syndrome 60/63/0160  . Obesity (BMI 30-39.9) 11/25/2014    Past Medical History:  Diagnosis Date  . Anxiety    no current meds  . Arthritis    left knee pain - otc med prn  . Chronic insomnia   . Depression   . Diabetes mellitus without complication ([place])   . Dysrhythmia    Home with daughter [name]  . Endometriosis   . GERD (gastroesophageal reflux disease)    diet control - no meds  . Headache(784.0)    hx - last one 2 yrs ago - no meds  . Hypertension   . Kidney stones   . Metabolic syndrome   .  Obesity   . Serum calcium elevated   . Sleep disorder, circadian, shift work type   . SVD (spontaneous vaginal delivery)    x 2  . Tachycardia    History - neg cardiac tests-stress related    Past Surgical History:  Procedure Laterality Date  . ABDOMINAL HYSTERECTOMY  05/21/2016  . DILATION AND CURETTAGE OF UTERUS    . EYE SURGERY  06/28/13   right laser eye surgery - repair retina  . HEMORROIDECTOMY  04/04/2018   Dr. Levin Bacon   . HYSTEROSCOPY W/D&C N/A 07/19/2013   Procedure: DILATATION AND CURETTAGE /HYSTEROSCOPY;  Surgeon: Marvene Staff, MD;  Location: [facility];  Service: Gynecology;  Laterality: N/A;  . INDUCED ABORTION    . LAPAROSCOPY  1992   endometriosis  . LAPAROSCOPY N/A 07/19/2013   Procedure: LAPAROSCOPY DIAGNOSTIC  with resection of endometriosis;  Surgeon: Marvene Staff, MD;  Location: [facility];  Service: Gynecology;  Laterality: N/A;  . metatarsil   2003   left foot surgery     Social History   Tobacco Use  . Smoking status: Current Every Day Smoker    Packs/day: 0.50    Years: 24.00    Pack years: 12.00    Types: Cigarettes    Start date: 11/25/1993  . Smokeless tobacco: Never Used  Substance Use Topics  . Alcohol use: No    Alcohol/week: 0.0 standard drinks     Current Outpatient Medications:  .  acetaminophen (TYLENOL) 500 MG tablet, Take 1,000 mg by mouth every 8 (eight) hours as needed., Disp: , Rfl:  .  Cyanocobalamin (B-12) 1000 MCG SUBL, Place 1 mL under the tongue daily. (Patient taking differently: Place 1,000 Units under the tongue daily. ), Disp: 30 each, Rfl: 0 .  DULoxetine (CYMBALTA) 60 MG capsule, Take 2 capsules (120 mg total) by mouth daily., Disp: 180 capsule, Rfl: 1 .  hydrochlorothiazide (HYDRODIURIL) 12.5 MG tablet, Take 1 tablet (12.5 mg total) by mouth daily., Disp: 90 tablet, Rfl: 1 .  ibuprofen (ADVIL,MOTRIN) 800 MG tablet, Take 800 mg by mouth every 8 (eight) hours as needed., Disp: , Rfl:  .  metFORMIN (GLUCOPHAGE XR)  750 MG 24 hr tablet, Take 1 tablet (750 mg total) by mouth daily with breakfast., Disp: 90 tablet, Rfl: 1 .  Semaglutide, 1 MG/DOSE, (OZEMPIC, 1 MG/DOSE,) 2 MG/1.5ML SOPN, Inject 1 mg into the skin once a week., Disp: 27 mL, Rfl: 1 .  tapentadol HCl (NUCYNTA) 75 MG tablet, Take 1 tablet (75 mg total) by mouth daily as needed. Do not take if you have epilepsy or a history of  seizures., Disp: 30 tablet, Rfl: 0 .  temazepam (RESTORIL) 15 MG capsule, Take 1 capsule (15 mg total) by mouth at bedtime as needed for sleep., Disp: 30 capsule, Rfl: 0 .  Vitamin D, Ergocalciferol, (DRISDOL) 1.25 MG (50000 UT) CAPS capsule, TAKE 1 CAPSULE BY MOUTH 1 TIME A WEEK, Disp: 13 capsule, Rfl: 0  Allergies  Allergen Reactions  . Nuvigil [Armodafinil]     Chest pain     ROS   No other specific complaints in a complete review of systems (except as listed in HPI above).  Objective  Vitals:   08/01/18 0846  BP: 116/78  Pulse: 85  Resp: 16  Temp: 98.3 F (36.8 C)  TempSrc: Oral  SpO2: 96%  Weight: 159 lb (72.1 kg)  Height: 5\' 5"  (1.651 m)     Body mass index is 26.46 kg/m.  Nursing Note and Vital Signs reviewed.  Physical Exam Vitals signs reviewed.  Constitutional:      Appearance: She is well-developed.  HENT:     Head: Normocephalic and atraumatic.  Neck:     Musculoskeletal: Normal range of motion and neck supple.     Vascular: No carotid bruit.  Cardiovascular:     Heart sounds: Normal heart sounds.  Pulmonary:     Effort: Pulmonary effort is normal.     Breath sounds: Normal breath sounds.  Abdominal:     General: Bowel sounds are normal.     Palpations: Abdomen is soft. There is no mass.     Tenderness: There is no abdominal tenderness. There is no right CVA tenderness or left CVA tenderness.  Musculoskeletal: Normal range of motion.  Skin:    General: Skin is warm and dry.     Capillary Refill: Capillary refill takes less than 2 seconds.  Neurological:     Mental Status:  She is alert and oriented to person, place, and time.     GCS: GCS eye subscore is 4. GCS verbal subscore is 5. GCS motor subscore is 6.     Sensory: No sensory deficit.  Psychiatric:        Speech: Speech normal.        Behavior: Behavior normal.        Thought Content: Thought content normal.        Judgment: Judgment normal.       No results found for this or any previous visit (from the past 48 hour(s)).  Assessment & Plan 1. Major depression, recurrent, chronic (HCC) Well controlled on medicine, declines counseling/psychiatry at this time.  - DULoxetine (CYMBALTA) 60 MG capsule; Take 2 capsules (120 mg total) by mouth daily.  Dispense: 180 capsule; Refill: 0  2. Internal hemorrhoids Resolved with surgery, follows up with Dr. Collene Mares now.   3. Chronic idiopathic constipation Taking miralax daily, stopped linzess due to price and ineffective, warned on daily laxative use- follow up with GI  4. Dysmetabolic syndrome - Lipid Profile - metFORMIN (GLUCOPHAGE XR) 750 MG 24 hr tablet; Take 1 tablet (750 mg total) by mouth daily with breakfast.  Dispense: 90 tablet; Refill: 0  5. Insomnia, persistent Reviewed PMP; see lateef for chronic pain, short term narcotics rx after surgery- appropriat.  - temazepam (RESTORIL) 15 MG capsule; Take 1 capsule (15 mg total) by mouth at bedtime as needed for sleep.  Dispense: 30 capsule; Refill: 0  7. Prediabetes Last a1c 5.6  8. Vitamin D deficiency Recheck levels to ensure proper rx amount  - Vitamin D (25 hydroxy) -  Vitamin D, Ergocalciferol, (DRISDOL) 1.25 MG (50000 UT) CAPS capsule; TAKE 1 CAPSULE BY MOUTH 1 TIME A WEEK  Dispense: 12 capsule; Refill: 0  9. Vitamin B12 deficiency Continue supplementation - B12  10. Hypertension, benign Well controlled continue medications, DASH - hydrochlorothiazide (HYDRODIURIL) 12.5 MG tablet; Take 1 tablet (12.5 mg total) by mouth daily.  Dispense: 90 tablet; Refill: 0  11. GAD (generalized anxiety  disorder) situationally increased, states she thinks it will get better declines counseling, psychiatry presently. No panic attacks - DULoxetine (CYMBALTA) 60 MG capsule; Take 2 capsules (120 mg total) by mouth daily.  Dispense: 180 capsule; Refill: 0  12. Periumbilical abdominal pain Follow up with GI, trial H2 blocker or antacid to see if GERD related, labs from ER reviewed with patient- states she would like to just monitor and follow up with GI as needed.   -Red flags and when to present for emergency care or RTC including fever >101.34F, chest pain, shortness of breath, new/worsening/un-resolving symptoms, reviewed with patient at time of visit. Follow up and care instructions discussed and provided in AVS.

## 2018-08-02 ENCOUNTER — Ambulatory Visit: Payer: Federal, State, Local not specified - PPO | Admitting: Family Medicine

## 2018-08-02 LAB — LIPID PANEL
CHOL/HDL RATIO: 1.9 (calc) (ref <5.0)
CHOLESTEROL: 138 mg/dL (ref <200)
HDL: 71 mg/dL (ref >=50)
LDL CHOLESTEROL (CALC): 50 mg/dL
Non-HDL Cholesterol (Calc): 67 mg/dL (calc) (ref <130)
TRIGLYCERIDES: 87 mg/dL (ref <150)

## 2018-08-02 LAB — VITAMIN D 25 HYDROXY (VIT D DEFICIENCY, FRACTURES): Vit D, 25-Hydroxy: 34 ng/mL (ref 30–100)

## 2018-08-02 LAB — VITAMIN B12: Vitamin B-12: 440 pg/mL (ref 200–1100)

## 2018-08-03 ENCOUNTER — Other Ambulatory Visit: Payer: Self-pay

## 2018-08-03 DIAGNOSIS — G4700 Insomnia, unspecified: Secondary | ICD-10-CM

## 2018-08-03 NOTE — Telephone Encounter (Signed)
Refill request for general medication: Temazepam 15 mg   Prior Authorization was initiated for Temazepam 15 mg and Approved. The medication is approved through 07/03/2018-030/07/2019.

## 2018-08-03 NOTE — Telephone Encounter (Signed)
She was given rx after procedure. Per Suezanne Cheshire, is she still taking it every night?

## 2018-08-04 NOTE — Telephone Encounter (Signed)
Left a message for patient to call us back about how she takes Temazepam since it was given for short term use and does she still need it?

## 2018-08-05 NOTE — Telephone Encounter (Signed)
She says she takes medication as needed for insomnia and that she would like to continue to take this medication. She states she would be open to another medication, if needed.

## 2018-08-10 ENCOUNTER — Other Ambulatory Visit: Payer: Self-pay

## 2018-08-10 ENCOUNTER — Ambulatory Visit
Payer: Federal, State, Local not specified - PPO | Attending: Student in an Organized Health Care Education/Training Program | Admitting: Student in an Organized Health Care Education/Training Program

## 2018-08-10 ENCOUNTER — Encounter: Payer: Self-pay | Admitting: Student in an Organized Health Care Education/Training Program

## 2018-08-10 VITALS — BP 120/94 | HR 96 | Temp 97.7°F | Ht 66.0 in | Wt 150.0 lb

## 2018-08-10 DIAGNOSIS — F320 Major depressive disorder, single episode, mild: Secondary | ICD-10-CM

## 2018-08-10 DIAGNOSIS — F1190 Opioid use, unspecified, uncomplicated: Secondary | ICD-10-CM

## 2018-08-10 DIAGNOSIS — N809 Endometriosis, unspecified: Secondary | ICD-10-CM

## 2018-08-10 DIAGNOSIS — R102 Pelvic and perineal pain: Secondary | ICD-10-CM | POA: Diagnosis not present

## 2018-08-10 DIAGNOSIS — G894 Chronic pain syndrome: Secondary | ICD-10-CM

## 2018-08-10 DIAGNOSIS — G8929 Other chronic pain: Secondary | ICD-10-CM

## 2018-08-10 MED ORDER — TAPENTADOL HCL 75 MG PO TABS: 75.0000 mg | ORAL_TABLET | Freq: Every day | ORAL | 0 refills | Status: DC | PRN

## 2018-08-10 MED ORDER — TAPENTADOL HCL 75 MG PO TABS: 75.0000 mg | ORAL_TABLET | Freq: Every day | ORAL | 0 refills | Status: AC | PRN
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
## 2018-08-10 NOTE — Progress Notes (Signed)
Nursing Pain Medication Assessment:  Safety precautions to be maintained throughout the outpatient stay will include: orient to surroundings, keep bed in low position, maintain call bell within reach at all times, provide assistance with transfer out of bed and ambulation.  Medication Inspection Compliance: Pill count conducted under aseptic conditions, in front of the patient. Neither the pills nor the bottle was removed from the patient's sight at any time. Once count was completed pills were immediately returned to the patient in their original bottle.  Medication: Tapentadol (Nucynta) Pill/Patch Count: 17 of 30 pills remain Pill/Patch Appearance: Markings consistent with prescribed medication Bottle Appearance: Standard pharmacy container. Clearly labeled. Filled Date: 2 / 52 / 2020 Last Medication intake:  Today

## 2018-08-26 ENCOUNTER — Telehealth: Payer: Self-pay

## 2018-08-26 NOTE — Telephone Encounter (Signed)
Copied from [place] [phone]. Topic: General - Other >> Aug 26, 2018 10:47 AM Rayann Heman wrote: Reason for CRM:pt called and left a voice mail that stated she would like a copy of her recent FMLA paper work. Pt would like a call back regarding

## 2018-08-26 NOTE — Telephone Encounter (Signed)
Left a message to notify the patient her last FMLA from 01/2018 was mailed to her home address today.

## 2018-09-07 ENCOUNTER — Telehealth: Payer: Self-pay | Admitting: *Deleted

## 2018-09-07 NOTE — Telephone Encounter (Signed)
Papers faxed.

## 2018-10-28 DIAGNOSIS — Z1231 Encounter for screening mammogram for malignant neoplasm of breast: Secondary | ICD-10-CM | POA: Diagnosis not present

## 2018-10-28 DIAGNOSIS — Z6829 Body mass index (BMI) 29.0-29.9, adult: Secondary | ICD-10-CM | POA: Diagnosis not present

## 2018-10-28 DIAGNOSIS — Z01419 Encounter for gynecological examination (general) (routine) without abnormal findings: Secondary | ICD-10-CM | POA: Diagnosis not present

## 2018-11-01 ENCOUNTER — Ambulatory Visit
Payer: Federal, State, Local not specified - PPO | Attending: Student in an Organized Health Care Education/Training Program | Admitting: Student in an Organized Health Care Education/Training Program

## 2018-11-01 ENCOUNTER — Encounter: Payer: Self-pay | Admitting: Student in an Organized Health Care Education/Training Program

## 2018-11-01 ENCOUNTER — Encounter: Payer: Self-pay | Admitting: Family Medicine

## 2018-11-01 ENCOUNTER — Ambulatory Visit: Payer: Federal, State, Local not specified - PPO | Admitting: Family Medicine

## 2018-11-01 ENCOUNTER — Other Ambulatory Visit: Payer: Self-pay

## 2018-11-01 VITALS — BP 120/90 | HR 95 | Temp 97.8°F | Resp 16 | Ht 65.0 in | Wt 173.8 lb

## 2018-11-01 DIAGNOSIS — K5904 Chronic idiopathic constipation: Secondary | ICD-10-CM

## 2018-11-01 DIAGNOSIS — R7303 Prediabetes: Secondary | ICD-10-CM | POA: Diagnosis not present

## 2018-11-01 DIAGNOSIS — E559 Vitamin D deficiency, unspecified: Secondary | ICD-10-CM

## 2018-11-01 DIAGNOSIS — N809 Endometriosis, unspecified: Secondary | ICD-10-CM | POA: Diagnosis not present

## 2018-11-01 DIAGNOSIS — M5412 Radiculopathy, cervical region: Secondary | ICD-10-CM | POA: Diagnosis not present

## 2018-11-01 DIAGNOSIS — E8881 Metabolic syndrome: Secondary | ICD-10-CM

## 2018-11-01 DIAGNOSIS — G8929 Other chronic pain: Secondary | ICD-10-CM

## 2018-11-01 DIAGNOSIS — G894 Chronic pain syndrome: Secondary | ICD-10-CM

## 2018-11-01 DIAGNOSIS — F339 Major depressive disorder, recurrent, unspecified: Secondary | ICD-10-CM

## 2018-11-01 DIAGNOSIS — E041 Nontoxic single thyroid nodule: Secondary | ICD-10-CM

## 2018-11-01 DIAGNOSIS — R102 Pelvic and perineal pain: Secondary | ICD-10-CM | POA: Diagnosis not present

## 2018-11-01 DIAGNOSIS — I10 Essential (primary) hypertension: Secondary | ICD-10-CM | POA: Diagnosis not present

## 2018-11-01 DIAGNOSIS — F320 Major depressive disorder, single episode, mild: Secondary | ICD-10-CM

## 2018-11-01 MED ORDER — TAPENTADOL HCL 75 MG PO TABS: 75.0000 mg | ORAL_TABLET | Freq: Every day | ORAL | 0 refills | Status: AC | PRN

## 2018-11-01 MED ORDER — VITAMIN D (ERGOCALCIFEROL) 1.25 MG (50000 UNIT) PO CAPS: ORAL_CAPSULE | ORAL | 0 refills | Status: DC

## 2018-11-01 MED ORDER — METFORMIN HCL ER 750 MG PO TB24: 750.0000 mg | ORAL_TABLET | Freq: Every day | ORAL | 0 refills | Status: DC

## 2018-11-01 MED ORDER — HYDROCHLOROTHIAZIDE 12.5 MG PO TABS: 12.5000 mg | ORAL_TABLET | Freq: Every day | ORAL | 0 refills | Status: DC

## 2018-11-01 MED ORDER — TAPENTADOL HCL 75 MG PO TABS: 75.0000 mg | ORAL_TABLET | Freq: Every day | ORAL | 0 refills | Status: DC | PRN

## 2018-11-01 MED ORDER — SEMAGLUTIDE (1 MG/DOSE) 2 MG/1.5ML ~~LOC~~ SOPN: 1.0000 mg | PEN_INJECTOR | SUBCUTANEOUS | 1 refills | Status: DC
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
## 2018-11-01 NOTE — Progress Notes (Signed)
Pain Management Virtual Encounter Note - Virtual Visit via [place] (real-time audio visits between healthcare provider and patient).  Patient's Phone No. & Preferred Pharmacy:  [phone] (home); [phone] (mobile); (Preferred) [phone] [email or web address]  [id number] [address] [place] [address] Phone: [phone] Fax: [phone]   Pre-screening note:  Our staff contacted Stacey Stanley and offered her an "in person", "face-to-face" appointment versus a telephone encounter. She indicated preferring the telephone encounter, at this time.  Reason for Virtual Visit: COVID-19*  Social distancing based on CDC and AMA recommendations.   I contacted Stacey Stanley on 11/01/2018 at 11:33 AM via video conference.      I clearly identified myself as [name], MD. I verified that I was speaking with the correct person using two identifiers (Name and date of birth: 1975-12-27).  Advanced Informed Consent I sought verbal advanced consent from Stacey Stanley for virtual visit interactions. I informed Ms. Fahs of possible security and privacy concerns, risks, and limitations associated with providing "not-in-person" medical evaluation and management services. I also informed Ms. Recker of the availability of "in-person" appointments. Finally, I informed her that there would be a charge for the virtual visit and that she could be  personally, fully or partially, financially responsible for it. [name] expressed understanding and agreed to proceed.   Historic Elements   Stacey Stanley is a 43 y.o. year old, female patient evaluated today after her last encounter by our practice on 09/07/2018. Stacey Stanley  has a past medical history of Anxiety, Arthritis, Chronic insomnia, Depression, Diabetes mellitus without complication ([place]), Dysrhythmia, Endometriosis, GERD  (gastroesophageal reflux disease), Headache(784.0), Hypertension, Kidney stones, Metabolic syndrome, Obesity, Serum calcium elevated, Sleep disorder, circadian, shift work type, SVD (spontaneous vaginal delivery), and Tachycardia. She also  has a past surgical history that includes metatarsil  (2003); Dilation and curettage of uterus; Induced abortion; laparoscopy (1992); Eye surgery (06/28/13); laparoscopy (N/A, 07/19/2013); Hysteroscopy w/D&C (N/A, 07/19/2013); Abdominal hysterectomy (05/21/2016); and Hemorroidectomy (04/04/2018). Ms. Basinski has a current medication list which includes the following prescription(s): acetaminophen, b-12, duloxetine, hydrochlorothiazide, ibuprofen, metformin, semaglutide (1 mg/dose), tapentadol hcl, tapentadol hcl, and vitamin d (ergocalciferol). She  reports that she has been smoking cigarettes. She started smoking about 24 years ago. She has a 12.00 pack-year smoking history. She has never used smokeless tobacco. She reports that she does not drink alcohol or use drugs. Ms. Napoleon is allergic to nuvigil [armodafinil].   HPI  Today, she is being contacted for medication management.   Virtual visit today for medication management.  No changes in medical history.  States that chronic pain is managed on her current regimen of Nucynta which she takes 1 tablet daily as needed.  Patient's last visit with me was on 08/10/2018.  At that time 3 prescriptions of Nucynta were written with a confirmation of electronic receipt by pharmacy seen below.  However when patient called pharmacy, they did not have her third prescription there.  Patient has been without her medication for 3 weeks now.  I called pharmacy as well and they confirmed that they did not have a Nucynta prescription waiting for her even though it says confirmed receipt below.  We will contact IT and see what happened.   tapentadol HCl (NUCYNTA) 75 MG tablet 30 tablet 0 10/18/2018 11/17/2018   Sig - Route: Take 1 tablet (75  mg total) by  mouth daily as needed for up to 30 days for severe pain. Do not take if you have epilepsy or a history of seizures. Swallow tablets whole. Do not chew, crush or dissolve. - Oral   Sent to pharmacy as: tapentadol HCl (NUCYNTA) 75 MG tablet   Earliest Fill Date: 10/18/2018   Notes to Pharmacy: [place] STOP ACT - Not applicable. Fill one day early if pharmacy is closed on scheduled refill date.   E-Prescribing Status: Receipt confirmed by pharmacy (08/10/2018 1:30 PM EDT)      Pharmacotherapy Assessment   09/27/2018  3   08/10/2018  Nucynta 75 MG Tablet  30.00 30 Bi Lat   [id number]   Wal (5343)   0  30.00 MME  Comm Ins   [place]     Monitoring: Pharmacotherapy: No side-effects or adverse reactions reported. [place] PMP: PDMP reviewed during this encounter.       Compliance: No problems identified. Effectiveness: Clinically acceptable. Plan: Refer to "POC".  Pertinent Labs  Renal Function Lab Results  Component Value Date   BUN 13 07/19/2018   CREATININE 0.72 62/94/7654   BCR NOT APPLICABLE 65/07/5463   GFRAA >60 07/19/2018   GFRNONAA >60 07/19/2018   Hepatic Function Lab Results  Component Value Date   AST 14 (L) 07/19/2018   ALT 9 07/19/2018   ALBUMIN 4.2 07/19/2018   UDS Summary  Date Value Ref Range Status  12/22/2017 FINAL  Final    Comment:    ==================================================================== TOXASSURE COMP DRUG ANALYSIS,UR ==================================================================== Test                             Result       Flag       Units Drug Present and Declared for Prescription Verification   Tapentadol                     >3788        EXPECTED   ng/mg creat    Source of tapentadol is a scheduled prescription medication. Drug Present not Declared for Prescription Verification   Oxazepam                       575          UNEXPECTED ng/mg creat   Temazepam                      >758         UNEXPECTED ng/mg creat    Oxazepam and  temazepam are expected metabolites of diazepam.    Oxazepam is also an expected metabolite of other benzodiazepine    drugs, including chlordiazepoxide, prazepam, clorazepate,    halazepam, and temazepam.  Oxazepam and temazepam are available    as scheduled prescription medications.   Acetaminophen                  PRESENT      UNEXPECTED Drug Absent but Declared for Prescription Verification   Duloxetine                     Not Detected UNEXPECTED ==================================================================== Test                      Result    Flag   Units      Ref Range   Creatinine  264              mg/dL      >=20 ==================================================================== Declared Medications:  The flagging and interpretation on this report are based on the  following declared medications.  Unexpected results may arise from  inaccuracies in the declared medications.  **Note: The testing scope of this panel includes these medications:  Duloxetine  Tapentadol  **Note: The testing scope of this panel does not include following  reported medications:  Cyanocobalamin  Metformin  Multivitamin  Semaglutide  Varenicline  Vitamin D2 (Ergocalciferol) ==================================================================== For clinical consultation, please call [phone]. ====================================================================    Note: Above Lab results reviewed.  Recent imaging  US Pelvis Complete CLINICAL DATA:  Left lower quadrant pain, prior hysterectomy  EXAM: TRANSABDOMINAL ULTRASOUND OF PELVIS  TECHNIQUE: Transabdominal ultrasound examination of the pelvis was performed including evaluation of the uterus, ovaries, adnexal regions, and pelvic cul-de-sac.  COMPARISON:  CT abdomen/pelvis dated 09/09/2017  FINDINGS: Uterus  Surgically absent.  Right ovary  Not discretely visualized.  No adnexal mass is seen.  Left  ovary  Measurements: 1.9 x 1.5 x 1.6 cm. Poorly visualized but grossly unremarkable.  Other findings:  No abnormal free fluid.  IMPRESSION: Left ovary is grossly unremarkable.  Right ovary is not discretely visualized.  Status post hysterectomy.  Electronically Signed   By: Julian Hy M.D.   On: 11/12/2017 16:49  Assessment  The primary encounter diagnosis was Chronic pelvic pain in female. Diagnoses of Pelvic pain, Endometriosis, Cervical radiculitis, Dysmetabolic syndrome, Mild major depression ([place]), and Chronic pain syndrome were also pertinent to this visit.  Plan of Care  I am having Mrs. Jamariyah R. Rinehimer start on tapentadol HCl. I am also having her maintain her B-12, acetaminophen, ibuprofen, DULoxetine, metFORMIN, hydrochlorothiazide, and tapentadol HCl.  Pharmacotherapy (Medications Ordered): Meds ordered this encounter  Medications  . tapentadol HCl (NUCYNTA) 75 MG tablet    Sig: Take 1 tablet (75 mg total) by mouth daily as needed for up to 30 days for severe pain. Do not take if you have epilepsy or a history of seizures. Swallow tablets whole. Do not chew, crush or dissolve.    Dispense:  30 tablet    Refill:  0    [place] STOP ACT - Not applicable. Fill one day early if pharmacy is closed on scheduled refill date.  . tapentadol HCl (NUCYNTA) 75 MG tablet    Sig: Take 1 tablet (75 mg total) by mouth daily as needed for up to 30 days for severe pain. Do not take if you have epilepsy or a history of seizures. Swallow tablets whole. Do not chew, crush or dissolve.    Dispense:  30 tablet    Refill:  0    [place] STOP ACT - Not applicable. Fill one day early if pharmacy is closed on scheduled refill date.   Orders:  No orders of the defined types were placed in this encounter.  Follow-up plan:   Return in about 8 weeks (around 12/27/2018) for Medication Management.    I discussed the assessment and treatment plan with the patient. The patient was provided an  opportunity to ask questions and all were answered. The patient agreed with the plan and demonstrated an understanding of the instructions.  Patient advised to call back or seek an in-person evaluation if the symptoms or condition worsens.  Total duration of non-face-to-face encounter: 25 minutes.  Note by: [name], MD Date: 11/01/2018; Time: 11:33 AM  Note: This dictation  was prepared with Dragon dictation. Any transcriptional errors that may result from this process are unintentional.  Disclaimer:  * Given the special circumstances of the COVID-19 pandemic, the federal government has announced that the Office for Civil Rights (OCR) will exercise its enforcement discretion and will not impose penalties on physicians using telehealth in the event of noncompliance with regulatory requirements under the [place] (HIPAA) in connection with the good faith provision of telehealth during the OOILN-79 national public health emergency. ([place])

## 2018-11-08 ENCOUNTER — Telehealth: Payer: Self-pay | Admitting: *Deleted

## 2018-11-08 NOTE — Telephone Encounter (Signed)
We did a FMLA in 07/2018. I attempted to call patient to ask if she is looking for this, or needs another one completed. Unable to leave a message.

## 2018-11-09 NOTE — Telephone Encounter (Signed)
Attempted again to call, unable to leave a message.

## 2018-11-09 NOTE — Telephone Encounter (Signed)
Spoke with patient, she will fax a new FMLA to be completed.

## 2018-12-19 ENCOUNTER — Encounter: Payer: Self-pay | Admitting: Student in an Organized Health Care Education/Training Program

## 2018-12-20 ENCOUNTER — Ambulatory Visit
Payer: Federal, State, Local not specified - PPO | Attending: Student in an Organized Health Care Education/Training Program | Admitting: Student in an Organized Health Care Education/Training Program

## 2018-12-20 ENCOUNTER — Encounter: Payer: Self-pay | Admitting: Student in an Organized Health Care Education/Training Program

## 2018-12-20 ENCOUNTER — Other Ambulatory Visit: Payer: Self-pay

## 2018-12-20 DIAGNOSIS — N809 Endometriosis, unspecified: Secondary | ICD-10-CM | POA: Diagnosis not present

## 2018-12-20 DIAGNOSIS — G894 Chronic pain syndrome: Secondary | ICD-10-CM

## 2018-12-20 DIAGNOSIS — E8881 Metabolic syndrome: Secondary | ICD-10-CM

## 2018-12-20 DIAGNOSIS — R102 Pelvic and perineal pain: Secondary | ICD-10-CM

## 2018-12-20 DIAGNOSIS — M5412 Radiculopathy, cervical region: Secondary | ICD-10-CM

## 2018-12-20 DIAGNOSIS — G8929 Other chronic pain: Secondary | ICD-10-CM

## 2018-12-20 DIAGNOSIS — F320 Major depressive disorder, single episode, mild: Secondary | ICD-10-CM

## 2018-12-20 MED ORDER — TAPENTADOL HCL 75 MG PO TABS: 75.0000 mg | ORAL_TABLET | Freq: Every day | ORAL | 0 refills | Status: DC | PRN

## 2018-12-20 MED ORDER — TAPENTADOL HCL 75 MG PO TABS: 75.0000 mg | ORAL_TABLET | Freq: Every day | ORAL | 0 refills | Status: AC | PRN

## 2018-12-20 NOTE — Progress Notes (Signed)
Pain Management Virtual Encounter Note - Virtual Visit via Telephone Telehealth (real-time audio visits between healthcare provider and patient).   Patient's Phone No. & Preferred Pharmacy:  [phone] (home); [phone] (mobile); (Preferred) [phone] [email or web address]  [facility] [place], [address] [place] [address] Phone: [phone] Fax: [phone]    Pre-screening note:  Our staff contacted Stacey Stanley and offered her an "in person", "face-to-face" appointment versus a telephone encounter. She indicated preferring the telephone encounter, at this time.   Reason for Virtual Visit: COVID-19*  Social distancing based on CDC and AMA recommendations.   I contacted Stacey Stanley on 12/20/2018 via telephone.      I clearly identified myself as [name], MD. I verified that I was speaking with the correct person using two identifiers (Name: Stacey Stanley, and date of birth: [birth year]).  Advanced Informed Consent I sought verbal advanced consent from Stacey Stanley for virtual visit interactions. I informed Stacey Stanley of possible security and privacy concerns, risks, and limitations associated with providing "not-in-person" medical evaluation and management services. I also informed Stacey Stanley of the availability of "in-person" appointments. Finally, I informed her that there would be a charge for the virtual visit and that she could be  personally, fully or partially, financially responsible for it. Stacey Stanley expressed understanding and agreed to proceed.   Historic Elements   Stacey Stanley is a 43 y.o. year old, female patient evaluated today after her last encounter by our practice on 11/08/2018. Stacey Stanley  has a past medical history of Anxiety, Arthritis, Chronic insomnia, Depression, Diabetes mellitus without complication ([place]), Dysrhythmia, Endometriosis, GERD  (gastroesophageal reflux disease), Headache(784.0), Hypertension, Kidney stones, Metabolic syndrome, Obesity, Serum calcium elevated, Sleep disorder, circadian, shift work type, SVD (spontaneous vaginal delivery), and Tachycardia. She also  has a past surgical history that includes metatarsil  (2003); Dilation and curettage of uterus; Induced abortion; laparoscopy (1992); Eye surgery (06/28/13); laparoscopy (N/A, 07/19/2013); Hysteroscopy w/D&C (N/A, 07/19/2013); Abdominal hysterectomy (05/21/2016); and Hemorroidectomy (04/04/2018). Stacey Stanley has a current medication list which includes the following prescription(s): acetaminophen, aspirin ec, b-12, duloxetine, hydrochlorothiazide, ibuprofen, metformin, fish oil, semaglutide (1 mg/dose), tapentadol hcl, tapentadol hcl, and vitamin d (ergocalciferol). She  reports that she has been smoking cigarettes. She started smoking about 25 years ago. She has a 12.00 pack-year smoking history. She has never used smokeless tobacco. She reports that she does not drink alcohol or use drugs. Stacey Stanley is allergic to nuvigil [armodafinil].   HPI  Today, she is being contacted for medication management.   No change in medical history since last visit.  Patient's pain is at baseline.  Patient continues multimodal pain regimen as prescribed.  States that it provides pain relief and improvement in functional status.   Pharmacotherapy Assessment   12/04/2018  3   11/01/2018  Nucynta 75 MG Tablet  30.00 30 Bi Lat   [id number]   Wal (5343)   0  30.00 MME  Comm Ins   [place]   }   Monitoring: Pharmacotherapy: No side-effects or adverse reactions reported. [place] PMP: PDMP reviewed during this encounter.       Compliance: No problems identified. Effectiveness: Clinically acceptable. Plan: Refer to "POC".  Pertinent Labs   SAFETY SCREENING Profile Lab Results  Component Value Date   HIV Non Reactive 09/09/2017   PREGTESTUR NEGATIVE 01/23/2015   Renal Function Lab Results  Component Value Date   BUN 13 07/19/2018   CREATININE 0.72 77/41/2878   BCR NOT APPLICABLE 67/67/2094   GFRAA >60 07/19/2018   GFRNONAA >60 07/19/2018   Hepatic Function Lab Results  Component Value Date   AST 14 (L) 07/19/2018   ALT 9 07/19/2018   ALBUMIN 4.2 07/19/2018   UDS Summary  Date Value Ref Range Status  12/22/2017 FINAL  Final    Comment:    ==================================================================== TOXASSURE COMP DRUG ANALYSIS,UR ==================================================================== Test                             Result       Flag       Units Drug Present and Declared for Prescription Verification   Tapentadol                     >3788        EXPECTED   ng/mg creat    Source of tapentadol is a scheduled prescription medication. Drug Present not Declared for Prescription Verification   Oxazepam                       575          UNEXPECTED ng/mg creat   Temazepam                      >758         UNEXPECTED ng/mg creat    Oxazepam and temazepam are expected metabolites of diazepam.    Oxazepam is also an expected metabolite of other benzodiazepine    drugs, including chlordiazepoxide, prazepam, clorazepate,    halazepam, and temazepam.  Oxazepam and temazepam are available    as scheduled prescription medications.   Acetaminophen                  PRESENT      UNEXPECTED Drug Absent but Declared for Prescription Verification   Duloxetine                     Not Detected UNEXPECTED ==================================================================== Test                      Result    Flag   Units      Ref Range   Creatinine              264              mg/dL      >=20 ==================================================================== Declared Medications:  The flagging and interpretation on this report are based on the  following declared medications.  Unexpected results may arise from  inaccuracies in the declared medications.   **Note: The testing scope of this panel includes these medications:  Duloxetine  Tapentadol  **Note: The testing scope of this panel does not include following  reported medications:  Cyanocobalamin  Metformin  Multivitamin  Semaglutide  Varenicline  Vitamin D2 (Ergocalciferol) ==================================================================== For clinical consultation, please call [phone]. ====================================================================    Note: Above Lab results reviewed.  Recent imaging  US Pelvis Complete CLINICAL DATA:  Left lower quadrant pain, prior hysterectomy  EXAM: TRANSABDOMINAL ULTRASOUND OF PELVIS  TECHNIQUE: Transabdominal ultrasound examination of the pelvis was performed including evaluation of the uterus, ovaries, adnexal regions, and pelvic cul-de-sac.  COMPARISON:  CT abdomen/pelvis dated 09/09/2017  FINDINGS: Uterus  Surgically absent.  Right ovary  Not discretely visualized.  No adnexal mass is seen.  Left ovary  Measurements: 1.9 x 1.5 x 1.6 cm. Poorly visualized but grossly unremarkable.  Other findings:  No abnormal free fluid.  IMPRESSION: Left ovary is grossly unremarkable.  Right ovary is not discretely visualized.  Status post hysterectomy.  Electronically Signed   By: Julian Hy M.D.   On: 11/12/2017 16:49  Assessment  The primary encounter diagnosis was Chronic pelvic pain in female. Diagnoses of Pelvic pain, Endometriosis, Cervical radiculitis, Dysmetabolic syndrome, Mild major depression ([place]), and Chronic pain syndrome were also pertinent to this visit.  Plan of Care  I have changed Stacey Stanley's tapentadol HCl. I am also having her start on tapentadol HCl. Additionally, I am having her maintain her B-12, acetaminophen, ibuprofen, DULoxetine, metFORMIN, hydrochlorothiazide, Semaglutide (1 MG/DOSE), Vitamin D (Ergocalciferol), aspirin EC, and Fish Oil.  Pharmacotherapy  (Medications Ordered): Meds ordered this encounter  Medications  . tapentadol HCl (NUCYNTA) 75 MG tablet    Sig: Take 1 tablet (75 mg total) by mouth daily as needed for severe pain. Do not take if you have epilepsy or a history of seizures. Swallow tablets whole. Do not chew, crush or dissolve.    Dispense:  30 tablet    Refill:  0    [place] STOP ACT - Not applicable. Fill one day early if pharmacy is closed on scheduled refill date.  . tapentadol HCl (NUCYNTA) 75 MG tablet    Sig: Take 1 tablet (75 mg total) by mouth daily as needed for severe pain. Do not take if you have epilepsy or a history of seizures. Swallow tablets whole. Do not chew, crush or dissolve.    Dispense:  30 tablet    Refill:  0    [place] STOP ACT - Not applicable. Fill one day early if pharmacy is closed on scheduled refill date.   Orders:  No orders of the defined types were placed in this encounter.  Follow-up plan:   Return in about 2 months (around 03/02/2019) for Medication Management.    Recent Visits Date Type Provider Dept  11/01/18 Office Visit [name], MD [facility]  Showing recent visits within past 90 days and meeting all other requirements   Today's Visits Date Type Provider Dept  12/20/18 Office Visit [name], MD [facility]  Showing today's visits and meeting all other requirements   Future Appointments No visits were found meeting these conditions.  Showing future appointments within next 90 days and meeting all other requirements   I discussed the assessment and treatment plan with the patient. The patient was provided an opportunity to ask questions and all were answered. The patient agreed with the plan and demonstrated an understanding of the instructions.  Patient advised to call back or seek an in-person evaluation if the symptoms or condition worsens.   Note by: [name], MD Date: 12/20/2018; Time: 8:59 AM  Note: This dictation was prepared with Dragon  dictation. Any transcriptional errors that may result from this process are unintentional.  Disclaimer:  * Given the special circumstances of the COVID-19 pandemic, the federal government has announced that the Office for Civil Rights (OCR) will exercise its enforcement discretion and will not impose penalties on physicians using telehealth in the event of noncompliance with regulatory requirements under the [place] and [place] (HIPAA) in connection with the good faith provision of telehealth during the JQBHA-19 national public health emergency. ([place])

## 2018-12-21 ENCOUNTER — Telehealth: Payer: Self-pay

## 2018-12-21 NOTE — Telephone Encounter (Signed)
Copied from [place] [id number]. Topic: General - Inquiry >> Dec 21, 2018 10:07 AM Stacey Stanley, NT wrote: Reason for CRM: Patient is calling in seeking advice to see if she needs an appointment for a physical history form for school or if she can just drop off form. Call back is [phone].

## 2018-12-22 NOTE — Telephone Encounter (Signed)
Pt is scheduled for monday

## 2018-12-26 ENCOUNTER — Encounter: Payer: Federal, State, Local not specified - PPO | Admitting: Family Medicine

## 2018-12-26 ENCOUNTER — Encounter: Payer: Self-pay | Admitting: Family Medicine

## 2018-12-28 ENCOUNTER — Encounter: Payer: Self-pay | Admitting: Family Medicine

## 2018-12-28 ENCOUNTER — Ambulatory Visit (INDEPENDENT_AMBULATORY_CARE_PROVIDER_SITE_OTHER): Payer: Federal, State, Local not specified - PPO | Admitting: Family Medicine

## 2018-12-28 ENCOUNTER — Other Ambulatory Visit: Payer: Self-pay

## 2018-12-28 VITALS — BP 112/76 | HR 80 | Temp 96.8°F | Resp 16 | Ht 66.0 in | Wt 186.4 lb

## 2018-12-28 DIAGNOSIS — Z01419 Encounter for gynecological examination (general) (routine) without abnormal findings: Secondary | ICD-10-CM

## 2018-12-28 DIAGNOSIS — R7303 Prediabetes: Secondary | ICD-10-CM

## 2018-12-28 DIAGNOSIS — Z1159 Encounter for screening for other viral diseases: Secondary | ICD-10-CM | POA: Diagnosis not present
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
## 2018-12-28 NOTE — Progress Notes (Signed)
Not seen

## 2018-12-28 NOTE — Patient Instructions (Signed)

## 2018-12-29 LAB — HEMOGLOBIN A1C
Hgb A1c MFr Bld: 5.7 % of total Hgb — ABNORMAL HIGH (ref <5.7)
Mean Plasma Glucose: 117 (calc)
eAG (mmol/L): 6.5 (calc)

## 2018-12-29 LAB — HEPATITIS C ANTIBODY
Hepatitis C Ab: NONREACTIVE
SIGNAL TO CUT-OFF: 0.01 (ref <1.00)

## 2019-01-30 DIAGNOSIS — E041 Nontoxic single thyroid nodule: Secondary | ICD-10-CM | POA: Diagnosis not present

## 2019-02-01 ENCOUNTER — Encounter: Payer: Self-pay | Admitting: Family Medicine

## 2019-02-01 ENCOUNTER — Ambulatory Visit (INDEPENDENT_AMBULATORY_CARE_PROVIDER_SITE_OTHER): Payer: Federal, State, Local not specified - PPO | Admitting: Family Medicine

## 2019-02-01 DIAGNOSIS — F411 Generalized anxiety disorder: Secondary | ICD-10-CM

## 2019-02-01 DIAGNOSIS — E8881 Metabolic syndrome: Secondary | ICD-10-CM

## 2019-02-01 DIAGNOSIS — F339 Major depressive disorder, recurrent, unspecified: Secondary | ICD-10-CM | POA: Diagnosis not present

## 2019-02-01 DIAGNOSIS — G4700 Insomnia, unspecified: Secondary | ICD-10-CM | POA: Diagnosis not present

## 2019-02-01 DIAGNOSIS — E559 Vitamin D deficiency, unspecified: Secondary | ICD-10-CM

## 2019-02-01 DIAGNOSIS — I10 Essential (primary) hypertension: Secondary | ICD-10-CM

## 2019-02-01 DIAGNOSIS — R7303 Prediabetes: Secondary | ICD-10-CM

## 2019-02-01 MED ORDER — OZEMPIC (1 MG/DOSE) 2 MG/1.5ML ~~LOC~~ SOPN: 1.0000 mg | PEN_INJECTOR | SUBCUTANEOUS | 1 refills | Status: DC

## 2019-02-01 MED ORDER — DULOXETINE HCL 60 MG PO CPEP: 120.0000 mg | ORAL_CAPSULE | Freq: Every day | ORAL | 1 refills | Status: DC

## 2019-02-01 MED ORDER — METFORMIN HCL ER 750 MG PO TB24: 750.0000 mg | ORAL_TABLET | Freq: Every day | ORAL | 1 refills | Status: DC

## 2019-02-01 MED ORDER — VITAMIN D (ERGOCALCIFEROL) 1.25 MG (50000 UNIT) PO CAPS: ORAL_CAPSULE | ORAL | 1 refills | Status: DC

## 2019-02-01 MED ORDER — TRAZODONE HCL 50 MG PO TABS: 25.0000 mg | ORAL_TABLET | Freq: Every evening | ORAL | 0 refills | Status: DC | PRN

## 2019-02-01 MED ORDER — HYDROCHLOROTHIAZIDE 12.5 MG PO TABS: 12.5000 mg | ORAL_TABLET | Freq: Every day | ORAL | 1 refills | Status: DC
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
## 2019-02-07 ENCOUNTER — Ambulatory Visit (INDEPENDENT_AMBULATORY_CARE_PROVIDER_SITE_OTHER): Payer: Federal, State, Local not specified - PPO

## 2019-02-07 ENCOUNTER — Other Ambulatory Visit: Payer: Self-pay

## 2019-02-07 DIAGNOSIS — Z23 Encounter for immunization: Secondary | ICD-10-CM

## 2019-02-07 DIAGNOSIS — M542 Cervicalgia: Secondary | ICD-10-CM | POA: Diagnosis not present

## 2019-02-07 DIAGNOSIS — M546 Pain in thoracic spine: Secondary | ICD-10-CM | POA: Diagnosis not present

## 2019-02-10 DIAGNOSIS — E041 Nontoxic single thyroid nodule: Secondary | ICD-10-CM | POA: Diagnosis not present

## 2019-02-18 ENCOUNTER — Encounter (HOSPITAL_COMMUNITY): Payer: Self-pay

## 2019-02-18 ENCOUNTER — Other Ambulatory Visit: Payer: Self-pay

## 2019-02-18 ENCOUNTER — Emergency Department (HOSPITAL_COMMUNITY)
Admission: EM | Admit: 2019-02-18 | Discharge: 2019-02-18 | Disposition: A | Discharge status: Home / Self Care | Payer: Federal, State, Local not specified - PPO | Attending: Emergency Medicine | Admitting: Emergency Medicine

## 2019-02-18 DIAGNOSIS — Z7984 Long term (current) use of oral hypoglycemic drugs: Secondary | ICD-10-CM | POA: Diagnosis not present

## 2019-02-18 DIAGNOSIS — Z7982 Long term (current) use of aspirin: Secondary | ICD-10-CM | POA: Diagnosis not present

## 2019-02-18 DIAGNOSIS — E119 Type 2 diabetes mellitus without complications: Secondary | ICD-10-CM | POA: Insufficient documentation

## 2019-02-18 DIAGNOSIS — R109 Unspecified abdominal pain: Secondary | ICD-10-CM | POA: Diagnosis not present

## 2019-02-18 DIAGNOSIS — K5900 Constipation, unspecified: Secondary | ICD-10-CM | POA: Insufficient documentation

## 2019-02-18 DIAGNOSIS — Z79899 Other long term (current) drug therapy: Secondary | ICD-10-CM | POA: Diagnosis not present

## 2019-02-18 DIAGNOSIS — R102 Pelvic and perineal pain: Secondary | ICD-10-CM | POA: Insufficient documentation

## 2019-02-18 DIAGNOSIS — F17210 Nicotine dependence, cigarettes, uncomplicated: Secondary | ICD-10-CM | POA: Insufficient documentation

## 2019-02-18 DIAGNOSIS — I10 Essential (primary) hypertension: Secondary | ICD-10-CM | POA: Insufficient documentation

## 2019-02-18 LAB — COMPREHENSIVE METABOLIC PANEL
ALT: 11 U/L (ref 0–44)
AST: 16 U/L (ref 15–41)
Albumin: 4 g/dL (ref 3.5–5.0)
Alkaline Phosphatase: 37 U/L — ABNORMAL LOW (ref 38–126)
Anion gap: 9 (ref 5–15)
BUN: 8 mg/dL (ref 6–20)
CO2: 27 mmol/L (ref 22–32)
Calcium: 10.1 mg/dL (ref 8.9–10.3)
Chloride: 103 mmol/L (ref 98–111)
Creatinine, Ser: 0.79 mg/dL (ref 0.44–1.00)
GFR calc Af Amer: >60 mL/min (ref >60)
GFR calc non Af Amer: >60 mL/min (ref >60)
Glucose, Bld: 88 mg/dL (ref 70–99)
Potassium: 3.8 mmol/L (ref 3.5–5.1)
Sodium: 139 mmol/L (ref 135–145)
Total Bilirubin: 0.7 mg/dL (ref 0.3–1.2)
Total Protein: 6.5 g/dL (ref 6.5–8.1)

## 2019-02-18 LAB — URINALYSIS, ROUTINE W REFLEX MICROSCOPIC
Bilirubin Urine: NEGATIVE
Glucose, UA: NEGATIVE mg/dL
Hgb urine dipstick: NEGATIVE
Ketones, ur: NEGATIVE mg/dL
Leukocytes,Ua: NEGATIVE
Nitrite: NEGATIVE
Protein, ur: NEGATIVE mg/dL
Specific Gravity, Urine: 1.019 (ref 1.005–1.030)
pH: 8 (ref 5.0–8.0)

## 2019-02-18 LAB — LIPASE, BLOOD: Lipase: 31 U/L (ref 11–51)

## 2019-02-18 LAB — CBC
HCT: 44.9 % (ref 36.0–46.0)
Hemoglobin: 15.1 g/dL — ABNORMAL HIGH (ref 12.0–15.0)
MCH: 29.7 pg (ref 26.0–34.0)
MCHC: 33.6 g/dL (ref 30.0–36.0)
MCV: 88.4 fL (ref 80.0–100.0)
Platelets: 277 10*3/uL (ref 150–400)
RBC: 5.08 MIL/uL (ref 3.87–5.11)
RDW: 12.2 % (ref 11.5–15.5)
WBC: 8.7 10*3/uL (ref 4.0–10.5)
nRBC: 0 % (ref 0.0–0.2)

## 2019-02-18 MED ORDER — DICYCLOMINE HCL 20 MG PO TABS: ORAL_TABLET | ORAL | 0 refills | Status: DC

## 2019-02-18 NOTE — ED Triage Notes (Signed)
Pt endorses mid abd pain intermittently x 2 months. Denies n/v/d or urinary sx.

## 2019-02-18 NOTE — ED Notes (Signed)
Patient verbalizes understanding of discharge instructions. Opportunity for questioning and answers were provided. Armband removed by staff, pt discharged from ED.  

## 2019-02-18 NOTE — ED Provider Notes (Signed)
[facility] EMERGENCY DEPARTMENT Provider Note   CSN: [id number] Arrival date & time: 02/18/19  1649     History   Chief Complaint Chief Complaint  Patient presents with  . Abdominal Pain    HPI Stacey Stanley is a 43 y.o. female.     Patient c/o intermittent/recurrent, crampy abdominal pain for past 6 months. Pain is mid abd and left sided. No radiation of pain. No back pain. States has been told possible due to IBD-C in past. States has seen gi for same, but not recently. States hx chronic intermittent constipation, last bm 2 days ago. Also states has chronic pelvic pain syndrome and takes medication for that issue. Denies vaginal bleeding or discharge, remote hx hysterectomy. Denies dysuria. Normal appetite. No vomiting. No abd distension. No fever or chills.   The history is provided by the patient.  Abdominal Pain Associated symptoms: constipation   Associated symptoms: no chest pain, no chills, no cough, no dysuria, no fever, no hematuria, no shortness of breath, no sore throat, no vaginal bleeding and no vomiting     Past Medical History:  Diagnosis Date  . Anxiety    no current meds  . Arthritis    left knee pain - otc med prn  . Chronic insomnia   . Depression   . Diabetes mellitus without complication ([place])   . Dysrhythmia    Home with daughter [name]  . Endometriosis   . GERD (gastroesophageal reflux disease)    diet control - no meds  . Headache(784.0)    hx - last one 2 yrs ago - no meds  . Hypertension   . Kidney stones   . Metabolic syndrome   . Obesity   . Serum calcium elevated   . Sleep disorder, circadian, shift work type   . SVD (spontaneous vaginal delivery)    x 2  . Tachycardia    History - neg cardiac tests-stress related    Patient Active Problem List   Diagnosis Date Noted  . Chronic pain syndrome 12/28/2017  . Opiate use 12/28/2017  . Long term prescription benzodiazepine use 12/28/2017  .  Thrombocytosis ([place]) 10/26/2017  . Internal hemorrhoids 10/13/2017  . Chronic idiopathic constipation 10/13/2017  . Rectal bleed 09/10/2017  . Hypomagnesemia 09/10/2017  . Pelvic pain 09/09/2017  . Mass of left ovary 06/28/2017  . Kidney stone on left side 06/28/2017  . Cervical radiculitis 03/08/2017  . Tobacco dependence 10/28/2016  . Vitamin B12 deficiency 08/19/2016  . Prediabetes 08/19/2016  . Right thyroid nodule 07/31/2015  . Chronic pelvic pain in female 04/05/2015  . GERD (gastroesophageal reflux disease) 04/05/2015  . Mild major depression ([place]) 11/25/2014  . Insomnia, persistent 11/25/2014  . Endometriosis 11/25/2014  . Essential (primary) hypertension 11/25/2014  . Dysmetabolic syndrome 123456  . Obesity (BMI 30-39.9) 11/25/2014    Past Surgical History:  Procedure Laterality Date  . ABDOMINAL HYSTERECTOMY  05/21/2016  . DILATION AND CURETTAGE OF UTERUS    . EYE SURGERY  06/28/13   right laser eye surgery - repair retina  . HEMORROIDECTOMY  04/04/2018   Dr. Levin Bacon   . HYSTEROSCOPY W/D&C N/A 07/19/2013   Procedure: DILATATION AND CURETTAGE /HYSTEROSCOPY;  Surgeon: Marvene Staff, MD;  Location: [facility];  Service: Gynecology;  Laterality: N/A;  . INDUCED ABORTION    . LAPAROSCOPY  1992   endometriosis  . LAPAROSCOPY N/A 07/19/2013   Procedure: LAPAROSCOPY DIAGNOSTIC  with resection of endometriosis;  Surgeon: Marvene Staff,  MD;  Location: [facility];  Service: Gynecology;  Laterality: N/A;  . metatarsil   2003   left foot surgery      OB History    Gravida  3   Para  2   Term  2   Preterm  0   AB  1   Living  2     SAB  1   TAB  0   Ectopic  0   Multiple  0   Live Births               Home Medications    Prior to Admission medications   Medication Sig Start Date End Date Taking? Authorizing Provider  acetaminophen (TYLENOL) 500 MG tablet Take 1,000 mg by mouth every 8 (eight) hours as needed.   Yes [provider]  aspirin EC 81 MG tablet Take 81 mg by mouth daily.   Yes [provider]  Cyanocobalamin (B-12) 1000 MCG SUBL Place 1 mL under the tongue daily. Patient taking differently: Place 1,000 Units under the tongue daily.  01/10/16  Yes Sowles, Drue Stager, MD  DULoxetine (CYMBALTA) 60 MG capsule Take 2 capsules (120 mg total) by mouth daily. 02/01/19  Yes Sowles, Drue Stager, MD  hydrochlorothiazide (HYDRODIURIL) 12.5 MG tablet Take 1 tablet (12.5 mg total) by mouth daily. 02/01/19  Yes Sowles, Drue Stager, MD  ibuprofen (ADVIL,MOTRIN) 800 MG tablet Take 800 mg by mouth every 8 (eight) hours as needed.   Yes [provider]  metFORMIN (GLUCOPHAGE XR) 750 MG 24 hr tablet Take 1 tablet (750 mg total) by mouth daily with breakfast. 02/01/19  Yes Sowles, Drue Stager, MD  Omega-3 Fatty Acids (FISH OIL) 1000 MG CAPS Take 4,000 mg by mouth daily.   Yes [provider]  Semaglutide, 1 MG/DOSE, (OZEMPIC, 1 MG/DOSE,) 2 MG/1.5ML SOPN Inject 1 mg into the skin once a week. 02/01/19  Yes Sowles, Drue Stager, MD  tapentadol HCl (NUCYNTA) 75 MG tablet Take 75 mg by mouth daily.   Yes [provider]  traZODone (DESYREL) 50 MG tablet Take 0.5-2 tablets (25-100 mg total) by mouth at bedtime as needed for sleep. 02/01/19  Yes Sowles, Drue Stager, MD  Vitamin D, Ergocalciferol, (DRISDOL) 1.25 MG (50000 UT) CAPS capsule Take weekly 02/01/19  Yes Steele Sizer, MD    Family History Family History  Problem Relation Age of Onset  . Diabetes Mother   . Hypertension Mother   . Hypertension Father   . Kidney disease Father   . Stroke Maternal Grandmother   . Leukemia Paternal Grandmother   . Alzheimer's disease Paternal Grandfather   . Heart disease Brother   . Anesthesia problems Neg Hx   . Hypotension Neg Hx   . Malignant hyperthermia Neg Hx   . Pseudochol deficiency Neg Hx     Social History Social History   Tobacco Use  . Smoking status: Current Every Day Smoker    Packs/day: 0.50    Years: 25.00     Pack years: 12.50    Types: Cigarettes    Start date: 11/25/1993  . Smokeless tobacco: Never Used  Substance Use Topics  . Alcohol use: No    Alcohol/week: 0.0 standard drinks  . Drug use: No     Allergies   Nuvigil [armodafinil]   Review of Systems Review of Systems  Constitutional: Negative for chills and fever.  HENT: Negative for sore throat.   Eyes: Negative for redness.  Respiratory: Negative for cough and shortness of breath.  Cardiovascular: Negative for chest pain.  Gastrointestinal: Positive for abdominal pain and constipation. Negative for vomiting.  Genitourinary: Negative for dysuria, hematuria and vaginal bleeding.  Musculoskeletal: Negative for back pain and neck pain.  Skin: Negative for rash.  Neurological: Negative for headaches.  Hematological: Does not bruise/bleed easily.  Psychiatric/Behavioral: Negative for confusion.     Physical Exam Updated Vital Signs BP 118/82   Pulse 84   Temp 98.5 F (36.9 C) (Oral)   Resp 14   LMP 07/09/2015 (Exact Date)   SpO2 100%   Physical Exam Vitals signs and nursing note reviewed.  Constitutional:      Appearance: Normal appearance. She is well-developed.  HENT:     Head: Atraumatic.     Nose: Nose normal.     Mouth/Throat:     Mouth: Mucous membranes are moist.  Eyes:     General: No scleral icterus.    Conjunctiva/sclera: Conjunctivae normal.     Pupils: Pupils are equal, round, and reactive to light.  Neck:     Musculoskeletal: Normal range of motion and neck supple. No neck rigidity or muscular tenderness.     Trachea: No tracheal deviation.  Cardiovascular:     Rate and Rhythm: Normal rate and regular rhythm.     Pulses: Normal pulses.     Heart sounds: Normal heart sounds. No murmur. No friction rub. No gallop.   Pulmonary:     Effort: Pulmonary effort is normal. No respiratory distress.     Breath sounds: Normal breath sounds.  Abdominal:     General: Abdomen is flat. Bowel sounds are  normal. There is no distension.     Palpations: Abdomen is soft. There is no mass.     Tenderness: There is no abdominal tenderness. There is no guarding or rebound.     Hernia: No hernia is present.     Comments: No incarcerated hernia.  Genitourinary:    Comments: No cva tenderness.  Musculoskeletal:        General: No swelling.  Skin:    General: Skin is warm and dry.     Findings: No rash.  Neurological:     Mental Status: She is alert.     Comments: Alert, speech normal.   Psychiatric:        Mood and Affect: Mood normal.      ED Treatments / Results  Labs (all labs ordered are listed, but only abnormal results are displayed) Results for orders placed or performed during the hospital encounter of 02/18/19  Lipase, blood  Result Value Ref Range   Lipase 31 11 - 51 U/L  Comprehensive metabolic panel  Result Value Ref Range   Sodium 139 135 - 145 mmol/L   Potassium 3.8 3.5 - 5.1 mmol/L   Chloride 103 98 - 111 mmol/L   CO2 27 22 - 32 mmol/L   Glucose, Bld 88 70 - 99 mg/dL   BUN 8 6 - 20 mg/dL   Creatinine, Ser 0.79 0.44 - 1.00 mg/dL   Calcium 10.1 8.9 - 10.3 mg/dL   Total Protein 6.5 6.5 - 8.1 g/dL   Albumin 4.0 3.5 - 5.0 g/dL   AST 16 15 - 41 U/L   ALT 11 0 - 44 U/L   Alkaline Phosphatase 37 (L) 38 - 126 U/L   Total Bilirubin 0.7 0.3 - 1.2 mg/dL   GFR calc non Af Amer >60 >60 mL/min   GFR calc Af Amer >60 >60 mL/min   Anion gap 9  5 - 15  CBC  Result Value Ref Range   WBC 8.7 4.0 - 10.5 K/uL   RBC 5.08 3.87 - 5.11 MIL/uL   Hemoglobin 15.1 (H) 12.0 - 15.0 g/dL   HCT 44.9 36.0 - 46.0 %   MCV 88.4 80.0 - 100.0 fL   MCH 29.7 26.0 - 34.0 pg   MCHC 33.6 30.0 - 36.0 g/dL   RDW 12.2 11.5 - 15.5 %   Platelets 277 150 - 400 K/uL   nRBC 0.0 0.0 - 0.2 %  Urinalysis, Routine w reflex microscopic  Result Value Ref Range   Color, Urine YELLOW YELLOW   APPearance HAZY (A) CLEAR   Specific Gravity, Urine 1.019 1.005 - 1.030   pH 8.0 5.0 - 8.0   Glucose, UA NEGATIVE  NEGATIVE mg/dL   Hgb urine dipstick NEGATIVE NEGATIVE   Bilirubin Urine NEGATIVE NEGATIVE   Ketones, ur NEGATIVE NEGATIVE mg/dL   Protein, ur NEGATIVE NEGATIVE mg/dL   Nitrite NEGATIVE NEGATIVE   Leukocytes,Ua NEGATIVE NEGATIVE    EKG None  Radiology No results found.  Procedures Procedures (including critical care time)  Medications Ordered in ED Medications - No data to display   Initial Impression / Assessment and Plan / ED Course  I have reviewed the triage vital signs and the nursing notes.  Pertinent labs & imaging results that were available during my care of the patient were reviewed by me and considered in my medical decision making (see chart for details).  Labs sent.  Reviewed nursing notes and prior charts for additional history.  Prior workup for recurrent/chronic abd pain reviewed.   Currently symptoms present for past 6 months without acute or abrupt change. abd is soft and non tender. Afebrile.   Labs reviewed by me - wbc normal, ua neg.   Will try bentyl for symptom improvement.   rec gi f/u.  Return precautions provided.     Final Clinical Impressions(s) / ED Diagnoses   Final diagnoses:  None    ED Discharge Orders    None       Lajean Saver, MD 02/18/19 1947

## 2019-02-18 NOTE — ED Notes (Signed)
Changing into gown at this time

## 2019-02-18 NOTE — Discharge Instructions (Addendum)
It was our pleasure to provide your ER care today - we hope that you feel better.  Your lab work looks good/normal.   Try bentyl as need for symptom relief. You may also try maalox or gas-x as need for symptom relief.  For constipation, continue to drink plenty of fluids, get adequate fiber in diet, continue colace.   Follow up with your doctor/GI doctor in the next 1-2 weeks - call office Monday to arrange follow up appointment.  Return to ER if worse, new symptoms, fevers, persistent vomiting, worsening and/or severe pain, or other concern.

## 2019-02-22 ENCOUNTER — Encounter: Payer: Self-pay | Admitting: Student in an Organized Health Care Education/Training Program

## 2019-02-23 ENCOUNTER — Encounter: Payer: Self-pay | Admitting: Student in an Organized Health Care Education/Training Program

## 2019-02-23 ENCOUNTER — Other Ambulatory Visit: Payer: Self-pay

## 2019-02-23 ENCOUNTER — Ambulatory Visit
Payer: Federal, State, Local not specified - PPO | Attending: Student in an Organized Health Care Education/Training Program | Admitting: Student in an Organized Health Care Education/Training Program

## 2019-02-23 DIAGNOSIS — F1190 Opioid use, unspecified, uncomplicated: Secondary | ICD-10-CM

## 2019-02-23 DIAGNOSIS — R102 Pelvic and perineal pain: Secondary | ICD-10-CM | POA: Diagnosis not present

## 2019-02-23 DIAGNOSIS — E8881 Metabolic syndrome: Secondary | ICD-10-CM

## 2019-02-23 DIAGNOSIS — G894 Chronic pain syndrome: Secondary | ICD-10-CM

## 2019-02-23 DIAGNOSIS — N809 Endometriosis, unspecified: Secondary | ICD-10-CM

## 2019-02-23 DIAGNOSIS — G8929 Other chronic pain: Secondary | ICD-10-CM

## 2019-02-23 MED ORDER — TAPENTADOL HCL 75 MG PO TABS: 75.0000 mg | ORAL_TABLET | Freq: Every day | ORAL | 0 refills | Status: AC

## 2019-02-23 MED ORDER — TAPENTADOL HCL 75 MG PO TABS: 75.0000 mg | ORAL_TABLET | Freq: Every day | ORAL | 0 refills | Status: DC

## 2019-02-23 NOTE — Progress Notes (Signed)
Pain Management Virtual Encounter Note - Virtual Visit via [place] (real-time audio visits between healthcare provider and patient).   Patient's Phone No. & Preferred Pharmacy:  [phone] (home); [phone] (mobile); (Preferred) [phone] [email or web address]  [facility] [place], [address] AT [address] [place] [address] Phone: [phone] Fax: [phone]    Pre-screening note:  Our staff contacted Stacey Stanley and offered her an "in person", "face-to-face" appointment versus a telephone encounter. She indicated preferring the telephone encounter, at this time.   Reason for Virtual Visit: COVID-19*  Social distancing based on CDC and AMA recommendations.   I contacted Stacey Stanley on 02/23/2019 via video conference.      I clearly identified myself as [name], MD. I verified that I was speaking with the correct person using two identifiers (Name: Stacey Stanley, and date of birth: 02-22-1976).  Advanced Informed Consent I sought verbal advanced consent from Stacey Stanley for virtual visit interactions. I informed Stacey Stanley of possible security and privacy concerns, risks, and limitations associated with providing "not-in-person" medical evaluation and management services. I also informed Stacey Stanley of the availability of "in-person" appointments. Finally, I informed her that there would be a charge for the virtual visit and that she could be  personally, fully or partially, financially responsible for it. Stacey Stanley expressed understanding and agreed to proceed.   Historic Elements   Stacey Stanley is a 43 y.o. year old, female patient evaluated today after her last encounter by our practice on 12/20/2018. Stacey Stanley  has a past medical history of Anxiety, Arthritis, Chronic insomnia, Depression, Diabetes mellitus without complication ([place]), Dysrhythmia,  Endometriosis, GERD (gastroesophageal reflux disease), Headache(784.0), Hypertension, Kidney stones, Metabolic syndrome, Obesity, Serum calcium elevated, Sleep disorder, circadian, shift work type, SVD (spontaneous vaginal delivery), and Tachycardia. She also  has a past surgical history that includes metatarsil  (2003); Dilation and curettage of uterus; Induced abortion; laparoscopy (1992); Eye surgery (06/28/13); laparoscopy (N/A, 07/19/2013); Hysteroscopy w/D&C (N/A, 07/19/2013); Abdominal hysterectomy (05/21/2016); and Hemorroidectomy (04/04/2018). Stacey Stanley has a current medication list which includes the following prescription(s): acetaminophen, aspirin ec, b-12, duloxetine, hydrochlorothiazide, ibuprofen, metformin, fish oil, ozempic (1 mg/dose), tapentadol hcl, tapentadol hcl, tapentadol hcl, trazodone, and vitamin d (ergocalciferol). She  reports that she has been smoking cigarettes. She started smoking about 25 years ago. She has a 12.50 pack-year smoking history. She has never used smokeless tobacco. She reports that she does not drink alcohol or use drugs. Stacey Stanley is allergic to nuvigil [armodafinil].   HPI  Today, she is being contacted for medication management.   Patient did present to emergency room on 02/18/2019 for intermittent crampy abdominal pain.  Patient was prescribed Bentyl and recommend follow-up with GI.  Patient has not started Bentyl as she did not feel comfortable with the mention side effects.  Patient has had a colonoscopy but has not had an upper endoscopy.  I informed the patient that if she continues to have persistent symptoms, it may be worthwhile to see a GI physician to have an upper endoscopy.  Patient endorsed understanding.  Otherwise she continues her medications as prescribed.  Nucynta helps manage her pain and improve her functional status.  We will continue as below.  We will also repeat UDS for annual medication monitoring and compliance.  Pharmacotherapy  Assessment  Analgesic: 02/03/2019  2   12/20/2018  Nucynta 75 MG Tablet  30.00  30 Bi Lat   [id number]   Wal (5343)   0  30.00 MME  Comm Ins   [place]    Monitoring: Pharmacotherapy: No side-effects or adverse reactions reported. [place] PMP: PDMP reviewed during this encounter.       Compliance: No problems identified. Effectiveness: Clinically acceptable. Plan: Refer to "POC".  UDS:  Summary  Date Value Ref Range Status  12/22/2017 FINAL  Final    Comment:    ==================================================================== TOXASSURE COMP DRUG ANALYSIS,UR ==================================================================== Test                             Result       Flag       Units Drug Present and Declared for Prescription Verification   Tapentadol                     >3788        EXPECTED   ng/mg creat    Source of tapentadol is a scheduled prescription medication. Drug Present not Declared for Prescription Verification   Oxazepam                       575          UNEXPECTED ng/mg creat   Temazepam                      >758         UNEXPECTED ng/mg creat    Oxazepam and temazepam are expected metabolites of diazepam.    Oxazepam is also an expected metabolite of other benzodiazepine    drugs, including chlordiazepoxide, prazepam, clorazepate,    halazepam, and temazepam.  Oxazepam and temazepam are available    as scheduled prescription medications.   Acetaminophen                  PRESENT      UNEXPECTED Drug Absent but Declared for Prescription Verification   Duloxetine                     Not Detected UNEXPECTED ==================================================================== Test                      Result    Flag   Units      Ref Range   Creatinine              264              mg/dL      >=20 ==================================================================== Declared Medications:  The flagging and interpretation on this report are based on the  following declared  medications.  Unexpected results may arise from  inaccuracies in the declared medications.  **Note: The testing scope of this panel includes these medications:  Duloxetine  Tapentadol  **Note: The testing scope of this panel does not include following  reported medications:  Cyanocobalamin  Metformin  Multivitamin  Semaglutide  Varenicline  Vitamin D2 (Ergocalciferol) ==================================================================== For clinical consultation, please call [phone]. ====================================================================    Laboratory Chemistry Profile (12 mo)  Renal: 02/18/2019: BUN 8; Creatinine, Ser 0.79  Lab Results  Component Value Date   GFRAA >60 02/18/2019   GFRNONAA >60 02/18/2019   Hepatic: 02/18/2019: Albumin 4.0 Lab Results  Component Value Date   AST 16 02/18/2019   ALT 11 02/18/2019   Other: 08/01/2018: Vit D, 25-Hydroxy 34; Vitamin  B-12 440 Note: Above Lab results reviewed.  Assessment  The primary encounter diagnosis was Chronic pain syndrome. Diagnoses of Chronic pelvic pain in female, Pelvic pain, Opiate use, Endometriosis, and Dysmetabolic syndrome were also pertinent to this visit.  Plan of Care  I have discontinued Stacey Stanley's dicyclomine. I have also changed her tapentadol HCl. Additionally, I am having her start on tapentadol HCl and tapentadol HCl. Lastly, I am having her maintain her B-12, acetaminophen, ibuprofen, aspirin EC, Fish Oil, traZODone, DULoxetine, hydrochlorothiazide, metFORMIN, Ozempic (1 MG/DOSE), and Vitamin D (Ergocalciferol).  Pharmacotherapy (Medications Ordered): Meds ordered this encounter  Medications  . tapentadol HCl (NUCYNTA) 75 MG tablet    Sig: Take 1 tablet (75 mg total) by mouth daily.    Dispense:  30 tablet    Refill:  0  . tapentadol HCl (NUCYNTA) 75 MG tablet    Sig: Take 1 tablet (75 mg total) by mouth daily.    Dispense:  30 tablet    Refill:  0  . tapentadol  HCl (NUCYNTA) 75 MG tablet    Sig: Take 1 tablet (75 mg total) by mouth daily.    Dispense:  30 tablet    Refill:  0   Orders:  Orders Placed This Encounter  Procedures  . ToxASSURE Select 13 (MW), Urine    Volume: 30 ml(s). Minimum 3 ml of urine is needed. Document temperature of fresh sample. Indications: Long term (current) use of opiate analgesic [id number])   Follow-up plan:   Return in about 3 months (around 05/25/2019).     Recent Visits Date Type Provider Dept  12/20/18 Office Visit [name], MD [facility]  Showing recent visits within past 90 days and meeting all other requirements   Today's Visits Date Type Provider Dept  02/23/19 Office Visit [name], MD [facility]  Showing today's visits and meeting all other requirements   Future Appointments No visits were found meeting these conditions.  Showing future appointments within next 90 days and meeting all other requirements   I discussed the assessment and treatment plan with the patient. The patient was provided an opportunity to ask questions and all were answered. The patient agreed with the plan and demonstrated an understanding of the instructions.  Patient advised to call back or seek an in-person evaluation if the symptoms or condition worsens.  Total duration of non-face-to-face encounter: 25 minutes.  Note by: [name], MD Date: 02/23/2019; Time: 9:55 AM  Note: This dictation was prepared with Dragon dictation. Any transcriptional errors that may result from this process are unintentional.  Disclaimer:  * Given the special circumstances of the COVID-19 pandemic, the federal government has announced that the Office for Civil Rights (OCR) will exercise its enforcement discretion and will not impose penalties on physicians using telehealth in the event of noncompliance with regulatory requirements under the [place] and [place] (HIPAA) in  connection with the good faith provision of telehealth during the XX123456 national public health emergency. ([place])

## 2019-02-25 DIAGNOSIS — M546 Pain in thoracic spine: Secondary | ICD-10-CM | POA: Diagnosis not present

## 2019-03-02 ENCOUNTER — Encounter
Payer: Federal, State, Local not specified - PPO | Admitting: Student in an Organized Health Care Education/Training Program

## 2019-03-15 DIAGNOSIS — G894 Chronic pain syndrome: Secondary | ICD-10-CM | POA: Diagnosis not present

## 2019-03-18 LAB — TOXASSURE SELECT 13 (MW), URINE

## 2019-04-28 IMAGING — CT CT RENAL STONE PROTOCOL
1 of 2 series · 14 of 32 positions shown, 18 images · non-contrast
Comparison: 06/28/2017

CLINICAL DATA: Worsening left-sided abdominal pain.

EXAM:
CT ABDOMEN AND PELVIS WITHOUT CONTRAST
TECHNIQUE: Multidetector CT imaging of the abdomen and pelvis was performed
following the standard protocol without IV contrast.

[Series 2: stone · axial · 0.62mm/px · z∈[+646,+1046]mm · 14 of 92 slices shown, 18 images]
[im 8/92  soft-tissue]
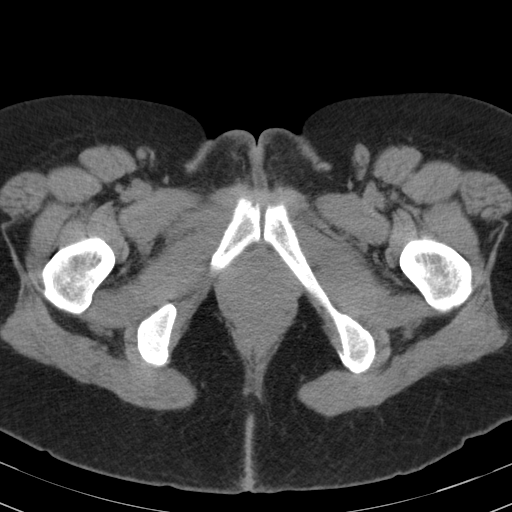
[im 8/92  bone]
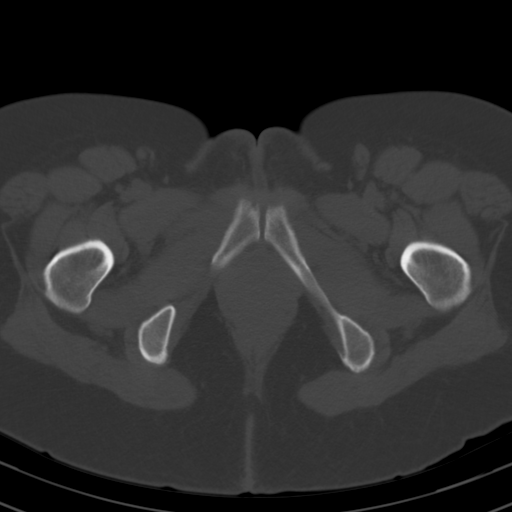
[im 15/92  soft-tissue]
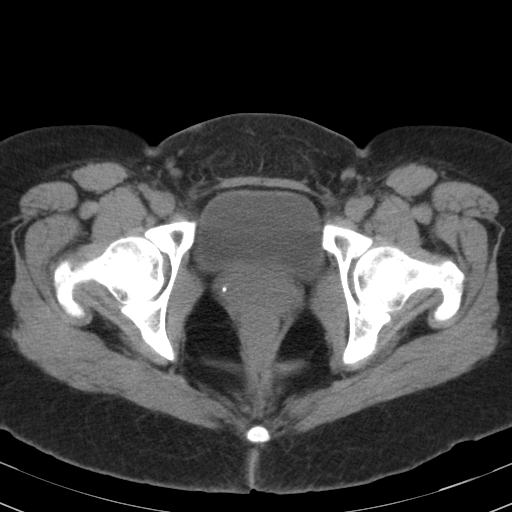
[im 22/92  soft-tissue]
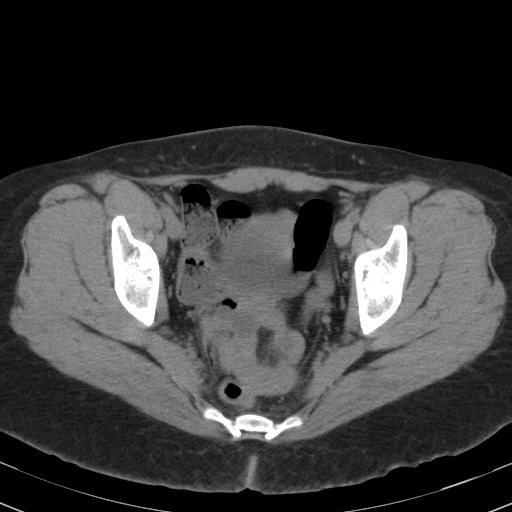
[im 30/92  soft-tissue]
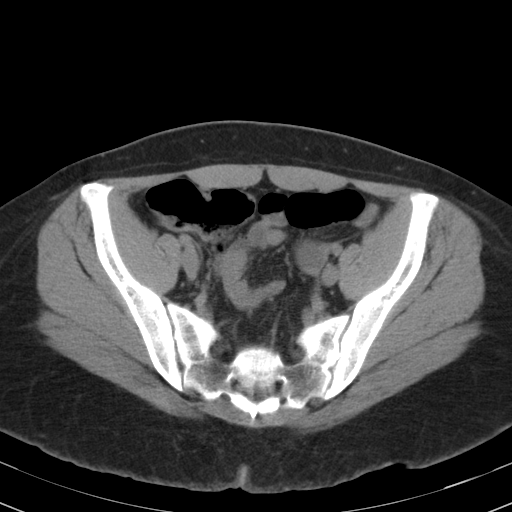
[im 37/92  soft-tissue]
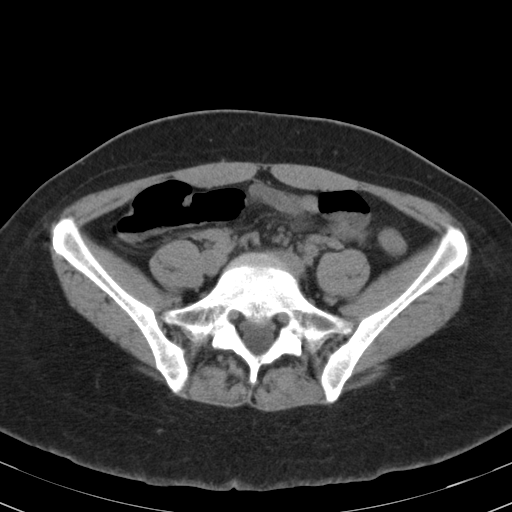
[im 44/92  soft-tissue]
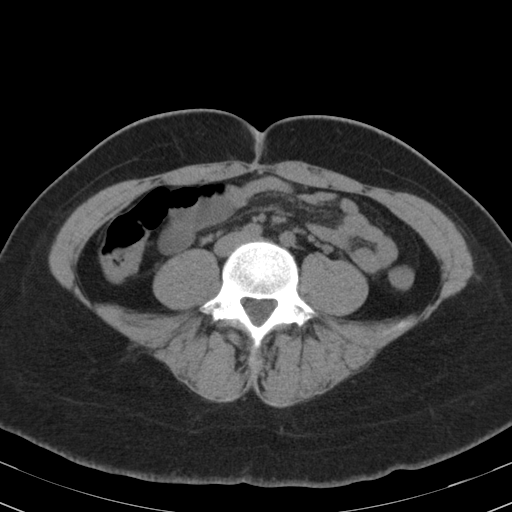
[im 51/92  soft-tissue]
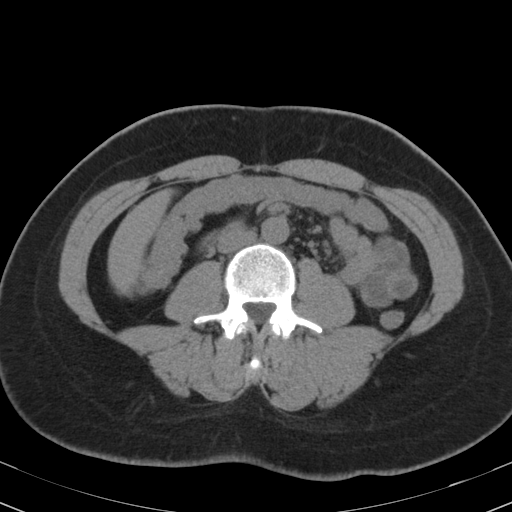
[im 59/92  soft-tissue]
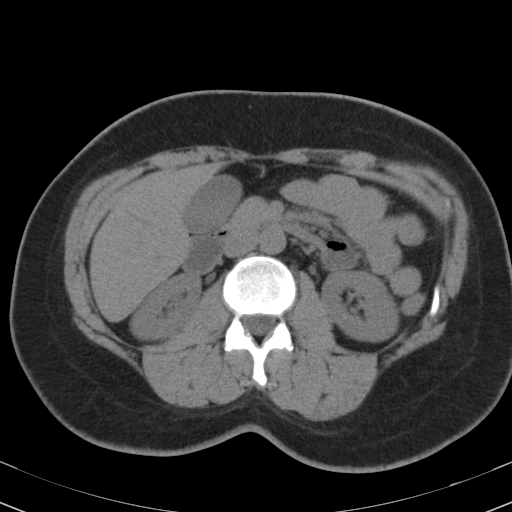
[im 66/92  soft-tissue]
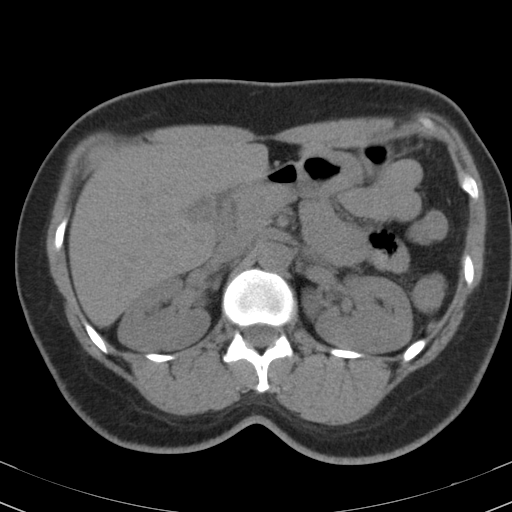
[im 66/92  bone]
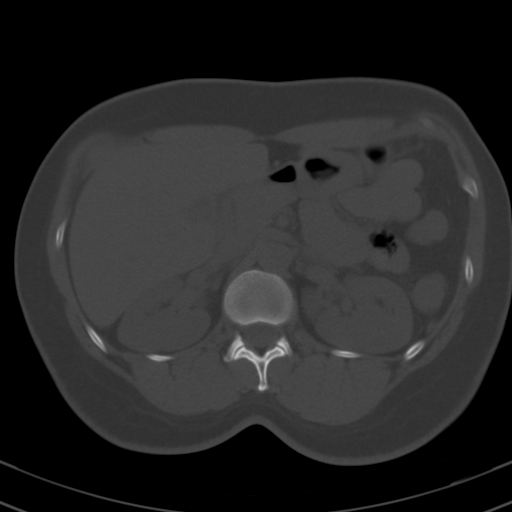
[im 73/92  soft-tissue]
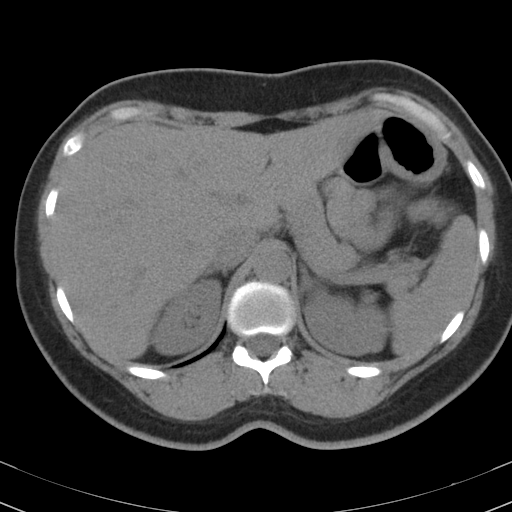
[im 77/92  lung]
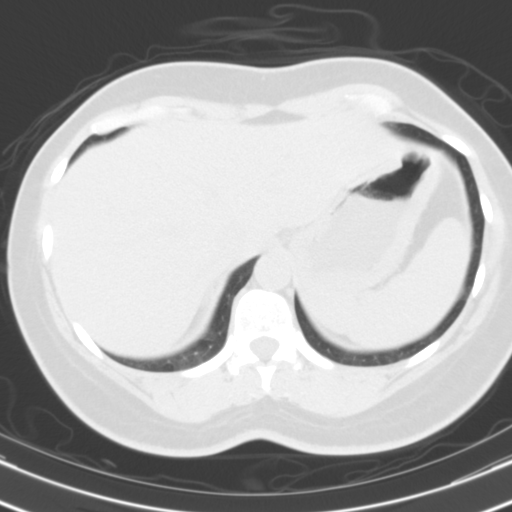
[im 81/92  soft-tissue]
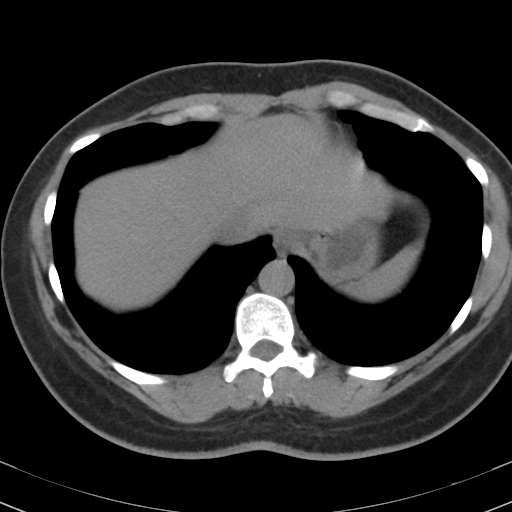
[im 81/92  lung]
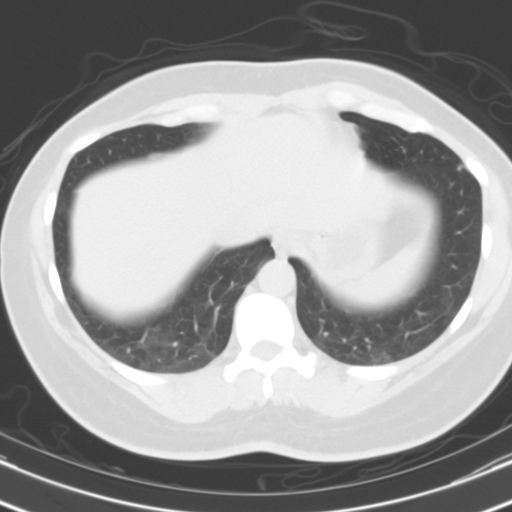
[im 84/92  lung]
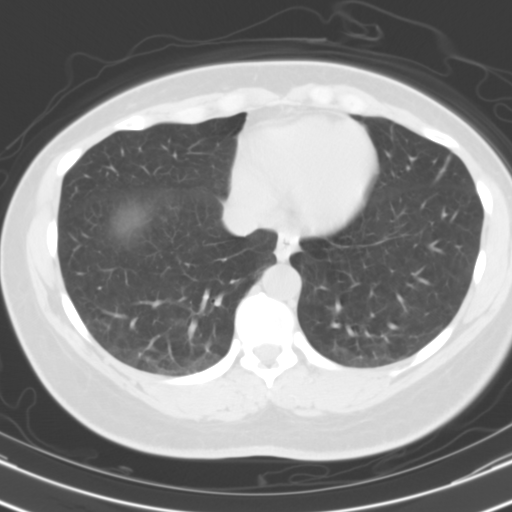
[im 88/92  soft-tissue]
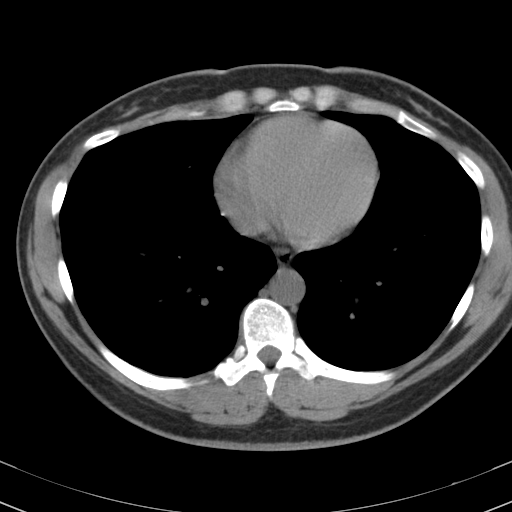
[im 88/92  lung]
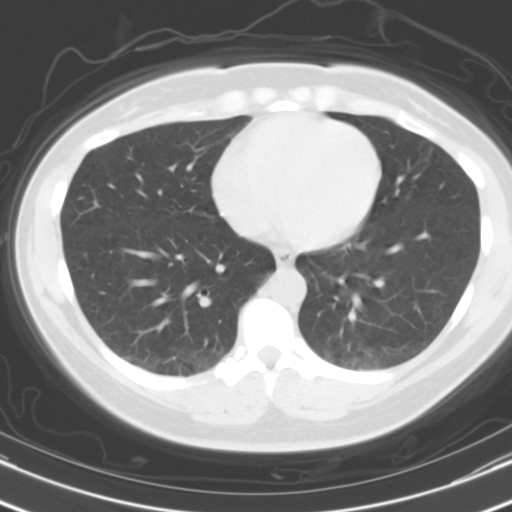

[14 of 32 positions shown; findings below may reference images not displayed]

FINDINGS: Lower chest: Unremarkable

Hepatobiliary: No focal abnormality in the liver on this study
without intravenous contrast. Probable sludge in the gallbladder. No
intrahepatic or extrahepatic biliary dilation.

Pancreas: No focal mass lesion. No dilatation of the main duct. No
intraparenchymal cyst. No peripancreatic edema.

Spleen: No splenomegaly. No focal mass lesion.

Adrenals/Urinary Tract: No adrenal nodule or mass. 1 mm
nonobstructing stone identified interpolar right kidney ([DATE]). No
right ureteral stone. No right hydroureteronephrosis. A 5 mm
nonobstructing stone again identified upper pole left kidney without
left ureteral stone or left hydroureteronephrosis. Bladder
unremarkable.

Stomach/Bowel: Stomach is nondistended. No gastric wall thickening.
No evidence of outlet obstruction. Duodenum is normally positioned
as is the ligament of Treitz. No small bowel wall thickening. No
small bowel dilatation. The terminal ileum is normal. The appendix
is not visualized, but there is no edema or inflammation in the
region of the cecum. No gross colonic mass. No colonic wall
thickening. No substantial diverticular change.

Vascular/Lymphatic: No abdominal aortic aneurysm. No abdominal
aortic atherosclerotic calcification. There is no gastrohepatic or
hepatoduodenal ligament lymphadenopathy. No intraperitoneal or
retroperitoneal lymphadenopathy. No pelvic sidewall lymphadenopathy.

Reproductive: Uterus surgically absent. There is no right adnexal
mass. 6 cm cystic mass left adnexal space on previous study is no
longer evident. 21 mm benign appearing cystic focus in the left
ovary may be a dominant follicle or remnant of the cystic process
seen previously.

Other: No intraperitoneal free fluid.

Musculoskeletal: Bone windows reveal no worrisome lytic or sclerotic
osseous lesions.
IMPRESSION: 1. Bilateral nonobstructing renal stones.
2. Otherwise unremarkable CT scan of the abdomen and pelvis.

## 2019-04-28 IMAGING — US US PELV - US TRANSVAGINAL
1 series · 14 of 25 positions shown · non-contrast
Comparison: None

CLINICAL DATA: Pelvic pain.



[Series 1: us pelv - us transvaginal · 14 of 57 slices shown]
[im 1/57]
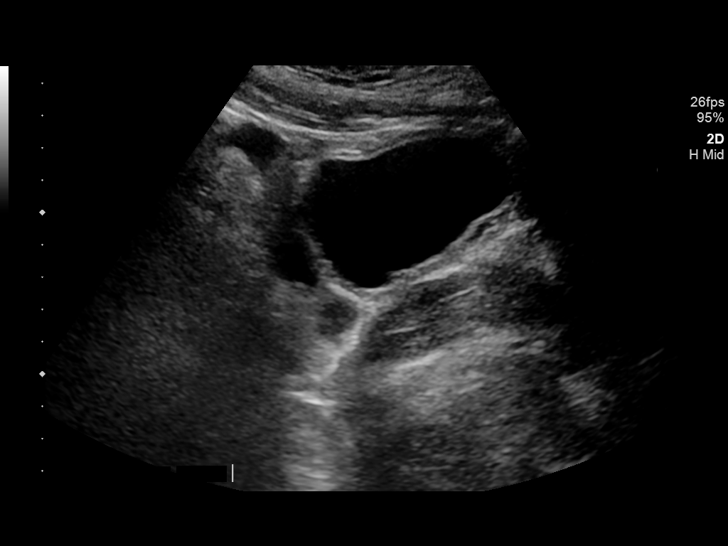
[im 5/57]
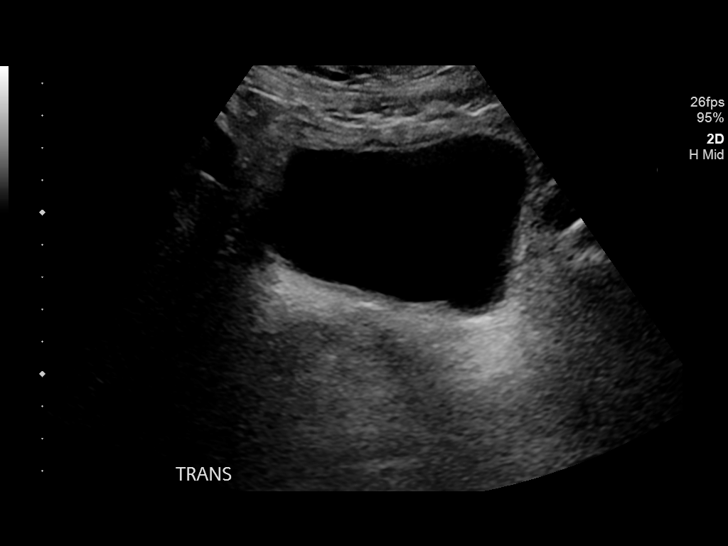
[im 10/57]
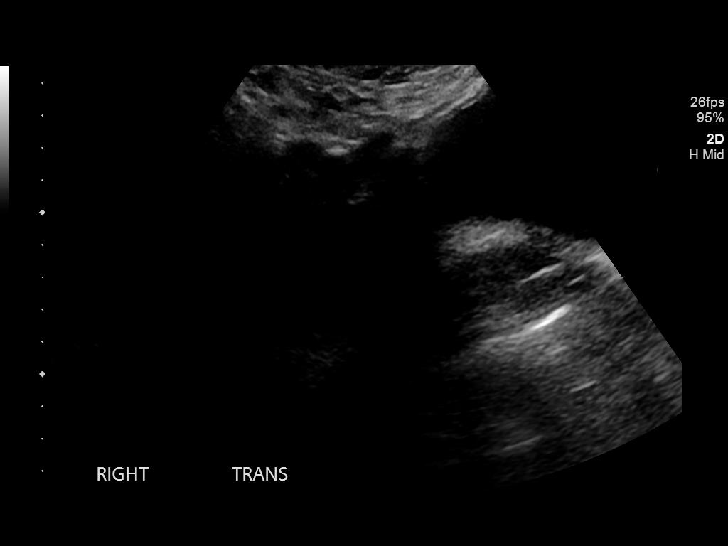
[im 15/57]
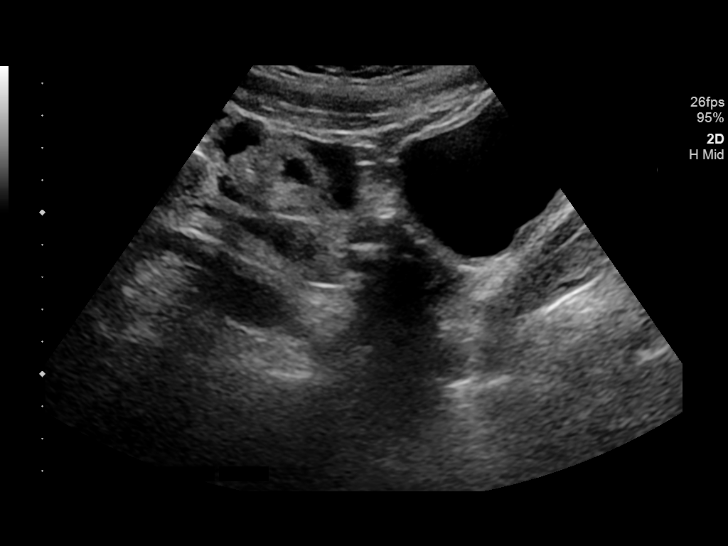
[im 19/57]
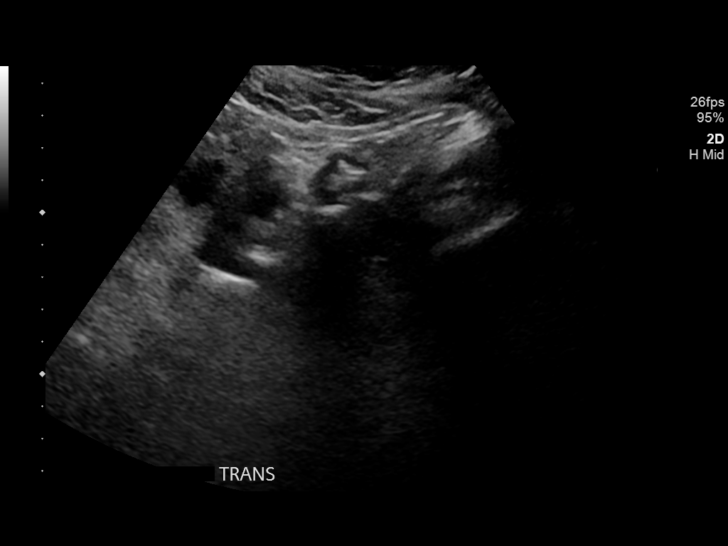
[im 22/57]
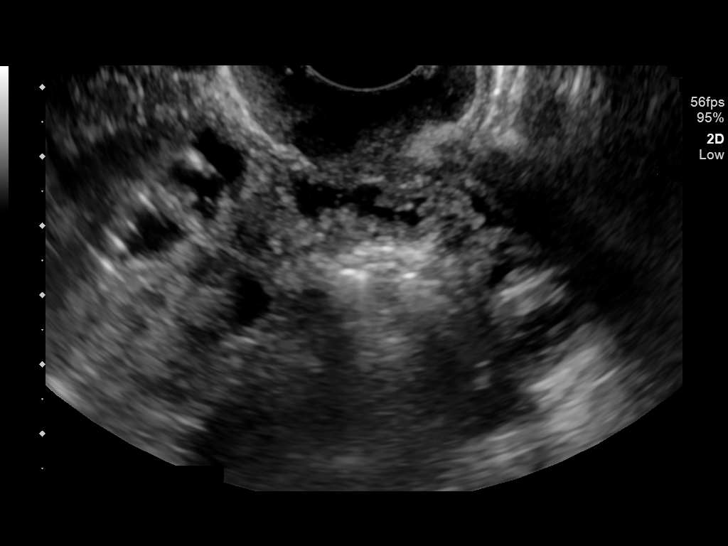
[im 26/57]
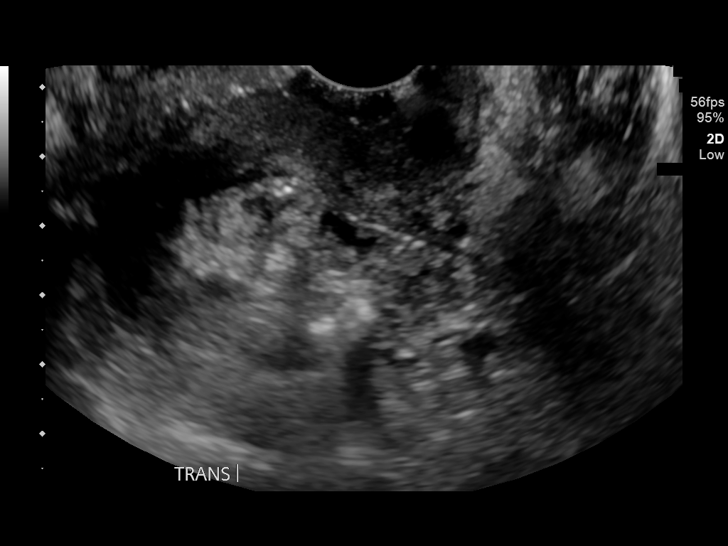
[im 31/57]
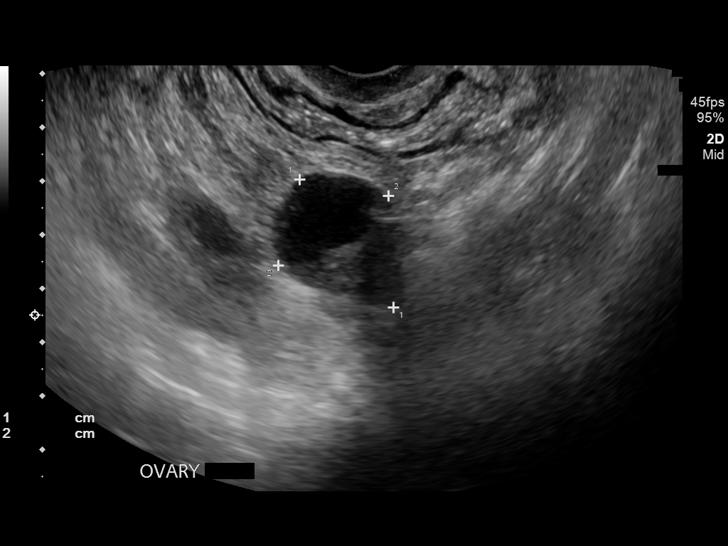
[im 36/57]
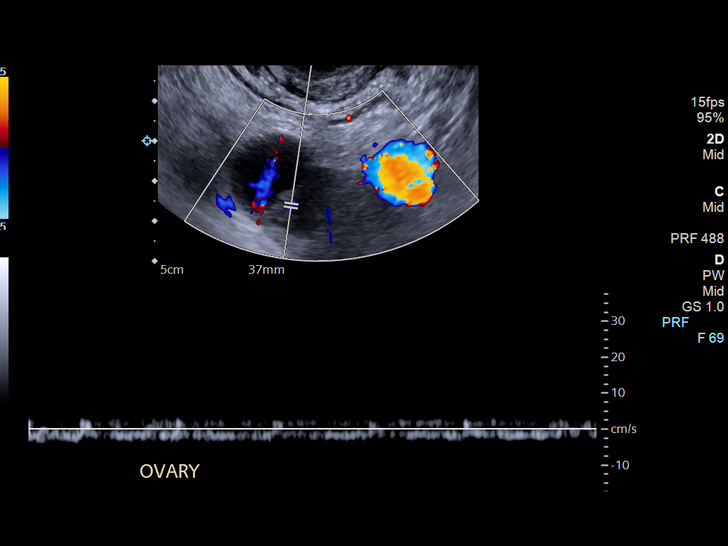
[im 38/57]
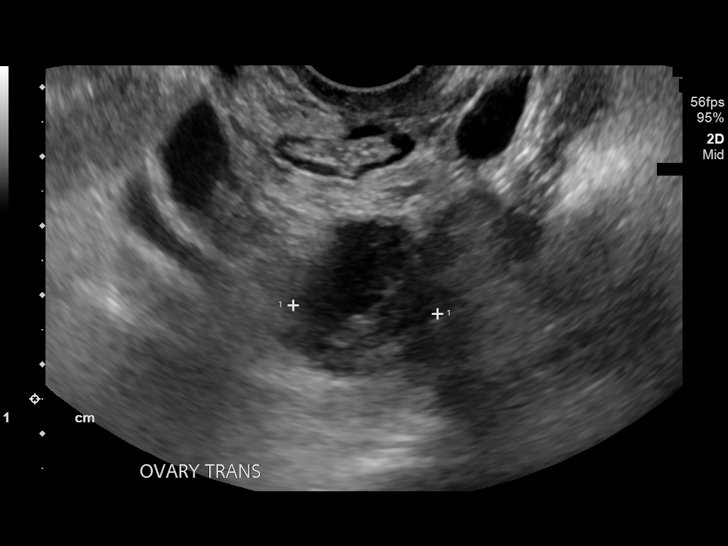
[im 43/57]
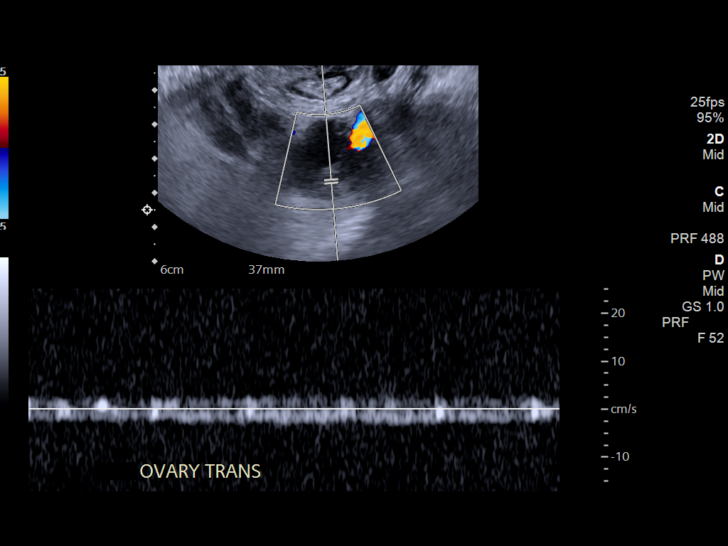
[im 47/57]
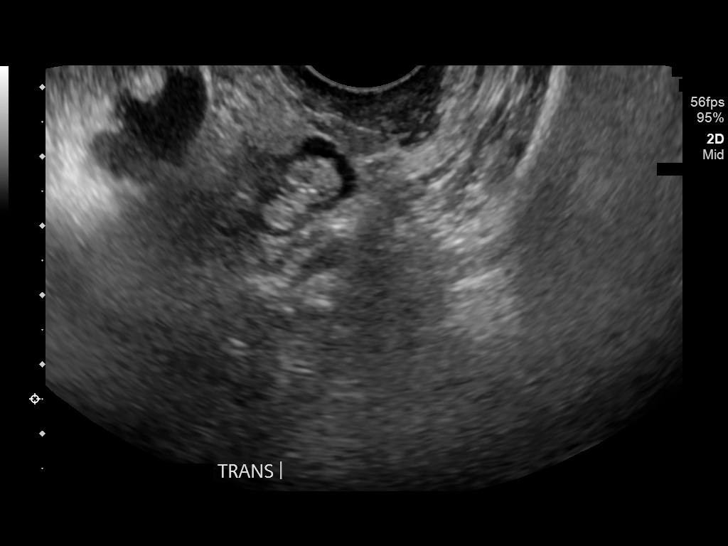
[im 52/57]
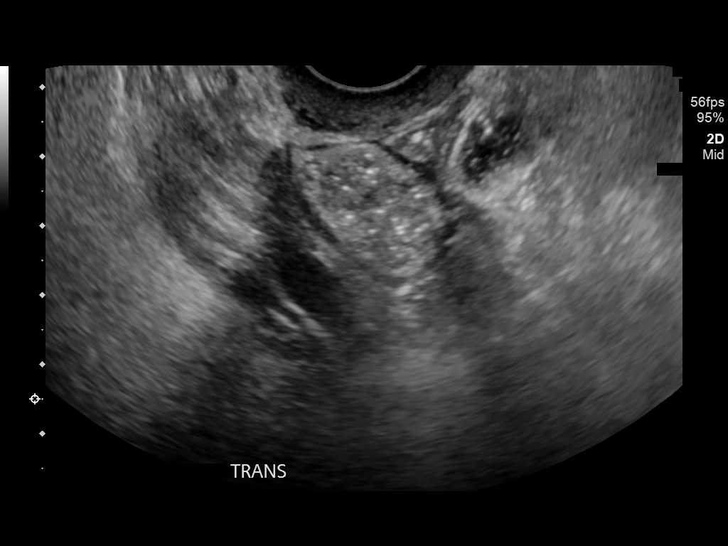
[im 57/57]
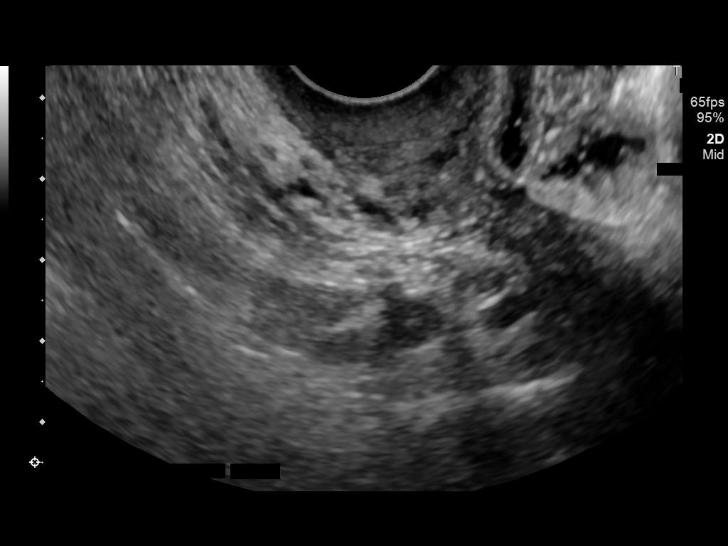

[14 of 25 positions shown; findings below may reference images not displayed]

FINDINGS: Uterus

The uterus is surgically absent.

Right ovary

Not visualized.

Left ovary

Measurements: 3 x 2.4 x 2.1 cm.  Contains a 2.2 cm simple cyst.

Other findings

No abnormal free fluid.
IMPRESSION: 1. There is a 2.2 cm simple cyst in the left ovary.
2. The uterus is surgically absent.
3. The right ovary was not seen.

## 2019-05-04 ENCOUNTER — Other Ambulatory Visit: Payer: Self-pay | Admitting: Family Medicine

## 2019-05-04 DIAGNOSIS — G4700 Insomnia, unspecified: Secondary | ICD-10-CM

## 2019-05-04 MED ORDER — TRAZODONE HCL 50 MG PO TABS: 25.0000 mg | ORAL_TABLET | Freq: Every evening | ORAL | 0 refills | Status: DC | PRN

## 2019-05-22 ENCOUNTER — Telehealth: Payer: Self-pay

## 2019-05-22 NOTE — Telephone Encounter (Signed)
Attempted to call patient for virtual appointment questions.  Patient does not have a voicemail set up and unable to leave a message.

## 2019-05-23 ENCOUNTER — Other Ambulatory Visit: Payer: Self-pay

## 2019-05-23 ENCOUNTER — Encounter: Payer: Self-pay | Admitting: Student in an Organized Health Care Education/Training Program

## 2019-05-23 ENCOUNTER — Ambulatory Visit
Payer: Federal, State, Local not specified - PPO | Attending: Student in an Organized Health Care Education/Training Program | Admitting: Student in an Organized Health Care Education/Training Program

## 2019-05-23 DIAGNOSIS — F1190 Opioid use, unspecified, uncomplicated: Secondary | ICD-10-CM

## 2019-05-23 DIAGNOSIS — Z79891 Long term (current) use of opiate analgesic: Secondary | ICD-10-CM

## 2019-05-23 DIAGNOSIS — R102 Pelvic and perineal pain: Secondary | ICD-10-CM | POA: Diagnosis not present

## 2019-05-23 DIAGNOSIS — G8929 Other chronic pain: Secondary | ICD-10-CM

## 2019-05-23 DIAGNOSIS — G894 Chronic pain syndrome: Secondary | ICD-10-CM | POA: Diagnosis not present

## 2019-05-23 DIAGNOSIS — N809 Endometriosis, unspecified: Secondary | ICD-10-CM | POA: Diagnosis not present

## 2019-05-23 DIAGNOSIS — M5412 Radiculopathy, cervical region: Secondary | ICD-10-CM | POA: Diagnosis not present

## 2019-05-23 MED ORDER — TAPENTADOL HCL 75 MG PO TABS: 75.0000 mg | ORAL_TABLET | Freq: Every day | ORAL | 0 refills | Status: DC

## 2019-05-23 MED ORDER — TAPENTADOL HCL 75 MG PO TABS: 75.0000 mg | ORAL_TABLET | Freq: Every day | ORAL | 0 refills | Status: AC

## 2019-05-23 NOTE — Progress Notes (Signed)
Virtual Encounter - Pain Management PROVIDER NOTE: Information contained herein reflects review and annotations entered in association with encounter. Patient information is provided elsewhere in the medical record. Interpretation of information and data should be left to medically trained personnel. Document created using STT technology, any transcriptional errors that may result from process are unintentional.  Contact & Pharmacy Preferred: [phone] Home: [phone] (home) Mobile: [phone] (mobile) E-mail: [email or web address]  [facility] [place] - [place] AT [address] [place] [address] Phone: [phone] Fax: [phone]   Pre-screening  Ms. Pallett offered "in-person" vs "virtual" encounter. She indicated preferring virtual for this encounter.   Reason COVID-19*  Social distancing based on CDC and AMA recommendations.   I contacted [name] on 05/23/2019 I clearly identified myself as [name], MD. I verified that I was speaking with the correct person using two identifiers (Name: TYLEISHA SHAH, and date of birth: 06-27-1975).  Consent I sought verbal advanced consent from [name] for virtual visit interactions. I informed Ms. Lyford of possible security and privacy concerns, risks, and limitations associated with providing "not-in-person" medical evaluation and management services. I also informed Ms. Emmick of the availability of "in-person" appointments. Finally, I informed her that there would be a charge for the virtual visit and that she could be  personally, fully or partially, financially responsible for it. Ms. Bryner expressed understanding and agreed to proceed.   Historic Elements   Stacey Stanley is a 43 y.o. year old, female patient evaluated today after her last encounter by our practice on 05/22/2019. Ms. Weir  has a past medical history of Anxiety,  Arthritis, Chronic insomnia, Depression, Diabetes mellitus without complication ([place]), Dysrhythmia, Endometriosis, GERD (gastroesophageal reflux disease), Headache(784.0), Hypertension, Kidney stones, Metabolic syndrome, Obesity, Serum calcium elevated, Sleep disorder, circadian, shift work type, SVD (spontaneous vaginal delivery), and Tachycardia. She also  has a past surgical history that includes metatarsil  (2003); Dilation and curettage of uterus; Induced abortion; laparoscopy (1992); Eye surgery (06/28/13); laparoscopy (N/A, 07/19/2013); Hysteroscopy with D & C (N/A, 07/19/2013); Abdominal hysterectomy (05/21/2016); and Hemorroidectomy (04/04/2018). Ms. Hargus has a current medication list which includes the following prescription(s): acetaminophen, aspirin ec, b-12, hydrochlorothiazide, ibuprofen, metformin, fish oil, ozempic (1 mg/dose), [START ON 06/02/2019] tapentadol hcl, [START ON 07/02/2019] tapentadol hcl, [START ON 08/01/2019] tapentadol hcl, trazodone, and vitamin d (ergocalciferol). She  reports that she has been smoking cigarettes. She started smoking about 25 years ago. She has a 12.50 pack-year smoking history. She has never used smokeless tobacco. She reports that she does not drink alcohol or use drugs. Ms. Nordine is allergic to nuvigil [armodafinil].   HPI  Today, she is being contacted for medication management.   No change in medical history since last visit.  Patient's pain is at baseline.  Patient continues multimodal pain regimen as prescribed.  States that it provides pain relief and improvement in functional status.  Pharmacotherapy Assessment  Analgesic: 05/03/2019  1   02/23/2019  Nucynta 75 MG Tablet  30.00  30 Bi Lat   [id number]   Wal (5343)   0  30.00 MME  Comm Ins   [place]    Monitoring: Pharmacotherapy: No side-effects or adverse reactions reported. [place] PMP: PDMP reviewed during this encounter.       Compliance: No problems identified. Effectiveness: Clinically  acceptable. Plan: Refer to "POC".  UDS:  Summary  Date Value Ref Range Status  03/15/2019 Note  Final    Comment:    ==================================================================== ToxASSURE Select 13 (MW) ==================================================================== Test                             Result       Flag       Units Drug Present and Declared for Prescription Verification   Tapentadol                     >4673        EXPECTED   ng/mg creat    Source of tapentadol is a scheduled prescription medication. ==================================================================== Test                      Result    Flag   Units      Ref Range   Creatinine              214              mg/dL      >=20 ==================================================================== Declared Medications:  The flagging and interpretation on this report are based on the  following declared medications.  Unexpected results may arise from  inaccuracies in the declared medications.  **Note: The testing scope of this panel includes these medications:  Tapentadol  **Note: The testing scope of this panel does not include the  following reported medications:  Acetaminophen  Aspirin  Duloxetine  Hydrochlorothiazide  Ibuprofen  Metformin  Omega-3 Fatty Acids  Semaglutide  Trazodone  Vitamin B12  Vitamin D2 ==================================================================== For clinical consultation, please call [phone]. ====================================================================    Laboratory Chemistry Profile (12 mo)  Renal: 02/18/2019: BUN 8; Creatinine, Ser 0.79  Lab Results  Component Value Date   GFRAA >60 02/18/2019   GFRNONAA >60 02/18/2019   Hepatic: 02/18/2019: Albumin 4.0 Lab Results  Component Value Date   AST 16 02/18/2019   ALT 11 02/18/2019   Other: 08/01/2018: Vit D, 25-Hydroxy 34; Vitamin B-12 440 Note: Above Lab results reviewed.  Imaging   US Pelvis Complete CLINICAL DATA:  Left lower quadrant pain, prior hysterectomy  EXAM: TRANSABDOMINAL ULTRASOUND OF PELVIS  TECHNIQUE: Transabdominal ultrasound examination of the pelvis was performed including evaluation of the uterus, ovaries, adnexal regions, and pelvic cul-de-sac.  COMPARISON:  CT abdomen/pelvis dated 09/09/2017  FINDINGS: Uterus  Surgically absent.  Right ovary  Not discretely visualized.  No adnexal mass is seen.  Left ovary  Measurements: 1.9 x 1.5 x 1.6 cm. Poorly visualized but grossly unremarkable.  Other findings:  No abnormal free fluid.  IMPRESSION: Left ovary is grossly unremarkable.  Right ovary is not discretely visualized.  Status post hysterectomy.  Electronically Signed   By: Julian Hy M.D.   On: 11/12/2017 16:49   Assessment  The primary encounter diagnosis was Chronic pelvic pain in female. Diagnoses of Chronic pain syndrome, Pelvic pain, Endometriosis, Opiate use, and Cervical radiculitis were also pertinent to this visit.  Plan of Care  Problem-specific:  No problem-specific Assessment & Plan notes found for this encounter.  I have discontinued Mrs. Eppie R. Ulatowski's DULoxetine. I am also having her start on tapentadol HCl and tapentadol HCl. Additionally, I am having her maintain her B-12, acetaminophen, ibuprofen, aspirin EC, Fish Oil, hydrochlorothiazide, metFORMIN, Ozempic (1 MG/DOSE), Vitamin D (Ergocalciferol), traZODone, and tapentadol HCl.  Pharmacotherapy (Medications Ordered): Meds ordered this encounter  Medications  . tapentadol HCl (NUCYNTA) 75 MG tablet  Sig: Take 1 tablet (75 mg total) by mouth daily.    Dispense:  30 tablet    Refill:  0  . tapentadol HCl (NUCYNTA) 75 MG tablet    Sig: Take 1 tablet (75 mg total) by mouth daily.    Dispense:  30 tablet    Refill:  0  . tapentadol HCl (NUCYNTA) 75 MG tablet    Sig: Take 1 tablet (75 mg total) by mouth daily.    Dispense:  30 tablet     Refill:  0   Follow-up plan:   Return in about 3 months (around 08/21/2019) for Medication Management, virtual.    Recent Visits Date Type Provider Dept  02/23/19 Office Visit [name], MD [facility]  Showing recent visits within past 90 days and meeting all other requirements   Today's Visits Date Type Provider Dept  05/23/19 Office Visit [name], MD [facility]  Showing today's visits and meeting all other requirements   Future Appointments No visits were found meeting these conditions.  Showing future appointments within next 90 days and meeting all other requirements   I discussed the assessment and treatment plan with the patient. The patient was provided an opportunity to ask questions and all were answered. The patient agreed with the plan and demonstrated an understanding of the instructions.  Patient advised to call back or seek an in-person evaluation if the symptoms or condition worsens.  Total duration of non-face-to-face encounter: 25 minutes.  Note by: [name], MD Date: 05/23/2019; Time: 9:51 AM

## 2019-07-01 ENCOUNTER — Other Ambulatory Visit: Payer: Self-pay

## 2019-07-01 ENCOUNTER — Encounter (HOSPITAL_COMMUNITY): Payer: Self-pay | Admitting: *Deleted

## 2019-07-01 ENCOUNTER — Ambulatory Visit (HOSPITAL_COMMUNITY)
Admission: EM | Admit: 2019-07-01 | Discharge: 2019-07-01 | Disposition: A | Discharge status: Home / Self Care | Payer: Federal, State, Local not specified - PPO | Attending: Physician Assistant | Admitting: Physician Assistant

## 2019-07-01 DIAGNOSIS — R031 Nonspecific low blood-pressure reading: Secondary | ICD-10-CM | POA: Diagnosis not present

## 2019-07-01 DIAGNOSIS — R110 Nausea: Secondary | ICD-10-CM | POA: Insufficient documentation

## 2019-07-01 DIAGNOSIS — Z20822 Contact with and (suspected) exposure to covid-19: Secondary | ICD-10-CM | POA: Diagnosis not present

## 2019-07-01 MED ORDER — ONDANSETRON HCL 4 MG PO TABS: 4.0000 mg | ORAL_TABLET | Freq: Three times a day (TID) | ORAL | 0 refills | Status: AC | PRN

## 2019-07-01 NOTE — Discharge Instructions (Addendum)
Take tylenol 325mg  tablet x 2 up to every 6 hours for sore throat, fever and body aches. Do not exceed 8 tablets in 24 hours  Hold your blood pressure medication for now, consult with your primary care about restarting this.  Take the zofran to aid with nausea  Drink plenty of fluids and eat well.   If not improving in 2-3 days seek reevaulation. If symptoms get much worse, consider going to the emergency department. If you have difficulty breathing or skin rash show, go to the emergency department.  Follow up with your primary care this week to check in

## 2019-07-01 NOTE — ED Provider Notes (Signed)
[place]    CSN: [id number] Arrival date & time: 07/01/19  1655      History   Chief Complaint Chief Complaint  Patient presents with  . Nausea  . S/P 2nd Covid vaccination    HPI Stacey Stanley is a 44 y.o. female.   Patient reports to urgent care today for potential reaction to recent 2nd dose of moderna COVID vaccine. She received the vaccine on 06/27/19. She had arm soreness, fatigue and bodyache for 2 days following, however 3 days ago she developed nausea and "swimming" feeling in her head. She did not vomit and has been able to eat and drink for the most part. Last night she does report having a low blood pressure reading of 96/66. She is on blood pressure medication and this was low for her. She stopped taking her Blood pressure medication due to this. She has felt some lightheadedness when standing. She denies skin rash, fast heart rate, fever, chills.   She works as a Marine scientist at [employer] and does work with [place] patient regularly.   She did not have any similar symptoms with first dose and has never reacted to a vaccine before.      Past Medical History:  Diagnosis Date  . Anxiety    no current meds  . Arthritis    left knee pain - otc med prn  . Chronic insomnia   . Depression   . Diabetes mellitus without complication ([place])   . Dysrhythmia    Home with daughter [name]  . Endometriosis   . GERD (gastroesophageal reflux disease)    diet control - no meds  . Headache(784.0)    hx - last one 2 yrs ago - no meds  . Hypertension   . Kidney stones   . Metabolic syndrome   . Obesity   . Serum calcium elevated   . Sleep disorder, circadian, shift work type   . SVD (spontaneous vaginal delivery)    x 2  . Tachycardia    History - neg cardiac tests-stress related    Patient Active Problem List   Diagnosis Date Noted  . Chronic pain syndrome 12/28/2017  . Opiate use 12/28/2017  . Long term prescription benzodiazepine use 12/28/2017  .  Thrombocytosis ([place]) 10/26/2017  . Internal hemorrhoids 10/13/2017  . Chronic idiopathic constipation 10/13/2017  . Rectal bleed 09/10/2017  . Hypomagnesemia 09/10/2017  . Pelvic pain 09/09/2017  . Mass of left ovary 06/28/2017  . Kidney stone on left side 06/28/2017  . Cervical radiculitis 03/08/2017  . Tobacco dependence 10/28/2016  . Vitamin B12 deficiency 08/19/2016  . Prediabetes 08/19/2016  . Right thyroid nodule 07/31/2015  . Chronic pelvic pain in female 04/05/2015  . GERD (gastroesophageal reflux disease) 04/05/2015  . Mild major depression ([place]) 11/25/2014  . Insomnia, persistent 11/25/2014  . Endometriosis 11/25/2014  . Essential (primary) hypertension 11/25/2014  . Dysmetabolic syndrome 123456  . Obesity (BMI 30-39.9) 11/25/2014    Past Surgical History:  Procedure Laterality Date  . ABDOMINAL HYSTERECTOMY  05/21/2016  . DILATION AND CURETTAGE OF UTERUS    . EYE SURGERY  06/28/13   right laser eye surgery - repair retina  . HEMORROIDECTOMY  04/04/2018   Dr. Levin Bacon   . HYSTEROSCOPY WITH D & C N/A 07/19/2013   Procedure: DILATATION AND CURETTAGE /HYSTEROSCOPY;  Surgeon: Marvene Staff, MD;  Location: [facility];  Service: Gynecology;  Laterality: N/A;  . INDUCED ABORTION    . LAPAROSCOPY  1992  endometriosis  . LAPAROSCOPY N/A 07/19/2013   Procedure: LAPAROSCOPY DIAGNOSTIC  with resection of endometriosis;  Surgeon: Marvene Staff, MD;  Location: [facility];  Service: Gynecology;  Laterality: N/A;  . metatarsil   2003   left foot surgery     OB History    Gravida  3   Para  2   Term  2   Preterm  0   AB  1   Living  2     SAB  1   TAB  0   Ectopic  0   Multiple  0   Live Births               Home Medications    Prior to Admission medications   Medication Sig Start Date End Date Taking? Authorizing Provider  acetaminophen (TYLENOL) 500 MG tablet Take 1,000 mg by mouth every 8 (eight) hours as needed.   Yes [provider]  aspirin EC 81 MG tablet Take 81 mg by mouth daily.   Yes [provider]  Cyanocobalamin (B-12) 1000 MCG SUBL Place 1 mL under the tongue daily. Patient taking differently: Place 1,000 Units under the tongue daily.  01/10/16  Yes Sowles, Drue Stager, MD  hydrochlorothiazide (HYDRODIURIL) 12.5 MG tablet Take 1 tablet (12.5 mg total) by mouth daily. 02/01/19  Yes Sowles, Drue Stager, MD  ibuprofen (ADVIL,MOTRIN) 800 MG tablet Take 800 mg by mouth every 8 (eight) hours as needed.   Yes [provider]  metFORMIN (GLUCOPHAGE XR) 750 MG 24 hr tablet Take 1 tablet (750 mg total) by mouth daily with breakfast. 02/01/19  Yes Sowles, Drue Stager, MD  Omega-3 Fatty Acids (FISH OIL) 1000 MG CAPS Take 4,000 mg by mouth daily.   Yes [provider]  Semaglutide, 1 MG/DOSE, (OZEMPIC, 1 MG/DOSE,) 2 MG/1.5ML SOPN Inject 1 mg into the skin once a week. 02/01/19  Yes Sowles, Drue Stager, MD  tapentadol HCl (NUCYNTA) 75 MG tablet Take 1 tablet (75 mg total) by mouth daily. 07/02/19 08/01/19 Yes [name], MD  traZODone (DESYREL) 50 MG tablet Take 0.5-2 tablets (25-100 mg total) by mouth at bedtime as needed for sleep. 05/04/19  Yes Sowles, Drue Stager, MD  Vitamin D, Ergocalciferol, (DRISDOL) 1.25 MG (50000 UT) CAPS capsule Take weekly 02/01/19  Yes Sowles, Drue Stager, MD  ondansetron (ZOFRAN) 4 MG tablet Take 1 tablet (4 mg total) by mouth every 8 (eight) hours as needed for up to 5 days for nausea or vomiting. 07/01/19 07/06/19  Momen Ham, Marguerita Beards, PA-C  tapentadol HCl (NUCYNTA) 75 MG tablet Take 1 tablet (75 mg total) by mouth daily. 06/02/19 07/02/19  [name], MD  tapentadol HCl (NUCYNTA) 75 MG tablet Take 1 tablet (75 mg total) by mouth daily. 08/01/19 08/31/19  [name], MD    Family History Family History  Problem Relation Age of Onset  . Diabetes Mother   . Hypertension Mother   . Hypertension Father   . Kidney disease Father   . Stroke Maternal Grandmother   . Leukemia Paternal Grandmother   .  Alzheimer's disease Paternal Grandfather   . Heart disease Brother   . Anesthesia problems Neg Hx   . Hypotension Neg Hx   . Malignant hyperthermia Neg Hx   . Pseudochol deficiency Neg Hx     Social History Social History   Tobacco Use  . Smoking status: Current Every Day Smoker    Packs/day: 0.50    Years: 25.00    Pack years: 12.50  Types: Cigarettes    Start date: 11/25/1993  . Smokeless tobacco: Never Used  Substance Use Topics  . Alcohol use: No    Alcohol/week: 0.0 standard drinks  . Drug use: No     Allergies   Nuvigil [armodafinil]   Review of Systems Review of Systems  Constitutional: Positive for activity change and fatigue. Negative for chills and fever.  HENT: Negative for congestion, ear pain, sinus pressure, sinus pain and sore throat.   Eyes: Negative for pain and visual disturbance.  Respiratory: Negative for cough, chest tightness, shortness of breath and wheezing.   Cardiovascular: Negative for chest pain and palpitations.  Gastrointestinal: Positive for nausea. Negative for abdominal pain, diarrhea and vomiting.  Genitourinary: Negative for dysuria and hematuria.  Musculoskeletal: Positive for myalgias. Negative for arthralgias and back pain.  Skin: Negative for color change and rash.  Neurological: Positive for light-headedness. Negative for dizziness, seizures, syncope and speech difficulty.  All other systems reviewed and are negative.    Physical Exam Triage Vital Signs ED Triage Vitals  Enc Vitals Group     BP 07/01/19 1707 108/73     Pulse Rate 07/01/19 1707 88     Resp 07/01/19 1707 18     Temp 07/01/19 1707 98.4 F (36.9 C)     Temp Source 07/01/19 1707 Oral     SpO2 07/01/19 1707 100 %     Weight --      Height --      Head Circumference --      Peak Flow --      Pain Score 07/01/19 1708 0     Pain Loc --      Pain Edu? --      Excl. in [place]? --    Orthostatic VS for the past 24 hrs:  BP- Lying Pulse- Lying BP- Sitting  Pulse- Sitting BP- Standing at 0 minutes Pulse- Standing at 0 minutes  07/01/19 1751 100/71 76 108/77 75 104/70 78    Updated Vital Signs BP 108/73   Pulse 88   Temp 98.4 F (36.9 C) (Oral)   Resp 18   LMP 07/09/2015 (Exact Date)   SpO2 100%   Visual Acuity Right Eye Distance:   Left Eye Distance:   Bilateral Distance:    Right Eye Near:   Left Eye Near:    Bilateral Near:     Physical Exam Vitals and nursing note reviewed.  Constitutional:      General: She is not in acute distress.    Appearance: She is well-developed and normal weight. She is ill-appearing.  HENT:     Head: Normocephalic and atraumatic.     Mouth/Throat:     Mouth: Mucous membranes are moist.     Pharynx: Oropharynx is clear.  Eyes:     General: No scleral icterus.    Extraocular Movements: Extraocular movements intact.     Conjunctiva/sclera: Conjunctivae normal.     Pupils: Pupils are equal, round, and reactive to light.  Cardiovascular:     Rate and Rhythm: Normal rate and regular rhythm.     Heart sounds: No murmur.  Pulmonary:     Effort: Pulmonary effort is normal. No respiratory distress.     Breath sounds: Normal breath sounds. No wheezing.  Abdominal:     Palpations: Abdomen is soft.     Tenderness: There is no abdominal tenderness.  Musculoskeletal:     Cervical back: Neck supple.     Right lower leg: No edema.  Left lower leg: No edema.  Skin:    General: Skin is warm and dry.     Capillary Refill: Capillary refill takes less than 2 seconds.     Coloration: Skin is not pale.     Findings: No bruising, erythema or rash.  Neurological:     General: No focal deficit present.     Mental Status: She is alert and oriented to person, place, and time.     Cranial Nerves: No cranial nerve deficit.     Sensory: No sensory deficit.  Psychiatric:        Mood and Affect: Mood normal.        Behavior: Behavior normal.        Thought Content: Thought content normal.        Judgment:  Judgment normal.      UC Treatments / Results  Labs (all labs ordered are listed, but only abnormal results are displayed) Labs Reviewed  NOVEL CORONAVIRUS, NAA (HOSP ORDER, SEND-OUT TO REF LAB; TAT 18-24 HRS)    EKG   Radiology No results found.  Procedures Procedures (including critical care time)  Medications Ordered in UC Medications - No data to display  Initial Impression / Assessment and Plan / UC Course  I have reviewed the triage vital signs and the nursing notes.  Pertinent labs & imaging results that were available during my care of the patient were reviewed by me and considered in my medical decision making (see chart for details).     #nausea #Blood pressure issue - unclear if this is directly related to vaccine or other cause given timing. No obvious signs of allergic reaction. Orthostatics good in clinic and blood pressure stable. Instructed to hold BP meds and discuss restart with primary care. Zofran for nausea. Encouraged PO intake. Strict ED and follow up precautions discussed.   Final Clinical Impressions(s) / UC Diagnoses   Final diagnoses:  Nausea  Blood pressure decreased     Discharge Instructions     Take tylenol 325mg  tablet x 2 up to every 6 hours for sore throat, fever and body aches. Do not exceed 8 tablets in 24 hours  Hold your blood pressure medication for now, consult with your primary care about restarting this.  Take the zofran to aid with nausea  Drink plenty of fluids and eat well.   If not improving in 2-3 days seek reevaulation. If symptoms get much worse, consider going to the emergency department. If you have difficulty breathing or skin rash show, go to the emergency department.  Follow up with your primary care this week to check in      ED Prescriptions    Medication Sig Dispense Auth. Provider   ondansetron (ZOFRAN) 4 MG tablet Take 1 tablet (4 mg total) by mouth every 8 (eight) hours as needed for up to 5  days for nausea or vomiting. 15 tablet Geovanie Winnett, Marguerita Beards, PA-C     PDMP not reviewed this encounter.   Purnell Shoemaker, PA-C 07/02/19 1010

## 2019-07-01 NOTE — ED Triage Notes (Signed)
Had second Covid vaccination 06/27/19; started with nausea and some dizziness x 3 days. Also noted her BP was "low" at home - "96/66".  Also c/o "hot flash" feeling.

## 2019-07-03 LAB — NOVEL CORONAVIRUS, NAA (HOSP ORDER, SEND-OUT TO REF LAB; TAT 18-24 HRS): SARS-CoV-2, NAA: NOT DETECTED

## 2019-07-05 ENCOUNTER — Other Ambulatory Visit: Payer: Self-pay

## 2019-07-05 ENCOUNTER — Encounter (HOSPITAL_COMMUNITY): Payer: Self-pay | Admitting: Emergency Medicine

## 2019-07-05 ENCOUNTER — Emergency Department (HOSPITAL_COMMUNITY)
Admission: EM | Admit: 2019-07-05 | Discharge: 2019-07-05 | Disposition: A | Discharge status: Home / Self Care | Payer: Federal, State, Local not specified - PPO | Attending: Emergency Medicine | Admitting: Emergency Medicine

## 2019-07-05 DIAGNOSIS — R079 Chest pain, unspecified: Secondary | ICD-10-CM

## 2019-07-05 DIAGNOSIS — Z5321 Procedure and treatment not carried out due to patient leaving prior to being seen by health care provider: Secondary | ICD-10-CM | POA: Insufficient documentation

## 2019-07-05 DIAGNOSIS — R42 Dizziness and giddiness: Secondary | ICD-10-CM

## 2019-07-05 LAB — TROPONIN I (HIGH SENSITIVITY): Troponin I (High Sensitivity): <2 ng/L (ref <18)

## 2019-07-05 LAB — CBC
HCT: 42.3 % (ref 36.0–46.0)
Hemoglobin: 13.9 g/dL (ref 12.0–15.0)
MCH: 29.5 pg (ref 26.0–34.0)
MCHC: 32.9 g/dL (ref 30.0–36.0)
MCV: 89.8 fL (ref 80.0–100.0)
Platelets: 311 10*3/uL (ref 150–400)
RBC: 4.71 MIL/uL (ref 3.87–5.11)
RDW: 12.8 % (ref 11.5–15.5)
WBC: 8.7 10*3/uL (ref 4.0–10.5)
nRBC: 0 % (ref 0.0–0.2)

## 2019-07-05 LAB — BASIC METABOLIC PANEL
Anion gap: 10 (ref 5–15)
BUN: 8 mg/dL (ref 6–20)
CO2: 22 mmol/L (ref 22–32)
Calcium: 9.4 mg/dL (ref 8.9–10.3)
Chloride: 106 mmol/L (ref 98–111)
Creatinine, Ser: 0.84 mg/dL (ref 0.44–1.00)
GFR calc Af Amer: >60 mL/min (ref >60)
GFR calc non Af Amer: >60 mL/min (ref >60)
Glucose, Bld: 113 mg/dL — ABNORMAL HIGH (ref 70–99)
Potassium: 3.3 mmol/L — ABNORMAL LOW (ref 3.5–5.1)
Sodium: 138 mmol/L (ref 135–145)

## 2019-07-05 LAB — I-STAT BETA HCG BLOOD, ED (MC, WL, AP ONLY): I-stat hCG, quantitative: <5 m[IU]/mL (ref <5)

## 2019-07-05 MED ORDER — SODIUM CHLORIDE 0.9% FLUSH: 3.0000 mL | Freq: Once | INTRAVENOUS | Status: DC

## 2019-07-05 NOTE — ED Triage Notes (Addendum)
Pt to ED POV. Pt c/o dizziness, fever, arm irritation since last Tuesday after second COVID vaccine. Pt states she has new onset of L CP tonight. Son is COVID positive but pt had negative COVID test Monday.

## 2019-07-05 NOTE — ED Notes (Signed)
Pt stated she feels better and will check her results on "My Chart"

## 2019-07-28 ENCOUNTER — Telehealth: Payer: Self-pay | Admitting: Family Medicine

## 2019-07-28 DIAGNOSIS — E8881 Metabolic syndrome: Secondary | ICD-10-CM

## 2019-07-28 DIAGNOSIS — I10 Essential (primary) hypertension: Secondary | ICD-10-CM

## 2019-07-28 DIAGNOSIS — E559 Vitamin D deficiency, unspecified: Secondary | ICD-10-CM

## 2019-07-28 NOTE — Telephone Encounter (Signed)
Requested Prescriptions  Pending Prescriptions Disp Refills  . metFORMIN (GLUCOPHAGE-XR) 750 MG 24 hr tablet [Pharmacy Med Name: METFORMIN ER 750MG 24HR TABS] 90 tablet 0    Sig: TAKE 1 TABLET(750 MG) BY MOUTH DAILY WITH BREAKFAST     Endocrinology:  Diabetes - Biguanides Failed - 07/28/2019  7:17 PM      Failed - HBA1C is between 0 and 7.9 and within 180 days    Hgb A1c MFr Bld  Date Value Ref Range Status  12/28/2018 5.7 (H) <5.7 % of total Hgb Final    Comment:    For someone without known diabetes, a hemoglobin  A1c value between 5.7% and 6.4% is consistent with prediabetes and should be confirmed with a  follow-up test. . For someone with known diabetes, a value <7% indicates that their diabetes is well controlled. A1c targets should be individualized based on duration of diabetes, age, comorbid conditions, and other considerations. . This assay result is consistent with an increased risk of diabetes. . Currently, no consensus exists regarding use of hemoglobin A1c for diagnosis of diabetes for children. .          Passed - Cr in normal range and within 360 days    Creat  Date Value Ref Range Status  09/15/2017 0.90 0.50 - 1.10 mg/dL Final   Creatinine, Ser  Date Value Ref Range Status  07/05/2019 0.84 0.44 - 1.00 mg/dL Final         Passed - eGFR in normal range and within 360 days    GFR, Est African American  Date Value Ref Range Status  09/15/2017 91 > OR = 60 mL/min/1.48m Final   GFR calc Af Amer  Date Value Ref Range Status  07/05/2019 >60 >60 mL/min Final   GFR, Est Non African American  Date Value Ref Range Status  09/15/2017 78 > OR = 60 mL/min/1.731mFinal   GFR calc non Af Amer  Date Value Ref Range Status  07/05/2019 >60 >60 mL/min Final         Passed - Valid encounter within last 6 months    Recent Outpatient Visits          5 months ago [facility]omnia, persistent   [facility]   7 months ago Well  woman exam   [facility]   8 months ago Chronic idiopathic constipation   [facility]   12 months [facility], [facility]   1 year [facility], [facility]  [facility]      Future Appointments            In 4 days [facility]         Refused Prescriptions Disp Refills  . Vitamin D, Ergocalciferol, (DRISDOL) 1.25 MG (50000 UNIT) CAPS capsule [Pharmacy Med Name: VITAMIN D2 50,000IU (ERGO) CAP RX] 12 capsule 1    Sig: TAKE 1 CAPSULE BY MOUTH WEEKLY     Endocrinology:  Vitamins - Vitamin D Supplementation Failed - 07/28/2019  7:17 PM      Failed - 50,000 IU strengths are not delegated      Failed - Phosphate in normal range and within 360 days    No results found for: PHOS       Failed -  Vitamin D in normal range and within 360 days    Vit D, 25-Hydroxy  Date Value Ref Range Status  08/01/2018 34 30 - 100 ng/mL Final    Comment:    Vitamin D Status         25-OH Vitamin D: . Deficiency:                    <20 ng/mL Insufficiency:             20 - 29 ng/mL Optimal:                 > or = 30 ng/mL . For 25-OH Vitamin D testing on patients on  D2-supplementation and patients for whom quantitation  of D2 and D3 fractions is required, the QuestAssureD(TM) 25-OH VIT D, (D2,D3), LC/MS/MS is recommended: order  code [id number] (patients >5yr). . For more information on this test, go to: http://education.questdiagnostics.com/faq/FAQ163 (This link is being provided for  informational/educational purposes only.)          Passed - Ca in normal range and within 360 days    Calcium  Date Value Ref Range Status  07/05/2019 9.4 8.9 - 10.3 mg/dL Final   Calcium, Ion  Date Value Ref Range Status  01/19/2009 1.25  1.12 - 1.32 mmol/L Final         Passed - Valid encounter within last 12 months    Recent Outpatient Visits          5 months ago Insomnia, persistent   [facility]   7 months ago Well woman exam   [facility]   8 months ago Chronic idiopathic constipation   [facility]   12 months ago [facility] depression, recurrent, chronic ([facility]   [name]   1 year ago Major depression, recurrent, chronic ([facility]   [facility]      Future Appointments            In 4 days SSteele Sizer MD [facility]           . hydrochlorothiazide (HYDRODIURIL) 12.5 MG tablet [Pharmacy Med Name: HYDROCHLOROTHIAZIDE 12.5MG TABLETS] 90 tablet 1    Sig: TAKE 1 TABLET(12.5 MG) BY MOUTH DAILY     Cardiovascular: Diuretics - Thiazide Failed - 07/28/2019  7:17 PM      Failed - K in normal range and within 360 days    Potassium  Date Value Ref Range Status  07/05/2019 3.3 (L) 3.5 - 5.1 mmol/L Final         Failed - Last BP in normal range    BP Readings from Last 1 Encounters:  07/05/19 (!) 128/99         Passed - Ca in normal range and within 360 days    Calcium  Date Value Ref Range Status  07/05/2019 9.4 8.9 - 10.3 mg/dL Final   Calcium, Ion  Date Value Ref Range Status  01/19/2009 1.25 1.12 - 1.32 mmol/L Final         Passed - Cr in normal range and within 360 days    Creat  Date Value Ref Range Status  09/15/2017 0.90 0.50 - 1.10 mg/dL Final   Creatinine, Ser  Date Value Ref Range Status  07/05/2019 0.84 0.44 - 1.00 mg/dL Final  Passed - Na in normal range and within 360 days    Sodium  Date Value Ref Range Status  07/05/2019 138 135 - 145 mmol/L Final         Passed - Valid encounter within last 6 months    Recent Outpatient Visits          5  months ago Insomnia, persistent   [facility] Steele Sizer, MD   7 months ago Well woman exam   [facility] Steele Sizer, MD   8 months ago Chronic idiopathic constipation   [facility] Steele Sizer, MD   12 months ago Major depression, recurrent, chronic [facility])   [place], NP   1 year ago Major depression, recurrent, chronic [facility])   [facility] Steele Sizer, MD      Future Appointments            In 4 days Steele Sizer, MD [facility], [facility]

## 2019-08-01 ENCOUNTER — Other Ambulatory Visit: Payer: Self-pay

## 2019-08-01 ENCOUNTER — Encounter: Payer: Self-pay | Admitting: Family Medicine

## 2019-08-01 ENCOUNTER — Ambulatory Visit: Payer: Federal, State, Local not specified - PPO | Admitting: Family Medicine

## 2019-08-01 VITALS — BP 118/70 | HR 87 | Temp 97.1°F | Resp 16 | Ht 66.0 in | Wt 165.5 lb

## 2019-08-01 DIAGNOSIS — E559 Vitamin D deficiency, unspecified: Secondary | ICD-10-CM

## 2019-08-01 DIAGNOSIS — G4700 Insomnia, unspecified: Secondary | ICD-10-CM

## 2019-08-01 DIAGNOSIS — F339 Major depressive disorder, recurrent, unspecified: Secondary | ICD-10-CM

## 2019-08-01 DIAGNOSIS — E538 Deficiency of other specified B group vitamins: Secondary | ICD-10-CM

## 2019-08-01 DIAGNOSIS — R102 Pelvic and perineal pain: Secondary | ICD-10-CM

## 2019-08-01 DIAGNOSIS — E8881 Metabolic syndrome: Secondary | ICD-10-CM

## 2019-08-01 DIAGNOSIS — E041 Nontoxic single thyroid nodule: Secondary | ICD-10-CM

## 2019-08-01 DIAGNOSIS — R7303 Prediabetes: Secondary | ICD-10-CM

## 2019-08-01 DIAGNOSIS — I10 Essential (primary) hypertension: Secondary | ICD-10-CM

## 2019-08-01 DIAGNOSIS — F411 Generalized anxiety disorder: Secondary | ICD-10-CM

## 2019-08-01 DIAGNOSIS — M5412 Radiculopathy, cervical region: Secondary | ICD-10-CM

## 2019-08-01 DIAGNOSIS — G8929 Other chronic pain: Secondary | ICD-10-CM

## 2019-08-01 MED ORDER — VITAMIN D 50 MCG (2000 UT) PO CAPS: 1.0000 | ORAL_CAPSULE | Freq: Every day | ORAL | 0 refills | Status: DC

## 2019-08-01 MED ORDER — TRAZODONE HCL 50 MG PO TABS: 50.0000 mg | ORAL_TABLET | Freq: Every evening | ORAL | 0 refills | Status: DC | PRN

## 2019-08-01 MED ORDER — HYDROCHLOROTHIAZIDE 12.5 MG PO TABS: 12.5000 mg | ORAL_TABLET | Freq: Every day | ORAL | 1 refills | Status: DC

## 2019-08-01 MED ORDER — OZEMPIC (1 MG/DOSE) 2 MG/1.5ML ~~LOC~~ SOPN: 1.0000 mg | PEN_INJECTOR | SUBCUTANEOUS | 1 refills | Status: DC

## 2019-08-01 MED ORDER — BUPROPION HCL ER (XL) 150 MG PO TB24: 150.0000 mg | ORAL_TABLET | Freq: Every day | ORAL | 1 refills | Status: DC
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
## 2019-08-16 ENCOUNTER — Telehealth: Payer: Self-pay | Admitting: *Deleted

## 2019-08-16 NOTE — Telephone Encounter (Signed)
Attempted to call for pre appointment review of allergies/meds. Message left. 

## 2019-08-18 ENCOUNTER — Encounter: Payer: Self-pay | Admitting: Student in an Organized Health Care Education/Training Program

## 2019-08-21 ENCOUNTER — Encounter: Payer: Self-pay | Admitting: Student in an Organized Health Care Education/Training Program

## 2019-08-21 ENCOUNTER — Other Ambulatory Visit: Payer: Self-pay

## 2019-08-21 ENCOUNTER — Ambulatory Visit
Payer: Federal, State, Local not specified - PPO | Attending: Student in an Organized Health Care Education/Training Program | Admitting: Student in an Organized Health Care Education/Training Program

## 2019-08-21 DIAGNOSIS — R102 Pelvic and perineal pain unspecified side: Secondary | ICD-10-CM

## 2019-08-21 DIAGNOSIS — Z79899 Other long term (current) drug therapy: Secondary | ICD-10-CM

## 2019-08-21 DIAGNOSIS — G894 Chronic pain syndrome: Secondary | ICD-10-CM

## 2019-08-21 DIAGNOSIS — F320 Major depressive disorder, single episode, mild: Secondary | ICD-10-CM | POA: Diagnosis not present

## 2019-08-21 DIAGNOSIS — N809 Endometriosis, unspecified: Secondary | ICD-10-CM

## 2019-08-21 DIAGNOSIS — G8929 Other chronic pain: Secondary | ICD-10-CM

## 2019-08-21 MED ORDER — TAPENTADOL HCL 75 MG PO TABS: 75.0000 mg | ORAL_TABLET | Freq: Every day | ORAL | 0 refills | Status: AC

## 2019-08-21 MED ORDER — TAPENTADOL HCL 75 MG PO TABS: 75.0000 mg | ORAL_TABLET | Freq: Every day | ORAL | 0 refills | Status: DC

## 2019-08-21 NOTE — Progress Notes (Signed)
Patient: Stacey Stanley  Service Category: E/M  Provider: [name], MD  DOB: [birth year]  DOS: 08/21/2019  Location: Office  MRN: [id number]  Setting: Ambulatory outpatient  Referring Provider: Steele Sizer, MD  Type: Established Patient  Specialty: Interventional Pain Management  PCP: Steele Sizer, MD  Location: Home  Delivery: TeleHealth     Virtual Encounter - Pain Management PROVIDER NOTE: Information contained herein reflects review and annotations entered in association with encounter. Interpretation of such information and data should be left to medically-trained personnel. Information provided to patient can be located elsewhere in the medical record under "Patient Instructions". Document created using STT-dictation technology, any transcriptional errors that may result from process are unintentional.    Contact & Pharmacy Preferred: [phone] Home: [phone] (home) Mobile: [phone] (mobile) E-mail: [email or web address]  [facility], [place] [address] [place] [address] Phone: [phone] Fax: [phone]   Pre-screening  Stacey Stanley offered "in-person" vs "virtual" encounter. She indicated preferring virtual for this encounter.   Reason COVID-19*  Social distancing based on CDC and AMA recommendations.   I contacted Stacey Stanley on 08/21/2019 via telephone.      I clearly identified myself as [name], MD. I verified that I was speaking with the correct person using two identifiers (Name: Stacey Stanley, and date of birth: 02/04/1976).  This visit was completed via telephone due to the restrictions of the COVID-19 pandemic. All issues as above were discussed and addressed but no physical exam was performed. If it was felt that the patient should be evaluated in the office, they were directed there. The patient verbally consented to this visit. Patient was unable to  complete an audio/visual visit due to Technical difficulties and/or Lack of internet. Due to the catastrophic nature of the COVID-19 pandemic, this visit was done through audio contact only.  Location of the patient: home address (see Epic for details)  Location of the provider: office   Consent I sought verbal advanced consent from Stacey Stanley for virtual visit interactions. I informed Stacey Stanley of possible security and privacy concerns, risks, and limitations associated with providing "not-in-person" medical evaluation and management services. I also informed Stacey Stanley of the availability of "in-person" appointments. Finally, I informed her that there would be a charge for the virtual visit and that she could be  personally, fully or partially, financially responsible for it. Stacey Stanley expressed understanding and agreed to proceed.   Historic Elements   Stacey Stanley is a 44 y.o. year old, female patient evaluated today after her last contact with our practice on 08/16/2019. Stacey Stanley  has a past medical history of Anxiety, Arthritis, Chronic insomnia, Depression, Diabetes mellitus without complication ([place]), Dysrhythmia, Endometriosis, GERD (gastroesophageal reflux disease), Headache(784.0), Hypertension, Kidney stones, Metabolic syndrome, Obesity, Serum calcium elevated, Sleep disorder, circadian, shift work type, SVD (spontaneous vaginal delivery), and Tachycardia. She also  has a past surgical history that includes metatarsil  (2003); Dilation and curettage of uterus; Induced abortion; laparoscopy (1992); Eye surgery (06/28/13); laparoscopy (N/A, 07/19/2013); Hysteroscopy with D & C (N/A, 07/19/2013); Abdominal hysterectomy (05/21/2016); and Hemorroidectomy (04/04/2018). Stacey Stanley has a current medication list which includes the following prescription(s): acetaminophen, aspirin ec, bupropion, vitamin d, b-12, hydrochlorothiazide, ibuprofen, metformin, fish oil, ozempic (1  mg/dose), [START ON 08/27/2019] tapentadol hcl, [START ON 09/26/2019] tapentadol hcl, [START ON 10/26/2019] tapentadol hcl, trazodone, and trulance. She  reports  that she has been smoking cigarettes. She started smoking about 25 years ago. She has a 12.50 pack-year smoking history. She has never used smokeless tobacco. She reports that she does not drink alcohol or use drugs. Stacey Stanley is allergic to nuvigil [armodafinil].   HPI  Today, she is being contacted for medication management.   Started Wellbutrin approx 2 weeks ago for depression. Patient's pain is at baseline.  Patient continues multimodal pain regimen as prescribed.  States that it provides pain relief and improvement in functional status.  Pharmacotherapy Assessment  Analgesic: 07/28/2019  1   05/23/2019  Nucynta 75 MG Tablet  30.00  30 Bi Lat   [id number]   Wal (5343)   0  30.00 MME  Comm Ins   [place]     Monitoring: [place] PMP: PDMP reviewed during this encounter.       Pharmacotherapy: No side-effects or adverse reactions reported. Compliance: No problems identified. Effectiveness: Clinically acceptable. Plan: Refer to "POC".  UDS:  Summary  Date Value Ref Range Status  03/15/2019 Note  Final    Comment:    ==================================================================== Tox[employer]SU[employer] Select 13 (MW) ==================================================================== Test                             Result       Flag       Units Drug Present and Declared for Prescription Verification   Tapentadol                     >4673        EXPECTED   ng/mg creat    Source of tapentadol is a scheduled prescription medication. ==================================================================== Test                      Result    Flag   Units      Ref Range   Creatinine              214              mg/dL      >=20 ==================================================================== Declared Medications:  The flagging and interpretation  on this report are based on the  following declared medications.  Unexpected results may arise from  inaccuracies in the declared medications.  **Note: The testing scope of this panel includes these medications:  Tapentadol  **Note: The testing scope of this panel does not include the  following reported medications:  Acetaminophen  Aspirin  Duloxetine  Hydrochlorothiazide  Ibuprofen  Metformin  Omega-3 Fatty Acids  Semaglutide  Trazodone  Vitamin B12  Vitamin D2 ==================================================================== For clinical consultation, please call [phone]. ====================================================================    Laboratory Chemistry Profile   Renal Lab Results  Component Value Date   BUN 8 07/05/2019   CREATININE 0.84 32/20/2542   BCR NOT APPLICABLE 70/62/3762   GFRAA >60 07/05/2019   GFRNONAA >60 07/05/2019    Hepatic Lab Results  Component Value Date   AST 16 02/18/2019   ALT 11 02/18/2019   ALBUMIN 4.0 02/18/2019   ALKPHOS 37 (L) 02/18/2019   AMYLASE 102 (H) 05/22/2016   LIPASE 31 02/18/2019    Electrolytes Lab Results  Component Value Date   NA 138 07/05/2019   K 3.3 (L) 07/05/2019   CL 106 07/05/2019   CALCIUM 9.4 07/05/2019   MG 1.5 09/15/2017    Bone Lab Results  Component Value Date   VD25OH 34 08/01/2018  Inflammation (CRP: Acute Phase) (ESR: Chronic Phase) Lab Results  Component Value Date   CRP 1.0 03/08/2017   ESRSEDRATE 2 03/08/2017      Note: Above Lab results reviewed.  Imaging  US Pelvis Complete CLINICAL DATA:  Left lower quadrant pain, prior hysterectomy  EXAM: TRANSABDOMINAL ULTRASOUND OF PELVIS  TECHNIQUE: Transabdominal ultrasound examination of the pelvis was performed including evaluation of the uterus, ovaries, adnexal regions, and pelvic cul-de-sac.  COMPARISON:  CT abdomen/pelvis dated 09/09/2017  FINDINGS: Uterus  Surgically absent.  Right ovary  Not discretely  visualized.  No adnexal mass is seen.  Left ovary  Measurements: 1.9 x 1.5 x 1.6 cm. Poorly visualized but grossly unremarkable.  Other findings:  No abnormal free fluid.  IMPRESSION: Left ovary is grossly unremarkable.  Right ovary is not discretely visualized.  Status post hysterectomy.  Electronically Signed   By: Julian Hy M.D.   On: 11/12/2017 16:49  Assessment  The primary encounter diagnosis was Chronic pelvic pain in female. Diagnoses of Chronic pain syndrome, Pelvic pain, Endometriosis, Mild major depression ([place]), and Long term prescription benzodiazepine use were also pertinent to this visit.  Plan of Care  [name] has a current medication list which includes the following long-term medication(s): bupropion, hydrochlorothiazide, metformin, and trazodone.  Pharmacotherapy (Medications Ordered): Meds ordered this encounter  Medications  . tapentadol HCl (NUCYNTA) 75 MG tablet    Sig: Take 1 tablet (75 mg total) by mouth daily.    Dispense:  30 tablet    Refill:  0  . tapentadol HCl (NUCYNTA) 75 MG tablet    Sig: Take 1 tablet (75 mg total) by mouth daily.    Dispense:  30 tablet    Refill:  0  . tapentadol HCl (NUCYNTA) 75 MG tablet    Sig: Take 1 tablet (75 mg total) by mouth daily.    Dispense:  30 tablet    Refill:  0  Follow-up plan:   Return in about 3 months (around 11/21/2019) for Medication Management, virtual.    Recent Visits Date Type Provider Dept  05/23/19 Office Visit [name], MD [facility]  Showing recent visits within past 90 days and meeting all other requirements   Today's Visits Date Type Provider Dept  08/21/19 Office Visit [name], MD [facility]  Showing today's visits and meeting all other requirements   Future Appointments No visits were found meeting these conditions.  Showing future appointments within next 90 days and meeting all other requirements   I discussed the  assessment and treatment plan with the patient. The patient was provided an opportunity to ask questions and all were answered. The patient agreed with the plan and demonstrated an understanding of the instructions.  Patient advised to call back or seek an in-person evaluation if the symptoms or condition worsens.  Duration of encounter:25 minutes.  Note by: [name], MD Date: 08/21/2019; Time: 2:00 PM

## 2019-08-22 DIAGNOSIS — E041 Nontoxic single thyroid nodule: Secondary | ICD-10-CM | POA: Diagnosis not present

## 2019-08-29 DIAGNOSIS — E042 Nontoxic multinodular goiter: Secondary | ICD-10-CM | POA: Diagnosis not present

## 2019-09-21 DIAGNOSIS — A630 Anogenital (venereal) warts: Secondary | ICD-10-CM | POA: Diagnosis not present

## 2019-09-21 DIAGNOSIS — L700 Acne vulgaris: Secondary | ICD-10-CM | POA: Diagnosis not present

## 2019-09-21 DIAGNOSIS — L810 Postinflammatory hyperpigmentation: Secondary | ICD-10-CM | POA: Diagnosis not present

## 2019-10-04 DIAGNOSIS — N83202 Unspecified ovarian cyst, left side: Secondary | ICD-10-CM | POA: Diagnosis not present

## 2019-10-04 DIAGNOSIS — R1032 Left lower quadrant pain: Secondary | ICD-10-CM | POA: Diagnosis not present

## 2019-10-12 DIAGNOSIS — N83292 Other ovarian cyst, left side: Secondary | ICD-10-CM | POA: Diagnosis not present

## 2019-10-12 DIAGNOSIS — R1031 Right lower quadrant pain: Secondary | ICD-10-CM | POA: Diagnosis not present

## 2019-10-17 ENCOUNTER — Telehealth: Payer: Self-pay | Admitting: Student in an Organized Health Care Education/Training Program

## 2019-10-17 NOTE — Telephone Encounter (Signed)
Patient has question about the pain contract. She is waiting on surgery for a mass on her ovary. She wants to see if Dr. Holley Raring would increase her medications or change something as she has increased pain.

## 2019-10-17 NOTE — Telephone Encounter (Signed)
Patient advised she may receive opioids from another physician for acute condition without violation of medication agreement.

## 2019-10-18 ENCOUNTER — Telehealth: Payer: Self-pay

## 2019-10-18 NOTE — Telephone Encounter (Signed)
LM  For patient to call office regarding pre virtual visit appointment,.

## 2019-10-19 ENCOUNTER — Telehealth (HOSPITAL_BASED_OUTPATIENT_CLINIC_OR_DEPARTMENT_OTHER)
Payer: Federal, State, Local not specified - PPO | Admitting: Student in an Organized Health Care Education/Training Program

## 2019-10-19 DIAGNOSIS — G894 Chronic pain syndrome: Secondary | ICD-10-CM

## 2019-11-01 ENCOUNTER — Ambulatory Visit: Payer: Federal, State, Local not specified - PPO | Admitting: Family Medicine

## 2019-11-01 ENCOUNTER — Ambulatory Visit
Payer: Federal, State, Local not specified - PPO | Attending: Student in an Organized Health Care Education/Training Program | Admitting: Student in an Organized Health Care Education/Training Program

## 2019-11-01 ENCOUNTER — Telehealth: Payer: Self-pay | Admitting: *Deleted

## 2019-11-01 ENCOUNTER — Encounter: Payer: Self-pay | Admitting: Family Medicine

## 2019-11-01 ENCOUNTER — Other Ambulatory Visit: Payer: Self-pay

## 2019-11-01 ENCOUNTER — Encounter: Payer: Self-pay | Admitting: Student in an Organized Health Care Education/Training Program

## 2019-11-01 VITALS — BP 110/72 | HR 97 | Temp 97.9°F | Resp 16 | Ht 66.0 in | Wt 169.0 lb

## 2019-11-01 DIAGNOSIS — R109 Unspecified abdominal pain: Secondary | ICD-10-CM

## 2019-11-01 DIAGNOSIS — G4700 Insomnia, unspecified: Secondary | ICD-10-CM

## 2019-11-01 DIAGNOSIS — M5412 Radiculopathy, cervical region: Secondary | ICD-10-CM

## 2019-11-01 DIAGNOSIS — E8881 Metabolic syndrome: Secondary | ICD-10-CM | POA: Diagnosis not present

## 2019-11-01 DIAGNOSIS — G894 Chronic pain syndrome: Secondary | ICD-10-CM

## 2019-11-01 DIAGNOSIS — E559 Vitamin D deficiency, unspecified: Secondary | ICD-10-CM

## 2019-11-01 DIAGNOSIS — G8929 Other chronic pain: Secondary | ICD-10-CM

## 2019-11-01 DIAGNOSIS — R110 Nausea: Secondary | ICD-10-CM

## 2019-11-01 DIAGNOSIS — E538 Deficiency of other specified B group vitamins: Secondary | ICD-10-CM

## 2019-11-01 DIAGNOSIS — R1012 Left upper quadrant pain: Secondary | ICD-10-CM

## 2019-11-01 DIAGNOSIS — N809 Endometriosis, unspecified: Secondary | ICD-10-CM | POA: Diagnosis not present

## 2019-11-01 DIAGNOSIS — F411 Generalized anxiety disorder: Secondary | ICD-10-CM

## 2019-11-01 DIAGNOSIS — I10 Essential (primary) hypertension: Secondary | ICD-10-CM | POA: Diagnosis not present

## 2019-11-01 DIAGNOSIS — R102 Pelvic and perineal pain: Secondary | ICD-10-CM

## 2019-11-01 MED ORDER — TAPENTADOL HCL 75 MG PO TABS: 75.0000 mg | ORAL_TABLET | Freq: Every day | ORAL | 0 refills | Status: AC

## 2019-11-01 MED ORDER — PROMETHAZINE HCL 25 MG PO TABS: 12.5000 mg | ORAL_TABLET | Freq: Four times a day (QID) | ORAL | 0 refills | Status: DC | PRN

## 2019-11-01 MED ORDER — METFORMIN HCL ER 750 MG PO TB24: 750.0000 mg | ORAL_TABLET | Freq: Every day | ORAL | 1 refills | Status: DC

## 2019-11-01 MED ORDER — TAPENTADOL HCL 75 MG PO TABS: 75.0000 mg | ORAL_TABLET | Freq: Every day | ORAL | 0 refills | Status: DC

## 2019-11-01 NOTE — Progress Notes (Addendum)
Patient: Stacey Stanley  Service Category: E/M  Provider: [name], MD  DOB: [birth year]  DOS: 11/01/2019  Location: Office  MRN: [id number]  Setting: Ambulatory outpatient  Referring Provider: Steele Sizer, MD  Type: Established Patient  Specialty: Interventional Pain Management  PCP: Steele Sizer, MD  Location: Home  Delivery: TeleHealth     Virtual Encounter - Pain Management PROVIDER NOTE: Information contained herein reflects review and annotations entered in association with encounter. Interpretation of such information and data should be left to medically-trained personnel. Information provided to patient can be located elsewhere in the medical record under "Patient Instructions". Document created using STT-dictation technology, any transcriptional errors that may result from process are unintentional.    Contact & Pharmacy Preferred: [phone] Home: [phone] (home) Mobile: [phone] (mobile) E-mail: [email or web address]  [place], [place] - [address] [place] [address] Phone: [phone] Fax: [phone]   Pre-screening  Stacey Stanley offered "in-person" vs "virtual" encounter. She indicated preferring virtual for this encounter.   Reason COVID-19*  Social distancing based on CDC and AMA recommendations.   I contacted Stacey Stanley on 11/01/2019 via video conference.      I clearly identified myself as [name], MD. I verified that I was speaking with the correct person using two identifiers (Name: Stacey Stanley, and date of birth: 01-27-1976).  Consent I sought verbal advanced consent from Stacey Stanley for virtual visit interactions. I informed Stacey Stanley of possible security and privacy concerns, risks, and limitations associated with providing "not-in-person" medical evaluation and management services. I also informed Stacey Stanley of the availability of  "in-person" appointments. Finally, I informed her that there would be a charge for the virtual visit and that she could be  personally, fully or partially, financially responsible for it. Stacey Stanley expressed understanding and agreed to proceed.   Historic Elements   Stacey Stanley is a 44 y.o. year old, female patient evaluated today after her last contact with our practice on 10/18/2019. Stacey Stanley  has a past medical history of Anxiety, Arthritis, Chronic insomnia, Depression, Diabetes mellitus without complication ([place]), Dysrhythmia, Endometriosis, GERD (gastroesophageal reflux disease), Headache(784.0), Hypertension, Kidney stones, Metabolic syndrome, Obesity, Serum calcium elevated, Sleep disorder, circadian, shift work type, SVD (spontaneous vaginal delivery), and Tachycardia. She also  has a past surgical history that includes metatarsil  (2003); Dilation and curettage of uterus; Induced abortion; laparoscopy (1992); Eye surgery (06/28/13); laparoscopy (N/A, 07/19/2013); Hysteroscopy with D & C (N/A, 07/19/2013); Abdominal hysterectomy (05/21/2016); and Hemorroidectomy (04/04/2018). Stacey Stanley has a current medication list which includes the following prescription(s): acetaminophen, aspirin ec, bupropion, vitamin d, b-12, hydrochlorothiazide, ibuprofen, fish oil, ozempic (1 mg/dose), [START ON 11/22/2019] tapentadol hcl, [START ON 12/22/2019] tapentadol hcl, [START ON 01/21/2020] tapentadol hcl, trazodone, trulance, dapsone, imiquimod, metformin, promethazine, and tri-luma. She  reports that she has been smoking cigarettes. She started smoking about 25 years ago. She has a 12.50 pack-year smoking history. She has never used smokeless tobacco. She reports that she does not drink alcohol or use drugs. Stacey Stanley is allergic to nuvigil [armodafinil].   HPI  Today, she is being contacted for medication management.   Patient states that her OB, Dr. Garwin Brothers discovered a pelvic mass via ultrasound.  She  is being referred to [facility] [place] for further evaluation and management.  Surgery to remove pelvic mass may be an option in the future but awaiting plan  at the moment.  Otherwise the patient's pain is fairly well managed on her current regimen of Nucynta 75 mg daily as needed.  We will refill below for 3 months.  Pharmacotherapy Assessment  Analgesic: 10/23/2019  1   08/21/2019  Nucynta 75 MG Tablet  30.00  30 Bi Lat   [id number]   Wal (5343)   0  30.00 MME  Comm Ins   [place]     Monitoring: [place] PMP: PDMP reviewed during this encounter.       Pharmacotherapy: No side-effects or adverse reactions reported. Compliance: No problems identified. Effectiveness: Clinically acceptable. Plan: Refer to "POC".  UDS:  Summary  Date Value Ref Range Status  03/15/2019 Note  Final    Comment:    ==================================================================== ToxASSURE Select 13 (MW) ==================================================================== Test                             Result       Flag       Units Drug Present and Declared for Prescription Verification   Tapentadol                     >4673        EXPECTED   ng/mg creat    Source of tapentadol is a scheduled prescription medication. ==================================================================== Test                      Result    Flag   Units      Ref Range   Creatinine              214              mg/dL      >=20 ==================================================================== Declared Medications:  The flagging and interpretation on this report are based on the  following declared medications.  Unexpected results may arise from  inaccuracies in the declared medications.  **Note: The testing scope of this panel includes these medications:  Tapentadol  **Note: The testing scope of this panel does not include the  following reported medications:  Acetaminophen  Aspirin  Duloxetine  Hydrochlorothiazide  Ibuprofen  Metformin   Omega-3 Fatty Acids  Semaglutide  Trazodone  Vitamin B12  Vitamin D2 ==================================================================== For clinical consultation, please call [phone]. ====================================================================     Laboratory Chemistry Profile   Renal Lab Results  Component Value Date   BUN 8 07/05/2019   CREATININE 0.84 96/78/9381   BCR NOT APPLICABLE 01/75/1025   GFRAA >60 07/05/2019   GFRNONAA >60 07/05/2019     Hepatic Lab Results  Component Value Date   AST 16 02/18/2019   ALT 11 02/18/2019   ALBUMIN 4.0 02/18/2019   ALKPHOS 37 (L) 02/18/2019   AMYLASE 102 (H) 05/22/2016   LIPASE 31 02/18/2019     Electrolytes Lab Results  Component Value Date   NA 138 07/05/2019   K 3.3 (L) 07/05/2019   CL 106 07/05/2019   CALCIUM 9.4 07/05/2019   MG 1.5 09/15/2017     Bone Lab Results  Component Value Date   VD25OH 34 08/01/2018     Inflammation (CRP: Acute Phase) (ESR: Chronic Phase) Lab Results  Component Value Date   CRP 1.0 03/08/2017   ESRSEDRATE 2 03/08/2017       Note: Above Lab results reviewed.   Imaging  US Pelvis Complete CLINICAL DATA:  Left lower quadrant pain, prior hysterectomy  EXAM: TRANSABDOMINAL  ULTRASOUND OF PELVIS  TECHNIQUE: Transabdominal ultrasound examination of the pelvis was performed including evaluation of the uterus, ovaries, adnexal regions, and pelvic cul-de-sac.  COMPARISON:  CT abdomen/pelvis dated 09/09/2017  FINDINGS: Uterus  Surgically absent.  Right ovary  Not discretely visualized.  No adnexal mass is seen.  Left ovary  Measurements: 1.9 x 1.5 x 1.6 cm. Poorly visualized but grossly unremarkable.  Other findings:  No abnormal free fluid.  IMPRESSION: Left ovary is grossly unremarkable.  Right ovary is not discretely visualized.  Status post hysterectomy.  Electronically Signed   By: Julian Hy M.D.   On: 11/12/2017 16:49  Assessment   The primary encounter diagnosis was Chronic pelvic pain in female. Diagnoses of Chronic pain syndrome, Pelvic pain, and Endometriosis were also pertinent to this visit.  Plan of Care  Stacey Stanley has a current medication list which includes the following long-term medication(s): bupropion, hydrochlorothiazide, trazodone, metformin, and promethazine.  Pharmacotherapy (Medications Ordered): Meds ordered this encounter  Medications  . tapentadol HCl (NUCYNTA) 75 MG tablet    Sig: Take 1 tablet (75 mg total) by mouth daily.    Dispense:  30 tablet    Refill:  0  . tapentadol HCl (NUCYNTA) 75 MG tablet    Sig: Take 1 tablet (75 mg total) by mouth daily.    Dispense:  30 tablet    Refill:  0  . tapentadol HCl (NUCYNTA) 75 MG tablet    Sig: Take 1 tablet (75 mg total) by mouth daily.    Dispense:  30 tablet    Refill:  0   Follow-up plan:   Return in about 3 months (around 02/15/2020) for Medication Management, in person, (UDS).    Recent Visits Date Type Provider Dept  08/21/19 Office Visit [name], MD [facility]  Showing recent visits within past 90 days and meeting all other requirements   Today's Visits Date Type Provider Dept  11/01/19 Telemedicine [name], MD [facility]  Showing today's visits and meeting all other requirements   Future Appointments No visits were found meeting these conditions.  Showing future appointments within next 90 days and meeting all other requirements   I discussed the assessment and treatment plan with the patient. The patient was provided an opportunity to ask questions and all were answered. The patient agreed with the plan and demonstrated an understanding of the instructions.  Patient advised to call back or seek an in-person evaluation if the symptoms or condition worsens.  Duration of encounter: 26mnutes.  Note by: BGillis Santa MD Date: 11/01/2019; Time: 2:50 PM
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
## 2019-11-10 ENCOUNTER — Ambulatory Visit (HOSPITAL_COMMUNITY): Payer: Federal, State, Local not specified - PPO | Attending: Family Medicine

## 2019-11-14 ENCOUNTER — Encounter
Payer: Federal, State, Local not specified - PPO | Admitting: Student in an Organized Health Care Education/Training Program

## 2019-11-16 ENCOUNTER — Encounter
Payer: Federal, State, Local not specified - PPO | Admitting: Student in an Organized Health Care Education/Training Program

## 2019-11-24 ENCOUNTER — Ambulatory Visit (HOSPITAL_COMMUNITY)
Admission: RE | Admit: 2019-11-24 | Discharge: 2019-11-24 | Disposition: A | Discharge status: Home / Self Care | Payer: Federal, State, Local not specified - PPO | Source: Ambulatory Visit | Attending: Family Medicine | Admitting: Family Medicine

## 2019-11-24 ENCOUNTER — Other Ambulatory Visit: Payer: Self-pay

## 2019-11-24 DIAGNOSIS — N200 Calculus of kidney: Secondary | ICD-10-CM | POA: Diagnosis not present

## 2019-11-24 DIAGNOSIS — R1012 Left upper quadrant pain: Secondary | ICD-10-CM

## 2019-11-29 DIAGNOSIS — Z1231 Encounter for screening mammogram for malignant neoplasm of breast: Secondary | ICD-10-CM | POA: Diagnosis not present

## 2019-11-29 DIAGNOSIS — Z6828 Body mass index (BMI) 28.0-28.9, adult: Secondary | ICD-10-CM | POA: Diagnosis not present

## 2019-11-29 DIAGNOSIS — Z01419 Encounter for gynecological examination (general) (routine) without abnormal findings: Secondary | ICD-10-CM | POA: Diagnosis not present

## 2019-12-07 ENCOUNTER — Other Ambulatory Visit: Payer: Self-pay | Admitting: Family Medicine

## 2019-12-07 DIAGNOSIS — N9489 Other specified conditions associated with female genital organs and menstrual cycle: Secondary | ICD-10-CM | POA: Diagnosis not present

## 2019-12-07 DIAGNOSIS — Z6829 Body mass index (BMI) 29.0-29.9, adult: Secondary | ICD-10-CM | POA: Diagnosis not present

## 2019-12-07 DIAGNOSIS — G4700 Insomnia, unspecified: Secondary | ICD-10-CM

## 2019-12-07 DIAGNOSIS — R102 Pelvic and perineal pain: Secondary | ICD-10-CM | POA: Diagnosis not present

## 2019-12-07 DIAGNOSIS — N83209 Unspecified ovarian cyst, unspecified side: Secondary | ICD-10-CM | POA: Diagnosis not present

## 2019-12-07 MED ORDER — TRAZODONE HCL 50 MG PO TABS: 50.0000 mg | ORAL_TABLET | Freq: Every evening | ORAL | 0 refills | Status: DC | PRN

## 2019-12-07 NOTE — Telephone Encounter (Signed)
Medication Refill - Medication: trazadone   Has the patient contacted their pharmacy? Yes.   (Agent: If no, request that the patient contact the pharmacy for the refill.) (Agent: If yes, when and what did the pharmacy advise?)  Preferred Pharmacy (with phone number or street name):  [facility] [place], [place] [address]  [place] [address]  Phone: [phone] Fax: [phone]  Hours: Not open 24 hours     Agent: Please be advised that RX refills may take up to 3 business days. We ask that you follow-up with your pharmacy.

## 2019-12-19 ENCOUNTER — Telehealth: Payer: Self-pay | Admitting: *Deleted

## 2019-12-19 NOTE — Telephone Encounter (Signed)
Patient notified that FMLA form is completed.

## 2019-12-25 DIAGNOSIS — M62838 Other muscle spasm: Secondary | ICD-10-CM | POA: Diagnosis not present

## 2019-12-25 DIAGNOSIS — R102 Pelvic and perineal pain: Secondary | ICD-10-CM | POA: Diagnosis not present

## 2019-12-25 DIAGNOSIS — M6289 Other specified disorders of muscle: Secondary | ICD-10-CM | POA: Diagnosis not present

## 2019-12-25 DIAGNOSIS — N3941 Urge incontinence: Secondary | ICD-10-CM | POA: Diagnosis not present

## 2019-12-28 DIAGNOSIS — M6289 Other specified disorders of muscle: Secondary | ICD-10-CM | POA: Diagnosis not present

## 2019-12-28 DIAGNOSIS — M62838 Other muscle spasm: Secondary | ICD-10-CM | POA: Diagnosis not present

## 2019-12-28 DIAGNOSIS — N3941 Urge incontinence: Secondary | ICD-10-CM | POA: Diagnosis not present

## 2019-12-28 DIAGNOSIS — N941 Unspecified dyspareunia: Secondary | ICD-10-CM | POA: Diagnosis not present

## 2020-01-03 DIAGNOSIS — M6289 Other specified disorders of muscle: Secondary | ICD-10-CM | POA: Diagnosis not present

## 2020-01-03 DIAGNOSIS — N3941 Urge incontinence: Secondary | ICD-10-CM | POA: Diagnosis not present

## 2020-01-03 DIAGNOSIS — M62838 Other muscle spasm: Secondary | ICD-10-CM | POA: Diagnosis not present

## 2020-01-03 DIAGNOSIS — N941 Unspecified dyspareunia: Secondary | ICD-10-CM | POA: Diagnosis not present

## 2020-01-09 DIAGNOSIS — M62838 Other muscle spasm: Secondary | ICD-10-CM | POA: Diagnosis not present

## 2020-01-09 DIAGNOSIS — N3941 Urge incontinence: Secondary | ICD-10-CM | POA: Diagnosis not present

## 2020-01-09 DIAGNOSIS — M6289 Other specified disorders of muscle: Secondary | ICD-10-CM | POA: Diagnosis not present

## 2020-01-09 DIAGNOSIS — K5900 Constipation, unspecified: Secondary | ICD-10-CM | POA: Diagnosis not present

## 2020-01-18 DIAGNOSIS — N3941 Urge incontinence: Secondary | ICD-10-CM | POA: Diagnosis not present

## 2020-01-18 DIAGNOSIS — M6289 Other specified disorders of muscle: Secondary | ICD-10-CM | POA: Diagnosis not present

## 2020-01-18 DIAGNOSIS — N941 Unspecified dyspareunia: Secondary | ICD-10-CM | POA: Diagnosis not present

## 2020-01-18 DIAGNOSIS — M62838 Other muscle spasm: Secondary | ICD-10-CM | POA: Diagnosis not present

## 2020-01-25 ENCOUNTER — Other Ambulatory Visit: Payer: Self-pay

## 2020-01-25 ENCOUNTER — Ambulatory Visit
Payer: Federal, State, Local not specified - PPO | Attending: Student in an Organized Health Care Education/Training Program | Admitting: Student in an Organized Health Care Education/Training Program

## 2020-01-25 ENCOUNTER — Encounter: Payer: Self-pay | Admitting: Student in an Organized Health Care Education/Training Program

## 2020-01-25 VITALS — Ht 66.0 in | Wt 180.0 lb

## 2020-01-25 DIAGNOSIS — G894 Chronic pain syndrome: Secondary | ICD-10-CM | POA: Diagnosis not present

## 2020-01-25 DIAGNOSIS — G4700 Insomnia, unspecified: Secondary | ICD-10-CM

## 2020-01-25 DIAGNOSIS — R102 Pelvic and perineal pain: Secondary | ICD-10-CM | POA: Insufficient documentation

## 2020-01-25 DIAGNOSIS — E8881 Metabolic syndrome: Secondary | ICD-10-CM | POA: Diagnosis not present

## 2020-01-25 DIAGNOSIS — G8929 Other chronic pain: Secondary | ICD-10-CM | POA: Insufficient documentation

## 2020-01-25 DIAGNOSIS — M5412 Radiculopathy, cervical region: Secondary | ICD-10-CM | POA: Diagnosis not present

## 2020-01-25 DIAGNOSIS — N809 Endometriosis, unspecified: Secondary | ICD-10-CM | POA: Diagnosis not present

## 2020-01-25 MED ORDER — TAPENTADOL HCL 75 MG PO TABS: 75.0000 mg | ORAL_TABLET | Freq: Two times a day (BID) | ORAL | 0 refills | Status: AC | PRN

## 2020-01-25 MED ORDER — TAPENTADOL HCL 75 MG PO TABS: 75.0000 mg | ORAL_TABLET | Freq: Two times a day (BID) | ORAL | 0 refills | Status: DC | PRN

## 2020-01-25 NOTE — Progress Notes (Signed)
Nursing Pain Medication Assessment:  Safety precautions to be maintained throughout the outpatient stay will include: orient to surroundings, keep bed in low position, maintain call bell within reach at all times, provide assistance with transfer out of bed and ambulation.  Medication Inspection Compliance: Pill count conducted under aseptic conditions, in front of the patient. Neither the pills nor the bottle was removed from the patient's sight at any time. Once count was completed pills were immediately returned to the patient in their original bottle.  Medication: Tapentadol (Nucynta) Pill/Patch Count: 0 of 30 pills remain Pill/Patch Appearance: No markings Bottle Appearance: Standard pharmacy container. Clearly labeled. Filled Date: 07 / 23 / 2021 Last Medication intake:  Ran out of medicine more than 48 hours ago

## 2020-01-25 NOTE — Progress Notes (Signed)
PROVIDER NOTE: Information contained herein reflects review and annotations entered in association with encounter. Interpretation of such information and data should be left to medically-trained personnel. Information provided to patient can be located elsewhere in the medical record under "Patient Instructions". Document created using STT-dictation technology, any transcriptional errors that may result from process are unintentional.    Patient: Stacey Stanley  Service Category: E/M  Provider: [name], MD  DOB: 1975-09-16  DOS: 01/25/2020  Specialty: Interventional Pain Management  MRN: [id number]  Setting: Ambulatory outpatient  PCP: Steele Sizer, MD  Type: Established Patient    Referring Provider: Steele Sizer, MD  Location: Office  Delivery: Face-to-face     HPI  Reason for encounter: [name], a 44 y.o. year old female, is here today for evaluation and management of her Pelvic pain [R10.2]. Ms. Gong primary complain today is Pain (pelvic) Last encounter: Practice (12/19/2019). My last encounter with her was on 10/17/2019. Pertinent problems: [name] has Endometriosis; Chronic pelvic pain in female; Cervical radiculitis; Pelvic pain; Chronic pain syndrome; and Opiate use on their pertinent problem list. Pain Assessment: Severity of Chronic pain is reported as a 7 /10. Location: Pelvis  / . Onset: More than a month ago. Quality: Constant. Timing: Constant. Modifying factor(s): medications, PT. Vitals:  height is '5\' 6"'  (1.676 m) and weight is 180 lb (81.6 kg).   Patient presents today for medication management for her pelvic pain.  She is started pelvic physical therapy for pelvic floor strengthening which she states is helping.  She wishes she could do this more frequently however is limited by work.  She does try to do these exercises at home.  Patient was unaware that she still has a prescription at her pharmacy that she can pick up.  She is having increased  breakthrough pain on certain days.  We discussed increasing her monthly quantity from 30-->45 at today's prescription  Pharmacotherapy Assessment   12/22/2019  1   11/01/2019  Nucynta 75 MG Tablet  30.00  30 Bi Lat   [id number]   [facility] (5343)   0/0  30.00 MME  Comm Ins   [facility]      Monitoring: [place] PMP: PDMP reviewed during this encounter.       Pharmacotherapy: No side-effects or adverse reactions reported. Compliance: No problems identified. Effectiveness: Clinically acceptable.  Hart Rochester, RN  01/25/2020 10:32 AM  Sign when Signing Visit Nursing Pain Medication Assessment:  Safety precautions to be maintained throughout the outpatient stay will include: orient to surroundings, keep bed in low position, maintain call bell within reach at all times, provide assistance with transfer out of bed and ambulation.  Medication Inspection Compliance: Pill count conducted under aseptic conditions, in front of the patient. Neither the pills nor the bottle was removed from the patient's sight at any time. Once count was completed pills were immediately returned to the patient in their original bottle.  Medication: Tapentadol (Nucynta) Pill/Patch Count: 0 of 30 pills remain Pill/Patch Appearance: No markings Bottle Appearance: Standard pharmacy container. Clearly labeled. Filled Date: 07 / 23 / 2021 Last Medication intake:  Ran out of medicine more than 48 hours ago    UDS:  Summary  Date Value Ref Range Status  03/15/2019 Note  Final    Comment:    ==================================================================== ToxASSURE Select 13 (MW) ==================================================================== Test  Result       Flag       Units Drug Present and Declared for Prescription Verification   Tapentadol                     >4673        EXPECTED   ng/mg creat    Source of tapentadol is a scheduled prescription  medication. ==================================================================== Test                      Result    Flag   Units      Ref Range   Creatinine              214              mg/dL      >=20 ==================================================================== Declared Medications:  The flagging and interpretation on this report are based on the  following declared medications.  Unexpected results may arise from  inaccuracies in the declared medications.  **Note: The testing scope of this panel includes these medications:  Tapentadol  **Note: The testing scope of this panel does not include the  following reported medications:  Acetaminophen  Aspirin  Duloxetine  Hydrochlorothiazide  Ibuprofen  Metformin  Omega-3 Fatty Acids  Semaglutide  Trazodone  Vitamin B12  Vitamin D2 ==================================================================== For clinical consultation, please call [phone]. ====================================================================      ROS  Constitutional: Denies any fever or chills Gastrointestinal: No reported hemesis, hematochezia, vomiting, or acute GI distress Musculoskeletal: Low back pain, pelvic pain Neurological: No reported episodes of acute onset apraxia, aphasia, dysarthria, agnosia, amnesia, paralysis, loss of coordination, or loss of consciousness  Medication Review  Dapsone, Fish Oil, Fluocin-Hydroquinone-Tretinoin, Plecanatide, Semaglutide (1 MG/DOSE), Vitamin D, acetaminophen, aspirin EC, hydrochlorothiazide, ibuprofen, imiquimod, metFORMIN, promethazine, tapentadol HCl, and traZODone  History Review  Allergy: Ms. Oelke is allergic to nuvigil [armodafinil]. Drug: Ms. Fern  reports no history of drug use. Alcohol:  reports no history of alcohol use. Tobacco:  reports that she has been smoking cigarettes. She started smoking about 26 years ago. She has a 12.50 pack-year smoking history. She has never used  smokeless tobacco. Social: Ms. Hosie  reports that she has been smoking cigarettes. She started smoking about 26 years ago. She has a 12.50 pack-year smoking history. She has never used smokeless tobacco. She reports that she does not drink alcohol and does not use drugs. Medical:  has a past medical history of Anxiety, Arthritis, Chronic insomnia, Depression, Diabetes mellitus without complication ([place]), Dysrhythmia, Endometriosis, GERD (gastroesophageal reflux disease), Headache(784.0), Hypertension, Kidney stones, Metabolic syndrome, Obesity, Serum calcium elevated, Sleep disorder, circadian, shift work type, SVD (spontaneous vaginal delivery), and Tachycardia. Surgical: Ms. Eyster  has a past surgical history that includes metatarsil  (2003); Dilation and curettage of uterus; Induced abortion; laparoscopy (1992); Eye surgery (06/28/13); laparoscopy (N/A, 07/19/2013); Hysteroscopy with D & C (N/A, 07/19/2013); Abdominal hysterectomy (05/21/2016); and Hemorroidectomy (04/04/2018). Family: family history includes Alzheimer's disease in her paternal grandfather; Diabetes in her mother; Heart disease in her brother; Hypertension in her father and mother; Kidney disease in her father; Leukemia in her paternal grandmother; Stroke in her maternal grandmother.  Laboratory Chemistry Profile   Renal Lab Results  Component Value Date   BUN 8 07/05/2019   CREATININE 0.84 88/91/6945   BCR NOT APPLICABLE 03/88/8280   GFRAA >60 07/05/2019   GFRNONAA >60 07/05/2019     Hepatic Lab Results  Component Value Date   AST  16 02/18/2019   ALT 11 02/18/2019   ALBUMIN 4.0 02/18/2019   ALKPHOS 37 (L) 02/18/2019   AMYLASE 102 (H) 05/22/2016   LIPASE 31 02/18/2019     Electrolytes Lab Results  Component Value Date   NA 138 07/05/2019   K 3.3 (L) 07/05/2019   CL 106 07/05/2019   CALCIUM 9.4 07/05/2019   MG 1.5 09/15/2017     Bone Lab Results  Component Value Date   VD25OH 34 08/01/2018      Inflammation (CRP: Acute Phase) (ESR: Chronic Phase) Lab Results  Component Value Date   CRP 1.0 03/08/2017   ESRSEDRATE 2 03/08/2017       Note: Above Lab results reviewed.  Recent Imaging Review  CT RENAL STONE STUDY CLINICAL DATA:  Flank pain. Left-sided flank pain.  EXAM: CT ABDOMEN AND PELVIS WITHOUT CONTRAST  TECHNIQUE: Multidetector CT imaging of the abdomen and pelvis was performed following the standard protocol without IV contrast.  COMPARISON:  CT dated 09/09/2017.  FINDINGS: Lower chest: The lung bases are clear. The heart size is normal.  Hepatobiliary: The liver is normal. Normal gallbladder.There is no biliary ductal dilation.  Pancreas: Normal contours without ductal dilatation. No peripancreatic fluid collection.  Spleen: Unremarkable.  Adrenals/Urinary Tract:  --Adrenal glands: Unremarkable.  --Right kidney/ureter: No hydronephrosis or radiopaque kidney stones.  --Left kidney/ureter: There is a nonobstructing stone in the upper pole the left kidney measuring approximately 5 mm.  --Urinary bladder: Unremarkable.  Stomach/Bowel:  --Stomach/Duodenum: No hiatal hernia or other gastric abnormality. Normal duodenal course and caliber.  --Small bowel: Unremarkable.  --Colon: Unremarkable.  --Appendix: Normal.  Vascular/Lymphatic: Normal course and caliber of the major abdominal vessels.  --No retroperitoneal lymphadenopathy.  --No mesenteric lymphadenopathy.  --No pelvic or inguinal lymphadenopathy.  Reproductive: The patient is status post prior hysterectomy. There are few small cysts involving the left ovary.  Other: No ascites or free air. The abdominal wall is normal.  Musculoskeletal. No acute displaced fractures.  IMPRESSION: 1. No acute abdominopelvic abnormality. 2. Nonobstructive left nephrolithiasis.  Electronically Signed   By: Constance Holster M.D.   On: 11/25/2019 19:47 Note: Reviewed        Physical Exam   General appearance: Well nourished, well developed, and well hydrated. In no apparent acute distress Mental status: Alert, oriented x 3 (person, place, & time)       Respiratory: No evidence of acute respiratory distress Eyes: PERLA Vitals: Ht '5\' 6"'  (1.676 m)   Wt 180 lb (81.6 kg)   LMP 07/09/2015 (Exact Date)   BMI 29.05 kg/m  BMI: Estimated body mass index is 29.05 kg/m as calculated from the following:   Height as of this encounter: '5\' 6"'  (1.676 m).   Weight as of this encounter: 180 lb (81.6 kg). Ideal: Ideal body weight: 59.3 kg (130 lb 11.7 oz) Adjusted ideal body weight: 68.2 kg (150 lb 7 oz)  Pelvic pain, normal gait, 5 out of 5 strength bilateral lower extremity: Plantar flexion, dorsiflexion, knee flexion, knee extension.   Assessment   Status Diagnosis  Controlled Controlled Controlled 1. Pelvic pain   2. Chronic pelvic pain in female   3. Endometriosis   4. Cervical radiculitis   5. Dysmetabolic syndrome   6. Chronic pain syndrome   7. Insomnia, persistent      Updated Problems: Problem  Chronic Pain Syndrome  Opiate Use  Pelvic Pain  Cervical Radiculitis  Chronic Pelvic Pain in Female  Endometriosis    Plan of Care  Ms.  Stacey Stanley has a current medication list which includes the following long-term medication(s): hydrochlorothiazide, metformin, promethazine, and trazodone.  Continue with pelvic floor PT Increase in Nucynta from monthly quantity of 30-45 for breakthrough pain during the day. Urine toxicology screen for medication compliance monitoring  Pharmacotherapy (Medications Ordered): Meds ordered this encounter  Medications  . tapentadol HCl (NUCYNTA) 75 MG tablet    Sig: Take 1 tablet (75 mg total) by mouth 2 (two) times daily as needed.    Dispense:  45 tablet    Refill:  0    Chronic pain syndrome  . tapentadol HCl (NUCYNTA) 75 MG tablet    Sig: Take 1 tablet (75 mg total) by mouth 2 (two) times daily as needed.    Dispense:   45 tablet    Refill:  0    Chronic pain syndrome   Orders:  Orders Placed This Encounter  Procedures  . ToxASSURE Select 13 (MW), Urine    Volume: 30 ml(s). Minimum 3 ml of urine is needed. Document temperature of fresh sample. Indications: Long term (current) use of opiate analgesic (K93.267)    Order Specific Question:   Release to patient    Answer:   Immediate  . ToxASSURE Select 13 (MW), Urine    Volume: 30 ml(s). Minimum 3 ml of urine is needed. Document temperature of fresh sample. Indications: Long term (current) use of opiate analgesic [phone])    Order Specific Question:   Release to patient    Answer:   Immediate   Follow-up plan:   Return in about 3 months (around 04/26/2020) for Medication Management, in person.   Recent Visits Date Type Provider Dept  11/01/19 Telemedicine [name], MD [facility]  Showing recent visits within past 90 days and meeting all other requirements Today's Visits Date Type Provider Dept  01/25/20 Office Visit [name], MD [facility]  Showing today's visits and meeting all other requirements Future Appointments Date Type Provider Dept  04/11/20 Appointment [name], MD [facility]  Showing future appointments within next 90 days and meeting all other requirements  I discussed the assessment and treatment plan with the patient. The patient was provided an opportunity to ask questions and all were answered. The patient agreed with the plan and demonstrated an understanding of the instructions.  Patient advised to call back or seek an in-person evaluation if the symptoms or condition worsens.  Duration of encounter: 31mnutes.  Note by: BGillis Santa MD Date: 01/25/2020; Time: 12:07 PM

## 2020-01-27 LAB — TOXASSURE SELECT 13 (MW), URINE

## 2020-02-01 DIAGNOSIS — R102 Pelvic and perineal pain: Secondary | ICD-10-CM | POA: Diagnosis not present

## 2020-02-01 DIAGNOSIS — M62838 Other muscle spasm: Secondary | ICD-10-CM | POA: Diagnosis not present

## 2020-02-01 DIAGNOSIS — M6289 Other specified disorders of muscle: Secondary | ICD-10-CM | POA: Diagnosis not present

## 2020-02-01 DIAGNOSIS — M6281 Muscle weakness (generalized): Secondary | ICD-10-CM | POA: Diagnosis not present
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
## 2020-02-07 ENCOUNTER — Encounter: Payer: Self-pay | Admitting: Family Medicine

## 2020-02-07 ENCOUNTER — Other Ambulatory Visit: Payer: Self-pay

## 2020-02-07 ENCOUNTER — Ambulatory Visit: Payer: Federal, State, Local not specified - PPO | Admitting: Family Medicine

## 2020-02-07 VITALS — BP 114/72 | HR 86 | Temp 98.0°F | Resp 16 | Ht 66.0 in | Wt 184.4 lb

## 2020-02-07 DIAGNOSIS — E8881 Metabolic syndrome: Secondary | ICD-10-CM

## 2020-02-07 DIAGNOSIS — F411 Generalized anxiety disorder: Secondary | ICD-10-CM

## 2020-02-07 DIAGNOSIS — G4700 Insomnia, unspecified: Secondary | ICD-10-CM

## 2020-02-07 DIAGNOSIS — I10 Essential (primary) hypertension: Secondary | ICD-10-CM

## 2020-02-07 DIAGNOSIS — Z23 Encounter for immunization: Secondary | ICD-10-CM | POA: Diagnosis not present

## 2020-02-07 DIAGNOSIS — F339 Major depressive disorder, recurrent, unspecified: Secondary | ICD-10-CM

## 2020-02-07 DIAGNOSIS — E559 Vitamin D deficiency, unspecified: Secondary | ICD-10-CM

## 2020-02-07 DIAGNOSIS — Z13220 Encounter for screening for lipoid disorders: Secondary | ICD-10-CM | POA: Diagnosis not present

## 2020-02-07 DIAGNOSIS — E538 Deficiency of other specified B group vitamins: Secondary | ICD-10-CM

## 2020-02-07 DIAGNOSIS — E041 Nontoxic single thyroid nodule: Secondary | ICD-10-CM

## 2020-02-07 DIAGNOSIS — R7303 Prediabetes: Secondary | ICD-10-CM

## 2020-02-07 MED ORDER — BUPROPION HCL ER (XL) 150 MG PO TB24: 150.0000 mg | ORAL_TABLET | Freq: Every day | ORAL | 1 refills | Status: DC

## 2020-02-07 MED ORDER — HYDROCHLOROTHIAZIDE 12.5 MG PO TABS: 12.5000 mg | ORAL_TABLET | Freq: Every day | ORAL | 1 refills | Status: DC

## 2020-02-07 MED ORDER — OZEMPIC (1 MG/DOSE) 2 MG/1.5ML ~~LOC~~ SOPN: 1.0000 mg | PEN_INJECTOR | SUBCUTANEOUS | 1 refills | Status: DC

## 2020-02-08 LAB — COMPLETE METABOLIC PANEL WITH GFR
AG Ratio: 2.1 (calc) (ref 1.0–2.5)
ALT: 10 U/L (ref 6–29)
AST: 12 U/L (ref 10–35)
Albumin: 4.5 g/dL (ref 3.6–5.1)
Alkaline phosphatase (APISO): 38 U/L (ref 31–125)
BUN: 18 mg/dL (ref 7–25)
CO2: 28 mmol/L (ref 20–32)
Calcium: 10.6 mg/dL — ABNORMAL HIGH (ref 8.6–10.2)
Chloride: 105 mmol/L (ref 98–110)
Creat: 0.96 mg/dL (ref 0.50–1.10)
GFR, Est African American: 82 mL/min/{1.73_m2} (ref >=60)
GFR, Est Non African American: 71 mL/min/{1.73_m2} (ref >=60)
Globulin: 2.1 g/dL (calc) (ref 1.9–3.7)
Glucose, Bld: 87 mg/dL (ref 65–99)
Potassium: 4.3 mmol/L (ref 3.5–5.3)
Sodium: 140 mmol/L (ref 135–146)
Total Bilirubin: 0.3 mg/dL (ref 0.2–1.2)
Total Protein: 6.6 g/dL (ref 6.1–8.1)

## 2020-02-08 LAB — HEMOGLOBIN A1C
Hgb A1c MFr Bld: 5.4 % of total Hgb (ref <5.7)
Mean Plasma Glucose: 108 (calc)
eAG (mmol/L): 6 (calc)

## 2020-02-08 LAB — LIPID PANEL
Cholesterol: 192 mg/dL (ref <200)
HDL: 81 mg/dL (ref >=50)
LDL Cholesterol (Calc): 86 mg/dL (calc)
Non-HDL Cholesterol (Calc): 111 mg/dL (calc) (ref <130)
Total CHOL/HDL Ratio: 2.4 (calc) (ref <5.0)
Triglycerides: 153 mg/dL — ABNORMAL HIGH (ref <150)

## 2020-02-28 DIAGNOSIS — M79674 Pain in right toe(s): Secondary | ICD-10-CM | POA: Diagnosis not present

## 2020-02-28 DIAGNOSIS — M79645 Pain in left finger(s): Secondary | ICD-10-CM | POA: Diagnosis not present

## 2020-02-28 DIAGNOSIS — M79644 Pain in right finger(s): Secondary | ICD-10-CM | POA: Diagnosis not present

## 2020-02-28 DIAGNOSIS — M79675 Pain in left toe(s): Secondary | ICD-10-CM | POA: Diagnosis not present

## 2020-03-25 ENCOUNTER — Telehealth: Payer: Self-pay

## 2020-03-25 NOTE — Telephone Encounter (Signed)
Copied from [place] [id number]. Topic: General - Other >> Mar 25, 2020  2:23 PM Oneta Rack wrote: Reason for CRM:  patient states orthopedic dx her with gout and she feels like its more going on then gout. Patient would like PCP to refer her to rheumatologist

## 2020-03-26 ENCOUNTER — Other Ambulatory Visit: Payer: Self-pay

## 2020-03-26 DIAGNOSIS — M5412 Radiculopathy, cervical region: Secondary | ICD-10-CM

## 2020-03-26 NOTE — Progress Notes (Unsigned)
Patient requested referral to rheumatology.

## 2020-04-02 ENCOUNTER — Ambulatory Visit: Payer: Federal, State, Local not specified - PPO | Attending: Internal Medicine

## 2020-04-02 DIAGNOSIS — Z23 Encounter for immunization: Secondary | ICD-10-CM

## 2020-04-02 NOTE — Progress Notes (Signed)
   Covid-19 Vaccination Clinic  Name:  Stacey Stanley    MRN: [id number] DOB: Nov 30, 1975  04/02/2020  [name] was observed post Covid-19 immunization for 15 minutes without incident. She was provided with Vaccine Information Sheet and instruction to access the V-Safe system.   [name] was instructed to call [phone] with any severe reactions post vaccine: Marland Kitchen Difficulty breathing  . Swelling of face and throat  . A fast heartbeat  . A bad rash all over body  . Dizziness and weakness

## 2020-04-04 NOTE — Progress Notes (Signed)
Cancelled visit

## 2020-04-04 NOTE — Progress Notes (Signed)
Patient ID: Stacey Stanley, female    DOB: 1975/08/08, 44 y.o.   MRN: [id number]  PCP: Steele Sizer, MD  Chief Complaint  Patient presents with  . Leg Pain    Left    Subjective:   Stacey Stanley is a 44 y.o. female, presents to clinic with CC of the following:  Chief Complaint  Patient presents with  . Leg Pain    Left    HPI:  Patient is a 44 year old female patient of Dr. Ancil Boozer Last visit with her was in September 2021 Patient follows up today with leg pain.  Patient is followed by pain management for chronic pelvic pain, last visit with them at the end of August, also with cervical radiculitis. In June she had a CT scan done for left flank pain with the following impression:  IMPRESSION: 1. No acute abdominopelvic abnormality. 2. Nonobstructive left nephrolithiasis.  She presents today with left leg pain.  Last Friday, at work as a Marine scientist, she started to feel some discomfort in the back of the left thigh, had no trauma prior, and she continued to work.  That night, she applied heat, a compression stocking, and over the next couple days use rolling of tennis balls under the left thigh area to try to help.  The discomfort has persisted, and yesterday, she noted it further down the leg briefly, and extending into the upper calf area.  She denied any redness, increased warmth, or pain in the calf.  Denied any swelling.  She denied any bruising or swelling or erythema in the left posterior thigh region as well. She has the weekend off from nursing activities.  She noted she had not been taking the Mobic or Robaxin product, nor Tylenol, does have the opioid and ibuprofen to take for her chronic pain, Nucynta.   Patient Active Problem List   Diagnosis Date Noted  . Chronic pain syndrome 12/28/2017  . Opiate use 12/28/2017  . Long term prescription benzodiazepine use 12/28/2017  . Thrombocytosis 10/26/2017  . Internal hemorrhoids 10/13/2017  . Chronic  idiopathic constipation 10/13/2017  . Rectal bleed 09/10/2017  . Hypomagnesemia 09/10/2017  . Pelvic pain 09/09/2017  . Mass of left ovary 06/28/2017  . Kidney stone on left side 06/28/2017  . Cervical radiculitis 03/08/2017  . Tobacco dependence 10/28/2016  . Vitamin B12 deficiency 08/19/2016  . Prediabetes 08/19/2016  . Right thyroid nodule 07/31/2015  . Chronic pelvic pain in female 04/05/2015  . GERD (gastroesophageal reflux disease) 04/05/2015  . Mild major depression ([place]) 11/25/2014  . Insomnia, persistent 11/25/2014  . Endometriosis 11/25/2014  . Essential (primary) hypertension 11/25/2014  . Dysmetabolic syndrome 21/19/4174  . Obesity (BMI 30-39.9) 11/25/2014      Current Outpatient Medications:  .  aspirin EC 81 MG tablet, Take 81 mg by mouth daily., Disp: , Rfl:  .  Cholecalciferol (VITAMIN D) 50 MCG (2000 UT) CAPS, Take 1 capsule (2,000 Units total) by mouth daily., Disp: 30 capsule, Rfl: 0 .  Dapsone 5 % topical gel, Apply 60 g topically daily., Disp: , Rfl:  .  hydrochlorothiazide (HYDRODIURIL) 12.5 MG tablet, Take 1 tablet (12.5 mg total) by mouth daily., Disp: 90 tablet, Rfl: 1 .  ibuprofen (ADVIL,MOTRIN) 800 MG tablet, Take 800 mg by mouth every 8 (eight) hours as needed., Disp: , Rfl:  .  imiquimod (ALDARA) 5 % cream, Apply 1 packet topically daily., Disp: , Rfl:  .  metFORMIN (GLUCOPHAGE-XR) 750 MG 24 hr tablet, Take  1 tablet (750 mg total) by mouth daily with breakfast., Disp: 90 tablet, Rfl: 1 .  Omega-3 Fatty Acids (FISH OIL) 1000 MG CAPS, Take 4,000 mg by mouth daily., Disp: , Rfl:  .  promethazine (PHENERGAN) 25 MG tablet, Take 0.5-1 tablets (12.5-25 mg total) by mouth every 6 (six) hours as needed for nausea or vomiting., Disp: 30 tablet, Rfl: 0 .  Semaglutide, 1 MG/DOSE, (OZEMPIC, 1 MG/DOSE,) 2 MG/1.5ML SOPN, Inject 0.75 mLs (1 mg total) into the skin once a week., Disp: 27 mL, Rfl: 1 .  tapentadol HCl (NUCYNTA) 75 MG tablet, Take 1 tablet (75 mg total) by  mouth 2 (two) times daily as needed., Disp: 45 tablet, Rfl: 0 .  TRI-LUMA 0.01-4-0.05 % CREA, Apply 30 mg topically daily., Disp: , Rfl:  .  acetaminophen (TYLENOL) 500 MG tablet, Take 1,000 mg by mouth every 8 (eight) hours as needed. (Patient not taking: Reported on 04/05/2020), Disp: , Rfl:  .  buPROPion (WELLBUTRIN XL) 150 MG 24 hr tablet, Take 1 tablet (150 mg total) by mouth daily. (Patient not taking: Reported on 04/05/2020), Disp: 90 tablet, Rfl: 1 .  meloxicam (MOBIC) 15 MG tablet, Take 15 mg by mouth daily. (Patient not taking: Reported on 04/05/2020), Disp: , Rfl:  .  methocarbamol (ROBAXIN) 500 MG tablet, Take by mouth. (Patient not taking: Reported on 04/05/2020), Disp: , Rfl:  .  traZODone (DESYREL) 50 MG tablet, Take 1-2 tablets (50-100 mg total) by mouth at bedtime as needed for sleep. (Patient not taking: Reported on 04/05/2020), Disp: 180 tablet, Rfl: 0 .  TRULANCE 3 MG TABS, Take 1 tablet by mouth daily. (Patient not taking: Reported on 04/05/2020), Disp: , Rfl:    Allergies  Allergen Reactions  . Nuvigil [Armodafinil]     Chest pain      Past Surgical History:  Procedure Laterality Date  . ABDOMINAL HYSTERECTOMY  05/21/2016  . DILATION AND CURETTAGE OF UTERUS    . EYE SURGERY  06/28/13   right laser eye surgery - repair retina  . HEMORROIDECTOMY  04/04/2018   Dr. Levin Bacon   . HYSTEROSCOPY WITH D & C N/A 07/19/2013   Procedure: DILATATION AND CURETTAGE /HYSTEROSCOPY;  Surgeon: Marvene Staff, MD;  Location: [facility];  Service: Gynecology;  Laterality: N/A;  . INDUCED ABORTION    . LAPAROSCOPY  1992   endometriosis  . LAPAROSCOPY N/A 07/19/2013   Procedure: LAPAROSCOPY DIAGNOSTIC  with resection of endometriosis;  Surgeon: Marvene Staff, MD;  Location: [facility];  Service: Gynecology;  Laterality: N/A;  . metatarsil   2003   left foot surgery      Family History  Problem Relation Age of Onset  . Diabetes Mother   . Hypertension Mother   . Hypertension Father    . Kidney disease Father   . Stroke Maternal Grandmother   . Leukemia Paternal Grandmother   . Alzheimer's disease Paternal Grandfather   . Heart disease Brother   . Anesthesia problems Neg Hx   . Hypotension Neg Hx   . Malignant hyperthermia Neg Hx   . Pseudochol deficiency Neg Hx      Social History   Tobacco Use  . Smoking status: Current Every Day Smoker    Packs/day: 0.50    Years: 25.00    Pack years: 12.50    Types: Cigarettes    Start date: 11/25/1993  . Smokeless tobacco: Never Used  Substance Use Topics  . Alcohol use: No    Alcohol/week: 0.0 standard  drinks    With staff assistance, above reviewed with the patient today.  ROS: As per HPI, otherwise no specific complaints on a limited and focused system review   No results found for this or any previous visit (from the past 72 hour(s)).   PHQ2/9: Depression screen [facility] 2/9 02/07/2020 01/25/2020 11/01/2019 08/01/2019 02/01/2019  Decreased Interest 0 0 2 0 0  Down, Depressed, Hopeless 0 0 1 0 0  PHQ - 2 Score 0 0 3 0 0  Altered sleeping - - 3 3 2   Tired, decreased energy - - 1 3 2   Change in appetite - - 0 0 0  Feeling bad or failure about yourself  - - 0 0 0  Trouble concentrating - - 0 0 0  Moving slowly or fidgety/restless - - 0 0 0  Suicidal thoughts - - - 0 0  PHQ-9 Score - - 7 6 4   Difficult doing work/chores - - Somewhat difficult Extremely dIfficult Somewhat difficult  Some recent data might be hidden   PHQ-2/9 Result reviewed  Fall Risk: Fall Risk  04/05/2020 04/05/2020 02/07/2020 01/25/2020 11/01/2019  Falls in the past year? 0 0 0 0 0  Number falls in past yr: 0 0 0 - 0  Injury with Fall? 0 0 0 - 0  Follow up - - - - Falls evaluation completed      Objective:   Vitals:   04/05/20 0836  BP: 100/70  Pulse: 99  Resp: 16  Temp: 97.7 F (36.5 C)  TempSrc: Oral  SpO2: 98%  Weight: 177 lb 14.4 oz (80.7 kg)  Height: 5\' 5"  (1.651 m)    Body mass index is 29.6 kg/m.  Physical Exam   NAD, masked  HEENT - [place]/AT, sclera anicteric, PERRL, EOMI, conj - non-inj'ed,  pharynx clear Ext -no tenderness palpating the anterior hip joint on the left, and no pain with external and internal rotation or abduction/abduction.  Nontender in the trochanteric bursa region. Mildly tender in the mid hamstring area of the left posterior thigh and extending towards the left knee posteriorly, no divot or gap present, no swelling or ecchymosis, no erythema.  Increased discomfort with engaging the hamstring against resistance.  Did have adequate strength on testing. Good knee range of motion with no swelling of the knee noted. Nontender Palpating the left calf area diffusely, with no erythema, no increased warmth, no swelling, no cords, Had good strength with dorsi and plantarflexion of the left foot, but just some very mild discomfort felt slightly in the very upper calf area with dorsi flexion versus resistance.  Posterior tibial pulse good on the left, and sensation grossly intact to light touch in the left lower extremity. Neuro/psychiatric - affect was not flat, appropriate with conversation  Alert with normal speech  Results for orders placed or performed in visit on 02/07/20  COMPLETE METABOLIC PANEL WITH GFR  Result Value Ref Range   Glucose, Bld 87 65 - 99 mg/dL   BUN 18 7 - 25 mg/dL   Creat 0.96 0.50 - 1.10 mg/dL   GFR, Est Non African American 71 > OR = 60 mL/min/1.66m2   GFR, Est African American 82 > OR = 60 mL/min/1.76m2   BUN/Creatinine Ratio NOT APPLICABLE 6 - 22 (calc)   Sodium 140 135 - 146 mmol/L   Potassium 4.3 3.5 - 5.3 mmol/L   Chloride 105 98 - 110 mmol/L   CO2 28 20 - 32 mmol/L   Calcium 10.6 (H) 8.6 - 10.2  mg/dL   Total Protein 6.6 6.1 - 8.1 g/dL   Albumin 4.5 3.6 - 5.1 g/dL   Globulin 2.1 1.9 - 3.7 g/dL (calc)   AG Ratio 2.1 1.0 - 2.5 (calc)   Total Bilirubin 0.3 0.2 - 1.2 mg/dL   Alkaline phosphatase (APISO) 38 31 - 125 U/L   AST 12 10 - 35 U/L   ALT 10 6 - 29 U/L  Lipid panel   Result Value Ref Range   Cholesterol 192 <200 mg/dL   HDL 81 > OR = 50 mg/dL   Triglycerides 153 (H) <150 mg/dL   LDL Cholesterol (Calc) 86 mg/dL (calc)   Total CHOL/HDL Ratio 2.4 <5.0 (calc)   Non-HDL Cholesterol (Calc) 111 <130 mg/dL (calc)  Hemoglobin A1c  Result Value Ref Range   Hgb A1c MFr Bld 5.4 <5.7 % of total Hgb   Mean Plasma Glucose 108 (calc)   eAG (mmol/L) 6.0 (calc)       Assessment & Plan:   1. Hamstring muscle strain, left, initial encounter Educated on the above, with this felt to be a mid hamstring strain.  Noted in the future, that it is best with any acute discomfort to apply ice topically in the first couple days rather than heat. Now recommended contrast therapies, with warmth to the area, followed by gentle range of motion activities as reviewed today, followed by cold, and the importance of cold to helping improve emphasized.  Information also provided in the AVS. She has medicines to take for chronic pain, and discussed how adding any further NSAIDs is not helpful for resolution of the hamstring strain. Discussed physical therapy's involvement at some point, and she would prefer to hold on having them about presently, although if not improving, do feel having there input and involvement will be helpful. 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  Can follow-up if symptoms not improving or more problematic over time, and noted it often takes weeks not days to improve from hamstring issues and the importance of rest in helping these gradually improve over time.        Towanda Malkin, MD 04/05/20 9:19 AM

## 2020-04-05 ENCOUNTER — Other Ambulatory Visit: Payer: Self-pay

## 2020-04-05 ENCOUNTER — Encounter: Payer: Self-pay | Admitting: Internal Medicine

## 2020-04-05 ENCOUNTER — Ambulatory Visit (INDEPENDENT_AMBULATORY_CARE_PROVIDER_SITE_OTHER): Payer: Federal, State, Local not specified - PPO | Admitting: Internal Medicine

## 2020-04-05 VITALS — BP 100/70 | HR 99 | Temp 97.7°F | Resp 16 | Ht 65.0 in | Wt 177.9 lb

## 2020-04-05 DIAGNOSIS — S76312A Strain of muscle, fascia and tendon of the posterior muscle group at thigh level, left thigh, initial encounter: Secondary | ICD-10-CM | POA: Diagnosis not present

## 2020-04-05 NOTE — Patient Instructions (Signed)
As we discussed, can apply warmth to the area, followed by some gentle motion activities, and then apply some cold to the area topically to help lessen inflammation   Hamstring Strain  A hamstring strain happens when the muscles in the back of the thighs (hamstring muscles) are overstretched or torn. The hamstring muscles are used in straightening the hips, bending the knees, and pulling back the legs. This injury is often called a pulled hamstring muscle. The tissue that connects the muscle to a bone (tendon) may also be affected. The severity of a hamstring strain may be rated in degrees or grades. First-degree (or grade 1) strains have the least amount of muscle tearing and pain. Second-degree and third-degree (grade 2 and 3) strains have increasingly more tearing and pain. What are the causes? This condition is caused by a sudden, violent force being placed on the hamstring muscles, stretching them too far. This often happens during activities that involve running, jumping, kicking, or weight lifting. What increases the risk? Hamstring strains are especially common in athletes. The following factors may also make you more likely to develop this condition:  Having low strength, endurance, or flexibility of the hamstring muscles.  Doing high-impact physical activity or sports.  Having poor physical fitness.  Having a previous leg injury.  Having tired (fatigued) muscles. What are the signs or symptoms? Symptoms of this condition include:  Pain in the back of the thigh.  Swelling.  Bruising.  Muscle spasms.  Trouble moving the affected muscle because of pain. For severe strains, you may feel popping or snapping in the back of your thigh when the injury occurs. How is this diagnosed? This condition is diagnosed based on your symptoms, your medical history, and a physical exam. How is this treated? Treatment for this condition usually involves:  Protecting, resting, icing,  applying compression, and elevating the injured area (PRICE therapy).  Medicines. Your health care provider may recommend medicines to help reduce pain or inflammation.  Doing exercises to regain strength and flexibility in the muscles. Your health care provider will tell you when it is okay to begin exercising. Follow these instructions at home: PRICE therapy Use PRICE therapy to promote muscle healing during the first 2-3 days after your injury, or as told by your health care provider.  Protect the muscle from being injured again.  Rest your injury. This usually involves limiting your normal activities and not using the injured hamstring muscle. Talk with your health care provider about how you should limit your activities.  Apply ice to the injured area: ? Put ice in a plastic bag. ? Place a towel between your skin and the bag. ? Leave the ice on for 20 minutes, 2-3 times a day. After the third day, switch to applying heat as told.  Put pressure (compression) on your injured hamstring by wrapping it with an elastic bandage. Be careful not to wrap it too tightly. That may interfere with blood circulation or may increase swelling.  Raise (elevate) your injured hamstring above the level of your heart as often as possible. When you are lying down, you can do this by putting a pillow under your thigh.  Activity  Begin exercising or stretching only as told by your health care provider.  Do not return to full activity level until your health care provider approves.  To help prevent muscle strains in the future, always warm up before exercising and stretch afterward. General instructions  Take over-the-counter and prescription medicines only as  told by your health care provider.  If directed, apply heat to the affected area as often as told by your health care provider. Use the heat source that your health care provider recommends, such as a moist heat pack or a heating pad. ? Place a  towel between your skin and the heat source. ? Leave the heat on for 20-30 minutes. ? Remove the heat if your skin turns bright red. This is especially important if you are unable to feel pain, heat, or cold. You may have a greater risk of getting burned.  Keep all follow-up visits as told by your health care provider. This is important. Contact a health care provider if you have:  Increasing pain or swelling in the injured area.  Numbness, tingling, or a significant loss of strength in the injured area. Get help right away if:  Your foot or your toes become cold or turn blue. Summary  A hamstring strain happens when the muscles in the back of the thighs (hamstring muscles) are overstretched or torn.  This injury can be caused by a sudden, violent force being placed on the hamstring muscles, causing them to stretch too far.  Symptoms include pain, swelling, and muscle spasms in the injured area.  Treatment includes what is called PRICE therapy: protecting, resting, icing, applying compression, and elevating the injured area. This information is not intended to replace advice given to you by your health care provider. Make sure you discuss any questions you have with your health care provider. Document Revised: 04/30/2017 Document Reviewed: 04/15/2017 Elsevier Patient Education  2020 [employer].

## 2020-04-11 ENCOUNTER — Ambulatory Visit
Payer: Federal, State, Local not specified - PPO | Attending: Student in an Organized Health Care Education/Training Program | Admitting: Student in an Organized Health Care Education/Training Program

## 2020-04-11 ENCOUNTER — Encounter: Payer: Self-pay | Admitting: Student in an Organized Health Care Education/Training Program

## 2020-04-11 ENCOUNTER — Other Ambulatory Visit: Payer: Self-pay

## 2020-04-11 VITALS — BP 135/96 | HR 89 | Temp 97.5°F | Resp 16 | Ht 65.0 in | Wt 177.0 lb

## 2020-04-11 DIAGNOSIS — G4700 Insomnia, unspecified: Secondary | ICD-10-CM

## 2020-04-11 DIAGNOSIS — N809 Endometriosis, unspecified: Secondary | ICD-10-CM | POA: Diagnosis not present

## 2020-04-11 DIAGNOSIS — R102 Pelvic and perineal pain: Secondary | ICD-10-CM

## 2020-04-11 DIAGNOSIS — G8929 Other chronic pain: Secondary | ICD-10-CM

## 2020-04-11 DIAGNOSIS — M5412 Radiculopathy, cervical region: Secondary | ICD-10-CM | POA: Diagnosis not present

## 2020-04-11 DIAGNOSIS — F320 Major depressive disorder, single episode, mild: Secondary | ICD-10-CM

## 2020-04-11 DIAGNOSIS — E8881 Metabolic syndrome: Secondary | ICD-10-CM | POA: Diagnosis not present

## 2020-04-11 DIAGNOSIS — G894 Chronic pain syndrome: Secondary | ICD-10-CM

## 2020-04-11 MED ORDER — TAPENTADOL HCL 75 MG PO TABS: 75.0000 mg | ORAL_TABLET | Freq: Two times a day (BID) | ORAL | 0 refills | Status: AC | PRN

## 2020-04-11 MED ORDER — TAPENTADOL HCL 75 MG PO TABS: 75.0000 mg | ORAL_TABLET | Freq: Two times a day (BID) | ORAL | 0 refills | Status: DC | PRN

## 2020-04-11 NOTE — Patient Instructions (Signed)
Nucynta to last until 07/23/2020 has been escribed to your pharmacy.

## 2020-04-11 NOTE — Progress Notes (Signed)
PROVIDER NOTE: Information contained herein reflects review and annotations entered in association with encounter. Interpretation of such information and data should be left to medically-trained personnel. Information provided to patient can be located elsewhere in the medical record under "Patient Instructions". Document created using STT-dictation technology, any transcriptional errors that may result from process are unintentional.    Patient: Stacey Stanley  Service Category: E/M  Provider: [name], MD  DOB: [birth year]  DOS: 04/11/2020  Specialty: Interventional Pain Management  MRN: [id number]  Setting: Ambulatory outpatient  PCP: Steele Sizer, MD  Type: Established Patient    Referring Provider: Steele Sizer, MD  Location: Office  Delivery: Face-to-face     HPI  [name], a 44 y.o. year old female, is here today because of her Pelvic pain [R10.2]. Ms. Malina primary complain today is Pelvic Pain Last encounter: My last encounter with her was on 01/25/2020. Pertinent problems: Ms. Vezina has Endometriosis; Chronic pelvic pain in female; Cervical radiculitis; Pelvic pain; Chronic pain syndrome; and Opiate use on their pertinent problem list. Pain Assessment: Severity of Chronic pain is reported as a 3 /10. Location: Pelvis Left/radiates around to left side, patient has reoccuring cysts. Onset: More than a month ago. Quality: Aching, Sharp, Constant. Timing: Constant. Modifying factor(s): medication, rest. Vitals:  height is 5' 5" (1.651 m) and weight is 177 lb (80.3 kg). Her temperature is 97.5 F (36.4 C) (abnormal). Her blood pressure is 135/96 (abnormal) and her pulse is 89. Her respiration is 16 and oxygen saturation is 100%.   Reason for encounter: medication management.   Left lower quadrant pain, referred by her gynecologist to [facility].  Is awaiting pelvic ultrasound.  She does have a history of kidney stones and last CT in June showed nonobstructive kidney stone.   Patient continues multimodal pain regimen as prescribed.  States that it provides pain relief and improvement in functional status.   Pharmacotherapy Assessment   03/25/2020  01/25/2020   1  Nucynta 75 Mg Tablet 45.00  30  Bi Lat  [id number]  Wal ([id number])  0/0  45.00 MME  Comm Ins  [place]      Analgesic: Nucynta 75 mg twice daily as needed, quantity 45/month; MME equals 45   Monitoring: [place] PMP: PDMP not reviewed this encounter.       Pharmacotherapy: No side-effects or adverse reactions reported. Compliance: No problems identified. Effectiveness: Clinically acceptable.  Ignatius Specking, RN  04/11/2020  3:13 PM  Sign when Signing Visit Nursing Pain Medication Assessment:  Safety precautions to be maintained throughout the outpatient stay will include: orient to surroundings, keep bed in low position, maintain call bell within reach at all times, provide assistance with transfer out of bed and ambulation.  Medication Inspection Compliance: Pill count conducted under aseptic conditions, in front of the patient. Neither the pills nor the bottle was removed from the patient's sight at any time. Once count was completed pills were immediately returned to the patient in their original bottle.  Medication: lucynta 55m Pill/Patch Count: 28 of 45 pills remain Pill/Patch Appearance: Markings consistent with prescribed medication Bottle Appearance: Standard pharmacy container. Clearly labeled. Filled Date: 10 / 25 / 2021 Last Medication intake:  Today   UDS:  Summary  Date Value Ref Range Status  01/25/2020 Note  Final    Comment:    ==================================================================== [employer] Select 13 (MW) ==================================================================== Test  Result       Flag       Units  Drug Present and Declared for Prescription Verification   Tapentadol                     372          EXPECTED   ng/mg creat    Source of  tapentadol is a scheduled prescription medication.  ==================================================================== Test                      Result    Flag   Units      Ref Range   Creatinine              141              mg/dL      >=20 ==================================================================== Declared Medications:  The flagging and interpretation on this report are based on the  following declared medications.  Unexpected results may arise from  inaccuracies in the declared medications.   **Note: The testing scope of this panel includes these medications:   Tapentadol (Nucynta)   **Note: The testing scope of this panel does not include the  following reported medications:   Acetaminophen (Tylenol)  Aspirin  Dapsone  Fish Oil  Hydrochlorothiazide  Ibuprofen (Advil)  Imiquimod (Aldara)  Metformin  Plecanatide (Trulance)  Promethazine (Phenergan)  Semaglutide (Ozempic)  Topical  Trazodone (Desyrel)  Vitamin D ==================================================================== For clinical consultation, please call [phone]. ====================================================================      ROS  Constitutional: Denies any fever or chills Gastrointestinal: No reported hemesis, hematochezia, vomiting, or acute GI distress Musculoskeletal: Diffuse low back, pelvic pain Neurological: No reported episodes of acute onset apraxia, aphasia, dysarthria, agnosia, amnesia, paralysis, loss of coordination, or loss of consciousness  Medication Review  Dapsone, Fish Oil, Fluocin-Hydroquinone-Tretinoin, Semaglutide (1 MG/DOSE), aspirin EC, hydrochlorothiazide, ibuprofen, imiquimod, metFORMIN, promethazine, and tapentadol HCl  History Review  Allergy: Ms. Larue is allergic to nuvigil [armodafinil]. Drug: Ms. Augusta  reports no history of drug use. Alcohol:  reports no history of alcohol use. Tobacco:  reports that she has been smoking cigarettes. She  started smoking about 26 years ago. She has a 12.50 pack-year smoking history. She has never used smokeless tobacco. Social: Ms. Gau  reports that she has been smoking cigarettes. She started smoking about 26 years ago. She has a 12.50 pack-year smoking history. She has never used smokeless tobacco. She reports that she does not drink alcohol and does not use drugs. Medical:  has a past medical history of Anxiety, Arthritis, Chronic insomnia, Depression, Diabetes mellitus without complication ([place]), Dysrhythmia, Endometriosis, GERD (gastroesophageal reflux disease), Gout, Headache(784.0), Hypertension, Kidney stones, Metabolic syndrome, Obesity, Serum calcium elevated, Sleep disorder, circadian, shift work type, SVD (spontaneous vaginal delivery), and Tachycardia. Surgical: Ms. Kathman  has a past surgical history that includes metatarsil  (2003); Dilation and curettage of uterus; Induced abortion; laparoscopy (1992); Eye surgery (06/28/13); laparoscopy (N/A, 07/19/2013); Hysteroscopy with D & C (N/A, 07/19/2013); Abdominal hysterectomy (05/21/2016); and Hemorroidectomy (04/04/2018). Family: family history includes Alzheimer's disease in her paternal grandfather; Diabetes in her mother; Heart disease in her brother; Hypertension in her father and mother; Kidney disease in her father; Leukemia in her paternal grandmother; Stroke in her maternal grandmother.  Laboratory Chemistry Profile   Renal Lab Results  Component Value Date   BUN 18 02/07/2020   CREATININE 0.96 94/85/4627   BCR NOT APPLICABLE 03/50/0938   GFRAA 82 02/07/2020   GFRNONAA  71 02/07/2020     Hepatic Lab Results  Component Value Date   AST 12 02/07/2020   ALT 10 02/07/2020   ALBUMIN 4.0 02/18/2019   ALKPHOS 37 (L) 02/18/2019   AMYLASE 102 (H) 05/22/2016   LIPASE 31 02/18/2019     Electrolytes Lab Results  Component Value Date   NA 140 02/07/2020   K 4.3 02/07/2020   CL 105 02/07/2020   CALCIUM 10.6 (H) 02/07/2020    MG 1.5 09/15/2017     Bone Lab Results  Component Value Date   VD25OH 34 08/01/2018     Inflammation (CRP: Acute Phase) (ESR: Chronic Phase) Lab Results  Component Value Date   CRP 1.0 03/08/2017   ESRSEDRATE 2 03/08/2017       Note: Above Lab results reviewed.  Recent Imaging Review  CT RENAL STONE STUDY CLINICAL DATA:  Flank pain. Left-sided flank pain.  EXAM: CT ABDOMEN AND PELVIS WITHOUT CONTRAST  TECHNIQUE: Multidetector CT imaging of the abdomen and pelvis was performed following the standard protocol without IV contrast.  COMPARISON:  CT dated 09/09/2017.  FINDINGS: Lower chest: The lung bases are clear. The heart size is normal.  Hepatobiliary: The liver is normal. Normal gallbladder.There is no biliary ductal dilation.  Pancreas: Normal contours without ductal dilatation. No peripancreatic fluid collection.  Spleen: Unremarkable.  Adrenals/Urinary Tract:  --Adrenal glands: Unremarkable.  --Right kidney/ureter: No hydronephrosis or radiopaque kidney stones.  --Left kidney/ureter: There is a nonobstructing stone in the upper pole the left kidney measuring approximately 5 mm.  --Urinary bladder: Unremarkable.  Stomach/Bowel:  --Stomach/Duodenum: No hiatal hernia or other gastric abnormality. Normal duodenal course and caliber.  --Small bowel: Unremarkable.  --Colon: Unremarkable.  --Appendix: Normal.  Vascular/Lymphatic: Normal course and caliber of the major abdominal vessels.  --No retroperitoneal lymphadenopathy.  --No mesenteric lymphadenopathy.  --No pelvic or inguinal lymphadenopathy.  Reproductive: The patient is status post prior hysterectomy. There are few small cysts involving the left ovary.  Other: No ascites or free air. The abdominal wall is normal.  Musculoskeletal. No acute displaced fractures.  IMPRESSION: 1. No acute abdominopelvic abnormality. 2. Nonobstructive left nephrolithiasis.  Electronically Signed    By: Constance Holster M.D.   On: 11/25/2019 19:47 Note: Reviewed        Physical Exam  General appearance: Well nourished, well developed, and well hydrated. In no apparent acute distress Mental status: Alert, oriented x 3 (person, place, & time)       Respiratory: No evidence of acute respiratory distress Eyes: PERLA Vitals: BP (!) 135/96   Pulse 89   Temp (!) 97.5 F (36.4 C)   Resp 16   Ht 5' 5" (1.651 m)   Wt 177 lb (80.3 kg)   LMP 07/09/2015 (Exact Date)   SpO2 100%   BMI 29.45 kg/m  BMI: Estimated body mass index is 29.45 kg/m as calculated from the following:   Height as of this encounter: 5' 5" (1.651 m).   Weight as of this encounter: 177 lb (80.3 kg). Ideal: Ideal body weight: 57 kg (125 lb 10.6 oz) Adjusted ideal body weight: 66.3 kg (146 lb 3.2 oz)  Pelvic pain, normal gait, 5 out of 5 strength bilateral lower extremity: Plantar flexion, dorsiflexion, knee flexion, knee extension.  Assessment   Status Diagnosis  Controlled Controlled Controlled 1. Pelvic pain   2. Chronic pelvic pain in female   3. Endometriosis   4. Cervical radiculitis   5. Dysmetabolic syndrome   6. Chronic pain syndrome  7. Insomnia, persistent   8. Mild major depression ([place])       Plan of Care  [name] has a current medication list which includes the following long-term medication(s): hydrochlorothiazide, metformin, and promethazine.  Pharmacotherapy (Medications Ordered): Meds ordered this encounter  Medications  . tapentadol HCl (NUCYNTA) 75 MG tablet    Sig: Take 1 tablet (75 mg total) by mouth 2 (two) times daily as needed.    Dispense:  45 tablet    Refill:  0    Chronic pain syndrome  . tapentadol HCl (NUCYNTA) 75 MG tablet    Sig: Take 1 tablet (75 mg total) by mouth 2 (two) times daily as needed.    Dispense:  45 tablet    Refill:  0    Chronic pain syndrome  . tapentadol HCl (NUCYNTA) 75 MG tablet    Sig: Take 1 tablet (75 mg total) by mouth 2  (two) times daily as needed.    Dispense:  45 tablet    Refill:  0    Chronic pain syndrome   Follow-up plan:   Return in about 3 months (around 07/12/2020) for Medication Management, in person.   Recent Visits Date Type Provider Dept  01/25/20 Office Visit [name], MD [facility]  Showing recent visits within past 90 days and meeting all other requirements Today's Visits Date Type Provider Dept  04/11/20 Office Visit [name], MD [facility]  Showing today's visits and meeting all other requirements Future Appointments Date Type Provider Dept  07/08/20 Appointment [name], MD [facility]  Showing future appointments within next 90 days and meeting all other requirements  I discussed the assessment and treatment plan with the patient. The patient was provided an opportunity to ask questions and all were answered. The patient agreed with the plan and demonstrated an understanding of the instructions.  Patient advised to call back or seek an in-person evaluation if the symptoms or condition worsens.  Duration of encounter: 83mnutes.  Note by: BGillis Santa MD Date: 04/11/2020; Time: 3:34 PM

## 2020-04-11 NOTE — Progress Notes (Signed)
Nursing Pain Medication Assessment:  Safety precautions to be maintained throughout the outpatient stay will include: orient to surroundings, keep bed in low position, maintain call bell within reach at all times, provide assistance with transfer out of bed and ambulation.  Medication Inspection Compliance: Pill count conducted under aseptic conditions, in front of the patient. Neither the pills nor the bottle was removed from the patient's sight at any time. Once count was completed pills were immediately returned to the patient in their original bottle.  Medication: lucynta 75mg  Pill/Patch Count: 28 of 45 pills remain Pill/Patch Appearance: Markings consistent with prescribed medication Bottle Appearance: Standard pharmacy container. Clearly labeled. Filled Date: 10 / 25 / 2021 Last Medication intake:  Today

## 2020-04-24 ENCOUNTER — Ambulatory Visit (INDEPENDENT_AMBULATORY_CARE_PROVIDER_SITE_OTHER): Payer: Federal, State, Local not specified - PPO | Admitting: Family Medicine

## 2020-04-24 ENCOUNTER — Encounter: Payer: Self-pay | Admitting: Family Medicine

## 2020-04-24 ENCOUNTER — Other Ambulatory Visit: Payer: Self-pay

## 2020-04-24 VITALS — BP 132/88 | HR 100 | Temp 98.5°F | Resp 17 | Ht 65.0 in | Wt 177.7 lb

## 2020-04-24 DIAGNOSIS — R309 Painful micturition, unspecified: Secondary | ICD-10-CM | POA: Diagnosis not present

## 2020-04-24 DIAGNOSIS — R310 Gross hematuria: Secondary | ICD-10-CM

## 2020-04-24 DIAGNOSIS — N200 Calculus of kidney: Secondary | ICD-10-CM

## 2020-04-24 DIAGNOSIS — R300 Dysuria: Secondary | ICD-10-CM | POA: Diagnosis not present

## 2020-04-24 LAB — POCT URINALYSIS DIPSTICK
Bilirubin, UA: NEGATIVE
Blood, UA: NEGATIVE
Glucose, UA: NEGATIVE
Ketones, UA: NEGATIVE
Leukocytes, UA: NEGATIVE
Nitrite, UA: NEGATIVE
Protein, UA: NEGATIVE
Spec Grav, UA: 1.01 (ref 1.010–1.025)
Urobilinogen, UA: 0.2 E.U./dL
pH, UA: 7 (ref 5.0–8.0)

## 2020-04-24 MED ORDER — CIPROFLOXACIN HCL 250 MG PO TABS: 250.0000 mg | ORAL_TABLET | Freq: Two times a day (BID) | ORAL | 0 refills | Status: DC

## 2020-04-24 NOTE — Progress Notes (Signed)
Name: Stacey Stanley   MRN: [id number]    DOB: [birth year]   Date:04/24/2020       Progress Note  Subjective  Chief Complaint  Acute Visit for suspected UTI   HPI  Hematuria: she states about 10 days ago she had gross hematuria ( s/p hysterectomy), she states since that time she started to noticed dysuria. She denies nausea, vomiting, fever or chills. She has a history of kidney stones but never passed a stone before.  She denies back pain, abdominal pain , just has noticed dysuria and urinary urgency, no incontinence.    Patient Active Problem List   Diagnosis Date Noted   Chronic pain syndrome 12/28/2017   Opiate use 12/28/2017   Long term prescription benzodiazepine use 12/28/2017   Thrombocytosis 10/26/2017   Internal hemorrhoids 10/13/2017   Chronic idiopathic constipation 10/13/2017   Rectal bleed 09/10/2017   Hypomagnesemia 09/10/2017   Pelvic pain 09/09/2017   Mass of left ovary 06/28/2017   Kidney stone on left side 06/28/2017   Cervical radiculitis 03/08/2017   Tobacco dependence 10/28/2016   Vitamin B12 deficiency 08/19/2016   Prediabetes 08/19/2016   Right thyroid nodule 07/31/2015   Chronic pelvic pain in female 04/05/2015   GERD (gastroesophageal reflux disease) 04/05/2015   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 Levin Bacon    HYSTEROSCOPY WITH D & C N/A 07/19/2013   Procedure: DILATATION AND CURETTAGE [place];  Surgeon: Marvene Staff, MD;  Location: [facility];  Service: Gynecology;  Laterality: N/A;   INDUCED  ABORTION     LAPAROSCOPY  1992   endometriosis   LAPAROSCOPY N/A 07/19/2013   Procedure: LAPAROSCOPY DIAGNOSTIC  with resection of endometriosis;  Surgeon: Marvene Staff, MD;  Location: [facility];  Service: Gynecology;  Laterality: N/A;   metatarsil   2003   left foot surgery     Family History  Problem Relation Age of Onset   Diabetes Mother    Hypertension Mother    Hypertension Father    Kidney disease Father    Stroke Maternal Grandmother    Leukemia Paternal Grandmother    Alzheimer's disease Paternal Grandfather    Heart disease Brother    Anesthesia problems Neg Hx    Hypotension Neg Hx    Malignant hyperthermia Neg Hx    Pseudochol deficiency Neg Hx     Social History   Tobacco Use   Smoking status: Current Every Day Smoker    Packs/day: 0.50    Years: 25.00    Pack years: 12.50    Types: Cigarettes    Start date: 11/25/1993   Smokeless tobacco: Never Used  Substance Use Topics   Alcohol use: No    Alcohol/week: 0.0 standard drinks     Current Outpatient Medications:    aspirin EC 81 MG tablet, Take 81 mg by mouth daily., Disp: , Rfl:    Dapsone 5 % topical gel, Apply 60 g topically daily., Disp: , Rfl:    hydrochlorothiazide (HYDRODIURIL) 12.5 MG tablet, Take 1 tablet (12.5 mg total) by mouth daily., Disp: 90 tablet, Rfl:  1   ibuprofen (ADVIL,MOTRIN) 800 MG tablet, Take 800 mg by mouth every 8 (eight) hours as needed., Disp: , Rfl:    imiquimod (ALDARA) 5 % cream, Apply 1 packet topically daily., Disp: , Rfl:    metFORMIN (GLUCOPHAGE-XR) 750 MG 24 hr tablet, Take 1 tablet (750 mg total) by mouth daily with breakfast., Disp: 90 tablet, Rfl: 1   Omega-3 Fatty Acids (FISH OIL) 1000 MG CAPS, Take 4,000 mg by mouth daily., Disp: , Rfl:    promethazine (PHENERGAN) 25 MG tablet, Take 0.5-1 tablets (12.5-25 mg total) by mouth every 6 (six) hours as needed for nausea or vomiting., Disp: 30 tablet, Rfl: 0   Semaglutide, 1 MG/DOSE,  (OZEMPIC, 1 MG/DOSE,) 2 MG/1.5ML SOPN, Inject 0.75 mLs (1 mg total) into the skin once a week., Disp: 27 mL, Rfl: 1   tapentadol HCl (NUCYNTA) 75 MG tablet, Take 1 tablet (75 mg total) by mouth 2 (two) times daily as needed., Disp: 45 tablet, Rfl: 0   [START ON 05/24/2020] tapentadol HCl (NUCYNTA) 75 MG tablet, Take 1 tablet (75 mg total) by mouth 2 (two) times daily as needed., Disp: 45 tablet, Rfl: 0   [START ON 06/23/2020] tapentadol HCl (NUCYNTA) 75 MG tablet, Take 1 tablet (75 mg total) by mouth 2 (two) times daily as needed., Disp: 45 tablet, Rfl: 0   TRI-LUMA 0.01-4-0.05 % CREA, Apply 30 mg topically daily., Disp: , Rfl:   Allergies  Allergen Reactions   Nuvigil [Armodafinil]     Chest pain     I personally reviewed active problem list, medication list, allergies, family history, social history, health maintenance with the patient/caregiver today.   ROS  Ten systems reviewed and is negative except as mentioned in HPI   Objective  Vitals:   04/24/20 1030  BP: 132/88  Pulse: 100  Resp: 17  Temp: 98.5 F (36.9 C)  TempSrc: Oral  SpO2: 99%  Weight: 177 lb 11.2 oz (80.6 kg)  Height: 5\' 5"  (1.651 m)    Body mass index is 29.57 kg/m.  Physical Exam  Constitutional: Patient appears well-developed and well-nourished.  No distress.  HEENT: head atraumatic, normocephalic, pupils equal and reactive to light,  neck supple Cardiovascular: Normal rate, regular rhythm and normal heart sounds.  No murmur heard. No BLE edema. Pulmonary/Chest: Effort normal and breath sounds normal. No respiratory distress. Abdominal: Soft.  There is mild supra pubic  tenderness. Negative CVA tenderness  Psychiatric: Patient has a normal mood and affect. behavior is normal. Judgment and thought content normal.  Recent Results (from the past 2160 hour(s))  Tox[employer] Select 13 (MW), Urine     Status: None   Collection Time: 01/25/20  4:00 PM  Result Value Ref Range   Summary Note     Comment:  ==================================================================== Tox[employer] Select 13 (MW) ==================================================================== Test                             Result       Flag       Units  Drug Present and Declared for Prescription Verification   Tapentadol                     372          EXPECTED   ng/mg creat    Source of tapentadol is a scheduled prescription medication.  ==================================================================== Test  Result    Flag   Units      Ref Range   Creatinine              141              mg/dL      >=20 ==================================================================== Declared Medications:  The flagging and interpretation on this report are based on the  following declared medications.  Unexpected results may arise from  inaccuracies in the declared medications.   **Note: The testing scope of this panel includes these medications:   Tapentadol (Nucynta)   **Note:  The testing scope of this panel does not include the  following reported medications:   Acetaminophen (Tylenol)  Aspirin  Dapsone  Fish Oil  Hydrochlorothiazide  Ibuprofen (Advil)  Imiquimod (Aldara)  Metformin  Plecanatide (Trulance)  Promethazine (Phenergan)  Semaglutide (Ozempic)  Topical  Trazodone (Desyrel)  Vitamin D ==================================================================== For clinical consultation, please call [phone]. ====================================================================   COMPLETE METABOLIC PANEL WITH GFR     Status: Abnormal   Collection Time: 02/07/20  9:22 AM  Result Value Ref Range   Glucose, Bld 87 65 - 99 mg/dL    Comment: .            Fasting reference interval .    BUN 18 7 - 25 mg/dL   Creat 0.96 0.50 - 1.10 mg/dL   GFR, Est Non African American 71 > OR = 60 mL/min/1.70m2   GFR, Est African American 82 > OR = 60 mL/min/1.51m2   BUN/Creatinine Ratio  NOT APPLICABLE 6 - 22 (calc)   Sodium 140 135 - 146 mmol/L   Potassium 4.3 3.5 - 5.3 mmol/L   Chloride 105 98 - 110 mmol/L   CO2 28 20 - 32 mmol/L   Calcium 10.6 (H) 8.6 - 10.2 mg/dL   Total Protein 6.6 6.1 - 8.1 g/dL   Albumin 4.5 3.6 - 5.1 g/dL   Globulin 2.1 1.9 - 3.7 g/dL (calc)   AG Ratio 2.1 1.0 - 2.5 (calc)   Total Bilirubin 0.3 0.2 - 1.2 mg/dL   Alkaline phosphatase (APISO) 38 31 - 125 U/L   AST 12 10 - 35 U/L   ALT 10 6 - 29 U/L  Lipid panel     Status: Abnormal   Collection Time: 02/07/20  9:22 AM  Result Value Ref Range   Cholesterol 192 <200 mg/dL   HDL 81 > OR = 50 mg/dL   Triglycerides 153 (H) <150 mg/dL   LDL Cholesterol (Calc) 86 mg/dL (calc)    Comment: Reference range: <100 . Desirable range <100 mg/dL for primary prevention;   <70 mg/dL for patients with CHD or diabetic patients  with > or = 2 CHD risk factors. Marland Kitchen LDL-C is now calculated using the Martin-Hopkins  calculation, which is a validated novel method providing  better accuracy than the Friedewald equation in the  estimation of LDL-C.  Cresenciano Genre et al. Annamaria Helling. 1517;616(07): 2061-2068  (http://education.[facility].com/faq/FAQ164)    Total CHOL/HDL Ratio 2.4 <5.0 (calc)   Non-HDL Cholesterol (Calc) 111 <130 mg/dL (calc)    Comment: For patients with diabetes plus 1 major ASCVD risk  factor, treating to a non-HDL-C goal of <100 mg/dL  (LDL-C of <70 mg/dL) is considered a therapeutic  option.   Hemoglobin A1c     Status: None   Collection Time: 02/07/20  9:22 AM  Result Value Ref Range   Hgb A1c MFr Bld 5.4 <5.7 % of total Hgb  Comment: For the purpose of screening for the presence of diabetes: . <5.7%       Consistent with the absence of diabetes 5.7-6.4%    Consistent with increased risk for diabetes             (prediabetes) > or =6.5%  Consistent with diabetes . This assay result is consistent with a decreased risk of diabetes. . Currently, no consensus exists regarding use  of hemoglobin A1c for diagnosis of diabetes in children. . According to American Diabetes Association (ADA) guidelines, hemoglobin A1c <7.0% represents optimal control in non-pregnant diabetic patients. Different metrics may apply to specific patient populations.  Standards of Medical Care in Diabetes(ADA). .    Mean Plasma Glucose 108 (calc)   eAG (mmol/L) 6.0 (calc)  POCT Urinalysis Dipstick     Status: Normal   Collection Time: 04/24/20 10:33 AM  Result Value Ref Range   Color, UA Yellow    Clarity, UA Clear    Glucose, UA Negative Negative   Bilirubin, UA Negative    Ketones, UA Negative    Spec Grav, UA 1.010 1.010 - 1.025   Blood, UA Negative    pH, UA 7.0 5.0 - 8.0   Protein, UA Negative Negative   Urobilinogen, UA 0.2 0.2 or 1.0 E.U./dL   Nitrite, UA Negative    Leukocytes, UA Negative Negative   Appearance Clear    Odor None     PHQ2/9: Depression screen [facility] 2/9 04/24/2020 02/07/2020 01/25/2020 11/01/2019 08/01/2019  Decreased Interest 0 0 0 2 0  Down, Depressed, Hopeless 0 0 0 1 0  PHQ - 2 Score 0 0 0 3 0  Altered sleeping - - - 3 3  Tired, decreased energy - - - 1 3  Change in appetite - - - 0 0  Feeling bad or failure about yourself  - - - 0 0  Trouble concentrating - - - 0 0  Moving slowly or fidgety/restless - - - 0 0  Suicidal thoughts - - - - 0  PHQ-9 Score - - - 7 6  Difficult doing work/chores - - - Somewhat difficult Extremely dIfficult  Some recent data might be hidden    phq 9 is negative   Fall Risk: Fall Risk  04/24/2020 04/11/2020 04/05/2020 04/05/2020 02/07/2020  Falls in the past year? 0 0 0 0 0  Number falls in past yr: 0 0 0 0 0  Injury with Fall? 0 0 0 0 0  Follow up - - - - -     Functional Status Survey: Is the patient deaf or have difficulty hearing?: No Does the patient have difficulty seeing, even when wearing glasses/contacts?: No Does the patient have difficulty concentrating, remembering, or making decisions?: No Does the  patient have difficulty walking or climbing stairs?: No Does the patient have difficulty dressing or bathing?: No Does the patient have difficulty doing errands alone such as visiting a doctor's office or shopping?: No    Assessment & Plan  1. Dysuria  Explained that if urine culture is negative for infection she will need to see an Urologist for further evaluation of gross hematuria  - POCT Urinalysis Dipstick - Urine Culture - ciprofloxacin (CIPRO) 250 MG tablet; Take 1 tablet (250 mg total) by mouth 2 (two) times daily.  Dispense: 10 tablet; Refill: 0  2. Bilateral kidney stones  Found on CT done in 2019 for ovarian cyst and pelvic pain - incidental finding   3. Gross hematuria

## 2020-04-25 LAB — URINE CULTURE
MICRO NUMBER:: 11245430
Result:: NO GROWTH
SPECIMEN QUALITY:: ADEQUATE

## 2020-04-29 ENCOUNTER — Telehealth: Payer: Self-pay

## 2020-04-29 ENCOUNTER — Other Ambulatory Visit: Payer: Self-pay | Admitting: Family Medicine

## 2020-04-29 MED ORDER — URIBEL 118 MG PO CAPS: 1.0000 | ORAL_CAPSULE | Freq: Four times a day (QID) | ORAL | 0 refills | Status: DC

## 2020-04-29 NOTE — Telephone Encounter (Signed)
Spoke with patient per Dr. Ancil Boozer and informed her that the urine culture was normal and that Uribel was sent in to her pharmacy to be used for discomfort.  Patient verbalized understanding.

## 2020-04-29 NOTE — Telephone Encounter (Signed)
Copied from [place] [id number]. Topic: General - Call Back - No Documentation >> Apr 26, 2020  3:53 PM Erick Blinks wrote: Reason for CRM: Pt is requesting to receive Uribel for her UTI, she says this was prescribed by a previous doctor and says this worked the best.   Best contact: [phone]

## 2020-05-06 ENCOUNTER — Encounter: Payer: Self-pay | Admitting: Family Medicine

## 2020-05-06 ENCOUNTER — Other Ambulatory Visit: Payer: Self-pay

## 2020-05-06 ENCOUNTER — Ambulatory Visit (INDEPENDENT_AMBULATORY_CARE_PROVIDER_SITE_OTHER): Payer: Federal, State, Local not specified - PPO | Admitting: Family Medicine

## 2020-05-06 VITALS — BP 122/88 | HR 91 | Temp 98.0°F | Resp 16 | Ht 65.0 in | Wt 182.7 lb

## 2020-05-06 DIAGNOSIS — R110 Nausea: Secondary | ICD-10-CM

## 2020-05-06 DIAGNOSIS — M25512 Pain in left shoulder: Secondary | ICD-10-CM | POA: Diagnosis not present

## 2020-05-06 DIAGNOSIS — R7303 Prediabetes: Secondary | ICD-10-CM

## 2020-05-06 DIAGNOSIS — G8929 Other chronic pain: Secondary | ICD-10-CM

## 2020-05-06 DIAGNOSIS — E559 Vitamin D deficiency, unspecified: Secondary | ICD-10-CM

## 2020-05-06 DIAGNOSIS — F339 Major depressive disorder, recurrent, unspecified: Secondary | ICD-10-CM

## 2020-05-06 DIAGNOSIS — M21619 Bunion of unspecified foot: Secondary | ICD-10-CM

## 2020-05-06 DIAGNOSIS — M109 Gout, unspecified: Secondary | ICD-10-CM | POA: Diagnosis not present

## 2020-05-06 DIAGNOSIS — E538 Deficiency of other specified B group vitamins: Secondary | ICD-10-CM

## 2020-05-06 DIAGNOSIS — M25511 Pain in right shoulder: Secondary | ICD-10-CM | POA: Diagnosis not present

## 2020-05-06 DIAGNOSIS — F411 Generalized anxiety disorder: Secondary | ICD-10-CM

## 2020-05-06 DIAGNOSIS — R102 Pelvic and perineal pain: Secondary | ICD-10-CM

## 2020-05-06 DIAGNOSIS — E8881 Metabolic syndrome: Secondary | ICD-10-CM

## 2020-05-06 DIAGNOSIS — I10 Essential (primary) hypertension: Secondary | ICD-10-CM

## 2020-05-06 DIAGNOSIS — G4700 Insomnia, unspecified: Secondary | ICD-10-CM

## 2020-05-06 MED ORDER — OZEMPIC (1 MG/DOSE) 2 MG/1.5ML ~~LOC~~ SOPN: 1.0000 mg | PEN_INJECTOR | SUBCUTANEOUS | 1 refills | Status: DC

## 2020-05-06 MED ORDER — PROMETHAZINE HCL 25 MG PO TABS: 12.5000 mg | ORAL_TABLET | Freq: Four times a day (QID) | ORAL | 0 refills | Status: DC | PRN

## 2020-05-06 MED ORDER — HYDROCHLOROTHIAZIDE 12.5 MG PO TABS: 12.5000 mg | ORAL_TABLET | Freq: Every day | ORAL | 1 refills | Status: DC

## 2020-05-06 MED ORDER — METFORMIN HCL ER 750 MG PO TB24: 750.0000 mg | ORAL_TABLET | Freq: Every day | ORAL | 1 refills | Status: DC
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
## 2020-05-07 ENCOUNTER — Other Ambulatory Visit: Payer: Self-pay | Admitting: Family Medicine

## 2020-05-07 LAB — C-REACTIVE PROTEIN: CRP: 0.2 mg/L (ref <8.0)

## 2020-05-07 LAB — PTH, INTACT AND CALCIUM
Calcium: 9.7 mg/dL (ref 8.6–10.2)
PTH: 61 pg/mL (ref 14–64)

## 2020-05-07 LAB — URIC ACID: Uric Acid, Serum: 2.5 mg/dL (ref 2.5–7.0)

## 2020-05-07 LAB — SEDIMENTATION RATE: Sed Rate: 2 mm/h (ref 0–20)

## 2020-06-06 ENCOUNTER — Telehealth: Payer: Federal, State, Local not specified - PPO | Admitting: Emergency Medicine

## 2020-06-06 ENCOUNTER — Telehealth: Payer: Self-pay | Admitting: Family Medicine

## 2020-06-06 DIAGNOSIS — Z20822 Contact with and (suspected) exposure to covid-19: Secondary | ICD-10-CM | POA: Diagnosis not present

## 2020-06-06 DIAGNOSIS — B349 Viral infection, unspecified: Secondary | ICD-10-CM

## 2020-06-06 MED ORDER — ONDANSETRON 4 MG PO TBDP: 4.0000 mg | ORAL_TABLET | Freq: Three times a day (TID) | ORAL | 0 refills | Status: DC | PRN

## 2020-06-06 MED ORDER — BENZONATATE 100 MG PO CAPS: 100.0000 mg | ORAL_CAPSULE | Freq: Two times a day (BID) | ORAL | 0 refills | Status: DC | PRN

## 2020-06-06 NOTE — Telephone Encounter (Signed)
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  Pt has been symptomatic since Sat.  Pt is still trying (having) to work.  Pt did a covid test today, but requesting virtual visit asap.  Pt is concerned she may have bronchitis or pna.  Can you get her worked in?

## 2020-06-06 NOTE — Telephone Encounter (Signed)
lvm informing pt that we are completely booked for today and tomorrow. She does have the option of going to urgent care or logging into [facility] and doing a e-visit.

## 2020-06-06 NOTE — Progress Notes (Signed)
E-Visit for Corona Virus Screening  Your current symptoms are highly consistent with the coronavirus.    You should be tested.  If however your test is negative, you can also try the treatments listed below.  -Tessalon perles for cough -Tylenol for fever and body aches -Zofran for nausea  I've sent prescriptions to your pharmacy for the cough and nausea meds.  Many health care providers can now test patients at their office but not all are.   has multiple testing sites. For information on our [place] testing locations and hours go to [email or web address]  We are enrolling you in our [place] for [place] . Daily you will receive a questionnaire within the [place] website. Our COVID 19 response team will be monitoring your responses daily.  Testing Information: The COVID-19 Community Testing sites are testing BY APPOINTMENT ONLY.  You can schedule online at [email or web address]  If you do not have access to a smart phone or computer you may call [phone] for an appointment.   Additional testing sites in the Community:  . For [facility] Testing sites in [facility]  [email or web address]  . For Pop-up testing sites in [place]  [email or web address]  . For [facility] [email or web address]  . For [facility] testing in [place] and [employer] [email or web address]  . For [facility] testing in [facility]   [email or web address]  For  more information about community testing call [phone]   Please quarantine yourself while awaiting your test results. Please stay home for a minimum of 10 days from the first day of illness with  improving symptoms and you have had 24 hours of no fever (without the use of Tylenol (Acetaminophen) Motrin (Ibuprofen) or any fever reducing medication).  Also - Do not get tested prior to returning to work because once you have had a positive test the test can stay positive for more than a month in some cases.   You should wear a mask or cloth face covering over your nose and mouth if you must be around other people or animals, including pets (even at home). Try to stay at least 6 feet away from other people. This will protect the people around you.  Please continue good preventive care measures, including:  frequent hand-washing, avoid touching your face, cover coughs/sneezes, stay out of crowds and keep a 6 foot distance from others.  COVID-19 is a respiratory illness with symptoms that are similar to the flu. Symptoms are typically mild to moderate, but there have been cases of severe illness and death due to the virus.   The following symptoms may appear 2-14 days after exposure: . Fever . Cough . Shortness of breath or difficulty breathing . Chills . Repeated shaking with chills . Muscle pain . Headache . Sore throat . New loss of taste or smell . Fatigue . Congestion or runny nose . Nausea or vomiting . Diarrhea  Go to the nearest hospital ED for assessment if fever/cough/breathlessness are severe or illness seems like a threat to life.  It is vitally important that if you feel that you have an infection such as this virus or any other virus that you stay home and away from places where you may spread it to others.  You should avoid contact with people age 15 and older.     You may also take acetaminophen (Tylenol) as needed for fever.  Reduce your risk of any infection by using the same precautions used for avoiding the common cold or flu:  .  Wash your hands often with soap and warm water for at least 20 seconds.  If soap and water are not readily available, use an alcohol-based  hand sanitizer with at least 60% alcohol.  . If coughing or sneezing, cover your mouth and nose by coughing or sneezing into the elbow areas of your shirt or coat, into a tissue or into your sleeve (not your hands). . Avoid shaking hands with others and consider head nods or verbal greetings only. . Avoid touching your eyes, nose, or mouth with unwashed hands.  . Avoid close contact with people who are sick. . Avoid places or events with large numbers of people in one location, like concerts or sporting events. . Carefully consider travel plans you have or are making. . If you are planning any travel outside or inside the [place], visit the CDC's Travelers' Health webpage for the latest health notices. . If you have some symptoms but not all symptoms, continue to monitor at home and seek medical attention if your symptoms worsen. . If you are having a medical emergency, call 911.  HOME CARE . Only take medications as instructed by your medical team. . Drink plenty of fluids and get plenty of rest. . A steam or ultrasonic humidifier can help if you have congestion.   GET HELP RIGHT AWAY IF YOU HAVE EMERGENCY WARNING SIGNS** FOR COVID-19. If you or someone is showing any of these signs seek emergency medical care immediately. Call 911 or proceed to your closest emergency facility if: . You develop worsening high fever. . Trouble breathing . Bluish lips or face . Persistent pain or pressure in the chest . New confusion . Inability to wake or stay awake . You cough up blood. . Your symptoms become more severe  **This list is not all possible symptoms. Contact your medical provider for any symptoms that are sever or concerning to you.  MAKE SURE YOU   Understand these instructions.  Will watch your condition.  Will get help right away if you are not doing well or get worse.  Your e-visit answers were reviewed by a board certified advanced clinical practitioner to complete your personal care  plan.  Depending on the condition, your plan could have included both over the counter or prescription medications.  If there is a problem please reply once you have received a response from your provider.  Your safety is important to [place].  If you have drug allergies check your prescription carefully.    You can use MyChart to ask questions about today's visit, request a non-urgent call back, or ask for a work or school excuse for 24 hours related to this e-Visit. If it has been greater than 24 hours you will need to follow up with your provider, or enter a new e-Visit to address those concerns. You will get an e-mail in the next two days asking about your experience.  I hope that your e-visit has been valuable and will speed your recovery. Thank you for using e-visits.   Approximately 5 minutes was used in reviewing the patient's chart, questionnaire, prescribing medications, and documentation.

## 2020-06-14 DIAGNOSIS — N83202 Unspecified ovarian cyst, left side: Secondary | ICD-10-CM | POA: Diagnosis not present

## 2020-07-08 ENCOUNTER — Other Ambulatory Visit: Payer: Self-pay

## 2020-07-08 ENCOUNTER — Ambulatory Visit
Payer: Federal, State, Local not specified - PPO | Attending: Student in an Organized Health Care Education/Training Program | Admitting: Student in an Organized Health Care Education/Training Program

## 2020-07-08 ENCOUNTER — Encounter: Payer: Self-pay | Admitting: Student in an Organized Health Care Education/Training Program

## 2020-07-08 VITALS — BP 119/81 | HR 90 | Temp 97.2°F | Resp 16 | Ht 66.0 in | Wt 190.0 lb

## 2020-07-08 DIAGNOSIS — E8881 Metabolic syndrome: Secondary | ICD-10-CM | POA: Diagnosis not present

## 2020-07-08 DIAGNOSIS — N809 Endometriosis, unspecified: Secondary | ICD-10-CM | POA: Insufficient documentation

## 2020-07-08 DIAGNOSIS — G894 Chronic pain syndrome: Secondary | ICD-10-CM

## 2020-07-08 DIAGNOSIS — R102 Pelvic and perineal pain: Secondary | ICD-10-CM | POA: Insufficient documentation

## 2020-07-08 DIAGNOSIS — G8929 Other chronic pain: Secondary | ICD-10-CM | POA: Insufficient documentation

## 2020-07-08 DIAGNOSIS — M5412 Radiculopathy, cervical region: Secondary | ICD-10-CM

## 2020-07-08 MED ORDER — TAPENTADOL HCL 75 MG PO TABS: 75.0000 mg | ORAL_TABLET | Freq: Two times a day (BID) | ORAL | 0 refills | Status: AC | PRN

## 2020-07-08 MED ORDER — TAPENTADOL HCL 75 MG PO TABS: 75.0000 mg | ORAL_TABLET | Freq: Two times a day (BID) | ORAL | 0 refills | Status: DC | PRN

## 2020-07-08 NOTE — Progress Notes (Signed)
PROVIDER NOTE: Information contained herein reflects review and annotations entered in association with encounter. Interpretation of such information and data should be left to medically-trained personnel. Information provided to patient can be located elsewhere in the medical record under "Patient Instructions". Document created using STT-dictation technology, any transcriptional errors that may result from process are unintentional.    Patient: Stacey Stanley  Service Category: E/M  Provider: [name], MD  DOB: 1976-01-27  DOS: 07/08/2020  Specialty: Interventional Pain Management  MRN: [id number]  Setting: Ambulatory outpatient  PCP: Steele Sizer, MD  Type: Established Patient    Referring Provider: Steele Sizer, MD  Location: Office  Delivery: Face-to-face     HPI  [name], a 45 y.o. year old female, is here today because of her Pelvic pain [R10.2]. Ms. Vipond primary complain today is Pelvic Pain Last encounter: My last encounter with her was on 04/11/2020. Pertinent problems: Ms. Miranda has Endometriosis; Chronic pelvic pain in female; Cervical radiculitis; Pelvic pain; Chronic pain syndrome; and Opiate use on their pertinent problem list. Pain Assessment: Severity of Chronic pain is reported as a 6 /10. Location: Pelvis Anterior,Left/Pain radiates from groin area to left side. Onset: More than a month ago. Quality: Constant,Sharp. Timing: Constant. Modifying factor(s): Rest and pain medication. Vitals:  height is _0  (1.676 m) and weight is 190 lb (86.2 kg). Her temporal temperature is 97.2 F (36.2 C) (abnormal). Her blood pressure is 119/81 and her pulse is 90. Her respiration is 16 and oxygen saturation is 100%.   Reason for encounter: medication management.    No change in medical history since last visit.  Patient's pain is at baseline.  Patient continues pain regimen as prescribed.  States that it provides pain relief and improvement in functional  status.   Pharmacotherapy Assessment   06/25/2020  04/11/2020   1  Nucynta 75 Mg Tablet  45.00  22  Bi Lat  [id number]  Wal (5343)  0/0  61.36 MME  Comm Ins  [place]      Analgesic: Nucynta 75 mg twice daily as needed, quantity 45/month; MME equals 45   Monitoring: [place] PMP: PDMP reviewed during this encounter.       Pharmacotherapy: No side-effects or adverse reactions reported. Compliance: No problems identified. Effectiveness: Clinically acceptable.  Janne Napoleon, RN  07/08/2020  2:56 PM  Sign when Signing Visit Safety precautions to be maintained throughout the outpatient stay will include: orient to surroundings, keep bed in low position, maintain call bell within reach at all times, provide assistance with transfer out of bed and ambulation.   Nursing Pain Medication Assessment:  Safety precautions to be maintained throughout the outpatient stay will include: orient to surroundings, keep bed in low position, maintain call bell within reach at all times, provide assistance with transfer out of bed and ambulation.  Medication Inspection Compliance: Pill count conducted under aseptic conditions, in front of the patient. Neither the pills nor the bottle was removed from the patient's sight at any time. Once count was completed pills were immediately returned to the patient in their original bottle.  Medication: Tapentadol (Nucynta) Pill/Patch Count: 27 of 45 pills remain Pill/Patch Appearance: Markings consistent with prescribed medication Bottle Appearance: Standard pharmacy container. Clearly labeled. Filled Date: 1 / 24 / 2022 Last Medication intake:  Today    UDS:  Summary  Date Value Ref Range Status  01/25/2020 Note  Final    Comment:    ==================================================================== [employer] Select 13 (MW) ====================================================================  Test                             Result       Flag       Units  Drug Present  and Declared for Prescription Verification   Tapentadol                     372          EXPECTED   ng/mg creat    Source of tapentadol is a scheduled prescription medication.  ==================================================================== Test                      Result    Flag   Units      Ref Range   Creatinine              141              mg/dL      >=20 ==================================================================== Declared Medications:  The flagging and interpretation on this report are based on the  following declared medications.  Unexpected results may arise from  inaccuracies in the declared medications.   **Note: The testing scope of this panel includes these medications:   Tapentadol (Nucynta)   **Note: The testing scope of this panel does not include the  following reported medications:   Acetaminophen (Tylenol)  Aspirin  Dapsone  Fish Oil  Hydrochlorothiazide  Ibuprofen (Advil)  Imiquimod (Aldara)  Metformin  Plecanatide (Trulance)  Promethazine (Phenergan)  Semaglutide (Ozempic)  Topical  Trazodone (Desyrel)  Vitamin D ==================================================================== For clinical consultation, please call [phone]. ====================================================================      ROS  Constitutional: Denies any fever or chills Gastrointestinal: No reported hemesis, hematochezia, vomiting, or acute GI distress Musculoskeletal: Denies any acute onset joint swelling, redness, loss of ROM, or weakness Neurological: No reported episodes of acute onset apraxia, aphasia, dysarthria, agnosia, amnesia, paralysis, loss of coordination, or loss of consciousness  Medication Review  Dapsone, Fish Oil, Fluocin-Hydroquinone-Tretinoin, Semaglutide (1 MG/DOSE), Uribel, aspirin EC, benzonatate, hydrochlorothiazide, ibuprofen, imiquimod, metFORMIN, ondansetron, promethazine, and tapentadol HCl  History Review  Allergy: Ms.  Gilkison is allergic to nuvigil [armodafinil]. Drug: Ms. Hellums  reports no history of drug use. Alcohol:  reports no history of alcohol use. Tobacco:  reports that she has been smoking cigarettes. She started smoking about 26 years ago. She has a 12.50 pack-year smoking history. She has never used smokeless tobacco. Social: Ms. Huyett  reports that she has been smoking cigarettes. She started smoking about 26 years ago. She has a 12.50 pack-year smoking history. She has never used smokeless tobacco. She reports that she does not drink alcohol and does not use drugs. Medical:  has a past medical history of Anxiety, Arthritis, Chronic insomnia, Depression, Diabetes mellitus without complication ([place]), Dysrhythmia, Endometriosis, GERD (gastroesophageal reflux disease), Gout, Headache(784.0), Hypertension, Kidney stones, Metabolic syndrome, Obesity, Serum calcium elevated, Sleep disorder, circadian, shift work type, SVD (spontaneous vaginal delivery), and Tachycardia. Surgical: Ms. Rex  has a past surgical history that includes metatarsil  (2003); Dilation and curettage of uterus; Induced abortion; laparoscopy (1992); Eye surgery (06/28/13); laparoscopy (N/A, 07/19/2013); Hysteroscopy with D & C (N/A, 07/19/2013); Abdominal hysterectomy (05/21/2016); and Hemorroidectomy (04/04/2018). Family: family history includes Alzheimer's disease in her paternal grandfather; Diabetes in her mother; Heart disease in her brother; Hypertension in her father and mother; Kidney disease in her father; Leukemia in her paternal grandmother; Stroke in her  maternal grandmother.  Laboratory Chemistry Profile   Renal Lab Results  Component Value Date   BUN 18 02/07/2020   CREATININE 0.96 97/41/6384   BCR NOT APPLICABLE 53/64/6803   GFRAA 82 02/07/2020   GFRNONAA 71 02/07/2020     Hepatic Lab Results  Component Value Date   AST 12 02/07/2020   ALT 10 02/07/2020   ALBUMIN 4.0 02/18/2019   ALKPHOS 37 (L) 02/18/2019    AMYLASE 102 (H) 05/22/2016   LIPASE 31 02/18/2019     Electrolytes Lab Results  Component Value Date   NA 140 02/07/2020   K 4.3 02/07/2020   CL 105 02/07/2020   CALCIUM 9.7 05/06/2020   MG 1.5 09/15/2017     Bone Lab Results  Component Value Date   VD25OH 34 08/01/2018     Inflammation (CRP: Acute Phase) (ESR: Chronic Phase) Lab Results  Component Value Date   CRP 0.2 05/06/2020   ESRSEDRATE 2 05/06/2020       Note: Above Lab results reviewed.  Recent Imaging Review  CT RENAL STONE STUDY CLINICAL DATA:  Flank pain. Left-sided flank pain.  EXAM: CT ABDOMEN AND PELVIS WITHOUT CONTRAST  TECHNIQUE: Multidetector CT imaging of the abdomen and pelvis was performed following the standard protocol without IV contrast.  COMPARISON:  CT dated 09/09/2017.  FINDINGS: Lower chest: The lung bases are clear. The heart size is normal.  Hepatobiliary: The liver is normal. Normal gallbladder.There is no biliary ductal dilation.  Pancreas: Normal contours without ductal dilatation. No peripancreatic fluid collection.  Spleen: Unremarkable.  Adrenals/Urinary Tract:  --Adrenal glands: Unremarkable.  --Right kidney/ureter: No hydronephrosis or radiopaque kidney stones.  --Left kidney/ureter: There is a nonobstructing stone in the upper pole the left kidney measuring approximately 5 mm.  --Urinary bladder: Unremarkable.  Stomach/Bowel:  --Stomach/Duodenum: No hiatal hernia or other gastric abnormality. Normal duodenal course and caliber.  --Small bowel: Unremarkable.  --Colon: Unremarkable.  --Appendix: Normal.  Vascular/Lymphatic: Normal course and caliber of the major abdominal vessels.  --No retroperitoneal lymphadenopathy.  --No mesenteric lymphadenopathy.  --No pelvic or inguinal lymphadenopathy.  Reproductive: The patient is status post prior hysterectomy. There are few small cysts involving the left ovary.  Other: No ascites or free air. The  abdominal wall is normal.  Musculoskeletal. No acute displaced fractures.  IMPRESSION: 1. No acute abdominopelvic abnormality. 2. Nonobstructive left nephrolithiasis.  Electronically Signed   By: Constance Holster M.D.   On: 11/25/2019 19:47 Note: Reviewed        Physical Exam  General appearance: Well nourished, well developed, and well hydrated. In no apparent acute distress Mental status: Alert, oriented x 3 (person, place, & time)       Respiratory: No evidence of acute respiratory distress Eyes: PERLA Vitals: BP 119/81   Pulse 90   Temp (!) 97.2 F (36.2 C) (Temporal)   Resp 16   Ht _0  (1.676 m)   Wt 190 lb (86.2 kg)   LMP 07/09/2015 (Exact Date)   SpO2 100%   BMI 30.67 kg/m  BMI: Estimated body mass index is 30.67 kg/m as calculated from the following:   Height as of this encounter: _1  (1.676 m).   Weight as of this encounter: 190 lb (86.2 kg). Ideal: Ideal body weight: 59.3 kg (130 lb 11.7 oz) Adjusted ideal body weight: 70.1 kg (154 lb 7 oz)  Assessment   Status Diagnosis  Controlled Controlled Controlled 1. Pelvic pain   2. Chronic pelvic pain in female   3. Endometriosis  4. Cervical radiculitis   5. Dysmetabolic syndrome   6. Chronic pain syndrome        Plan of Care  [name] has a current medication list which includes the following long-term medication(s): hydrochlorothiazide, metformin, and promethazine.  Pharmacotherapy (Medications Ordered): Meds ordered this encounter  Medications  . tapentadol HCl (NUCYNTA) 75 MG tablet    Sig: Take 1 tablet (75 mg total) by mouth 2 (two) times daily as needed.    Dispense:  45 tablet    Refill:  0    Chronic pain syndrome  . tapentadol HCl (NUCYNTA) 75 MG tablet    Sig: Take 1 tablet (75 mg total) by mouth 2 (two) times daily as needed.    Dispense:  45 tablet    Refill:  0    Chronic pain syndrome  . tapentadol HCl (NUCYNTA) 75 MG tablet    Sig: Take 1 tablet (75 mg total)  by mouth 2 (two) times daily as needed.    Dispense:  45 tablet    Refill:  0    Chronic pain syndrome    Follow-up plan:   Return in about 3 months (around 10/05/2020) for Medication Management, in person.   Recent Visits Date Type Provider Dept  04/11/20 Office Visit [name], MD [facility]  Showing recent visits within past 90 days and meeting all other requirements Today's Visits Date Type Provider Dept  07/08/20 Office Visit [name], MD [facility]  Showing today's visits and meeting all other requirements Future Appointments Date Type Provider Dept  10/03/20 Appointment [name], MD [facility]  Showing future appointments within next 90 days and meeting all other requirements  I discussed the assessment and treatment plan with the patient. The patient was provided an opportunity to ask questions and all were answered. The patient agreed with the plan and demonstrated an understanding of the instructions.  Patient advised to call back or seek an in-person evaluation if the symptoms or condition worsens.  Duration of encounter: 30 minutes.  Note by: [name], MD Date: 07/08/2020; Time: 3:18 PM

## 2020-07-08 NOTE — Patient Instructions (Signed)
____________________________________________________________________________________________  Medication Rules  Applies to: All patients receiving prescriptions (written or electronic).  Pharmacy of record: Pharmacy where electronic prescriptions will be sent. If written prescriptions are taken to a different pharmacy, please inform the nursing staff. The pharmacy listed in the electronic medical record should be the one where you would like electronic prescriptions to be sent.  Prescription refills: Only during scheduled appointments. Applies to both, written and electronic prescriptions.  NOTE: The following applies primarily to controlled substances (Opioid* Pain Medications).   Patient's responsibilities: 1. Pain Pills: Bring all pain pills to every appointment (except for procedure appointments). 2. Pill Bottles: Bring pills in original pharmacy bottle. Always bring newest bottle. Bring bottle, even if empty. 3. Medication refills: You are responsible for knowing and keeping track of what medications you need refilled. The day before your appointment, write a list of all prescriptions that need to be refilled. Bring that list to your appointment and give it to the admitting nurse. Prescriptions will be written only during appointments. If you forget a medication, it will not be "Called in", "Faxed", or "electronically sent". You will need to get another appointment to get these prescribed. 4. Prescription Accuracy: You are responsible for carefully inspecting your prescriptions before leaving our office. Have the discharge nurse carefully go over each prescription with you, before taking them home. Make sure that your name is accurately spelled, that your address is correct. Check the name and dose of your medication to make sure it is accurate. Check the number of pills, and the written instructions to make sure they are clear and accurate. Make sure that you are given enough medication to last  until your next medication refill appointment. 5. Taking Medication: Take medication as prescribed. Never take more pills than instructed. Never take medication more frequently than prescribed. Taking less pills or less frequently is permitted and encouraged, when it comes to controlled substances (written prescriptions).  6. Inform other Doctors: Always inform, all of your healthcare providers, of all the medications you take. 7. Pain Medication from other Providers: You are not allowed to accept any additional pain medication from any other Doctor or Healthcare provider. There are two exceptions to this rule. (see below) In the event that you require additional pain medication, you are responsible for notifying us, as stated below. 8. Medication Agreement: You are responsible for carefully reading and following our Medication Agreement. This must be signed before receiving any prescriptions from our practice. Safely store a copy of your signed Agreement. Violations to the Agreement will result in no further prescriptions. (Additional copies of our Medication Agreement are available upon request.) 9. Laws, Rules, & Regulations: All patients are expected to follow all Federal and State Laws, Statutes, Rules, & Regulations. Ignorance of the Laws does not constitute a valid excuse. The use of any illegal substances is prohibited. 10. Adopted CDC guidelines & recommendations: Target dosing levels will be at or below 60 MME/day. Use of benzodiazepines** is not recommended.  Exceptions: There are only two exceptions to the rule of not receiving pain medications from other Healthcare Providers. 1. Exception #1 (Emergencies): In the event of an emergency (i.e.: accident requiring emergency care), you are allowed to receive additional pain medication. However, you are responsible for: As soon as you are able, call our office [phone], at any time of the day or night, and leave a message stating your name, the  date and nature of the emergency, and the name and dose of the medication   prescribed. In the event that your call is answered by a member of our staff, make sure to document and save the date, time, and the name of the person that took your information.  2. Exception #2 (Planned Surgery): In the event that you are scheduled by another doctor or dentist to have any type of surgery or procedure, you are allowed (for a period no longer than 30 days), to receive additional pain medication, for the acute post-op pain. However, in this case, you are responsible for picking up a copy of our "Post-op Pain Management for Surgeons" handout, and giving it to your surgeon or dentist. This document is available at our office, and does not require an appointment to obtain it. Simply go to our office during business hours (Monday-Thursday from 8:00 AM to 4:00 PM) (Friday 8:00 AM to 12:00 Noon) or if you have a scheduled appointment with us, prior to your surgery, and ask for it by name. In addition, you will need to provide us with your name, name of your surgeon, type of surgery, and date of procedure or surgery.  *Opioid medications include: morphine, codeine, oxycodone, oxymorphone, hydrocodone, hydromorphone, meperidine, tramadol, tapentadol, buprenorphine, fentanyl, methadone. **Benzodiazepine medications include: diazepam (Valium), alprazolam (Xanax), clonazepam (Klonopine), lorazepam (Ativan), clorazepate (Tranxene), chlordiazepoxide (Librium), estazolam (Prosom), oxazepam (Serax), temazepam (Restoril), triazolam (Halcion) (Last updated: 07/29/2017) ____________________________________________________________________________________________    

## 2020-07-08 NOTE — Progress Notes (Signed)
Safety precautions to be maintained throughout the outpatient stay will include: orient to surroundings, keep bed in low position, maintain call bell within reach at all times, provide assistance with transfer out of bed and ambulation.   Nursing Pain Medication Assessment:  Safety precautions to be maintained throughout the outpatient stay will include: orient to surroundings, keep bed in low position, maintain call bell within reach at all times, provide assistance with transfer out of bed and ambulation.  Medication Inspection Compliance: Pill count conducted under aseptic conditions, in front of the patient. Neither the pills nor the bottle was removed from the patient's sight at any time. Once count was completed pills were immediately returned to the patient in their original bottle.  Medication: Tapentadol (Nucynta) Pill/Patch Count: 27 of 45 pills remain Pill/Patch Appearance: Markings consistent with prescribed medication Bottle Appearance: Standard pharmacy container. Clearly labeled. Filled Date: 1 / 24 / 2022 Last Medication intake:  Today

## 2020-07-25 ENCOUNTER — Telehealth: Payer: Self-pay | Admitting: Student in an Organized Health Care Education/Training Program

## 2020-07-25 NOTE — Telephone Encounter (Signed)
Pt needs Pre-Authorization for Med NUCYNTA 75 MG, Needs ASAP she ran out of her medicine today and she states she never needed pre authorization before please send the form to [address] [place] [address]

## 2020-07-25 NOTE — Telephone Encounter (Signed)
Called pharmacy and they tried to run it before the 30 day window. Patient was able to get the med today. NO PA needed.

## 2020-10-01 ENCOUNTER — Ambulatory Visit
Payer: Federal, State, Local not specified - PPO | Attending: Student in an Organized Health Care Education/Training Program | Admitting: Student in an Organized Health Care Education/Training Program

## 2020-10-01 ENCOUNTER — Encounter: Payer: Self-pay | Admitting: Student in an Organized Health Care Education/Training Program

## 2020-10-01 ENCOUNTER — Other Ambulatory Visit: Payer: Self-pay

## 2020-10-01 ENCOUNTER — Telehealth: Payer: Self-pay | Admitting: *Deleted

## 2020-10-01 VITALS — BP 118/86 | HR 83 | Temp 97.0°F | Resp 16 | Ht 66.0 in | Wt 175.0 lb

## 2020-10-01 DIAGNOSIS — N809 Endometriosis, unspecified: Secondary | ICD-10-CM | POA: Insufficient documentation

## 2020-10-01 DIAGNOSIS — G894 Chronic pain syndrome: Secondary | ICD-10-CM | POA: Diagnosis not present

## 2020-10-01 DIAGNOSIS — G8929 Other chronic pain: Secondary | ICD-10-CM | POA: Diagnosis not present

## 2020-10-01 DIAGNOSIS — M5412 Radiculopathy, cervical region: Secondary | ICD-10-CM | POA: Insufficient documentation

## 2020-10-01 DIAGNOSIS — R102 Pelvic and perineal pain: Secondary | ICD-10-CM | POA: Insufficient documentation

## 2020-10-01 DIAGNOSIS — E8881 Metabolic syndrome: Secondary | ICD-10-CM | POA: Insufficient documentation

## 2020-10-01 MED ORDER — TAPENTADOL HCL 75 MG PO TABS: 75.0000 mg | ORAL_TABLET | Freq: Two times a day (BID) | ORAL | 0 refills | Status: AC | PRN

## 2020-10-01 MED ORDER — TAPENTADOL HCL 75 MG PO TABS: 75.0000 mg | ORAL_TABLET | Freq: Two times a day (BID) | ORAL | 0 refills | Status: DC | PRN

## 2020-10-01 NOTE — Progress Notes (Signed)
PROVIDER NOTE: Information contained herein reflects review and annotations entered in association with encounter. Interpretation of such information and data should be left to medically-trained personnel. Information provided to patient can be located elsewhere in the medical record under "Patient Instructions". Document created using STT-dictation technology, any transcriptional errors that may result from process are unintentional.    Patient: Stacey Stanley  Service Category: E/M  Provider: [name], MD  DOB: 01-13-1976  DOS: 10/01/2020  Specialty: Interventional Pain Management  MRN: [id number]  Setting: Ambulatory outpatient  PCP: Steele Sizer, MD  Type: Established Patient    Referring Provider: Steele Sizer, MD  Location: Office  Delivery: Face-to-face     HPI  [name], a 45 y.o. year old female, is here today because of her Pelvic pain [R10.2]. Ms. Saathoff primary complain today is Hip Pain Last encounter: My last encounter with her was on 07/25/2020. Pertinent problems: Ms. Weightman has Endometriosis; Chronic pelvic pain in female; Cervical radiculitis; Pelvic pain; Chronic pain syndrome; and Opiate use on their pertinent problem list. Pain Assessment: Severity of Chronic pain is reported as a 6 /10. Location: Pelvis  /to left flank. Onset: More than a month ago. Quality: Aching,Sharp. Timing: Intermittent. Modifying factor(s): meds. Vitals:  height is '5\' 6"'  (1.676 m) and weight is 175 lb (79.4 kg). Her temporal temperature is 97 F (36.1 C) (abnormal). Her blood pressure is 118/86 and her pulse is 83. Her respiration is 16 and oxygen saturation is 100%.   Reason for encounter: medication management.    No change in medical history since last visit.  Patient's pain is at baseline.  Patient continues multimodal pain regimen as prescribed.  States that it provides pain relief and improvement in functional status.   Pharmacotherapy Assessment   Analgesic: Nucynta  75 mg twice daily as needed, quantity 45/month; MME equals 45   Monitoring: [place] PMP: PDMP reviewed during this encounter.       Pharmacotherapy: No side-effects or adverse reactions reported. Compliance: No problems identified. Effectiveness: Clinically acceptable.  Rise Patience, RN  10/01/2020  1:38 PM  Sign when Signing Visit Nursing Pain Medication Assessment:  Safety precautions to be maintained throughout the outpatient stay will include: orient to surroundings, keep bed in low position, maintain call bell within reach at all times, provide assistance with transfer out of bed and ambulation.  Medication Inspection Compliance: Pill count conducted under aseptic conditions, in front of the patient. Neither the pills nor the bottle was removed from the patient's sight at any time. Once count was completed pills were immediately returned to the patient in their original bottle.  Medication: Tapentadol (Nucynta) Pill/Patch Count: 38 of 45 pills remain Pill/Patch Appearance: Markings consistent with prescribed medication Bottle Appearance: Standard pharmacy container. Clearly labeled. Filled Date: 4 / 24 / 2022 Last Medication intake:  Yesterday    UDS:  Summary  Date Value Ref Range Status  01/25/2020 Note  Final    Comment:    ==================================================================== [employer] Select 13 (MW) ==================================================================== Test                             Result       Flag       Units  Drug Present and Declared for Prescription Verification   Tapentadol                     372  EXPECTED   ng/mg creat    Source of tapentadol is a scheduled prescription medication.  ==================================================================== Test                      Result    Flag   Units      Ref Range   Creatinine              141              mg/dL       >=20 ==================================================================== Declared Medications:  The flagging and interpretation on this report are based on the  following declared medications.  Unexpected results may arise from  inaccuracies in the declared medications.   **Note: The testing scope of this panel includes these medications:   Tapentadol (Nucynta)   **Note: The testing scope of this panel does not include the  following reported medications:   Acetaminophen (Tylenol)  Aspirin  Dapsone  Fish Oil  Hydrochlorothiazide  Ibuprofen (Advil)  Imiquimod (Aldara)  Metformin  Plecanatide (Trulance)  Promethazine (Phenergan)  Semaglutide (Ozempic)  Topical  Trazodone (Desyrel)  Vitamin D ==================================================================== For clinical consultation, please call [phone]. ====================================================================      ROS  Constitutional: Denies any fever or chills Gastrointestinal: No reported hemesis, hematochezia, vomiting, or acute GI distress Musculoskeletal: Denies any acute onset joint swelling, redness, loss of ROM, or weakness Neurological: No reported episodes of acute onset apraxia, aphasia, dysarthria, agnosia, amnesia, paralysis, loss of coordination, or loss of consciousness  Medication Review  Dapsone, Fish Oil, Fluocin-Hydroquinone-Tretinoin, Semaglutide (1 MG/DOSE), Uribel, aspirin EC, benzonatate, hydrochlorothiazide, ibuprofen, imiquimod, metFORMIN, ondansetron, promethazine, and tapentadol HCl  History Review  Allergy: Ms. Poehler is allergic to nuvigil [armodafinil]. Drug: Ms. Lundeen  reports no history of drug use. Alcohol:  reports no history of alcohol use. Tobacco:  reports that she has been smoking cigarettes. She started smoking about 26 years ago. She has a 12.50 pack-year smoking history. She has never used smokeless tobacco. Social: Ms. Glynn  reports that she has been  smoking cigarettes. She started smoking about 26 years ago. She has a 12.50 pack-year smoking history. She has never used smokeless tobacco. She reports that she does not drink alcohol and does not use drugs. Medical:  has a past medical history of Anxiety, Arthritis, Chronic insomnia, Depression, Diabetes mellitus without complication ([place]), Dysrhythmia, Endometriosis, GERD (gastroesophageal reflux disease), Gout, Headache(784.0), Hypertension, Kidney stones, Metabolic syndrome, Obesity, Serum calcium elevated, Sleep disorder, circadian, shift work type, SVD (spontaneous vaginal delivery), and Tachycardia. Surgical: Ms. Mariotti  has a past surgical history that includes metatarsil  (2003); Dilation and curettage of uterus; Induced abortion; laparoscopy (1992); Eye surgery (06/28/13); laparoscopy (N/A, 07/19/2013); Hysteroscopy with D & C (N/A, 07/19/2013); Abdominal hysterectomy (05/21/2016); and Hemorroidectomy (04/04/2018). Family: family history includes Alzheimer's disease in her paternal grandfather; Diabetes in her mother; Heart disease in her brother; Hypertension in her father and mother; Kidney disease in her father; Leukemia in her paternal grandmother; Stroke in her maternal grandmother.  Laboratory Chemistry Profile   Renal Lab Results  Component Value Date   BUN 18 02/07/2020   CREATININE 0.96 05/20/7587   BCR NOT APPLICABLE 32/54/9826   GFRAA 82 02/07/2020   GFRNONAA 71 02/07/2020     Hepatic Lab Results  Component Value Date   AST 12 02/07/2020   ALT 10 02/07/2020   ALBUMIN 4.0 02/18/2019   ALKPHOS 37 (L) 02/18/2019   AMYLASE 102 (H) 05/22/2016   LIPASE 31 02/18/2019  Electrolytes Lab Results  Component Value Date   NA 140 02/07/2020   K 4.3 02/07/2020   CL 105 02/07/2020   CALCIUM 9.7 05/06/2020   MG 1.5 09/15/2017     Bone Lab Results  Component Value Date   VD25OH 34 08/01/2018     Inflammation (CRP: Acute Phase) (ESR: Chronic Phase) Lab Results   Component Value Date   CRP 0.2 05/06/2020   ESRSEDRATE 2 05/06/2020       Note: Above Lab results reviewed.  Recent Imaging Review  CT RENAL STONE STUDY CLINICAL DATA:  Flank pain. Left-sided flank pain.  EXAM: CT ABDOMEN AND PELVIS WITHOUT CONTRAST  TECHNIQUE: Multidetector CT imaging of the abdomen and pelvis was performed following the standard protocol without IV contrast.  COMPARISON:  CT dated 09/09/2017.  FINDINGS: Lower chest: The lung bases are clear. The heart size is normal.  Hepatobiliary: The liver is normal. Normal gallbladder.There is no biliary ductal dilation.  Pancreas: Normal contours without ductal dilatation. No peripancreatic fluid collection.  Spleen: Unremarkable.  Adrenals/Urinary Tract:  --Adrenal glands: Unremarkable.  --Right kidney/ureter: No hydronephrosis or radiopaque kidney stones.  --Left kidney/ureter: There is a nonobstructing stone in the upper pole the left kidney measuring approximately 5 mm.  --Urinary bladder: Unremarkable.  Stomach/Bowel:  --Stomach/Duodenum: No hiatal hernia or other gastric abnormality. Normal duodenal course and caliber.  --Small bowel: Unremarkable.  --Colon: Unremarkable.  --Appendix: Normal.  Vascular/Lymphatic: Normal course and caliber of the major abdominal vessels.  --No retroperitoneal lymphadenopathy.  --No mesenteric lymphadenopathy.  --No pelvic or inguinal lymphadenopathy.  Reproductive: The patient is status post prior hysterectomy. There are few small cysts involving the left ovary.  Other: No ascites or free air. The abdominal wall is normal.  Musculoskeletal. No acute displaced fractures.  IMPRESSION: 1. No acute abdominopelvic abnormality. 2. Nonobstructive left nephrolithiasis.  Electronically Signed   By: Constance Holster M.D.   On: 11/25/2019 19:47 Note: Reviewed        Physical Exam  General appearance: Well nourished, well developed, and well  hydrated. In no apparent acute distress Mental status: Alert, oriented x 3 (person, place, & time)       Respiratory: No evidence of acute respiratory distress Eyes: PERLA Vitals: BP 118/86   Pulse 83   Temp (!) 97 F (36.1 C) (Temporal)   Resp 16   Ht '5\' 6"'  (1.676 m)   Wt 175 lb (79.4 kg)   LMP 07/09/2015 (Exact Date)   SpO2 100%   BMI 28.25 kg/m  BMI: Estimated body mass index is 28.25 kg/m as calculated from the following:   Height as of this encounter: '5\' 6"'  (1.676 m).   Weight as of this encounter: 175 lb (79.4 kg). Ideal: Ideal body weight: 59.3 kg (130 lb 11.7 oz) Adjusted ideal body weight: 67.3 kg (148 lb 7 oz)  5 out of 5 strength bilateral lower extremity: Plantar flexion, dorsiflexion, knee flexion, knee extension.   Assessment   Status Diagnosis  Controlled Controlled Controlled 1. Pelvic pain   2. Chronic pelvic pain in female   3. Endometriosis   4. Cervical radiculitis   5. Dysmetabolic syndrome   6. Chronic pain syndrome      Plan of Care   [name] has a current medication list which includes the following long-term medication(s): hydrochlorothiazide, metformin, and promethazine.  Pharmacotherapy (Medications Ordered): Meds ordered this encounter  Medications  . tapentadol HCl (NUCYNTA) 75 MG tablet    Sig: Take 1 tablet (75  mg total) by mouth 2 (two) times daily as needed.    Dispense:  45 tablet    Refill:  0    Chronic pain syndrome  . tapentadol HCl (NUCYNTA) 75 MG tablet    Sig: Take 1 tablet (75 mg total) by mouth 2 (two) times daily as needed.    Dispense:  45 tablet    Refill:  0    Chronic pain syndrome  . tapentadol HCl (NUCYNTA) 75 MG tablet    Sig: Take 1 tablet (75 mg total) by mouth 2 (two) times daily as needed.    Dispense:  45 tablet    Refill:  0    Chronic pain syndrome   Follow-up plan:   Return in about 3 months (around 01/01/2021) for Medication Management, in person.   Recent Visits Date Type Provider  Dept  07/08/20 Office Visit [name], MD [facility]  Showing recent visits within past 90 days and meeting all other requirements Today's Visits Date Type Provider Dept  10/01/20 Office Visit [name], MD [facility]  Showing today's visits and meeting all other requirements Future Appointments No visits were found meeting these conditions. Showing future appointments within next 90 days and meeting all other requirements  I discussed the assessment and treatment plan with the patient. The patient was provided an opportunity to ask questions and all were answered. The patient agreed with the plan and demonstrated an understanding of the instructions.  Patient advised to call back or seek an in-person evaluation if the symptoms or condition worsens.  Duration of encounter: 30 minutes.  Note by: [name], MD Date: 10/01/2020; Time: 1:50 PM

## 2020-10-01 NOTE — Progress Notes (Signed)
Nursing Pain Medication Assessment:  Safety precautions to be maintained throughout the outpatient stay will include: orient to surroundings, keep bed in low position, maintain call bell within reach at all times, provide assistance with transfer out of bed and ambulation.  Medication Inspection Compliance: Pill count conducted under aseptic conditions, in front of the patient. Neither the pills nor the bottle was removed from the patient's sight at any time. Once count was completed pills were immediately returned to the patient in their original bottle.  Medication: Tapentadol (Nucynta) Pill/Patch Count: 38 of 45 pills remain Pill/Patch Appearance: Markings consistent with prescribed medication Bottle Appearance: Standard pharmacy container. Clearly labeled. Filled Date: 4 / 24 / 2022 Last Medication intake:  Yesterday

## 2020-10-01 NOTE — Telephone Encounter (Signed)
Patient notified FMLA completed.

## 2020-10-03 ENCOUNTER — Encounter
Payer: Federal, State, Local not specified - PPO | Admitting: Student in an Organized Health Care Education/Training Program

## 2020-11-04 ENCOUNTER — Ambulatory Visit: Payer: Federal, State, Local not specified - PPO | Admitting: Family Medicine

## 2020-11-11 ENCOUNTER — Other Ambulatory Visit: Payer: Self-pay

## 2020-11-11 ENCOUNTER — Encounter: Payer: Self-pay | Admitting: Family Medicine

## 2020-11-11 ENCOUNTER — Ambulatory Visit: Payer: Federal, State, Local not specified - PPO | Admitting: Family Medicine

## 2020-11-11 VITALS — BP 118/84 | HR 97 | Temp 98.2°F | Resp 16 | Ht 66.0 in | Wt 177.4 lb

## 2020-11-11 DIAGNOSIS — E538 Deficiency of other specified B group vitamins: Secondary | ICD-10-CM | POA: Diagnosis not present

## 2020-11-11 DIAGNOSIS — Z79899 Other long term (current) drug therapy: Secondary | ICD-10-CM | POA: Insufficient documentation

## 2020-11-11 DIAGNOSIS — R142 Eructation: Secondary | ICD-10-CM | POA: Insufficient documentation

## 2020-11-11 DIAGNOSIS — R634 Abnormal weight loss: Secondary | ICD-10-CM | POA: Insufficient documentation

## 2020-11-11 DIAGNOSIS — K5900 Constipation, unspecified: Secondary | ICD-10-CM | POA: Insufficient documentation

## 2020-11-11 DIAGNOSIS — Z1211 Encounter for screening for malignant neoplasm of colon: Secondary | ICD-10-CM | POA: Insufficient documentation

## 2020-11-11 DIAGNOSIS — R7303 Prediabetes: Secondary | ICD-10-CM | POA: Diagnosis not present

## 2020-11-11 DIAGNOSIS — F325 Major depressive disorder, single episode, in full remission: Secondary | ICD-10-CM

## 2020-11-11 DIAGNOSIS — K581 Irritable bowel syndrome with constipation: Secondary | ICD-10-CM | POA: Insufficient documentation

## 2020-11-11 DIAGNOSIS — G8929 Other chronic pain: Secondary | ICD-10-CM

## 2020-11-11 DIAGNOSIS — R102 Pelvic and perineal pain: Secondary | ICD-10-CM

## 2020-11-11 DIAGNOSIS — K625 Hemorrhage of anus and rectum: Secondary | ICD-10-CM | POA: Insufficient documentation

## 2020-11-11 DIAGNOSIS — K219 Gastro-esophageal reflux disease without esophagitis: Secondary | ICD-10-CM | POA: Insufficient documentation

## 2020-11-11 DIAGNOSIS — I10 Essential (primary) hypertension: Secondary | ICD-10-CM | POA: Diagnosis not present

## 2020-11-11 DIAGNOSIS — R110 Nausea: Secondary | ICD-10-CM | POA: Insufficient documentation

## 2020-11-11 DIAGNOSIS — G4700 Insomnia, unspecified: Secondary | ICD-10-CM

## 2020-11-11 DIAGNOSIS — E8881 Metabolic syndrome: Secondary | ICD-10-CM

## 2020-11-11 DIAGNOSIS — R635 Abnormal weight gain: Secondary | ICD-10-CM | POA: Insufficient documentation

## 2020-11-11 DIAGNOSIS — R141 Gas pain: Secondary | ICD-10-CM | POA: Insufficient documentation

## 2020-11-11 DIAGNOSIS — R1111 Vomiting without nausea: Secondary | ICD-10-CM | POA: Insufficient documentation

## 2020-11-11 DIAGNOSIS — E559 Vitamin D deficiency, unspecified: Secondary | ICD-10-CM

## 2020-11-11 DIAGNOSIS — R1033 Periumbilical pain: Secondary | ICD-10-CM | POA: Insufficient documentation

## 2020-11-11 MED ORDER — HYDROCHLOROTHIAZIDE 12.5 MG PO TABS: 12.5000 mg | ORAL_TABLET | Freq: Every day | ORAL | 1 refills | Status: DC

## 2020-11-11 MED ORDER — PROMETHAZINE HCL 25 MG PO TABS: 12.5000 mg | ORAL_TABLET | Freq: Four times a day (QID) | ORAL | 0 refills | Status: DC | PRN

## 2020-11-11 MED ORDER — METFORMIN HCL ER 750 MG PO TB24: 750.0000 mg | ORAL_TABLET | Freq: Every day | ORAL | 1 refills | Status: DC

## 2020-11-11 MED ORDER — OZEMPIC (1 MG/DOSE) 2 MG/1.5ML ~~LOC~~ SOPN: 1.0000 mg | PEN_INJECTOR | SUBCUTANEOUS | 1 refills | Status: DC
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
## 2020-11-12 ENCOUNTER — Emergency Department (HOSPITAL_BASED_OUTPATIENT_CLINIC_OR_DEPARTMENT_OTHER): Payer: Federal, State, Local not specified - PPO

## 2020-11-12 ENCOUNTER — Emergency Department (HOSPITAL_BASED_OUTPATIENT_CLINIC_OR_DEPARTMENT_OTHER)
Admission: EM | Admit: 2020-11-12 | Discharge: 2020-11-12 | Disposition: A | Discharge status: Home / Self Care | Payer: Federal, State, Local not specified - PPO | Attending: Emergency Medicine | Admitting: Emergency Medicine

## 2020-11-12 ENCOUNTER — Other Ambulatory Visit: Payer: Self-pay | Admitting: Family Medicine

## 2020-11-12 ENCOUNTER — Telehealth: Payer: Self-pay | Admitting: Family Medicine

## 2020-11-12 ENCOUNTER — Encounter (HOSPITAL_BASED_OUTPATIENT_CLINIC_OR_DEPARTMENT_OTHER): Payer: Self-pay | Admitting: *Deleted

## 2020-11-12 ENCOUNTER — Other Ambulatory Visit: Payer: Self-pay

## 2020-11-12 DIAGNOSIS — R1011 Right upper quadrant pain: Secondary | ICD-10-CM | POA: Diagnosis not present

## 2020-11-12 DIAGNOSIS — Z7984 Long term (current) use of oral hypoglycemic drugs: Secondary | ICD-10-CM | POA: Diagnosis not present

## 2020-11-12 DIAGNOSIS — I10 Essential (primary) hypertension: Secondary | ICD-10-CM | POA: Diagnosis not present

## 2020-11-12 DIAGNOSIS — K219 Gastro-esophageal reflux disease without esophagitis: Secondary | ICD-10-CM | POA: Diagnosis not present

## 2020-11-12 DIAGNOSIS — K8050 Calculus of bile duct without cholangitis or cholecystitis without obstruction: Secondary | ICD-10-CM | POA: Insufficient documentation

## 2020-11-12 DIAGNOSIS — Z79899 Other long term (current) drug therapy: Secondary | ICD-10-CM | POA: Insufficient documentation

## 2020-11-12 DIAGNOSIS — K8020 Calculus of gallbladder without cholecystitis without obstruction: Secondary | ICD-10-CM

## 2020-11-12 DIAGNOSIS — R1013 Epigastric pain: Secondary | ICD-10-CM | POA: Diagnosis not present

## 2020-11-12 DIAGNOSIS — E119 Type 2 diabetes mellitus without complications: Secondary | ICD-10-CM | POA: Diagnosis not present

## 2020-11-12 LAB — COMPREHENSIVE METABOLIC PANEL
ALT: 7 U/L (ref 0–44)
AST: 11 U/L — ABNORMAL LOW (ref 15–41)
Albumin: 4.3 g/dL (ref 3.5–5.0)
Alkaline Phosphatase: 46 U/L (ref 38–126)
Anion gap: 8 (ref 5–15)
BUN: 9 mg/dL (ref 6–20)
CO2: 26 mmol/L (ref 22–32)
Calcium: 10.1 mg/dL (ref 8.9–10.3)
Chloride: 105 mmol/L (ref 98–111)
Creatinine, Ser: 0.84 mg/dL (ref 0.44–1.00)
GFR, Estimated: >60 mL/min (ref >60)
Glucose, Bld: 97 mg/dL (ref 70–99)
Potassium: 3.9 mmol/L (ref 3.5–5.1)
Sodium: 139 mmol/L (ref 135–145)
Total Bilirubin: 0.7 mg/dL (ref 0.3–1.2)
Total Protein: 6.8 g/dL (ref 6.5–8.1)

## 2020-11-12 LAB — URINALYSIS, ROUTINE W REFLEX MICROSCOPIC
Bilirubin Urine: NEGATIVE
Glucose, UA: NEGATIVE mg/dL
Ketones, ur: NEGATIVE mg/dL
Leukocytes,Ua: NEGATIVE
Nitrite: NEGATIVE
Protein, ur: NEGATIVE mg/dL
Specific Gravity, Urine: 1.016 (ref 1.005–1.030)
pH: 6 (ref 5.0–8.0)

## 2020-11-12 LAB — CBC
HCT: 41.3 % (ref 36.0–46.0)
Hemoglobin: 13.8 g/dL (ref 12.0–15.0)
MCH: 28.9 pg (ref 26.0–34.0)
MCHC: 33.4 g/dL (ref 30.0–36.0)
MCV: 86.6 fL (ref 80.0–100.0)
Platelets: 279 10*3/uL (ref 150–400)
RBC: 4.77 MIL/uL (ref 3.87–5.11)
RDW: 12.6 % (ref 11.5–15.5)
WBC: 6.3 10*3/uL (ref 4.0–10.5)
nRBC: 0 % (ref 0.0–0.2)

## 2020-11-12 LAB — LIPASE, BLOOD: Lipase: 22 U/L (ref 11–51)

## 2020-11-12 MED ORDER — ALUM & MAG HYDROXIDE-SIMETH 200-200-20 MG/5ML PO SUSP
30.0000 mL | Freq: Once | ORAL | Status: AC
  Administered 2020-11-12: 30 mL via ORAL
  Filled 2020-11-12: qty 30

## 2020-11-12 MED ORDER — ONDANSETRON HCL 4 MG/2ML IJ SOLN
4.0000 mg | Freq: Once | INTRAMUSCULAR | Status: AC
  Administered 2020-11-12: 4 mg via INTRAVENOUS
  Filled 2020-11-12: qty 2

## 2020-11-12 MED ORDER — LIDOCAINE VISCOUS HCL 2 % MT SOLN
15.0000 mL | Freq: Once | OROMUCOSAL | Status: AC
  Administered 2020-11-12: 15 mL via ORAL
  Filled 2020-11-12: qty 15

## 2020-11-12 MED ORDER — MORPHINE SULFATE (PF) 4 MG/ML IV SOLN
4.0000 mg | Freq: Once | INTRAVENOUS | Status: AC
  Administered 2020-11-12: 4 mg via INTRAVENOUS
  Filled 2020-11-12: qty 1

## 2020-11-12 NOTE — ED Provider Notes (Signed)
[facility] EMERGENCY DEPT Provider Note   CSN: [id number] Arrival date & time: 11/12/20  0344     History Chief Complaint  Patient presents with   Abdominal Pain    Stacey Stanley is a 45 y.o. female.  HPI      This is a 45 year old female with a history of diabetes, endometriosis with some chronic abdominal pain, reflux, hypertension who presents with abdominal pain.  Patient reports for the last month she has had increasing episodes of abdominal pain.  She reports that the episodes are mostly after eating.  She has crampy epigastric discomfort that is nonradiating.  Currently her pain is 5 out of 10.  She states that her pain has been persistent overnight.  No nausea or vomiting.  Normal bowel movements.  Denies fevers.  No urinary symptoms.  She has not taken anything for her symptoms.  Past Medical History:  Diagnosis Date   Anxiety    no current meds   Arthritis    left knee pain - otc med prn   Chronic insomnia    Depression    Diabetes mellitus without complication ([place])    Dysrhythmia    Home with daughter [name]   Endometriosis    GERD (gastroesophageal reflux disease)    diet control - no meds   Gout    wrist, feet, hands bilateral   Headache(784.0)    hx - last one 2 yrs ago - no meds   Hypertension    Kidney stones    Metabolic syndrome    Obesity    Serum calcium elevated    Sleep disorder, circadian, shift work type    SVD (spontaneous vaginal delivery)    x 2   Tachycardia    History - neg cardiac tests-stress related    Patient Active Problem List   Diagnosis Date Noted   Constipation 11/11/2020   Vomiting without nausea 11/11/2020   Gastroesophageal reflux disease 11/11/2020   Chronic pain syndrome 12/28/2017   Opiate use 12/28/2017   Long term prescription benzodiazepine use 12/28/2017   Thrombocytosis 10/26/2017   Chronic idiopathic constipation 10/13/2017   Pelvic pain 09/09/2017   Mass of left ovary  06/28/2017   Kidney stone on left side 06/28/2017   Cervical radiculitis 03/08/2017   Vitamin B12 deficiency 08/19/2016   Prediabetes 08/19/2016   Right thyroid nodule 07/31/2015   Chronic pelvic pain in female 04/05/2015   GERD (gastroesophageal reflux disease) 04/05/2015   Mild major depression ([place]) 11/25/2014   Insomnia, persistent 11/25/2014   Endometriosis 11/25/2014   Essential (primary) hypertension 44/06/270   Dysmetabolic syndrome 53/66/4403    Past Surgical History:  Procedure Laterality Date   ABDOMINAL HYSTERECTOMY  05/21/2016   DILATION AND CURETTAGE OF UTERUS     EYE SURGERY  06/28/13   right laser eye surgery - repair retina   HEMORROIDECTOMY  04/04/2018   Dr. Levin Bacon    HYSTEROSCOPY WITH D & C N/A 07/19/2013   Procedure: DILATATION AND CURETTAGE Pollyann Glen;  Surgeon: Marvene Staff, MD;  Location: [facility];  Service: Gynecology;  Laterality: N/A;   INDUCED ABORTION     LAPAROSCOPY  1992   endometriosis   LAPAROSCOPY N/A 07/19/2013   Procedure: LAPAROSCOPY DIAGNOSTIC  with resection of endometriosis;  Surgeon: Marvene Staff, MD;  Location: [facility];  Service: Gynecology;  Laterality: N/A;   metatarsil   2003   left foot surgery      OB History     Gravida  3   Para  2   Term  2   Preterm  0   AB  1   Living  2      SAB  1   IAB  0   Ectopic  0   Multiple  0   Live Births              Family History  Problem Relation Age of Onset   Diabetes Mother    Hypertension Mother    Hypertension Father    Kidney disease Father    Stroke Maternal Grandmother    Leukemia Paternal Grandmother    Alzheimer's disease Paternal Grandfather    Heart disease Brother    Anesthesia problems Neg Hx    Hypotension Neg Hx    Malignant hyperthermia Neg Hx    Pseudochol deficiency Neg Hx     Social History   Tobacco Use   Smoking status: Former    Packs/day: 0.50    Years: 25.00    Pack years: 12.50    Types: Cigarettes    Start  date: 11/25/1993    Quit date: 09/29/2020    Years since quitting: 0.1   Smokeless tobacco: Never  Vaping Use   Vaping Use: Former   Start date: 06/01/2014   Quit date: 06/02/2015  Substance Use Topics   Alcohol use: No    Alcohol/week: 0.0 standard drinks   Drug use: No    Home Medications Prior to Admission medications   Medication Sig Start Date End Date Taking? Authorizing Provider  Dapsone 5 % topical gel Apply 60 g topically daily. 09/21/19   [provider]  hydrochlorothiazide (HYDRODIURIL) 12.5 MG tablet Take 1 tablet (12.5 mg total) by mouth daily. 11/11/20   Steele Sizer, MD  ibuprofen (ADVIL,MOTRIN) 800 MG tablet Take 800 mg by mouth every 8 (eight) hours as needed.    [provider]  imiquimod (ALDARA) 5 % cream Apply 1 packet topically daily. 09/21/19   [provider]  metFORMIN (GLUCOPHAGE-XR) 750 MG 24 hr tablet Take 1 tablet (750 mg total) by mouth daily with breakfast. 11/11/20   Steele Sizer, MD  Omega-3 Fatty Acids (FISH OIL) 1000 MG CAPS Take 4,000 mg by mouth daily.    [provider]  promethazine (PHENERGAN) 25 MG tablet Take 0.5-1 tablets (12.5-25 mg total) by mouth every 6 (six) hours as needed for nausea or vomiting. 11/11/20   Steele Sizer, MD  Semaglutide, 1 MG/DOSE, (OZEMPIC, 1 MG/DOSE,) 2 MG/1.5ML SOPN Inject 1 mg into the skin once a week. 11/11/20   Steele Sizer, MD  tapentadol HCl (NUCYNTA) 75 MG tablet Take 1 tablet (75 mg total) by mouth 2 (two) times daily as needed. 10/23/20 11/22/20  [name], MD  tapentadol HCl (NUCYNTA) 75 MG tablet Take 1 tablet (75 mg total) by mouth 2 (two) times daily as needed. 11/22/20 12/22/20  [name], MD  tapentadol HCl (NUCYNTA) 75 MG tablet Take 1 tablet (75 mg total) by mouth 2 (two) times daily as needed. 12/22/20 01/21/21  [name], MD  TRI-LUMA 0.01-4-0.05 % CREA Apply 30 mg topically daily. 09/21/19   [provider]    Allergies    Nuvigil  [armodafinil]  Review of Systems   Review of Systems  Constitutional:  Negative for fever.  Respiratory:  Negative for shortness of breath.   Cardiovascular:  Negative for chest pain.  Gastrointestinal:  Positive for abdominal pain. Negative for diarrhea, nausea and vomiting.  Genitourinary:  Negative  for dysuria.  All other systems reviewed and are negative.  Physical Exam Updated Vital Signs BP (!) 126/91   Pulse 72   Temp 98.2 F (36.8 C) (Oral)   Resp 20   Ht 1.651 m (5\' 5" )   Wt 77.1 kg   LMP 07/09/2015 (Exact Date)   SpO2 100%   BMI 28.29 kg/m   Physical Exam Vitals and nursing note reviewed.  Constitutional:      Appearance: She is well-developed.  HENT:     Head: Normocephalic and atraumatic.  Eyes:     Pupils: Pupils are equal, round, and reactive to light.  Cardiovascular:     Rate and Rhythm: Normal rate and regular rhythm.     Heart sounds: Normal heart sounds.  Pulmonary:     Effort: Pulmonary effort is normal. No respiratory distress.     Breath sounds: No wheezing.  Abdominal:     General: Bowel sounds are normal.     Palpations: Abdomen is soft.     Tenderness: There is abdominal tenderness in the epigastric area. There is no guarding or rebound. Negative signs include Murphy's sign.  Musculoskeletal:     Cervical back: Neck supple.  Skin:    General: Skin is warm and dry.  Neurological:     Mental Status: She is alert and oriented to person, place, and time.  Psychiatric:        Mood and Affect: Mood normal.    ED Results / Procedures / Treatments   Labs (all labs ordered are listed, but only abnormal results are displayed) Labs Reviewed  COMPREHENSIVE METABOLIC PANEL - Abnormal; Notable for the following components:      Result Value   AST 11 (*)    All other components within normal limits  URINALYSIS, ROUTINE W REFLEX MICROSCOPIC - Abnormal; Notable for the following components:   Hgb urine dipstick TRACE (*)    All other components  within normal limits  LIPASE, BLOOD  CBC    EKG None  Radiology US Abdomen Limited RUQ (LIVER/GB)  Result Date: 11/12/2020 CLINICAL DATA:  One month of right upper quadrant pain EXAM: ULTRASOUND ABDOMEN LIMITED RIGHT UPPER QUADRANT COMPARISON:  CT 11/24/2019, ultrasound 03/15/2015 FINDINGS: Gallbladder: Gallbladder contracted around multiple echogenic, posteriorly shadowing gallstones, largest visible gallstone measuring up to 4 mm in maximal diameter. No significant gallbladder wall thickening. No visible pericholecystic fluid. Sonographic Percell Miller sign is reportedly negative. Common bile duct: Diameter: 6 mm, borderline dilated Liver: No focal lesion identified. Within normal limits in parenchymal echogenicity. Portal vein is patent on color Doppler imaging with normal direction of blood flow towards the liver. Other: None. IMPRESSION: Cholelithiasis with the gallbladder contracted around numerous echogenic, shadowing gallstones. No significant gallbladder wall thickening or positive sonographic Murphy sign to suggest an acute cholecystitis. Common bile duct measures up to 6 mm, borderline dilated. Recommend correlation with serologies and if abnormal, MRCP or ERCP could be obtained. This recommendation follows ACR consensus guidelines: White Paper of the ACR Incidental Findings Committee II on Gallbladder and Biliary Findings. J Am Coll Radiol 2013:;10:953-956. Electronically Signed   By: Lovena Le M.D.   On: 11/12/2020 06:33    Procedures Procedures   Medications Ordered in ED Medications  morphine 4 MG/ML injection 4 mg (4 mg Intravenous Given 11/12/20 0445)  ondansetron (ZOFRAN) injection 4 mg (4 mg Intravenous Given 11/12/20 0444)  alum & mag hydroxide-simeth (MAALOX/MYLANTA) 200-200-20 MG/5ML suspension 30 mL (30 mLs Oral Given 11/12/20 0442)    And  lidocaine (XYLOCAINE) 2 % viscous mouth solution 15 mL (15 mLs Oral Given 11/12/20 0443)    ED Course  I have reviewed the triage vital  signs and the nursing notes.  Pertinent labs & imaging results that were available during my care of the patient were reviewed by me and considered in my medical decision making (see chart for details).  Clinical Course as of 11/12/20 0710  Tue Nov 12, 2020  0533 Patient generally improved on recheck.  Ultrasound imaging is now available.  Will obtain right upper quadrant ultrasound to rule out gallbladder pathology. [CH]    Clinical Course User Index [CH] Yashvi Jasinski, Barbette Hair, MD   MDM Rules/Calculators/A&P                          Patient presents with abdominal pain.  Mostly postprandial.  She is nontoxic and vital signs are reassuring.  Considerations include but not limited to, reflux, peptic ulcer, gallbladder pathology, pancreatitis.  Less likely appendicitis.  Patient given pain and nausea medication.  Labs obtained.  No leukocytosis.  No significant metabolic derangements.  LFTs and lipase are normal.  Given location of pain and postprandial nature, will obtain right upper quadrant ultrasound.  Right upper quadrant ultrasound does show significant cholelithiasis.  Borderline common bile duct distention.  No obstructive pathology suggested on lab work.  Patient is improved and able to tolerate fluids.  Recommend close surgical follow-up for elective removal.  Suspect this is the etiology for her pain.  She is on chronic pain medication.  She has Zofran at home.  After history, exam, and medical workup I feel the patient has been appropriately medically screened and is safe for discharge home. Pertinent diagnoses were discussed with the patient. Patient was given return precautions.    Final Clinical Impression(s) / ED Diagnoses Final diagnoses:  RUQ pain  Biliary colic    Rx / DC Orders ED Discharge Orders     None        Merryl Hacker, MD 11/12/20 [phone]

## 2020-11-12 NOTE — ED Notes (Signed)
ED Provider at bedside. 

## 2020-11-12 NOTE — Discharge Instructions (Addendum)
You were seen today for abdominal pain.  This is likely related to your gallbladder.  You need to follow-up with general surgery may need to have your gallbladder removed.  Take your home pain medication as prescribed.  If you develop new or worsening symptoms, nausea, vomiting, you should be reevaluated immediately

## 2020-11-12 NOTE — ED Triage Notes (Signed)
Lower abdominal pain "for a couple of weeks", worse last night after eating. Denies n/v or diarrhea.

## 2020-11-12 NOTE — Telephone Encounter (Signed)
Pt was seen ER and was advised that she needs her Gallbladder removed /she was referred to Dr. Grandville Silos and pt stated she has dealt with that office before and not had a positive experience and now is not able to reach them this time for this matter / pt would like to know if Dr. Ancil Boozer has a provider she can refer for this surgery asap / Pt stated she can not wait long / please advise

## 2020-11-21 ENCOUNTER — Encounter: Payer: Self-pay | Admitting: General Surgery

## 2020-11-21 ENCOUNTER — Ambulatory Visit: Payer: Federal, State, Local not specified - PPO | Admitting: General Surgery

## 2020-11-21 ENCOUNTER — Other Ambulatory Visit: Payer: Self-pay

## 2020-11-21 VITALS — BP 110/78 | HR 88 | Temp 98.3°F | Ht 65.0 in | Wt 179.0 lb

## 2020-11-21 DIAGNOSIS — K8020 Calculus of gallbladder without cholecystitis without obstruction: Secondary | ICD-10-CM | POA: Diagnosis not present

## 2020-11-21 NOTE — H&P (View-Only) (Signed)
Patient ID: FANTASY DONALD, female   DOB: [birth year], 45 y.o.   MRN: [id number]  Chief Complaint  Patient presents with   New Patient (Initial Visit)    Gallbladder    HPI Stacey Stanley is a 45 y.o. female.   She has been referred by her primary care provider, Dr. Steele Sizer, for further evaluation of symptomatic cholelithiasis.  She has been having epigastric and right upper quadrant pain as well as nausea for about the last year.  It does not seem to be related to any specific dietary intake, as far as fatty or fried foods, but does seem to be most prevalent after eating.  She has never had pancreatitis or jaundice.  She was recently seen in the emergency department (November 12, 2020).  A right upper quadrant ultrasound was performed that demonstrated a contracted gallbladder with multiple gallstones.  No gallbladder wall thickening or pericholecystic fluid appreciated.  No sonographic Murphy sign.  Labs were largely unremarkable and did not suggest cholecystitis or any biliary obstruction.  She is here today to discuss possible cholecystectomy.   Past Medical History:  Diagnosis Date   Anxiety    no current meds   Arthritis    left knee pain - otc med prn   Chronic insomnia    Depression    Diabetes mellitus without complication ([place])    Dysrhythmia    Home with daughter [name]   Endometriosis    GERD (gastroesophageal reflux disease)    diet control - no meds   Gout    wrist, feet, hands bilateral   Headache(784.0)    hx - last one 2 yrs ago - no meds   Hypertension    Kidney stones    Metabolic syndrome    Obesity    Serum calcium elevated    Sleep disorder, circadian, shift work type    SVD (spontaneous vaginal delivery)    x 2   Tachycardia    History - neg cardiac tests-stress related    Past Surgical History:  Procedure Laterality Date   ABDOMINAL HYSTERECTOMY  05/21/2016   DILATION AND CURETTAGE OF UTERUS     EYE SURGERY  06/28/13   right laser  eye surgery - repair retina   HEMORROIDECTOMY  04/04/2018   Dr. Levin Bacon    HYSTEROSCOPY WITH D & C N/A 07/19/2013   Procedure: DILATATION AND CURETTAGE Pollyann Glen;  Surgeon: Marvene Staff, MD;  Location: [facility];  Service: Gynecology;  Laterality: N/A;   INDUCED ABORTION     LAPAROSCOPY  1992   endometriosis   LAPAROSCOPY N/A 07/19/2013   Procedure: LAPAROSCOPY DIAGNOSTIC  with resection of endometriosis;  Surgeon: Marvene Staff, MD;  Location: [facility];  Service: Gynecology;  Laterality: N/A;   metatarsil   2003   left foot surgery     Family History  Problem Relation Age of Onset   Diabetes Mother    Hypertension Mother    Hypertension Father    Kidney disease Father    Stroke Maternal Grandmother    Leukemia Paternal Grandmother    Alzheimer's disease Paternal Grandfather    Heart disease Brother    Anesthesia problems Neg Hx    Hypotension Neg Hx    Malignant hyperthermia Neg Hx    Pseudochol deficiency Neg Hx     Social History Social History   Tobacco Use   Smoking status: Every Day    Packs/day: 0.50    Years: 25.00    Pack  years: 12.50    Types: Cigarettes    Start date: 11/25/1993    Last attempt to quit: 09/29/2020    Years since quitting: 0.1   Smokeless tobacco: Never  Vaping Use   Vaping Use: Never used  Substance Use Topics   Alcohol use: No    Alcohol/week: 0.0 standard drinks   Drug use: No    Allergies  Allergen Reactions   Nuvigil [Armodafinil]     Chest pain     Current Outpatient Medications  Medication Sig Dispense Refill   Dapsone 5 % topical gel Apply 60 g topically daily.     hydrochlorothiazide (HYDRODIURIL) 12.5 MG tablet Take 1 tablet (12.5 mg total) by mouth daily. 90 tablet 1   ibuprofen (ADVIL,MOTRIN) 800 MG tablet Take 800 mg by mouth every 8 (eight) hours as needed.     imiquimod (ALDARA) 5 % cream Apply 1 packet topically daily.     metFORMIN (GLUCOPHAGE-XR) 750 MG 24 hr tablet Take 1 tablet (750 mg total) by  mouth daily with breakfast. 90 tablet 1   Omega-3 Fatty Acids (FISH OIL) 1000 MG CAPS Take 4,000 mg by mouth daily.     promethazine (PHENERGAN) 25 MG tablet Take 0.5-1 tablets (12.5-25 mg total) by mouth every 6 (six) hours as needed for nausea or vomiting. 30 tablet 0   Semaglutide, 1 MG/DOSE, (OZEMPIC, 1 MG/DOSE,) 2 MG/1.5ML SOPN Inject 1 mg into the skin once a week. 27 mL 1   tapentadol HCl (NUCYNTA) 75 MG tablet Take 1 tablet (75 mg total) by mouth 2 (two) times daily as needed. 45 tablet 0   [START ON 11/22/2020] tapentadol HCl (NUCYNTA) 75 MG tablet Take 1 tablet (75 mg total) by mouth 2 (two) times daily as needed. 45 tablet 0   [START ON 12/22/2020] tapentadol HCl (NUCYNTA) 75 MG tablet Take 1 tablet (75 mg total) by mouth 2 (two) times daily as needed. 45 tablet 0   TRI-LUMA 0.01-4-0.05 % CREA Apply 30 mg topically daily.     No current facility-administered medications for this visit.    Review of Systems Review of Systems  Constitutional:  Positive for chills, diaphoresis and fatigue.  Gastrointestinal:  Positive for abdominal pain, constipation, diarrhea, nausea, rectal pain and vomiting.  All other systems reviewed and are negative. Or as discussed in the history of present illness.  Blood pressure 110/78, pulse 88, temperature 98.3 F (36.8 C), height 5\' 5"  (1.651 m), weight 179 lb (81.2 kg), last menstrual period 07/09/2015, SpO2 96 %. Body mass index is 29.79 kg/m.  Physical Exam Physical Exam Constitutional:      General: She is not in acute distress.    Appearance: Normal appearance.  HENT:     Head: Normocephalic and atraumatic.     Nose:     Comments: Covered with a mask    Mouth/Throat:     Comments: Covered with a mask Eyes:     General: No scleral icterus.       Right eye: No discharge.        Left eye: No discharge.  Neck:     Comments: No palpable cervical or supraclavicular lymphadenopathy.  The trachea is midline.  No thyromegaly or dominant thyroid  masses appreciated.  The gland moves freely with deglutition. Cardiovascular:     Rate and Rhythm: Normal rate and regular rhythm.  Pulmonary:     Effort: Pulmonary effort is normal.     Breath sounds: Normal breath sounds.  Abdominal:  General: Bowel sounds are normal.     Palpations: Abdomen is soft.     Comments: Mild tenderness to palpation in the epigastrium.  Murphy sign is negative.  Genitourinary:    Comments: Deferred Musculoskeletal:        General: No swelling or tenderness.  Skin:    General: Skin is warm and dry.  Neurological:     General: No focal deficit present.     Mental Status: She is alert and oriented to person, place, and time.  Psychiatric:        Mood and Affect: Mood normal.        Behavior: Behavior normal.    Data Reviewed Results for Stacey Stanley, Stacey Stanley (MRN [id number]) as of 11/21/2020 16:11  Ref. Range 11/12/2020 03:55  COMPREHENSIVE METABOLIC PANEL Unknown Rpt (A)  Sodium Latest Ref Range: 135 - 145 mmol/L 139  Potassium Latest Ref Range: 3.5 - 5.1 mmol/L 3.9  Chloride Latest Ref Range: 98 - 111 mmol/L 105  CO2 Latest Ref Range: 22 - 32 mmol/L 26  Glucose Latest Ref Range: 70 - 99 mg/dL 97  BUN Latest Ref Range: 6 - 20 mg/dL 9  Creatinine Latest Ref Range: 0.44 - 1.00 mg/dL 0.84  Calcium Latest Ref Range: 8.9 - 10.3 mg/dL 10.1  Anion gap Latest Ref Range: 5 - 15  8  Alkaline Phosphatase Latest Ref Range: 38 - 126 U/L 46  Albumin Latest Ref Range: 3.5 - 5.0 g/dL 4.3  Lipase Latest Ref Range: 11 - 51 U/L 22  AST Latest Ref Range: 15 - 41 U/L 11 (L)  ALT Latest Ref Range: 0 - 44 U/L 7  Total Protein Latest Ref Range: 6.5 - 8.1 g/dL 6.8  Total Bilirubin Latest Ref Range: 0.3 - 1.2 mg/dL 0.7  GFR, Estimated Latest Ref Range: >60 mL/min >60  WBC Latest Ref Range: 4.0 - 10.5 K/uL 6.3  RBC Latest Ref Range: 3.87 - 5.11 MIL/uL 4.77  Hemoglobin Latest Ref Range: 12.0 - 15.0 g/dL 13.8  HCT Latest Ref Range: 36.0 - 46.0 % 41.3  MCV Latest Ref Range:  80.0 - 100.0 fL 86.6  MCH Latest Ref Range: 26.0 - 34.0 pg 28.9  MCHC Latest Ref Range: 30.0 - 36.0 g/dL 33.4  RDW Latest Ref Range: 11.5 - 15.5 % 12.6  Platelets Latest Ref Range: 150 - 400 K/uL 279  nRBC Latest Ref Range: 0.0 - 0.2 % 0.0   These labs are unremarkable and as discussed in the history of present illness, do not suggest biliary obstruction or cholecystitis.  I reviewed the ultrasound performed on November 12, 2020 and concur with the radiology interpretation copied here: CLINICAL DATA:  One month of right upper quadrant pain   EXAM: ULTRASOUND ABDOMEN LIMITED RIGHT UPPER QUADRANT   COMPARISON:  CT 11/24/2019, ultrasound 03/15/2015   FINDINGS: Gallbladder:   Gallbladder contracted around multiple echogenic, posteriorly shadowing gallstones, largest visible gallstone measuring up to 4 mm in maximal diameter. No significant gallbladder wall thickening. No visible pericholecystic fluid. Sonographic Percell Miller sign is reportedly negative.   Common bile duct:   Diameter: 6 mm, borderline dilated   Liver:   No focal lesion identified. Within normal limits in parenchymal echogenicity. Portal vein is patent on color Doppler imaging with normal direction of blood flow towards the liver.   Other: None.   IMPRESSION: Cholelithiasis with the gallbladder contracted around numerous echogenic, shadowing gallstones. No significant gallbladder wall thickening or positive sonographic Murphy sign to suggest an acute cholecystitis.  Common bile duct measures up to 6 mm, borderline dilated. Recommend correlation with serologies and if abnormal, MRCP or ERCP could be obtained. This recommendation follows ACR consensus guidelines: White Paper of the ACR Incidental Findings Committee II on Gallbladder and Biliary Findings. J Am Coll Radiol 2013:;10:953-956.      Assessment This is a 45 year old woman who has been having right upper quadrant and epigastric pain for nearly a year.   Her symptoms are consistent with biliary colic and she does have gallstones.  Plan I have offered her a robot-assisted laparoscopic cholecystectomy.I discussed the procedure in detail.  We discussed the risks and benefits of a laparoscopic cholecystectomy and possible cholangiogram including, but not limited to: bleeding, infection, injury to surrounding structures such as the intestine or liver, bile leak, retained gallstones, need to convert to an open procedure, prolonged diarrhea, blood clots such as DVT, common bile duct injury, anesthesia risks, and possible need for additional procedures. The patient had the opportunity to ask any questions and these were answered to her satisfaction.     Fredirick Maudlin 11/21/2020, 4:05 PM

## 2020-11-21 NOTE — Progress Notes (Signed)
Patient ID: HAVAH AMMON, female   DOB: Jan 12, 1976, 45 y.o.   MRN: [id number]  Chief Complaint  Patient presents with   New Patient (Initial Visit)    Gallbladder    HPI Stacey Stanley is a 45 y.o. female.   She has been referred by her primary care provider, Dr. Steele Sizer, for further evaluation of symptomatic cholelithiasis.  She has been having epigastric and right upper quadrant pain as well as nausea for about the last year.  It does not seem to be related to any specific dietary intake, as far as fatty or fried foods, but does seem to be most prevalent after eating.  She has never had pancreatitis or jaundice.  She was recently seen in the emergency department (November 12, 2020).  A right upper quadrant ultrasound was performed that demonstrated a contracted gallbladder with multiple gallstones.  No gallbladder wall thickening or pericholecystic fluid appreciated.  No sonographic Murphy sign.  Labs were largely unremarkable and did not suggest cholecystitis or any biliary obstruction.  She is here today to discuss possible cholecystectomy.   Past Medical History:  Diagnosis Date   Anxiety    no current meds   Arthritis    left knee pain - otc med prn   Chronic insomnia    Depression    Diabetes mellitus without complication ([place])    Dysrhythmia    Home with daughter [name]   Endometriosis    GERD (gastroesophageal reflux disease)    diet control - no meds   Gout    wrist, feet, hands bilateral   Headache(784.0)    hx - last one 2 yrs ago - no meds   Hypertension    Kidney stones    Metabolic syndrome    Obesity    Serum calcium elevated    Sleep disorder, circadian, shift work type    SVD (spontaneous vaginal delivery)    x 2   Tachycardia    History - neg cardiac tests-stress related    Past Surgical History:  Procedure Laterality Date   ABDOMINAL HYSTERECTOMY  05/21/2016   DILATION AND CURETTAGE OF UTERUS     EYE SURGERY  06/28/13   right laser  eye surgery - repair retina   HEMORROIDECTOMY  04/04/2018   Dr. Levin Bacon    HYSTEROSCOPY WITH D & C N/A 07/19/2013   Procedure: DILATATION AND CURETTAGE Pollyann Glen;  Surgeon: Marvene Staff, MD;  Location: [facility];  Service: Gynecology;  Laterality: N/A;   INDUCED ABORTION     LAPAROSCOPY  1992   endometriosis   LAPAROSCOPY N/A 07/19/2013   Procedure: LAPAROSCOPY DIAGNOSTIC  with resection of endometriosis;  Surgeon: Marvene Staff, MD;  Location: [facility];  Service: Gynecology;  Laterality: N/A;   metatarsil   2003   left foot surgery     Family History  Problem Relation Age of Onset   Diabetes Mother    Hypertension Mother    Hypertension Father    Kidney disease Father    Stroke Maternal Grandmother    Leukemia Paternal Grandmother    Alzheimer's disease Paternal Grandfather    Heart disease Brother    Anesthesia problems Neg Hx    Hypotension Neg Hx    Malignant hyperthermia Neg Hx    Pseudochol deficiency Neg Hx     Social History Social History   Tobacco Use   Smoking status: Every Day    Packs/day: 0.50    Years: 25.00    Pack  years: 12.50    Types: Cigarettes    Start date: 11/25/1993    Last attempt to quit: 09/29/2020    Years since quitting: 0.1   Smokeless tobacco: Never  Vaping Use   Vaping Use: Never used  Substance Use Topics   Alcohol use: No    Alcohol/week: 0.0 standard drinks   Drug use: No    Allergies  Allergen Reactions   Nuvigil [Armodafinil]     Chest pain     Current Outpatient Medications  Medication Sig Dispense Refill   Dapsone 5 % topical gel Apply 60 g topically daily.     hydrochlorothiazide (HYDRODIURIL) 12.5 MG tablet Take 1 tablet (12.5 mg total) by mouth daily. 90 tablet 1   ibuprofen (ADVIL,MOTRIN) 800 MG tablet Take 800 mg by mouth every 8 (eight) hours as needed.     imiquimod (ALDARA) 5 % cream Apply 1 packet topically daily.     metFORMIN (GLUCOPHAGE-XR) 750 MG 24 hr tablet Take 1 tablet (750 mg total) by  mouth daily with breakfast. 90 tablet 1   Omega-3 Fatty Acids (FISH OIL) 1000 MG CAPS Take 4,000 mg by mouth daily.     promethazine (PHENERGAN) 25 MG tablet Take 0.5-1 tablets (12.5-25 mg total) by mouth every 6 (six) hours as needed for nausea or vomiting. 30 tablet 0   Semaglutide, 1 MG/DOSE, (OZEMPIC, 1 MG/DOSE,) 2 MG/1.5ML SOPN Inject 1 mg into the skin once a week. 27 mL 1   tapentadol HCl (NUCYNTA) 75 MG tablet Take 1 tablet (75 mg total) by mouth 2 (two) times daily as needed. 45 tablet 0   [START ON 11/22/2020] tapentadol HCl (NUCYNTA) 75 MG tablet Take 1 tablet (75 mg total) by mouth 2 (two) times daily as needed. 45 tablet 0   [START ON 12/22/2020] tapentadol HCl (NUCYNTA) 75 MG tablet Take 1 tablet (75 mg total) by mouth 2 (two) times daily as needed. 45 tablet 0   TRI-LUMA 0.01-4-0.05 % CREA Apply 30 mg topically daily.     No current facility-administered medications for this visit.    Review of Systems Review of Systems  Constitutional:  Positive for chills, diaphoresis and fatigue.  Gastrointestinal:  Positive for abdominal pain, constipation, diarrhea, nausea, rectal pain and vomiting.  All other systems reviewed and are negative. Or as discussed in the history of present illness.  Blood pressure 110/78, pulse 88, temperature 98.3 F (36.8 C), height 5\' 5"  (1.651 m), weight 179 lb (81.2 kg), last menstrual period 07/09/2015, SpO2 96 %. Body mass index is 29.79 kg/m.  Physical Exam Physical Exam Constitutional:      General: She is not in acute distress.    Appearance: Normal appearance.  HENT:     Head: Normocephalic and atraumatic.     Nose:     Comments: Covered with a mask    Mouth/Throat:     Comments: Covered with a mask Eyes:     General: No scleral icterus.       Right eye: No discharge.        Left eye: No discharge.  Neck:     Comments: No palpable cervical or supraclavicular lymphadenopathy.  The trachea is midline.  No thyromegaly or dominant thyroid  masses appreciated.  The gland moves freely with deglutition. Cardiovascular:     Rate and Rhythm: Normal rate and regular rhythm.  Pulmonary:     Effort: Pulmonary effort is normal.     Breath sounds: Normal breath sounds.  Abdominal:  General: Bowel sounds are normal.     Palpations: Abdomen is soft.     Comments: Mild tenderness to palpation in the epigastrium.  Murphy sign is negative.  Genitourinary:    Comments: Deferred Musculoskeletal:        General: No swelling or tenderness.  Skin:    General: Skin is warm and dry.  Neurological:     General: No focal deficit present.     Mental Status: She is alert and oriented to person, place, and time.  Psychiatric:        Mood and Affect: Mood normal.        Behavior: Behavior normal.    Data Reviewed Results for EVANGELA, HEFFLER (MRN [id number]) as of 11/21/2020 16:11  Ref. Range 11/12/2020 03:55  COMPREHENSIVE METABOLIC PANEL Unknown Rpt (A)  Sodium Latest Ref Range: 135 - 145 mmol/L 139  Potassium Latest Ref Range: 3.5 - 5.1 mmol/L 3.9  Chloride Latest Ref Range: 98 - 111 mmol/L 105  CO2 Latest Ref Range: 22 - 32 mmol/L 26  Glucose Latest Ref Range: 70 - 99 mg/dL 97  BUN Latest Ref Range: 6 - 20 mg/dL 9  Creatinine Latest Ref Range: 0.44 - 1.00 mg/dL 0.84  Calcium Latest Ref Range: 8.9 - 10.3 mg/dL 10.1  Anion gap Latest Ref Range: 5 - 15  8  Alkaline Phosphatase Latest Ref Range: 38 - 126 U/L 46  Albumin Latest Ref Range: 3.5 - 5.0 g/dL 4.3  Lipase Latest Ref Range: 11 - 51 U/L 22  AST Latest Ref Range: 15 - 41 U/L 11 (L)  ALT Latest Ref Range: 0 - 44 U/L 7  Total Protein Latest Ref Range: 6.5 - 8.1 g/dL 6.8  Total Bilirubin Latest Ref Range: 0.3 - 1.2 mg/dL 0.7  GFR, Estimated Latest Ref Range: >60 mL/min >60  WBC Latest Ref Range: 4.0 - 10.5 K/uL 6.3  RBC Latest Ref Range: 3.87 - 5.11 MIL/uL 4.77  Hemoglobin Latest Ref Range: 12.0 - 15.0 g/dL 13.8  HCT Latest Ref Range: 36.0 - 46.0 % 41.3  MCV Latest Ref Range:  80.0 - 100.0 fL 86.6  MCH Latest Ref Range: 26.0 - 34.0 pg 28.9  MCHC Latest Ref Range: 30.0 - 36.0 g/dL 33.4  RDW Latest Ref Range: 11.5 - 15.5 % 12.6  Platelets Latest Ref Range: 150 - 400 K/uL 279  nRBC Latest Ref Range: 0.0 - 0.2 % 0.0   These labs are unremarkable and as discussed in the history of present illness, do not suggest biliary obstruction or cholecystitis.  I reviewed the ultrasound performed on November 12, 2020 and concur with the radiology interpretation copied here: CLINICAL DATA:  One month of right upper quadrant pain   EXAM: ULTRASOUND ABDOMEN LIMITED RIGHT UPPER QUADRANT   COMPARISON:  CT 11/24/2019, ultrasound 03/15/2015   FINDINGS: Gallbladder:   Gallbladder contracted around multiple echogenic, posteriorly shadowing gallstones, largest visible gallstone measuring up to 4 mm in maximal diameter. No significant gallbladder wall thickening. No visible pericholecystic fluid. Sonographic Percell Miller sign is reportedly negative.   Common bile duct:   Diameter: 6 mm, borderline dilated   Liver:   No focal lesion identified. Within normal limits in parenchymal echogenicity. Portal vein is patent on color Doppler imaging with normal direction of blood flow towards the liver.   Other: None.   IMPRESSION: Cholelithiasis with the gallbladder contracted around numerous echogenic, shadowing gallstones. No significant gallbladder wall thickening or positive sonographic Murphy sign to suggest an acute cholecystitis.  Common bile duct measures up to 6 mm, borderline dilated. Recommend correlation with serologies and if abnormal, MRCP or ERCP could be obtained. This recommendation follows ACR consensus guidelines: White Paper of the ACR Incidental Findings Committee II on Gallbladder and Biliary Findings. J Am Coll Radiol 2013:;10:953-956.      Assessment This is a 45 year old woman who has been having right upper quadrant and epigastric pain for nearly a year.   Her symptoms are consistent with biliary colic and she does have gallstones.  Plan I have offered her a robot-assisted laparoscopic cholecystectomy.I discussed the procedure in detail.  We discussed the risks and benefits of a laparoscopic cholecystectomy and possible cholangiogram including, but not limited to: bleeding, infection, injury to surrounding structures such as the intestine or liver, bile leak, retained gallstones, need to convert to an open procedure, prolonged diarrhea, blood clots such as DVT, common bile duct injury, anesthesia risks, and possible need for additional procedures. The patient had the opportunity to ask any questions and these were answered to her satisfaction.     Fredirick Maudlin 11/21/2020, 4:05 PM

## 2020-11-21 NOTE — Patient Instructions (Addendum)
You have requested to have your gallbladder removed. This will be done at [facility] with Dr. Celine Ahr.  You will most likely be out of work 1-2 weeks for this surgery. You will return after your post-op appointment with a lifting restriction of no more than 10 pounds for approximately 2-3 more weeks.  You will be able to eat anything you would like to following surgery. But, start by eating a bland diet and advance this as tolerated. The Gallbladder diet is below, please go as closely by this diet as possible prior to surgery to avoid any further attacks.  Please see the (blue)pre-care form that you have been given today. Our surgery scheduler will call you to look at surgery dates and to go over information.  If you have any questions, please call our office.  Laparoscopic Cholecystectomy Laparoscopic cholecystectomy is surgery to remove the gallbladder. The gallbladder is located in the upper right part of the abdomen, behind the liver. It is a storage sac for bile, which is produced in the liver. Bile aids in the digestion and absorption of fats. Cholecystectomy is often done for inflammation of the gallbladder (cholecystitis). This condition is usually caused by a buildup of gallstones (cholelithiasis) in the gallbladder. Gallstones can block the flow of bile, and that can result in inflammation and pain. In severe cases, emergency surgery may be required. If emergency surgery is not required, you will have time to prepare for the procedure. Laparoscopic surgery is an alternative to open surgery. Laparoscopic surgery has a shorter recovery time. Your common bile duct may also need to be examined during the procedure. If stones are found in the common bile duct, they may be removed. LET [facility] CARE PROVIDER KNOW ABOUT: Any allergies you have. All medicines you are taking, including vitamins, herbs, eye drops, creams, and over-the-counter medicines. Previous problems you or members of  your family have had with the use of anesthetics. Any blood disorders you have. Previous surgeries you have had.  Any medical conditions you have. RISKS AND COMPLICATIONS Generally, this is a safe procedure. However, problems may occur, including: Infection. Bleeding. Allergic reactions to medicines. Damage to other structures or organs. A stone remaining in the common bile duct. A bile leak from the cyst duct that is clipped when your gallbladder is removed. The need to convert to open surgery, which requires a larger incision in the abdomen. This may be necessary if your surgeon thinks that it is not safe to continue with a laparoscopic procedure. BEFORE THE PROCEDURE Ask your health care provider about: Changing or stopping your regular medicines. This is especially important if you are taking diabetes medicines or blood thinners. Taking medicines such as aspirin and ibuprofen. These medicines can thin your blood. Do not take these medicines before your procedure if your health care provider instructs you not to. Follow instructions from your health care provider about eating or drinking restrictions. Let your health care provider know if you develop a cold or an infection before surgery. Plan to have someone take you home after the procedure. Ask your health care provider how your surgical site will be marked or identified. You may be given antibiotic medicine to help prevent infection. PROCEDURE To reduce your risk of infection: Your health care team will wash or sanitize their hands. Your skin will be washed with soap. An IV tube may be inserted into one of your veins. You will be given a medicine to make you fall asleep (general anesthetic). A  breathing tube will be placed in your mouth. The surgeon will make several small cuts (incisions) in your abdomen. A thin, lighted tube (laparoscope) that has a tiny camera on the end will be inserted through one of the small incisions.  The camera on the laparoscope will send a picture to a TV screen (monitor) in the operating room. This will give the surgeon a good view inside your abdomen. A gas will be pumped into your abdomen. This will expand your abdomen to give the surgeon more room to perform the surgery. Other tools that are needed for the procedure will be inserted through the other incisions. The gallbladder will be removed through one of the incisions. After your gallbladder has been removed, the incisions will be closed with stitches (sutures), staples, or skin glue. Your incisions may be covered with a bandage (dressing). The procedure may vary among health care providers and hospitals. AFTER THE PROCEDURE Your blood pressure, heart rate, breathing rate, and blood oxygen level will be monitored often until the medicines you were given have worn off. You will be given medicines as needed to control your pain.   This information is not intended to replace advice given to you by your health care provider. Make sure you discuss any questions you have with your health care provider.   Document Released: 05/18/2005 Document Revised: 02/06/2015 Document Reviewed: 12/28/2012 Elsevier Interactive Patient Education 2016 [place] Diet for Gallbladder Conditions A low-fat diet can be helpful if you have pancreatitis or a gallbladder condition. With these conditions, your pancreas and gallbladder have trouble digesting fats. A healthy eating plan with less fat will help rest your pancreas and gallbladder and reduce your symptoms. WHAT DO I NEED TO KNOW ABOUT THIS DIET? Eat a low-fat diet. Reduce your fat intake to less than 20-30% of your total daily calories. This is less than 50-60 g of fat per day. Remember that you need some fat in your diet. Ask your dietician what your daily goal should be. Choose nonfat and low-fat healthy foods. Look for the words "nonfat," "low fat," or "fat free." As a guide, look  on the label and choose foods with less than 3 g of fat per serving. Eat only one serving. Avoid alcohol. Do not smoke. If you need help quitting, talk with your health care provider. Eat small frequent meals instead of three large heavy meals. WHAT FOODS CAN I EAT? Grains Include healthy grains and starches such as potatoes, wheat bread, fiber-rich cereal, and brown rice. Choose whole grain options whenever possible. In adults, whole grains should account for 45-65% of your daily calories.  Fruits and Vegetables Eat plenty of fruits and vegetables. Fresh fruits and vegetables add fiber to your diet. Meats and Other Protein Sources Eat lean meat such as chicken and pork. Trim any fat off of meat before cooking it. Eggs, fish, and beans are other sources of protein. In adults, these foods should account for 10-35% of your daily calories. Dairy Choose low-fat milk and dairy options. Dairy includes fat and protein, as well as calcium.  Fats and Oils Limit high-fat foods such as fried foods, sweets, baked goods, sugary drinks.  Other Creamy sauces and condiments, such as mayonnaise, can add extra fat. Think about whether or not you need to use them, or use smaller amounts or low fat options. WHAT FOODS ARE NOT RECOMMENDED? High fat foods, such as: [employer]. Ice cream. [place] toast. Sweet rolls. Pizza. Cheese bread. Foods covered  with batter, butter, creamy sauces, or cheese. Fried foods. Sugary drinks and desserts. Foods that cause gas or bloating   This information is not intended to replace advice given to you by your health care provider. Make sure you discuss any questions you have with your health care provider.   Document Released: 05/23/2013 Document Reviewed: 05/23/2013 Elsevier Interactive Patient Education [employer].

## 2020-11-22 ENCOUNTER — Telehealth: Payer: Self-pay | Admitting: General Surgery

## 2020-11-22 NOTE — Telephone Encounter (Signed)
Patient has been advised of Pre-Admission date/time, COVID Testing date and Surgery date.  Surgery Date: 12/13/20 Preadmission Testing Date: 12/09/20 (phone 8a-1p) Covid Testing Date: Not needed.   -  Patient has been made aware to call [phone], between 1-3:00pm the day before surgery, to find out what time to arrive for surgery.

## 2020-11-28 ENCOUNTER — Encounter: Payer: Self-pay | Admitting: Unknown Physician Specialty

## 2020-11-28 ENCOUNTER — Ambulatory Visit (INDEPENDENT_AMBULATORY_CARE_PROVIDER_SITE_OTHER): Payer: Federal, State, Local not specified - PPO | Admitting: Unknown Physician Specialty

## 2020-11-28 ENCOUNTER — Other Ambulatory Visit: Payer: Self-pay

## 2020-11-28 VITALS — BP 116/78 | HR 94 | Temp 98.7°F | Resp 16 | Ht 65.0 in | Wt 179.8 lb

## 2020-11-28 DIAGNOSIS — R1013 Epigastric pain: Secondary | ICD-10-CM | POA: Diagnosis not present

## 2020-11-28 DIAGNOSIS — H6120 Impacted cerumen, unspecified ear: Secondary | ICD-10-CM | POA: Diagnosis not present

## 2020-11-28 DIAGNOSIS — K838 Other specified diseases of biliary tract: Secondary | ICD-10-CM | POA: Diagnosis not present

## 2020-11-28 DIAGNOSIS — R110 Nausea: Secondary | ICD-10-CM | POA: Diagnosis not present

## 2020-11-28 DIAGNOSIS — Z0000 Encounter for general adult medical examination without abnormal findings: Secondary | ICD-10-CM

## 2020-11-28 DIAGNOSIS — K8020 Calculus of gallbladder without cholecystitis without obstruction: Secondary | ICD-10-CM | POA: Diagnosis not present

## 2020-11-28 NOTE — Progress Notes (Signed)
BP 116/78   Pulse 94   Temp 98.7 F (37.1 C)   Resp 16   Ht 5\' 5"  (1.651 m)   Wt 179 lb 12.8 oz (81.6 kg)   LMP 07/09/2015 (Exact Date)   SpO2 98%   BMI 29.92 kg/m    Subjective:    Patient ID: Stacey Stanley, female    DOB: 1975/06/03, 45 y.o.   MRN: [id number]  HPI: Stacey Stanley is a 45 y.o. female  Under to care of an Ob-Gyn and will get paps and breast exam done.  Seeing Dr. Jabier Mutton for pain management.   Surgery forGall stones soon.  Planning on coming here for pre-op exam.   Depression screen [facility] 2/9 11/28/2020 11/11/2020 07/08/2020 05/06/2020 04/24/2020  Decreased Interest 0 0 0 0 0  Down, Depressed, Hopeless 0 0 1 0 0  PHQ - 2 Score 0 0 1 0 0  Altered sleeping 0 1 - 1 1  Tired, decreased energy 0 1 - 0 1  Change in appetite 0 0 - 0 0  Feeling bad or failure about yourself  0 0 - 0 0  Trouble concentrating 0 0 - 0 0  Moving slowly or fidgety/restless 0 0 - 0 0  Suicidal thoughts - 0 - 0 0  PHQ-9 Score 0 2 - 1 2  Difficult doing work/chores Not difficult at all Not difficult at all - Not difficult at all Not difficult at all  Some recent data might be hidden     Chief Complaint  Patient presents with   Annual Exam    See's gn for mammograms and needs CPE from filled out   Social History   Socioeconomic History   Marital status: Married    Spouse name: Elberta Fortis   Number of children: 2   Years of education: 16   Highest education level: Bachelor's degree (e.g., BA, AB, BS)  Occupational History   Not on file  Tobacco Use   Smoking status: Every Day    Packs/day: 0.50    Years: 25.00    Pack years: 12.50    Types: Cigarettes    Start date: 11/25/1993    Last attempt to quit: 09/29/2020    Years since quitting: 0.1   Smokeless tobacco: Never  Vaping Use   Vaping Use: Never used  Substance and Sexual Activity   Alcohol use: No    Alcohol/week: 0.0 standard drinks   Drug use: No   Sexual activity: Not on file    Comment: Hysterectomy  Other  Topics Concern   Not on file  Social History Narrative   Married for the past 3 years.    Both had children previously.    They work different [employer]   She works full time at the [employer] and is going to resume NP school Fall 2020   Social Determinants of Radio broadcast assistant Strain: Low Risk    Difficulty of Paying Living Expenses: Not hard at all  Food Insecurity: No Food Insecurity   Worried About Charity fundraiser in the Last Year: Never true   Arboriculturist in the Last Year: Never true  Transportation Needs: No Transportation Needs   Lack of Transportation (Medical): No   Lack of Transportation (Non-Medical): No  Physical Activity: Insufficiently Active   Days of Exercise per Week: 3 days   Minutes of Exercise per Session: 30 min  Stress: No Stress Concern Present   Feeling of Stress :  Not at all  Social Connections: Moderately Isolated   Frequency of Communication with Friends and Family: More than three times a week   Frequency of Social Gatherings with Friends and Family: Never   Attends Religious Services: Never   Marine scientist or Organizations: No   Attends Music therapist: Never   Marital Status: Married  Human resources officer Violence: Not At Risk   Fear of Current or Ex-Partner: No   Emotionally Abused: No   Physically Abused: No   Sexually Abused: No   Family History  Problem Relation Age of Onset   Diabetes Mother    Hypertension Mother    Hypertension Father    Kidney disease Father    Stroke Maternal Grandmother    Leukemia Paternal Grandmother    Alzheimer's disease Paternal Grandfather    Heart disease Brother    Anesthesia problems Neg Hx    Hypotension Neg Hx    Malignant hyperthermia Neg Hx    Pseudochol deficiency Neg Hx    Past Medical History:  Diagnosis Date   Anxiety    no current meds   Arthritis    left knee pain - otc med prn   Chronic insomnia    Depression    Diabetes mellitus without complication ([place])     Dysrhythmia    Home with daughter Stacey Stanley   Endometriosis    GERD (gastroesophageal reflux disease)    diet control - no meds   Gout    wrist, feet, hands bilateral   Headache(784.0)    hx - last one 2 yrs ago - no meds   Hypertension    Kidney stones    Metabolic syndrome    Obesity    Serum calcium elevated    Sleep disorder, circadian, shift work type    SVD (spontaneous vaginal delivery)    x 2   Tachycardia    History - neg cardiac tests-stress related   Past Surgical History:  Procedure Laterality Date   ABDOMINAL HYSTERECTOMY  05/21/2016   DILATION AND CURETTAGE OF UTERUS     EYE SURGERY  06/28/13   right laser eye surgery - repair retina   HEMORROIDECTOMY  04/04/2018   Dr. Levin Bacon    HYSTEROSCOPY WITH D & C N/A 07/19/2013   Procedure: DILATATION AND CURETTAGE [place];  Surgeon: Marvene Staff, MD;  Location: [facility];  Service: Gynecology;  Laterality: N/A;   INDUCED ABORTION     LAPAROSCOPY  1992   endometriosis   LAPAROSCOPY N/A 07/19/2013   Procedure: LAPAROSCOPY DIAGNOSTIC  with resection of endometriosis;  Surgeon: Marvene Staff, MD;  Location: [facility];  Service: Gynecology;  Laterality: N/A;   metatarsil   2003   left foot surgery      Relevant past medical, surgical, family and social history reviewed and updated as indicated. Interim medical history since our last visit reviewed. Allergies and medications reviewed and updated.  Review of Systems  Constitutional: Negative.   HENT: Negative.    Respiratory: Negative.    Cardiovascular: Negative.   Gastrointestinal:        Choleylithesis.  Epigastric pain related  Genitourinary:        Chronic pelvic pain  Psychiatric/Behavioral: Negative.     Per HPI unless specifically indicated above     Objective:    BP 116/78   Pulse 94   Temp 98.7 F (37.1 C)   Resp 16   Ht 5\' 5"  (1.651 m)   Wt 179  lb 12.8 oz (81.6 kg)   LMP 07/09/2015 (Exact Date)   SpO2 98%   BMI 29.92  kg/m   Wt Readings from Last 3 Encounters:  11/28/20 179 lb 12.8 oz (81.6 kg)  11/21/20 179 lb (81.2 kg)  11/12/20 170 lb (77.1 kg)    Physical Exam Constitutional:      General: She is not in acute distress.    Appearance: Normal appearance. She is well-developed. She is not ill-appearing.  HENT:     Head: Normocephalic and atraumatic.     Right Ear: Tympanic membrane normal.     Left Ear: Tympanic membrane normal.     Nose: Nose normal.     Mouth/Throat:     Mouth: Mucous membranes are dry.  Eyes:     General: No scleral icterus.       Right eye: No discharge.        Left eye: No discharge.     Pupils: Pupils are equal, round, and reactive to light.  Neck:     Thyroid: No thyromegaly.     Vascular: No carotid bruit.  Cardiovascular:     Rate and Rhythm: Normal rate and regular rhythm.     Heart sounds: Normal heart sounds. No murmur heard.   No friction rub. No gallop.  Pulmonary:     Effort: Pulmonary effort is normal. No respiratory distress.     Breath sounds: Normal breath sounds. No wheezing or rales.  Chest:  Breasts:    Right: Normal. No mass, nipple discharge or skin change.     Left: Normal. No mass, nipple discharge or skin change.  Abdominal:     General: Bowel sounds are normal.     Palpations: Abdomen is soft.     Tenderness: There is no abdominal tenderness. There is no right CVA tenderness, left CVA tenderness or rebound.  Musculoskeletal:        General: Normal range of motion.     Cervical back: Normal range of motion and neck supple.  Lymphadenopathy:     Cervical: No cervical adenopathy.  Skin:    General: Skin is warm and dry.     Findings: No rash.  Neurological:     Mental Status: She is alert and oriented to person, place, and time.  Psychiatric:        Speech: Speech normal.        Behavior: Behavior normal.        Thought Content: Thought content normal.        Judgment: Judgment normal.    Results for orders placed or performed  during the hospital encounter of 11/12/20  Lipase, blood  Result Value Ref Range   Lipase 22 11 - 51 U/L  Comprehensive metabolic panel  Result Value Ref Range   Sodium 139 135 - 145 mmol/L   Potassium 3.9 3.5 - 5.1 mmol/L   Chloride 105 98 - 111 mmol/L   CO2 26 22 - 32 mmol/L   Glucose, Bld 97 70 - 99 mg/dL   BUN 9 6 - 20 mg/dL   Creatinine, Ser 0.84 0.44 - 1.00 mg/dL   Calcium 10.1 8.9 - 10.3 mg/dL   Total Protein 6.8 6.5 - 8.1 g/dL   Albumin 4.3 3.5 - 5.0 g/dL   AST 11 (L) 15 - 41 U/L   ALT 7 0 - 44 U/L   Alkaline Phosphatase 46 38 - 126 U/L   Total Bilirubin 0.7 0.3 - 1.2 mg/dL   GFR, Estimated >60 >  60 mL/min   Anion gap 8 5 - 15  CBC  Result Value Ref Range   WBC 6.3 4.0 - 10.5 K/uL   RBC 4.77 3.87 - 5.11 MIL/uL   Hemoglobin 13.8 12.0 - 15.0 g/dL   HCT 41.3 36.0 - 46.0 %   MCV 86.6 80.0 - 100.0 fL   MCH 28.9 26.0 - 34.0 pg   MCHC 33.4 30.0 - 36.0 g/dL   RDW 12.6 11.5 - 15.5 %   Platelets 279 150 - 400 K/uL   nRBC 0.0 0.0 - 0.2 %  Urinalysis, Routine w reflex microscopic Urine, Clean Catch  Result Value Ref Range   Color, Urine YELLOW YELLOW   APPearance CLEAR CLEAR   Specific Gravity, Urine 1.016 1.005 - 1.030   pH 6.0 5.0 - 8.0   Glucose, UA NEGATIVE NEGATIVE mg/dL   Hgb urine dipstick TRACE (A) NEGATIVE   Bilirubin Urine NEGATIVE NEGATIVE   Ketones, ur NEGATIVE NEGATIVE mg/dL   Protein, ur NEGATIVE NEGATIVE mg/dL   Nitrite NEGATIVE NEGATIVE   Leukocytes,Ua NEGATIVE NEGATIVE   RBC / HPF 0-5 0 - 5 RBC/hpf   WBC, UA 0-5 0 - 5 WBC/hpf   Squamous Epithelial / LPF 0-5 0 - 5   Mucus PRESENT       Assessment & Plan:   Problem List Items Addressed This Visit   None Visit Diagnoses     Routine general medical examination at a health care facility    -  Primary   Relevant Orders   CBC with Differential/Platelet   COMPLETE METABOLIC PANEL WITH GFR   Comprehensive metabolic panel   Hemoglobin A1c   Lipid panel   TSH        Follow up plan: Return in  about 6 weeks (around 01/09/2021).

## 2020-11-28 NOTE — Progress Notes (Signed)
Name: Stacey Stanley   MRN: [id number]    DOB: May 05, 1976   Date:11/29/2020       Progress Note  Subjective  Chief Complaint  Disability Paperwork  HPI  Cholelithiasis: she has been out of work since June 14 th when she went to [facility] with severe epigastric pain, diagnosed with cholelithiasis and referred to Psychologist, sport and exercise. She states she has constant nausea, pain in epigastric area is worse at night and affects her sleep, having episodes of diarrhea, she does not think she can return to work until surgery is done on July 15th, 2022 . She needs forms to be filled out so she can get paid for her absence. She did not get MRCP or ERCP, she states tried to have surgery sooner but no availability . Pain radiates from epigastric area to her back, continues to have nausea, discussed adding lipase and CT abdomen and she agrees   IMPRESSION: Cholelithiasis with the gallbladder contracted around numerous echogenic, shadowing gallstones. No significant gallbladder wall thickening or positive sonographic Murphy sign to suggest an acute cholecystitis.   Common bile duct measures up to 6 mm, borderline dilated. Recommend correlation with serologies and if abnormal, MRCP or ERCP could be obtained. This recommendation follows ACR consensus guidelines: White Paper of the ACR Incidental Findings Committee II on Gallbladder and Biliary Findings. J Am Coll Radiol 2013:;10:953-956.    Patient Active Problem List   Diagnosis Date Noted   Constipation 11/11/2020   Vomiting without nausea 11/11/2020   Gastroesophageal reflux disease 11/11/2020   Chronic pain syndrome 12/28/2017   Opiate use 12/28/2017   Long term prescription benzodiazepine use 12/28/2017   Thrombocytosis 10/26/2017   Chronic idiopathic constipation 10/13/2017   Pelvic pain 09/09/2017   Mass of left ovary 06/28/2017   Kidney stone on left side 06/28/2017   Cervical radiculitis 03/08/2017   Vitamin B12 deficiency 08/19/2016   Prediabetes  08/19/2016   Right thyroid nodule 07/31/2015   Chronic pelvic pain in female 04/05/2015   GERD (gastroesophageal reflux disease) 04/05/2015   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 Levin Bacon    HYSTEROSCOPY WITH D & C N/A 07/19/2013   Procedure: DILATATION AND CURETTAGE [place];  Surgeon: Marvene Staff, MD;  Location: [facility];  Service: Gynecology;  Laterality: N/A;   INDUCED ABORTION     LAPAROSCOPY  1992   endometriosis   LAPAROSCOPY N/A 07/19/2013   Procedure: LAPAROSCOPY DIAGNOSTIC  with resection of endometriosis;  Surgeon: Marvene Staff, MD;  Location: [facility];  Service: Gynecology;  Laterality: N/A;   metatarsil   2003   left foot surgery     Family History  Problem Relation Age of Onset   Diabetes Mother    Hypertension Mother    Hypertension Father    Kidney disease Father    Stroke Maternal Grandmother    Leukemia Paternal Grandmother    Alzheimer's disease Paternal Grandfather    Heart disease Brother    Anesthesia problems Neg Hx    Hypotension Neg Hx    Malignant hyperthermia Neg Hx    Pseudochol deficiency Neg Hx  Social History   Tobacco Use   Smoking status: Every Day    Packs/day: 0.50    Years: 25.00    Pack years: 12.50    Types: Cigarettes    Start date: 11/25/1993    Last attempt to quit: 09/29/2020    Years since quitting: 0.1   Smokeless tobacco: Never  Substance Use Topics   Alcohol use: No    Alcohol/week: 0.0 standard drinks     Current Outpatient Medications:    Dapsone 5 % topical gel, Apply 60 g topically daily., Disp: , Rfl:     hydrochlorothiazide (HYDRODIURIL) 12.5 MG tablet, Take 1 tablet (12.5 mg total) by mouth daily., Disp: 90 tablet, Rfl: 1   ibuprofen (ADVIL,MOTRIN) 800 MG tablet, Take 800 mg by mouth every 8 (eight) hours as needed., Disp: , Rfl:    imiquimod (ALDARA) 5 % cream, Apply 1 packet topically daily., Disp: , Rfl:    metFORMIN (GLUCOPHAGE-XR) 750 MG 24 hr tablet, Take 1 tablet (750 mg total) by mouth daily with breakfast., Disp: 90 tablet, Rfl: 1   Omega-3 Fatty Acids (FISH OIL) 1000 MG CAPS, Take 4,000 mg by mouth daily., Disp: , Rfl:    promethazine (PHENERGAN) 25 MG tablet, Take 0.5-1 tablets (12.5-25 mg total) by mouth every 6 (six) hours as needed for nausea or vomiting., Disp: 30 tablet, Rfl: 0   Semaglutide, 1 MG/DOSE, (OZEMPIC, 1 MG/DOSE,) 2 MG/1.5ML SOPN, Inject 1 mg into the skin once a week., Disp: 27 mL, Rfl: 1   tapentadol HCl (NUCYNTA) 75 MG tablet, Take 1 tablet (75 mg total) by mouth 2 (two) times daily as needed., Disp: 45 tablet, Rfl: 0   [START ON 12/22/2020] tapentadol HCl (NUCYNTA) 75 MG tablet, Take 1 tablet (75 mg total) by mouth 2 (two) times daily as needed., Disp: 45 tablet, Rfl: 0   TRI-LUMA 0.01-4-0.05 % CREA, Apply 30 mg topically daily., Disp: , Rfl:   Allergies  Allergen Reactions   Nuvigil [Armodafinil]     Chest pain     I personally reviewed active problem list, medication list, allergies, family history with the patient/caregiver today.   ROS  Ten systems reviewed and is negative except as mentioned in HPI, she has chronic pelvic pain   Objective  Vitals:   11/29/20 1014  BP: 124/76  Pulse: 92  Resp: 16  Temp: 98.9 F (37.2 C)  TempSrc: Oral  SpO2: 99%  Weight: 177 lb (80.3 kg)  Height: 5\' 5"  (1.651 m)    Body mass index is 29.45 kg/m.  Physical Exam  Constitutional: Patient appears well-developed and well-nourished. Obese  No distress.  HEENT: head atraumatic, normocephalic, pupils equal and reactive to light, neck supple Cardiovascular:  Normal rate, regular rhythm and normal heart sounds.  No murmur heard. No BLE edema. Pulmonary/Chest: Effort normal and breath sounds normal. No respiratory distress. Abdominal: Soft.  There is epigastric  tenderness., some LLQ pain ( likely from chronic pelvic pain, normal bowel sounds  Psychiatric: Patient has a normal mood and affect. behavior is normal. Judgment and thought content normal.   Recent Results (from the past 2160 hour(s))  Lipase, blood     Status: None   Collection Time: 11/12/20  3:55 AM  Result Value Ref Range   Lipase 22 11 - 51 U/L    Comment: Performed at [facility], [address], [place], [place] [address]  Comprehensive metabolic panel     Status: Abnormal   Collection Time: 11/12/20  3:55 AM  Result Value Ref Range   Sodium 139 135 - 145 mmol/L   Potassium 3.9 3.5 - 5.1 mmol/L   Chloride 105 98 - 111 mmol/L   CO2 26 22 - 32 mmol/L   Glucose, Bld 97 70 - 99 mg/dL    Comment: Glucose reference range applies only to samples taken after fasting for at least 8 hours.   BUN 9 6 - 20 mg/dL   Creatinine, Ser 0.84 0.44 - 1.00 mg/dL   Calcium 10.1 8.9 - 10.3 mg/dL   Total Protein 6.8 6.5 - 8.1 g/dL   Albumin 4.3 3.5 - 5.0 g/dL   AST 11 (L) 15 - 41 U/L   ALT 7 0 - 44 U/L   Alkaline Phosphatase 46 38 - 126 U/L   Total Bilirubin 0.7 0.3 - 1.2 mg/dL   GFR, Estimated >60 >60 mL/min    Comment: (NOTE) Calculated using the CKD-EPI Creatinine Equation (2021)    Anion gap 8 5 - 15    Comment: Performed at [facility], [address] [place], [place] [address]  CBC     Status: None   Collection Time: 11/12/20  3:55 AM  Result Value Ref Range   WBC 6.3 4.0 - 10.5 K/uL   RBC 4.77 3.87 - 5.11 MIL/uL   Hemoglobin 13.8 12.0 - 15.0 g/dL   HCT 41.3 36.0 - 46.0 %   MCV 86.6 80.0 - 100.0 fL   MCH 28.9 26.0 - 34.0 pg   MCHC 33.4 30.0 - 36.0 g/dL   RDW 12.6 11.5 - 15.5 %   Platelets 279 150 - 400 K/uL   nRBC 0.0 0.0 - 0.2 %     Comment: Performed at [facility], [address] [place], [place] [address]  Urinalysis, Routine w reflex microscopic Urine, Clean Catch     Status: Abnormal   Collection Time: 11/12/20  3:55 AM  Result Value Ref Range   Color, Urine YELLOW YELLOW   APPearance CLEAR CLEAR   Specific Gravity, Urine 1.016 1.005 - 1.030   pH 6.0 5.0 - 8.0   Glucose, UA NEGATIVE NEGATIVE mg/dL   Hgb urine dipstick TRACE (A) NEGATIVE   Bilirubin Urine NEGATIVE NEGATIVE   Ketones, ur NEGATIVE NEGATIVE mg/dL   Protein, ur NEGATIVE NEGATIVE mg/dL   Nitrite NEGATIVE NEGATIVE   Leukocytes,Ua NEGATIVE NEGATIVE   RBC / HPF 0-5 0 - 5 RBC/hpf   WBC, UA 0-5 0 - 5 WBC/hpf   Squamous Epithelial / LPF 0-5 0 - 5   Mucus PRESENT     Comment: Performed at [facility], [address] [place], [place] [address]  CBC with Differential/Platelet     Status: None   Collection Time: 11/28/20 10:40 AM  Result Value Ref Range   WBC 6.1 3.8 - 10.8 Thousand/uL   RBC 5.02 3.80 - 5.10 Million/uL   Hemoglobin 14.5 11.7 - 15.5 g/dL   HCT 44.3 35.0 - 45.0 %   MCV 88.2 80.0 - 100.0 fL   MCH 28.9 27.0 - 33.0 pg   MCHC 32.7 32.0 - 36.0 g/dL   RDW 13.0 11.0 - 15.0 %   Platelets 271 140 - 400 Thousand/uL   MPV 9.3 7.5 - 12.5 fL   Neutro Abs 2,989 1,500 - 7,800 cells/uL   Lymphs Abs 2,611 850 - 3,900 cells/uL   Absolute Monocytes 360 200 - 950 cells/uL   Eosinophils Absolute 110 15 - 500 cells/uL   Basophils Absolute 31 0 - 200 cells/uL  Neutrophils Relative % 49 %   Total Lymphocyte 42.8 %   Monocytes Relative 5.9 %   Eosinophils Relative 1.8 %   Basophils Relative 0.5 %  COMPLETE METABOLIC PANEL WITH GFR     Status: Abnormal   Collection Time: 11/28/20 10:40 AM  Result Value Ref Range   Glucose, Bld 90 65 - 99 mg/dL    Comment: .            Fasting reference interval .    BUN 10 7 - 25 mg/dL   Creat 0.76 0.50 - 1.10 mg/dL   GFR, Est Non African American 93 > OR = 60  mL/min/1.28m2   GFR, Est African American 108 > OR = 60 mL/min/1.76m2   BUN/Creatinine Ratio NOT APPLICABLE 6 - 22 (calc)   Sodium 139 135 - 146 mmol/L   Potassium 3.9 3.5 - 5.3 mmol/L   Chloride 105 98 - 110 mmol/L   CO2 27 20 - 32 mmol/L   Calcium 10.3 (H) 8.6 - 10.2 mg/dL   Total Protein 6.8 6.1 - 8.1 g/dL   Albumin 4.4 3.6 - 5.1 g/dL   Globulin 2.4 1.9 - 3.7 g/dL (calc)   AG Ratio 1.8 1.0 - 2.5 (calc)   Total Bilirubin 0.7 0.2 - 1.2 mg/dL   Alkaline phosphatase (APISO) 51 31 - 125 U/L   AST 13 10 - 35 U/L   ALT 11 6 - 29 U/L  Hemoglobin A1c     Status: None   Collection Time: 11/28/20 10:40 AM  Result Value Ref Range   Hgb A1c MFr Bld 5.4 <5.7 % of total Hgb    Comment: For the purpose of screening for the presence of diabetes: . <5.7%       Consistent with the absence of diabetes 5.7-6.4%    Consistent with increased risk for diabetes             (prediabetes) > or =6.5%  Consistent with diabetes . This assay result is consistent with a decreased risk of diabetes. . Currently, no consensus exists regarding use of hemoglobin A1c for diagnosis of diabetes in children. . According to American Diabetes Association (ADA) guidelines, hemoglobin A1c <7.0% represents optimal control in non-pregnant diabetic patients. Different metrics may apply to specific patient populations.  Standards of Medical Care in Diabetes(ADA). .    Mean Plasma Glucose 108 mg/dL   eAG (mmol/L) 6.0 mmol/L  Lipid panel     Status: None   Collection Time: 11/28/20 10:40 AM  Result Value Ref Range   Cholesterol 160 <200 mg/dL   HDL 72 > OR = 50 mg/dL   Triglycerides 87 <150 mg/dL   LDL Cholesterol (Calc) 71 mg/dL (calc)    Comment: Reference range: <100 . Desirable range <100 mg/dL for primary prevention;   <70 mg/dL for patients with CHD or diabetic patients  with > or = 2 CHD risk factors. Marland Kitchen LDL-C is now calculated using the Martin-Hopkins  calculation, which is a validated novel method  providing  better accuracy than the Friedewald equation in the  estimation of LDL-C.  Cresenciano Genre et al. Annamaria Helling. 9622;297(98): 2061-2068  (http://education.[facility].com/faq/FAQ164)    Total CHOL/HDL Ratio 2.2 <5.0 (calc)   Non-HDL Cholesterol (Calc) 88 <130 mg/dL (calc)    Comment: For patients with diabetes plus 1 major ASCVD risk  factor, treating to a non-HDL-C goal of <100 mg/dL  (LDL-C of <70 mg/dL) is considered a therapeutic  option.   TSH     Status: None  Collection Time: 11/28/20 10:40 AM  Result Value Ref Range   TSH 1.19 mIU/L    Comment:           Reference Range .           > or = 20 Years  0.40-4.50 .                Pregnancy Ranges           First trimester    0.26-2.66           Second trimester   0.55-2.73           Third trimester    0.43-2.91      PHQ2/9: Depression screen [facility] 2/9 11/29/2020 11/28/2020 11/11/2020 07/08/2020 05/06/2020  Decreased Interest 0 0 0 0 0  Down, Depressed, Hopeless 0 0 0 1 0  PHQ - 2 Score 0 0 0 1 0  Altered sleeping - 0 1 - 1  Tired, decreased energy - 0 1 - 0  Change in appetite - 0 0 - 0  Feeling bad or failure about yourself  - 0 0 - 0  Trouble concentrating - 0 0 - 0  Moving slowly or fidgety/restless - 0 0 - 0  Suicidal thoughts - - 0 - 0  PHQ-9 Score - 0 2 - 1  Difficult doing work/chores - Not difficult at all Not difficult at all - Not difficult at all  Some recent data might be hidden    phq 9 is negative   Fall Risk: Fall Risk  11/29/2020 11/28/2020 11/11/2020 10/01/2020 07/08/2020  Falls in the past year? 0 0 0 0 0  Number falls in past yr: 0 0 0 - 0  Injury with Fall? 0 0 0 - 0  Follow up - - Falls prevention discussed - -      Functional Status Survey: Is the patient deaf or have difficulty hearing?: No Does the patient have difficulty seeing, even when wearing glasses/contacts?: No Does the patient have difficulty concentrating, remembering, or making decisions?: No Does the patient have difficulty  walking or climbing stairs?: No Does the patient have difficulty dressing or bathing?: No Does the patient have difficulty doing errands alone such as visiting a doctor's office or shopping?: No    Assessment & Plan  1. Calculus of gallbladder without cholecystitis without obstruction 1. Calculus of gallbladder without cholecystitis without obstruction  - Lipase - CT PANCREAS ABD W/WO; Future  2. Nausea  - Lipase - CT PANCREAS ABD W/WO; Future  3. Common bile duct dilation  - Lipase - CT PANCREAS ABD W/WO; Future  4. Epigastric pain  - Lipase - CT PANCREAS ABD W/WO; Future

## 2020-11-28 NOTE — Addendum Note (Signed)
Addended by: Docia Furl on: 11/28/2020 10:43 AM   Modules accepted: Orders

## 2020-11-29 ENCOUNTER — Encounter: Payer: Self-pay | Admitting: Family Medicine

## 2020-11-29 ENCOUNTER — Ambulatory Visit: Payer: Federal, State, Local not specified - PPO | Admitting: Family Medicine

## 2020-11-29 VITALS — BP 124/76 | HR 92 | Temp 98.9°F | Resp 16 | Ht 65.0 in | Wt 177.0 lb

## 2020-11-29 DIAGNOSIS — K8020 Calculus of gallbladder without cholecystitis without obstruction: Secondary | ICD-10-CM

## 2020-11-29 DIAGNOSIS — R110 Nausea: Secondary | ICD-10-CM | POA: Diagnosis not present

## 2020-11-29 DIAGNOSIS — K838 Other specified diseases of biliary tract: Secondary | ICD-10-CM

## 2020-11-29 DIAGNOSIS — R1013 Epigastric pain: Secondary | ICD-10-CM | POA: Diagnosis not present

## 2020-11-30 LAB — CBC WITH DIFFERENTIAL/PLATELET
Absolute Monocytes: 360 cells/uL (ref 200–950)
Basophils Absolute: 31 cells/uL (ref 0–200)
Basophils Relative: 0.5 %
Eosinophils Absolute: 110 cells/uL (ref 15–500)
Eosinophils Relative: 1.8 %
HCT: 44.3 % (ref 35.0–45.0)
Hemoglobin: 14.5 g/dL (ref 11.7–15.5)
Lymphs Abs: 2611 cells/uL (ref 850–3900)
MCH: 28.9 pg (ref 27.0–33.0)
MCHC: 32.7 g/dL (ref 32.0–36.0)
MCV: 88.2 fL (ref 80.0–100.0)
MPV: 9.3 fL (ref 7.5–12.5)
Monocytes Relative: 5.9 %
Neutro Abs: 2989 cells/uL (ref 1500–7800)
Neutrophils Relative %: 49 %
Platelets: 271 10*3/uL (ref 140–400)
RBC: 5.02 10*6/uL (ref 3.80–5.10)
RDW: 13 % (ref 11.0–15.0)
Total Lymphocyte: 42.8 %
WBC: 6.1 10*3/uL (ref 3.8–10.8)

## 2020-11-30 LAB — LIPID PANEL
Cholesterol: 160 mg/dL (ref <200)
HDL: 72 mg/dL (ref >=50)
LDL Cholesterol (Calc): 71 mg/dL (calc)
Non-HDL Cholesterol (Calc): 88 mg/dL (calc) (ref <130)
Total CHOL/HDL Ratio: 2.2 (calc) (ref <5.0)
Triglycerides: 87 mg/dL (ref <150)

## 2020-11-30 LAB — COMPLETE METABOLIC PANEL WITH GFR
AG Ratio: 1.8 (calc) (ref 1.0–2.5)
ALT: 11 U/L (ref 6–29)
AST: 13 U/L (ref 10–35)
Albumin: 4.4 g/dL (ref 3.6–5.1)
Alkaline phosphatase (APISO): 51 U/L (ref 31–125)
BUN: 10 mg/dL (ref 7–25)
CO2: 27 mmol/L (ref 20–32)
Calcium: 10.3 mg/dL — ABNORMAL HIGH (ref 8.6–10.2)
Chloride: 105 mmol/L (ref 98–110)
Creat: 0.76 mg/dL (ref 0.50–1.10)
GFR, Est African American: 108 mL/min/{1.73_m2} (ref >=60)
GFR, Est Non African American: 93 mL/min/{1.73_m2} (ref >=60)
Globulin: 2.4 g/dL (calc) (ref 1.9–3.7)
Glucose, Bld: 90 mg/dL (ref 65–99)
Potassium: 3.9 mmol/L (ref 3.5–5.3)
Sodium: 139 mmol/L (ref 135–146)
Total Bilirubin: 0.7 mg/dL (ref 0.2–1.2)
Total Protein: 6.8 g/dL (ref 6.1–8.1)

## 2020-11-30 LAB — TEST AUTHORIZATION

## 2020-11-30 LAB — HEMOGLOBIN A1C
Hgb A1c MFr Bld: 5.4 % of total Hgb (ref <5.7)
Mean Plasma Glucose: 108 mg/dL
eAG (mmol/L): 6 mmol/L

## 2020-11-30 LAB — TSH: TSH: 1.19 mIU/L

## 2020-11-30 LAB — LIPASE: Lipase: 23 U/L (ref 7–60)

## 2020-12-04 DIAGNOSIS — N941 Unspecified dyspareunia: Secondary | ICD-10-CM | POA: Diagnosis not present

## 2020-12-04 DIAGNOSIS — Z90710 Acquired absence of both cervix and uterus: Secondary | ICD-10-CM | POA: Diagnosis not present

## 2020-12-04 DIAGNOSIS — Z01419 Encounter for gynecological examination (general) (routine) without abnormal findings: Secondary | ICD-10-CM | POA: Diagnosis not present

## 2020-12-04 DIAGNOSIS — Z1231 Encounter for screening mammogram for malignant neoplasm of breast: Secondary | ICD-10-CM | POA: Diagnosis not present

## 2020-12-06 DIAGNOSIS — Z111 Encounter for screening for respiratory tuberculosis: Secondary | ICD-10-CM | POA: Diagnosis not present

## 2020-12-09 ENCOUNTER — Other Ambulatory Visit
Admission: RE | Admit: 2020-12-09 | Discharge: 2020-12-09 | Disposition: A | Discharge status: Home / Self Care | Payer: Federal, State, Local not specified - PPO | Source: Ambulatory Visit | Attending: General Surgery | Admitting: General Surgery

## 2020-12-09 ENCOUNTER — Other Ambulatory Visit: Payer: Self-pay

## 2020-12-09 HISTORY — DX: Personal history of urinary calculi: Z87.442

## 2020-12-09 NOTE — Patient Instructions (Addendum)
Your procedure is scheduled on: Friday, July 15 Report to the Registration Desk on the 1st floor of the [facility]. To find out your arrival time, please call [phone] between 1PM - 3PM on: Thursday, July 14  REMEMBER: Instructions that are not followed completely may result in serious medical risk, up to and including death; or upon the discretion of your surgeon and anesthesiologist your surgery may need to be rescheduled.  Do not eat or drink anything after midnight the night before surgery.  No gum chewing, lozengers or hard candies.  DO NOT TAKE ANY MEDICATIONS THE MORNING OF SURGERY  Stop Metformin 2 days prior to surgery. LAST DAY TO TAKE METFORMIN IS Tuesday, July 12. Resume AFTER surgery.  One week prior to surgery: starting today, July 11 Stop Anti-inflammatories (NSAIDS) such as Advil, Aleve, Ibuprofen, Motrin, Naproxen, Naprosyn and Aspirin based products such as Excedrin, Goodys Powder, BC Powder. Stop ANY OVER THE COUNTER supplements until after surgery. You may however, continue to take Tylenol if needed for pain up until the day of surgery.  No Alcohol for 24 hours before or after surgery.  No Smoking including e-cigarettes for 24 hours prior to surgery.  No chewable tobacco products for at least 6 hours prior to surgery.  No nicotine patches on the day of surgery.  Do not use any "recreational" drugs for at least a week prior to your surgery.  Please be advised that the combination of cocaine and anesthesia may have negative outcomes, up to and including death. If you test positive for cocaine, your surgery will be cancelled.  On the morning of surgery brush your teeth with toothpaste and water, you may rinse your mouth with mouthwash if you wish. Do not swallow any toothpaste or mouthwash.  Do not wear jewelry, make-up, hairpins, clips or nail polish.  Do not wear lotions, powders, or perfumes.   Do not shave body from the neck down 48 hours prior to  surgery just in case you cut yourself which could leave a site for infection.  Also, freshly shaved skin may become irritated if using the CHG soap.  Contact lenses, hearing aids and dentures may not be worn into surgery.  Do not bring valuables to the hospital. [facility] is not responsible for any missing/lost belongings or valuables.   Use CHG Soap as directed on instruction sheet.  Notify your doctor if there is any change in your medical condition (cold, fever, infection).  Wear comfortable clothing (specific to your surgery type) to the hospital.  After surgery, you can help prevent lung complications by doing breathing exercises.  Take deep breaths and cough every 1-2 hours. Your doctor may order a device called an Incentive Spirometer to help you take deep breaths. When coughing or sneezing, hold a pillow firmly against your incision with both hands. This is called "splinting." Doing this helps protect your incision. It also decreases belly discomfort.  If you are being discharged the day of surgery, you will not be allowed to drive home. You will need a responsible adult (18 years or older) to drive you home and stay with you that night.   If you are taking public transportation, you will need to have a responsible adult (18 years or older) with you. Please confirm with your physician that it is acceptable to use public transportation.   Please call the [facility] at [phone] if you have any questions about these instructions.  Surgery Visitation Policy:  Patients undergoing  a surgery or procedure may have one family member or support person with them as long as that person is not COVID-19 positive or experiencing its symptoms.  That person may remain in the waiting area during the procedure.

## 2020-12-10 ENCOUNTER — Encounter
Admission: RE | Admit: 2020-12-10 | Discharge: 2020-12-10 | Disposition: A | Discharge status: Home / Self Care | Payer: Federal, State, Local not specified - PPO | Source: Ambulatory Visit | Attending: General Surgery | Admitting: General Surgery

## 2020-12-10 ENCOUNTER — Inpatient Hospital Stay: Admission: RE | Admit: 2020-12-10 | Payer: Federal, State, Local not specified - PPO | Source: Ambulatory Visit

## 2020-12-10 DIAGNOSIS — Z7984 Long term (current) use of oral hypoglycemic drugs: Secondary | ICD-10-CM | POA: Diagnosis not present

## 2020-12-10 DIAGNOSIS — Z01810 Encounter for preprocedural cardiovascular examination: Secondary | ICD-10-CM | POA: Insufficient documentation

## 2020-12-10 DIAGNOSIS — K8020 Calculus of gallbladder without cholecystitis without obstruction: Secondary | ICD-10-CM | POA: Diagnosis not present

## 2020-12-10 DIAGNOSIS — K8010 Calculus of gallbladder with chronic cholecystitis without obstruction: Secondary | ICD-10-CM | POA: Diagnosis not present

## 2020-12-10 DIAGNOSIS — F17210 Nicotine dependence, cigarettes, uncomplicated: Secondary | ICD-10-CM | POA: Diagnosis not present

## 2020-12-13 ENCOUNTER — Encounter: Payer: Self-pay | Admitting: General Surgery

## 2020-12-13 ENCOUNTER — Ambulatory Visit: Payer: Federal, State, Local not specified - PPO | Admitting: Anesthesiology

## 2020-12-13 ENCOUNTER — Ambulatory Visit
Admission: RE | Admit: 2020-12-13 | Discharge: 2020-12-13 | Disposition: A | Discharge status: Home / Self Care | Payer: Federal, State, Local not specified - PPO | Attending: General Surgery | Admitting: General Surgery

## 2020-12-13 ENCOUNTER — Encounter
Admission: RE | Disposition: A | Discharge status: Home / Self Care | Payer: Self-pay | Source: Home / Self Care | Attending: General Surgery

## 2020-12-13 ENCOUNTER — Other Ambulatory Visit: Payer: Self-pay

## 2020-12-13 DIAGNOSIS — Z7984 Long term (current) use of oral hypoglycemic drugs: Secondary | ICD-10-CM | POA: Insufficient documentation

## 2020-12-13 DIAGNOSIS — K8010 Calculus of gallbladder with chronic cholecystitis without obstruction: Secondary | ICD-10-CM | POA: Diagnosis not present

## 2020-12-13 DIAGNOSIS — I10 Essential (primary) hypertension: Secondary | ICD-10-CM | POA: Diagnosis not present

## 2020-12-13 DIAGNOSIS — K8020 Calculus of gallbladder without cholecystitis without obstruction: Secondary | ICD-10-CM | POA: Diagnosis not present

## 2020-12-13 DIAGNOSIS — F17210 Nicotine dependence, cigarettes, uncomplicated: Secondary | ICD-10-CM | POA: Insufficient documentation

## 2020-12-13 DIAGNOSIS — Z01810 Encounter for preprocedural cardiovascular examination: Secondary | ICD-10-CM | POA: Diagnosis not present

## 2020-12-13 LAB — GLUCOSE, CAPILLARY
Glucose-Capillary: 105 mg/dL — ABNORMAL HIGH (ref 70–99)
Glucose-Capillary: 161 mg/dL — ABNORMAL HIGH (ref 70–99)

## 2020-12-13 SURGERY — CHOLECYSTECTOMY, ROBOT-ASSISTED, LAPAROSCOPIC
Anesthesia: General

## 2020-12-13 MED ORDER — ONDANSETRON HCL 4 MG/2ML IJ SOLN
INTRAMUSCULAR | Status: AC
  Filled 2020-12-13: qty 2

## 2020-12-13 MED ORDER — KETAMINE HCL 10 MG/ML IJ SOLN
INTRAMUSCULAR | Status: DC | PRN
  Administered 2020-12-13: 40 mg via INTRAVENOUS

## 2020-12-13 MED ORDER — ACETAMINOPHEN 500 MG PO TABS: 1000.0000 mg | ORAL_TABLET | Freq: Four times a day (QID) | ORAL | 0 refills | Status: AC

## 2020-12-13 MED ORDER — PROPOFOL 10 MG/ML IV BOLUS
INTRAVENOUS | Status: DC | PRN
  Administered 2020-12-13: 200 mg via INTRAVENOUS

## 2020-12-13 MED ORDER — CHLORHEXIDINE GLUCONATE 0.12 % MT SOLN
OROMUCOSAL | Status: AC
  Administered 2020-12-13: 15 mL via OROMUCOSAL
  Filled 2020-12-13: qty 15

## 2020-12-13 MED ORDER — CHLORHEXIDINE GLUCONATE 0.12 % MT SOLN: 15.0000 mL | Freq: Once | OROMUCOSAL | Status: AC

## 2020-12-13 MED ORDER — ACETAMINOPHEN 500 MG PO TABS
1000.0000 mg | ORAL_TABLET | ORAL | Status: AC
  Administered 2020-12-13: 1000 mg via ORAL

## 2020-12-13 MED ORDER — DEXAMETHASONE SODIUM PHOSPHATE 10 MG/ML IJ SOLN
INTRAMUSCULAR | Status: DC | PRN
  Administered 2020-12-13: 8 mg via INTRAVENOUS

## 2020-12-13 MED ORDER — MIDAZOLAM HCL 2 MG/2ML IJ SOLN
INTRAMUSCULAR | Status: AC
  Filled 2020-12-13: qty 2

## 2020-12-13 MED ORDER — OXYCODONE HCL 5 MG PO TABS
ORAL_TABLET | ORAL | Status: AC
  Administered 2020-12-13: 10 mg via ORAL
  Filled 2020-12-13: qty 1

## 2020-12-13 MED ORDER — ACETAMINOPHEN 500 MG PO TABS
ORAL_TABLET | ORAL | Status: AC
  Filled 2020-12-13: qty 2

## 2020-12-13 MED ORDER — KETAMINE HCL 50 MG/5ML IJ SOSY
PREFILLED_SYRINGE | INTRAMUSCULAR | Status: AC
  Filled 2020-12-13: qty 5

## 2020-12-13 MED ORDER — CHLORHEXIDINE GLUCONATE CLOTH 2 % EX PADS
6.0000 | MEDICATED_PAD | Freq: Once | CUTANEOUS | Status: AC
  Administered 2020-12-13: 6 via TOPICAL

## 2020-12-13 MED ORDER — OXYCODONE HCL 5 MG PO TABS: 5.0000 mg | ORAL_TABLET | Freq: Four times a day (QID) | ORAL | 0 refills | Status: DC | PRN

## 2020-12-13 MED ORDER — PHENYLEPHRINE HCL (PRESSORS) 10 MG/ML IV SOLN
INTRAVENOUS | Status: DC | PRN
  Administered 2020-12-13 (×3): 100 ug via INTRAVENOUS
  Administered 2020-12-13: 200 ug via INTRAVENOUS
  Administered 2020-12-13 (×3): 100 ug via INTRAVENOUS

## 2020-12-13 MED ORDER — ROCURONIUM BROMIDE 10 MG/ML (PF) SYRINGE
PREFILLED_SYRINGE | INTRAVENOUS | Status: AC
  Filled 2020-12-13: qty 10

## 2020-12-13 MED ORDER — ROCURONIUM BROMIDE 100 MG/10ML IV SOLN
INTRAVENOUS | Status: DC | PRN
  Administered 2020-12-13: 50 mg via INTRAVENOUS

## 2020-12-13 MED ORDER — ONDANSETRON HCL 4 MG/2ML IJ SOLN
INTRAMUSCULAR | Status: DC | PRN
  Administered 2020-12-13: 4 mg via INTRAVENOUS

## 2020-12-13 MED ORDER — ONDANSETRON HCL 4 MG/2ML IJ SOLN
4.0000 mg | Freq: Once | INTRAMUSCULAR | Status: AC
  Administered 2020-12-13: 4 mg via INTRAVENOUS

## 2020-12-13 MED ORDER — FENTANYL CITRATE (PF) 100 MCG/2ML IJ SOLN
INTRAMUSCULAR | Status: AC
  Filled 2020-12-13: qty 2

## 2020-12-13 MED ORDER — SUGAMMADEX SODIUM 200 MG/2ML IV SOLN
INTRAVENOUS | Status: DC | PRN
  Administered 2020-12-13: 200 mg via INTRAVENOUS

## 2020-12-13 MED ORDER — PROPOFOL 10 MG/ML IV BOLUS
INTRAVENOUS | Status: AC
  Filled 2020-12-13: qty 20

## 2020-12-13 MED ORDER — OXYCODONE HCL 5 MG PO TABS: 10.0000 mg | ORAL_TABLET | Freq: Once | ORAL | Status: AC | PRN

## 2020-12-13 MED ORDER — IBUPROFEN 800 MG PO TABS: 800.0000 mg | ORAL_TABLET | Freq: Four times a day (QID) | ORAL | 0 refills | Status: AC | PRN

## 2020-12-13 MED ORDER — LIDOCAINE HCL (CARDIAC) PF 100 MG/5ML IV SOSY
PREFILLED_SYRINGE | INTRAVENOUS | Status: DC | PRN
  Administered 2020-12-13: 80 mg via INTRAVENOUS

## 2020-12-13 MED ORDER — LIDOCAINE HCL (PF) 2 % IJ SOLN
INTRAMUSCULAR | Status: AC
  Filled 2020-12-13: qty 5

## 2020-12-13 MED ORDER — BUPIVACAINE HCL (PF) 0.25 % IJ SOLN
INTRAMUSCULAR | Status: AC
  Filled 2020-12-13: qty 30

## 2020-12-13 MED ORDER — SODIUM CHLORIDE 0.9 % IV SOLN: INTRAVENOUS | Status: DC

## 2020-12-13 MED ORDER — FENTANYL CITRATE (PF) 100 MCG/2ML IJ SOLN
INTRAMUSCULAR | Status: AC
  Administered 2020-12-13: 50 ug via INTRAVENOUS
  Filled 2020-12-13: qty 2

## 2020-12-13 MED ORDER — FAMOTIDINE 20 MG PO TABS
ORAL_TABLET | ORAL | Status: AC
  Administered 2020-12-13: 20 mg via ORAL
  Filled 2020-12-13: qty 1

## 2020-12-13 MED ORDER — DEXAMETHASONE SODIUM PHOSPHATE 10 MG/ML IJ SOLN
INTRAMUSCULAR | Status: AC
  Filled 2020-12-13: qty 1

## 2020-12-13 MED ORDER — OXYCODONE HCL 5 MG PO TABS
ORAL_TABLET | ORAL | Status: AC
  Filled 2020-12-13: qty 1

## 2020-12-13 MED ORDER — LIDOCAINE-EPINEPHRINE 1 %-1:100000 IJ SOLN
INTRAMUSCULAR | Status: DC | PRN
  Administered 2020-12-13: 10 mL via SUBCUTANEOUS

## 2020-12-13 MED ORDER — INDOCYANINE GREEN 25 MG IV SOLR
7.5000 mg | Freq: Once | INTRAVENOUS | Status: AC
Start: 2020-12-13 — End: 2020-12-13
  Administered 2020-12-13: 7.5 mg via INTRAVENOUS
  Filled 2020-12-13: qty 3

## 2020-12-13 MED ORDER — MIDAZOLAM HCL 2 MG/2ML IJ SOLN
INTRAMUSCULAR | Status: DC | PRN
  Administered 2020-12-13: 2 mg via INTRAVENOUS

## 2020-12-13 MED ORDER — OXYCODONE HCL 5 MG/5ML PO SOLN: 10.0000 mg | Freq: Once | ORAL | Status: AC | PRN

## 2020-12-13 MED ORDER — LIDOCAINE-EPINEPHRINE 1 %-1:100000 IJ SOLN
INTRAMUSCULAR | Status: AC
  Filled 2020-12-13: qty 1

## 2020-12-13 MED ORDER — SODIUM CHLORIDE 0.9 % IV SOLN
2.0000 g | INTRAVENOUS | Status: AC
  Administered 2020-12-13: 2 g via INTRAVENOUS

## 2020-12-13 MED ORDER — FENTANYL CITRATE (PF) 100 MCG/2ML IJ SOLN
25.0000 ug | INTRAMUSCULAR | Status: DC | PRN
  Administered 2020-12-13: 50 ug via INTRAVENOUS

## 2020-12-13 MED ORDER — FAMOTIDINE 20 MG PO TABS: 20.0000 mg | ORAL_TABLET | Freq: Once | ORAL | Status: AC

## 2020-12-13 MED ORDER — FENTANYL CITRATE (PF) 100 MCG/2ML IJ SOLN
INTRAMUSCULAR | Status: DC | PRN
  Administered 2020-12-13: 100 ug via INTRAVENOUS

## 2020-12-13 MED ORDER — SODIUM CHLORIDE 0.9 % IV SOLN
INTRAVENOUS | Status: AC
  Filled 2020-12-13: qty 2

## 2020-12-13 MED ORDER — ORAL CARE MOUTH RINSE: 15.0000 mL | Freq: Once | OROMUCOSAL | Status: AC

## 2020-12-13 SURGICAL SUPPLY — 64 items
"PENCIL ELECTRO HAND CTR " (MISCELLANEOUS) ×1 IMPLANT
ADH SKN CLS APL DERMABOND .7 (GAUZE/BANDAGES/DRESSINGS) ×1
APL PRP STRL LF DISP 70% ISPRP (MISCELLANEOUS) ×1
BAG INFUSER PRESSURE 100CC (MISCELLANEOUS) ×2 IMPLANT
BAG SPEC RTRVL LRG 6X4 10 (ENDOMECHANICALS) ×1
BLADE SURG SZ11 CARB STEEL (BLADE) ×2 IMPLANT
CANISTER SUCT 1200ML W/VALVE (MISCELLANEOUS) ×1 IMPLANT
CANNULA REDUC XI 12-8 STAPL (CANNULA) ×2
CANNULA REDUCER 12-8 DVNC XI (CANNULA) ×1 IMPLANT
CHLORAPREP W/TINT 26 (MISCELLANEOUS) ×2 IMPLANT
CLIP VESOLOCK MED LG 6/CT (CLIP) ×3 IMPLANT
COVER TIP SHEARS 8 DVNC (MISCELLANEOUS) ×1 IMPLANT
COVER TIP SHEARS 8MM DA VINCI (MISCELLANEOUS) ×2
DECANTER SPIKE VIAL GLASS SM (MISCELLANEOUS) ×4 IMPLANT
DEFOGGER SCOPE WARMER CLEARIFY (MISCELLANEOUS) ×2 IMPLANT
DERMABOND ADVANCED (GAUZE/BANDAGES/DRESSINGS) ×1
DERMABOND ADVANCED .7 DNX12 (GAUZE/BANDAGES/DRESSINGS) ×1 IMPLANT
DRAPE ARM DVNC X/XI (DISPOSABLE) ×4 IMPLANT
DRAPE COLUMN DVNC XI (DISPOSABLE) ×1 IMPLANT
DRAPE DA VINCI XI ARM (DISPOSABLE) ×8
DRAPE DA VINCI XI COLUMN (DISPOSABLE) ×2
ELECT CAUTERY BLADE TIP 2.5 (TIP) ×2
ELECT REM PT RETURN 9FT ADLT (ELECTROSURGICAL) ×2
ELECTRODE CAUTERY BLDE TIP 2.5 (TIP) ×1 IMPLANT
ELECTRODE REM PT RTRN 9FT ADLT (ELECTROSURGICAL) ×1 IMPLANT
GAUZE 4X4 16PLY ~~LOC~~+RFID DBL (SPONGE) ×2 IMPLANT
GLOVE SURG SYN 6.5 ES PF (GLOVE) ×6 IMPLANT
GLOVE SURG SYN 6.5 PF PI (GLOVE) ×1 IMPLANT
GLOVE SURG UNDER LTX SZ7 (GLOVE) ×5 IMPLANT
GOWN STRL REUS W/ TWL LRG LVL3 (GOWN DISPOSABLE) ×3 IMPLANT
GOWN STRL REUS W/TWL LRG LVL3 (GOWN DISPOSABLE) ×6
GRASPER SUT TROCAR 14GX15 (MISCELLANEOUS) IMPLANT
IRRIGATOR SUCT 8 DISP DVNC XI (IRRIGATION / IRRIGATOR) IMPLANT
IRRIGATOR SUCTION 8MM XI DISP (IRRIGATION / IRRIGATOR)
IV NS 1000ML (IV SOLUTION)
IV NS 1000ML BAXH (IV SOLUTION) IMPLANT
KIT PINK PAD W/HEAD ARE REST (MISCELLANEOUS) ×2
KIT PINK PAD W/HEAD ARM REST (MISCELLANEOUS) ×1 IMPLANT
LABEL OR SOLS (LABEL) ×2 IMPLANT
MANIFOLD NEPTUNE II (INSTRUMENTS) ×2 IMPLANT
NDL INSUFFLATION 14GA 120MM (NEEDLE) IMPLANT
NEEDLE HYPO 22GX1.5 SAFETY (NEEDLE) ×2 IMPLANT
NEEDLE INSUFFLATION 14GA 120MM (NEEDLE) IMPLANT
NS IRRIG 500ML POUR BTL (IV SOLUTION) ×2 IMPLANT
OBTURATOR OPTICAL STANDARD 8MM (TROCAR) ×2
OBTURATOR OPTICAL STND 8 DVNC (TROCAR) ×1
OBTURATOR OPTICALSTD 8 DVNC (TROCAR) ×1 IMPLANT
PACK LAP CHOLECYSTECTOMY (MISCELLANEOUS) ×2 IMPLANT
PENCIL ELECTRO HAND CTR (MISCELLANEOUS) ×2 IMPLANT
POUCH SPECIMEN RETRIEVAL 10MM (ENDOMECHANICALS) ×2 IMPLANT
SEAL CANN UNIV 5-8 DVNC XI (MISCELLANEOUS) ×3 IMPLANT
SEAL XI 5MM-8MM UNIVERSAL (MISCELLANEOUS) ×6
SET TUBE SMOKE EVAC HIGH FLOW (TUBING) ×2 IMPLANT
SOLUTION ELECTROLUBE (MISCELLANEOUS) ×2 IMPLANT
STAPLER CANNULA SEAL DVNC XI (STAPLE) ×1 IMPLANT
STAPLER CANNULA SEAL XI (STAPLE) ×2
STRIP CLOSURE SKIN 1/2X4 (GAUZE/BANDAGES/DRESSINGS) ×2 IMPLANT
SUT MNCRL 4-0 (SUTURE) ×2
SUT MNCRL 4-0 27XMFL (SUTURE) ×1
SUT VIC AB 3-0 SH 27 (SUTURE) ×2
SUT VIC AB 3-0 SH 27X BRD (SUTURE) IMPLANT
SUT VICRYL 0 AB UR-6 (SUTURE) ×1 IMPLANT
SUTURE MNCRL 4-0 27XMF (SUTURE) ×1 IMPLANT
TROCAR XCEL NON-BLD 5MMX100MML (ENDOMECHANICALS) IMPLANT

## 2020-12-13 NOTE — Interval H&P Note (Signed)
History and Physical Interval Note:  12/13/2020 10:15 AM  Stacey Stanley  has presented today for surgery, with the diagnosis of symtomatic cholelithiasis.  The various methods of treatment have been discussed with the patient and family. After consideration of risks, benefits and other options for treatment, the patient has consented to  Procedure(s): XI ROBOTIC [place] (N/A) as a surgical intervention.  The patient's history has been reviewed, patient examined, no change in status, stable for surgery.  I have reviewed the patient's chart and labs.  Questions were answered to the patient's satisfaction.     Fredirick Maudlin

## 2020-12-13 NOTE — Transfer of Care (Signed)
Immediate Anesthesia Transfer of Care Note  Patient: [name]  Procedure(s) Performed: XI ROBOTIC ASSISTED LAPAROSCOPIC CHOLECYSTECTOMY  Patient Location: PACU  Anesthesia Type:General  Level of Consciousness: drowsy  Airway & Oxygen Therapy: Patient Spontanous Breathing and Patient connected to nasal cannula oxygen  Post-op Assessment: Report given to RN and Post -op Vital signs reviewed and stable  Post vital signs: Reviewed and stable  Last Vitals:  Vitals Value Taken Time  BP 121/80 12/13/20 1251  Temp    Pulse 66 12/13/20 1300  Resp 16 12/13/20 1300  SpO2 100 % 12/13/20 1300  Vitals shown include unvalidated device data.  Last Pain:  Vitals:   12/13/20 0951  TempSrc: Temporal  PainSc: 0-No pain         Complications: No notable events documented.

## 2020-12-13 NOTE — Anesthesia Postprocedure Evaluation (Signed)
Anesthesia Post Note  Patient: Stacey Stanley  Procedure(s) Performed: XI ROBOTIC ASSISTED LAPAROSCOPIC CHOLECYSTECTOMY  Patient location during evaluation: PACU Anesthesia Type: General Level of consciousness: awake and alert Pain management: pain level controlled Vital Signs Assessment: post-procedure vital signs reviewed and stable Respiratory status: spontaneous breathing, nonlabored ventilation, respiratory function stable and patient connected to nasal cannula oxygen Cardiovascular status: blood pressure returned to baseline and stable Postop Assessment: no apparent nausea or vomiting Anesthetic complications: no   No notable events documented.   Last Vitals:  Vitals:   12/13/20 1427 12/13/20 1504  BP: (!) 118/95 119/85  Pulse: 72 69  Resp: 16 16  Temp: (!) 36.1 C   SpO2: 95% 100%    Last Pain:  Vitals:   12/13/20 1504  TempSrc:   PainSc: [place]

## 2020-12-13 NOTE — Op Note (Signed)
Operative note  Pre-operative Diagnosis: symptomatic cholelithiasis   Post-operative Diagnosis: same  Procedure: Robot assisted laparoscopic cholecystectomy with intraoperative ICG cholangiography  Surgeon: Fredirick Maudlin, MD  Anesthesia: GETA  Findings: stone wedged in cystic duct, normal biliary anatomy, no acute inflammation  Estimated Blood Loss: <5 cc       Specimens: Gallbladder           Complications: none immediately apparent  Procedure In Detail: The patient was seen again in the holding room. The benefits, complications, treatment options, and expected outcomes were discussed with the patient. The risks of bleeding, infection, recurrence of symptoms, failure to resolve symptoms, bile duct damage, bile duct leak, retained common bile duct stone, bowel injury, any of which could require further surgery and/or ERCP, stent, or papillotomy were reviewed with the patient. The likelihood of improving the patient's symptoms with return to their baseline status is good.  The patient and/or family concurred with the proposed plan, giving informed consent.  The patient was taken to operating room, identified and the procedure verified as laparoscopic cholecystectomy.   A time out was held and the above information confirmed.  Prior to the induction of general anesthesia, antibiotic prophylaxis was administered. VTE prophylaxis was in place. General endotracheal anesthesia was then administered and tolerated well. After the induction, the abdomen was prepped with Chloraprep and draped in the sterile fashion. The patient was positioned in the supine position.  Optiview technique was used to enter the abdomen in the left upper quadrant at Palmer's point via a standard 5 mm laparoscopic trocar.  Pneumoperitoneum was then created with CO2 and tolerated well without any adverse changes in the patient's vital signs.  A 12 mm robotic trocar along with two 8-mm robotic trocars were placed under  direct vision in the lower abdomen.  The 5 mm trocar at Palmer's point was exchanged for an 8 mm trocar.  All skin incisions were infiltrated with a local anesthetic agent before making the incision and placing the trocars.   The patient was positioned in 15 degrees of reverse Trendelenburg and tilted 10 degrees to the left.  The robot was brought to the surgical field and docked in the standard fashion.  We made sure all the instrumentation was kept in direct view at all times and that there were no collision between the arms.  I scrubbed out and went to the console.  The gallbladder was identified, the fundus grasped and retracted cephalad. Adhesions were lysed bluntly. The infundibulum was grasped and retracted laterally, exposing the peritoneum overlying the triangle of Calot. This was then divided and exposed in a blunt fashion. An extended critical view of the cystic duct and cystic artery was obtained.  The cystic duct was clearly identified and bluntly dissected away from the surrounding tissues, as was the cystic artery.  Using ICG cholangiography we visualized the cystic duct and confirmed that there was no aberrant biliary ductal anatomy nor any evidence of bile duct injury.  Both cystic duct and cystic artery were clipped and divided. The gallbladder was taken from the gallbladder fossa in a retrograde fashion with the electrocautery. Hemostasis was achieved with the electrocautery. Inspection of the right upper quadrant was performed. No bleeding, bile duct injury or leak, or bowel injury was noted.  The gallbladder was then placed in an Endopouch bag. The robotic instruments were removed and robotic arms were undocked in the standard fashion.    I scrubbed back in.  The gallbladder was removed via the 12  mm trocar site.  Using the PMI and a Carter-Thomason cone, the 12 mm port site was then closed with interrupted 0 Vicryl sutures.  The remaining 8 mm ports were removed and pneumoperitoneum  was released.  Each port site was closed with deep dermal 3-0 Vicryl.  4-0 subcuticular Monocryl was used to close the skin. Dermabond was applied, followed by Steri-Strips.  The patient was then awakened, extubated, and taken to the postanesthesia recovery unit in stable condition.   Sponge, lap, and needle counts were reported to be correct number at closure and at the conclusion of the case.          Fredirick Maudlin, MD FACS

## 2020-12-13 NOTE — Discharge Instructions (Signed)

## 2020-12-13 NOTE — Anesthesia Procedure Notes (Signed)
Procedure Name: Intubation Date/Time: 12/13/2020 11:09 AM Performed by: Aline Brochure, CRNA Pre-anesthesia Checklist: Patient identified, Patient being monitored, Timeout performed, Emergency Drugs available and Suction available Patient Re-evaluated:Patient Re-evaluated prior to induction Oxygen Delivery Method: Circle system utilized Preoxygenation: Pre-oxygenation with 100% oxygen Induction Type: IV induction Ventilation: Mask ventilation without difficulty Laryngoscope Size: Mac and 3 Grade View: Grade I Tube type: Oral Tube size: 7.0 mm Number of attempts: 1 Airway Equipment and Method: Stylet Placement Confirmation: ETT inserted through vocal cords under direct vision, positive ETCO2 and breath sounds checked- equal and bilateral Secured at: 21 cm Tube secured with: Tape Dental Injury: Teeth and Oropharynx as per pre-operative assessment

## 2020-12-13 NOTE — Anesthesia Preprocedure Evaluation (Addendum)
Anesthesia Evaluation  Patient identified by MRN, date of birth, ID band Patient awake    Reviewed: Allergy & Precautions, NPO status , Patient's Chart, lab work & pertinent test results  History of Anesthesia Complications Negative for: history of anesthetic complications  Airway Mallampati: II  TM Distance: >3 FB Neck ROM: full    Dental  (+) Missing Braces on intact teeth. Missing bilateral lower molars:   Pulmonary neg pulmonary ROS, Current Smoker and Patient abstained from smoking.,    Pulmonary exam normal        Cardiovascular Exercise Tolerance: Good hypertension, On Medications (-) DOE negative cardio ROS Normal cardiovascular exam(-) dysrhythmias      Neuro/Psych neg Headaches, negative neurological ROS  negative psych ROS   GI/Hepatic Neg liver ROS, Abdominal pain and diarrhea   Endo/Other  negative endocrine ROSdiabetes  Renal/GU      Musculoskeletal   Abdominal   Peds  Hematology negative hematology ROS (+)   Anesthesia Other Findings Past Medical History: No date: Diabetes mellitus without complication ([place]) No date: Dysrhythmia     Comment:  Home with daughter [name] No date: Endometriosis No date: History of kidney stones No date: Hypertension No date: Metabolic syndrome No date: Obesity 2017: Right thyroid nodule     Comment:  benign No date: Serum calcium elevated No date: Sleep disorder, circadian, shift work type No date: SVD (spontaneous vaginal delivery)     Comment:  x 2 No date: Tachycardia     Comment:  History - neg cardiac tests-stress related  Past Surgical History: 05/21/2016: ABDOMINAL HYSTERECTOMY 2019: COLONOSCOPY No date: DILATION AND CURETTAGE OF UTERUS 06/28/2013: EYE SURGERY     Comment:  right laser eye surgery - repair retina 04/04/2018: HEMORROIDECTOMY     Comment:  Dr. Levin Bacon  07/19/2013: HYSTEROSCOPY WITH D & C; N/A     Comment:  Procedure:  DILATATION AND CURETTAGE /HYSTEROSCOPY;                Surgeon: Marvene Staff, MD;  Location: [facility];                Service: Gynecology;  Laterality: N/A; No date: INDUCED ABORTION 1992: LAPAROSCOPY     Comment:  endometriosis 07/19/2013: LAPAROSCOPY; N/A     Comment:  Procedure: LAPAROSCOPY DIAGNOSTIC  with resection of               endometriosis;  Surgeon: Marvene Staff, MD;                Location: [facility];  Service: Gynecology;  Laterality: N/A; 2003: metatarsil      Comment:  left foot surgery      Reproductive/Obstetrics negative OB ROS                            Anesthesia Physical Anesthesia Plan  ASA: 2  Anesthesia Plan: General ETT   Post-op Pain Management:    Induction: Intravenous  PONV Risk Score and Plan: 3 and Ondansetron, Dexamethasone, Midazolam and Treatment may vary due to age or medical condition  Airway Management Planned: Oral ETT  Additional Equipment:   Intra-op Plan:   Post-operative Plan: Extubation in OR  Informed Consent: I have reviewed the patients History and Physical, chart, labs and discussed the procedure including the risks, benefits and alternatives for the proposed anesthesia with the patient or authorized representative who has indicated his/her understanding and acceptance.  Dental Advisory Given  Plan Discussed with: Anesthesiologist, CRNA and Surgeon  Anesthesia Plan Comments: (Patient consented for risks of anesthesia including but not limited to:  - adverse reactions to medications - damage to eyes, teeth, lips or other oral mucosa - nerve damage due to positioning  - sore throat or hoarseness - Damage to heart, brain, nerves, lungs, other parts of body or loss of life  Patient voiced understanding.)        Anesthesia Quick Evaluation

## 2020-12-16 LAB — SURGICAL PATHOLOGY

## 2020-12-17 ENCOUNTER — Telehealth: Payer: Self-pay | Admitting: General Surgery

## 2020-12-17 NOTE — Telephone Encounter (Signed)
Pt called requesting advise/guidance related to her post op (lap chole w/Dr. Elinor Dodge 12/13/20).  She would like to discuss the expectations on healing; pain/tenderness around the surgical sites, as well as around her lower abdomen.  She feels that she has gotten over the surgical gas pains but was not sure what to expect through healing.  (No drainage & minimal bruising.)  Please call back to advise.

## 2020-12-17 NOTE — Telephone Encounter (Signed)
Spoke with patient- expect to have some mild pain and soreness from surgery-if she notices any bright redness or pus drainage or develops fever or has increased pain need to come to office- she denies fever, and states incisions look ok- she has not had a bowel movement in 4 days so we discussed Miralax and to take it daily or until bowels move regularly. Stay hydrated. No nausea at this time. May want to watch eating a high fat diet can cause diarrhea. Suggested moving about more as this will help with some of the soreness and moving the gas out. Patient has post operative appointment 12/25/2020.

## 2020-12-24 ENCOUNTER — Ambulatory Visit: Payer: Federal, State, Local not specified - PPO | Attending: Family Medicine

## 2020-12-25 ENCOUNTER — Encounter: Payer: Self-pay | Admitting: Physician Assistant

## 2020-12-25 ENCOUNTER — Other Ambulatory Visit: Payer: Self-pay

## 2020-12-25 ENCOUNTER — Ambulatory Visit (INDEPENDENT_AMBULATORY_CARE_PROVIDER_SITE_OTHER): Payer: Federal, State, Local not specified - PPO | Admitting: Physician Assistant

## 2020-12-25 VITALS — BP 115/85 | HR 91 | Temp 98.1°F | Ht 65.0 in | Wt 173.0 lb

## 2020-12-25 DIAGNOSIS — K8020 Calculus of gallbladder without cholecystitis without obstruction: Secondary | ICD-10-CM

## 2020-12-25 DIAGNOSIS — Z09 Encounter for follow-up examination after completed treatment for conditions other than malignant neoplasm: Secondary | ICD-10-CM

## 2020-12-25 NOTE — Progress Notes (Signed)
[facility] POST-OP OFFICE VISIT  12/25/2020  HPI: Stacey Stanley is a 45 y.o. female 12 days s/p robotic assisted laparoscopic cholecystectomy for symptomatic cholelithiasis with Dr Celine Ahr.   She reports that initially the first few days were pretty rough with her abdominal discomfort but this has gotten a lot better each day. She is only using tylenol/Ibuprofen and Nucynta which she takes intermittently for pelvic pain. This seems to be working for her. She also reports intermittent nausea but that was an issue even before her operation. She has antiemetics at home. No fever, chills, emesis, or diarrhea. No issues with the incisions themselves. Some decreased appetite but tolerating food without issues. No other complaints.   Vital signs: BP 115/85   Pulse 91   Temp 98.1 F (36.7 C)   Ht '5\' 5"'$  (1.651 m)   Wt 173 lb (78.5 kg)   LMP 07/09/2015 (Exact Date)   SpO2 98%   BMI 28.79 kg/m    Physical Exam: Constitutional: Well appearing female, NAD Abdomen: Soft, generalized incisional soreness, non-distended, no rebound/guarding Skin: Laparoscopic incisions have healed well, steri-strips removed, no erythema or drainage   Assessment/Plan: This is a 45 y.o. female 12 days s/p robotic assisted laparoscopic cholecystectomy for symptomatic cholelithiasis    - continue current pain regimen  - encouraged nutritional supplementation (ex: protein drinks) if decreased appetite continues  - reviewed wound care  - reviewed lifting restrictions; work note given; 4 weeks total  - Reviewed pathology: [facility]  - She will follow up on as needed basis; advised to call with questions/concerns   -- Edison Simon, PA-C [facility] 12/25/2020, 3:16 PM [phone] M-F: 7am - 4pm

## 2020-12-25 NOTE — Patient Instructions (Addendum)
You may advance your diet slowly as tolerated.   GENERAL POST-OPERATIVE PATIENT INSTRUCTIONS   WOUND CARE INSTRUCTIONS: Try to keep the wound dry and avoid ointments on the wound unless directed to do so.  If the wound becomes bright red and painful or starts to drain infected material that is not clear, please contact your physician immediately.  If the wound is mildly pink and has a thick firm ridge underneath it, this is normal, and is referred to as a healing ridge.  This will resolve over the next 4-6 weeks.  BATHING: You may shower if you have been informed of this by your surgeon. However, Please do not submerge in a tub, hot tub, or pool until incisions are completely sealed or have been told by your surgeon that you may do so.  DIET:  You may eat any foods that you can tolerate.  It is a good idea to eat a high fiber diet and take in plenty of fluids to prevent constipation.  If you do become constipated you may want to take a mild laxative or take ducolax tablets on a daily basis until your bowel habits are regular.  Constipation can be very uncomfortable, along with straining, after recent surgery.  ACTIVITY: You may want to hug a pillow when coughing and sneezing to add additional support to the surgical area, if you had abdominal or chest surgery, which will decrease pain during these times.  You are encouraged to walk and engage in light activity for the next two weeks.  You should not lift more than 15-20 pounds for 4 weeks after surgery as it could put you at increased risk for complications.  Twenty pounds is roughly equivalent to a plastic bag of groceries. At that time- Listen to your body when lifting, if you have pain when lifting, stop and then try again in a few days. Soreness after doing exercises or activities of daily living is normal as you get back in to your normal routine.  MEDICATIONS:  Try to take narcotic medications and anti-inflammatory medications, such as tylenol,  ibuprofen, naprosyn, etc., with food.  This will minimize stomach upset from the medication.  Should you develop nausea and vomiting from the pain medication, or develop a rash, please discontinue the medication and contact your physician.  You should not drive, make important decisions, or operate machinery when taking narcotic pain medication.  SUNBLOCK Use sun block to incision area over the next year if this area will be exposed to sun. This helps decrease scarring and will allow you avoid a permanent darkened area over your incision.  QUESTIONS:  Please feel free to call our office if you have any questions, and we will be glad to assist you.

## 2020-12-30 ENCOUNTER — Telehealth: Payer: Self-pay

## 2020-12-30 NOTE — Telephone Encounter (Signed)
Copied from Mobile [phone]. Topic: General - Inquiry >> Dec 30, 2020  9:44 AM Loma Boston wrote: Pt wants to fu on fax from [facility] on Fri to make sure received. [phone] 2nd request/ resent not legible on 1st.  FU with pt to advise.

## 2020-12-31 ENCOUNTER — Other Ambulatory Visit: Payer: Self-pay

## 2020-12-31 ENCOUNTER — Telehealth: Payer: Self-pay

## 2020-12-31 ENCOUNTER — Encounter: Payer: Self-pay | Admitting: Student in an Organized Health Care Education/Training Program

## 2020-12-31 ENCOUNTER — Ambulatory Visit
Payer: Federal, State, Local not specified - PPO | Attending: Student in an Organized Health Care Education/Training Program | Admitting: Student in an Organized Health Care Education/Training Program

## 2020-12-31 VITALS — BP 110/67 | HR 94 | Temp 97.5°F | Resp 14 | Ht 65.0 in | Wt 175.0 lb

## 2020-12-31 DIAGNOSIS — G894 Chronic pain syndrome: Secondary | ICD-10-CM

## 2020-12-31 DIAGNOSIS — N809 Endometriosis, unspecified: Secondary | ICD-10-CM | POA: Diagnosis not present

## 2020-12-31 DIAGNOSIS — G4700 Insomnia, unspecified: Secondary | ICD-10-CM | POA: Insufficient documentation

## 2020-12-31 DIAGNOSIS — R102 Pelvic and perineal pain: Secondary | ICD-10-CM | POA: Insufficient documentation

## 2020-12-31 DIAGNOSIS — E8881 Metabolic syndrome: Secondary | ICD-10-CM | POA: Insufficient documentation

## 2020-12-31 DIAGNOSIS — G8929 Other chronic pain: Secondary | ICD-10-CM | POA: Insufficient documentation

## 2020-12-31 DIAGNOSIS — M5412 Radiculopathy, cervical region: Secondary | ICD-10-CM | POA: Diagnosis not present

## 2020-12-31 MED ORDER — TAPENTADOL HCL 75 MG PO TABS: 75.0000 mg | ORAL_TABLET | Freq: Two times a day (BID) | ORAL | 0 refills | Status: AC | PRN

## 2020-12-31 MED ORDER — TAPENTADOL HCL 75 MG PO TABS: 75.0000 mg | ORAL_TABLET | Freq: Two times a day (BID) | ORAL | 0 refills | Status: DC | PRN

## 2020-12-31 NOTE — Progress Notes (Signed)
Nursing Pain Medication Assessment:  Safety precautions to be maintained throughout the outpatient stay will include: orient to surroundings, keep bed in low position, maintain call bell within reach at all times, provide assistance with transfer out of bed and ambulation.  Medication Inspection Compliance: Pill count conducted under aseptic conditions, in front of the patient. Neither the pills nor the bottle was removed from the patient's sight at any time. Once count was completed pills were immediately returned to the patient in their original bottle.  Medication: Tapentadol (Nucynta) Pill/Patch Count:  21 of 45 pills remain Pill/Patch Appearance: Markings consistent with prescribed medication Bottle Appearance: Standard pharmacy container. Clearly labeled. Filled Date: 07 / 22 / 2022 Last Medication intake:  Today

## 2020-12-31 NOTE — Progress Notes (Signed)
PROVIDER NOTE: Information contained herein reflects review and annotations entered in association with encounter. Interpretation of such information and data should be left to medically-trained personnel. Information provided to patient can be located elsewhere in the medical record under "Patient Instructions". Document created using STT-dictation technology, any transcriptional errors that may result from process are unintentional.    Patient: Stacey Stanley  Service Category: E/M  Provider: [name], MD  DOB: 1976/01/18  DOS: 12/31/2020  Specialty: Interventional Pain Management  MRN: [id number]  Setting: Ambulatory outpatient  PCP: Steele Sizer, MD  Type: Established Patient    Referring Provider: Steele Sizer, MD  Location: Office  Delivery: Face-to-face     HPI  [name], a 45 y.o. year old female, is here today because of her Chronic pain syndrome [G89.4]. Ms. Bleich primary complain today is Pelvic Pain Last encounter: My last encounter with her was on 10/01/2020. Pertinent problems: Ms. Roehrich has Endometriosis; Chronic pelvic pain in female; Cervical radiculitis; Pelvic pain; Chronic pain syndrome; and Opiate use on their pertinent problem list. Pain Assessment: Severity of   is reported as a 6 /10. Location: Pelvis  /left abdomen. Onset: More than a month ago. Quality: Sharp, Constant. Timing: Constant. Modifying factor(s): medication, rest, heat. Vitals:  height is '5\' 5"'  (1.651 m) and weight is 175 lb (79.4 kg). Her temporal temperature is 97.5 F (36.4 C) (abnormal). Her blood pressure is 110/67 and her pulse is 94. Her respiration is 14 and oxygen saturation is 100%.   Reason for encounter: medication management.    Patient is status post robotic cholecystectomy for cholecystitis.  This was done 12/13/2020.  Patient is recovering from the surgery although she still has some abdominal discomfort.  She continues her Nucynta as prescribed.  States that it provides  pain relief and improvement in functional status.  We will refill as below.  Renew urine toxicology screen for medication compliance and monitoring.   Pharmacotherapy Assessment   Analgesic: Nucynta 75 mg twice daily as needed, quantity 45/month; MME equals 45   Monitoring: [place] PMP: PDMP reviewed during this encounter.       Pharmacotherapy: No side-effects or adverse reactions reported. Compliance: No problems identified. Effectiveness: Clinically acceptable.  Landis Martins, RN  12/31/2020  1:23 PM  Sign when Signing Visit Nursing Pain Medication Assessment:  Safety precautions to be maintained throughout the outpatient stay will include: orient to surroundings, keep bed in low position, maintain call bell within reach at all times, provide assistance with transfer out of bed and ambulation.  Medication Inspection Compliance: Pill count conducted under aseptic conditions, in front of the patient. Neither the pills nor the bottle was removed from the patient's sight at any time. Once count was completed pills were immediately returned to the patient in their original bottle.  Medication: Tapentadol (Nucynta) Pill/Patch Count:  21 of 45 pills remain Pill/Patch Appearance: Markings consistent with prescribed medication Bottle Appearance: Standard pharmacy container. Clearly labeled. Filled Date: 07 / 22 / 2022 Last Medication intake:  Today      UDS:  Summary  Date Value Ref Range Status  01/25/2020 Note  Final    Comment:    ==================================================================== ToxASSURE Select 13 (MW) ==================================================================== Test                             Result       Flag       Units  Drug Present and  Declared for Prescription Verification   Tapentadol                     372          EXPECTED   ng/mg creat    Source of tapentadol is a scheduled prescription  medication.  ==================================================================== Test                      Result    Flag   Units      Ref Range   Creatinine              141              mg/dL      >=20 ==================================================================== Declared Medications:  The flagging and interpretation on this report are based on the  following declared medications.  Unexpected results may arise from  inaccuracies in the declared medications.   **Note: The testing scope of this panel includes these medications:   Tapentadol (Nucynta)   **Note: The testing scope of this panel does not include the  following reported medications:   Acetaminophen (Tylenol)  Aspirin  Dapsone  Fish Oil  Hydrochlorothiazide  Ibuprofen (Advil)  Imiquimod (Aldara)  Metformin  Plecanatide (Trulance)  Promethazine (Phenergan)  Semaglutide (Ozempic)  Topical  Trazodone (Desyrel)  Vitamin D ==================================================================== For clinical consultation, please call [phone]. ====================================================================      ROS  Constitutional: Denies any fever or chills Gastrointestinal:  Subacute abdominal pain Musculoskeletal: Denies any acute onset joint swelling, redness, loss of ROM, or weakness Neurological: No reported episodes of acute onset apraxia, aphasia, dysarthria, agnosia, amnesia, paralysis, loss of coordination, or loss of consciousness  Medication Review  Dapsone, Fish Oil, Fluocin-Hydroquinone-Tretinoin, Semaglutide (1 MG/DOSE), hydrochlorothiazide, ibuprofen, imiquimod, metFORMIN, nicotine, promethazine, tapentadol HCl, and vitamin C  History Review  Allergy: Ms. Gilliam is allergic to nuvigil [armodafinil] and ppd [tuberculin purified protein derivative]. Drug: Ms. Santucci  reports no history of drug use. Alcohol:  reports no history of alcohol use. Tobacco:  reports that she has been  smoking cigarettes. She started smoking about 27 years ago. She has a 12.50 pack-year smoking history. She has never used smokeless tobacco. Social: Ms. Brundidge  reports that she has been smoking cigarettes. She started smoking about 27 years ago. She has a 12.50 pack-year smoking history. She has never used smokeless tobacco. She reports that she does not drink alcohol and does not use drugs. Medical:  has a past medical history of Anxiety, Arthritis, Chronic insomnia, Depression, Diabetes mellitus without complication ([place]), Dysrhythmia, Endometriosis, GERD (gastroesophageal reflux disease), Gout, Headache(784.0), History of kidney stones, Hypertension, Metabolic syndrome, Obesity, Right thyroid nodule (2017), Serum calcium elevated, Sleep disorder, circadian, shift work type, SVD (spontaneous vaginal delivery), and Tachycardia. Surgical: Ms. Veale  has a past surgical history that includes metatarsil  (2003); Dilation and curettage of uterus; Induced abortion; laparoscopy (1992); Eye surgery (06/28/2013); laparoscopy (N/A, 07/19/2013); Hysteroscopy with D & C (N/A, 07/19/2013); Abdominal hysterectomy (05/21/2016); Hemorroidectomy (04/04/2018); and Colonoscopy (2019). Family: family history includes Alzheimer's disease in her paternal grandfather; Diabetes in her mother; Heart disease in her brother; Hypertension in her father and mother; Kidney disease in her father; Leukemia in her paternal grandmother; Stroke in her maternal grandmother.  Laboratory Chemistry Profile   Renal Lab Results  Component Value Date   BUN 10 11/28/2020   CREATININE 0.76 57/06/7791   BCR NOT APPLICABLE 90/30/0923   GFRAA 108 11/28/2020   GFRNONAA 93 11/28/2020  Hepatic Lab Results  Component Value Date   AST 13 11/28/2020   ALT 11 11/28/2020   ALBUMIN 4.3 11/12/2020   ALKPHOS 46 11/12/2020   AMYLASE 102 (H) 05/22/2016   LIPASE 23 11/28/2020     Electrolytes Lab Results  Component Value Date   NA 139  11/28/2020   K 3.9 11/28/2020   CL 105 11/28/2020   CALCIUM 10.3 (H) 11/28/2020   MG 1.5 09/15/2017     Bone Lab Results  Component Value Date   VD25OH 34 08/01/2018     Inflammation (CRP: Acute Phase) (ESR: Chronic Phase) Lab Results  Component Value Date   CRP 0.2 05/06/2020   ESRSEDRATE 2 05/06/2020       Note: Above Lab results reviewed.  Recent Imaging Review  US Abdomen Limited RUQ (LIVER/GB) CLINICAL DATA:  One month of right upper quadrant pain  EXAM: ULTRASOUND ABDOMEN LIMITED RIGHT UPPER QUADRANT  COMPARISON:  CT 11/24/2019, ultrasound 03/15/2015  FINDINGS: Gallbladder:  Gallbladder contracted around multiple echogenic, posteriorly shadowing gallstones, largest visible gallstone measuring up to 4 mm in maximal diameter. No significant gallbladder wall thickening. No visible pericholecystic fluid. Sonographic Percell Miller sign is reportedly negative.  Common bile duct:  Diameter: 6 mm, borderline dilated  Liver:  No focal lesion identified. Within normal limits in parenchymal echogenicity. Portal vein is patent on color Doppler imaging with normal direction of blood flow towards the liver.  Other: None.  IMPRESSION: Cholelithiasis with the gallbladder contracted around numerous echogenic, shadowing gallstones. No significant gallbladder wall thickening or positive sonographic Murphy sign to suggest an acute cholecystitis.  Common bile duct measures up to 6 mm, borderline dilated. Recommend correlation with serologies and if abnormal, MRCP or ERCP could be obtained. This recommendation follows ACR consensus guidelines: White Paper of the ACR Incidental Findings Committee II on Gallbladder and Biliary Findings. J Am Coll Radiol 2013:;10:953-956.  Electronically Signed   By: Lovena Le M.D.   On: 11/12/2020 06:33 Note: Reviewed        Physical Exam  General appearance: Well nourished, well developed, and well hydrated. In no apparent acute  distress Mental status: Alert, oriented x 3 (person, place, & time)       Respiratory: No evidence of acute respiratory distress Eyes: PERLA Vitals: BP 110/67   Pulse 94   Temp (!) 97.5 F (36.4 C) (Temporal)   Resp 14   Ht '5\' 5"'  (1.651 m)   Wt 175 lb (79.4 kg)   LMP 07/09/2015 (Exact Date)   SpO2 100%   BMI 29.12 kg/m  BMI: Estimated body mass index is 29.12 kg/m as calculated from the following:   Height as of this encounter: '5\' 5"'  (1.651 m).   Weight as of this encounter: 175 lb (79.4 kg). Ideal: Ideal body weight: 57 kg (125 lb 10.6 oz) Adjusted ideal body weight: 66 kg (145 lb 6.4 oz)  5 out of 5 strength bilateral lower extremity: Plantar flexion, dorsiflexion, knee flexion, knee extension.   Assessment   Status Diagnosis  Controlled Controlled Controlled 1. Chronic pain syndrome   2. Pelvic pain   3. Chronic pelvic pain in female   4. Endometriosis   5. Cervical radiculitis   6. Dysmetabolic syndrome   7. Insomnia, persistent       Plan of Care   [name] has a current medication list which includes the following long-term medication(s): hydrochlorothiazide, metformin, and promethazine.  Pharmacotherapy (Medications Ordered): Meds ordered this encounter  Medications   tapentadol  HCl (NUCYNTA) 75 MG tablet    Sig: Take 1 tablet (75 mg total) by mouth 2 (two) times daily as needed.    Dispense:  45 tablet    Refill:  0    Chronic pain syndrome   tapentadol HCl (NUCYNTA) 75 MG tablet    Sig: Take 1 tablet (75 mg total) by mouth 2 (two) times daily as needed.    Dispense:  45 tablet    Refill:  0    Chronic pain syndrome   tapentadol HCl (NUCYNTA) 75 MG tablet    Sig: Take 1 tablet (75 mg total) by mouth 2 (two) times daily as needed.    Dispense:  45 tablet    Refill:  0    Chronic pain syndrome   Orders Placed This Encounter  Procedures   ToxASSURE Select 13 (MW), Urine    Volume: 30 ml(s). Minimum 3 ml of urine is needed. Document  temperature of fresh sample. Indications: Long term (current) use of opiate analgesic [phone])    Order Specific Question:   Release to patient    Answer:   Immediate     Follow-up plan:   Return in about 15 weeks (around 04/15/2021) for Medication Management, in person.   Recent Visits No visits were found meeting these conditions. Showing recent visits within past 90 days and meeting all other requirements Today's Visits Date Type Provider Dept  12/31/20 Office Visit [name], MD [facility]  Showing today's visits and meeting all other requirements Future Appointments No visits were found meeting these conditions. Showing future appointments within next 90 days and meeting all other requirements I discussed the assessment and treatment plan with the patient. The patient was provided an opportunity to ask questions and all were answered. The patient agreed with the plan and demonstrated an understanding of the instructions.  Patient advised to call back or seek an in-person evaluation if the symptoms or condition worsens.  Duration of encounter: 30 minutes.  Note by: [name], MD Date: 12/31/2020; Time: 1:37 PM

## 2020-12-31 NOTE — Telephone Encounter (Signed)
SPOKE with patient and she is to get [place] another form for they say they can not read the information.

## 2021-01-01 ENCOUNTER — Encounter: Payer: Self-pay | Admitting: *Deleted

## 2021-01-01 ENCOUNTER — Telehealth: Payer: Self-pay | Admitting: *Deleted

## 2021-01-01 NOTE — Telephone Encounter (Signed)
Faxed Attending Physicians Initial Report at [phone]

## 2021-01-03 ENCOUNTER — Telehealth: Payer: Self-pay

## 2021-01-03 LAB — TOXASSURE SELECT 13 (MW), URINE

## 2021-01-03 NOTE — Telephone Encounter (Signed)
Faxed at 2:55pm to the fax number provided with verification showing it went through  Copied from [place] [id number]. Topic: General - Inquiry >> Jan 03, 2021  9:41 AM Greggory Keen D wrote: Reason for CRM: Pt would like Dr. Ancil Boozer nurse to call her regarding a disability form that was sent ot her employer/  CB#  [phone]

## 2021-01-03 NOTE — Telephone Encounter (Signed)
Spoke with pt, answered all questions.

## 2021-01-06 NOTE — Telephone Encounter (Signed)
Patient would like disability forms faxed to [facility] life fax # [phone]

## 2021-01-06 NOTE — Telephone Encounter (Signed)
Yes, I faxed it to [phone] this afternoon around 2:55pm

## 2021-01-09 ENCOUNTER — Telehealth: Payer: Self-pay | Admitting: General Surgery

## 2021-01-09 NOTE — Telephone Encounter (Signed)
Patient is calling and is complaining of having a lot of nausea still and throwing up, patient is asking if we can extend her being out of work said she was suppose to go back on the 15th but is asking if she could have two more weeks out of work and if we could put this in her mychart. Please call patient and advise.

## 2021-01-14 ENCOUNTER — Encounter: Payer: Self-pay | Admitting: General Surgery

## 2021-01-14 ENCOUNTER — Telehealth: Payer: Self-pay

## 2021-01-14 ENCOUNTER — Other Ambulatory Visit: Payer: Self-pay

## 2021-01-14 ENCOUNTER — Ambulatory Visit (INDEPENDENT_AMBULATORY_CARE_PROVIDER_SITE_OTHER): Payer: Federal, State, Local not specified - PPO | Admitting: General Surgery

## 2021-01-14 VITALS — BP 109/78 | HR 93 | Temp 97.9°F | Ht 65.0 in | Wt 170.0 lb

## 2021-01-14 DIAGNOSIS — Z09 Encounter for follow-up examination after completed treatment for conditions other than malignant neoplasm: Secondary | ICD-10-CM

## 2021-01-14 NOTE — Telephone Encounter (Signed)
Copied from [place] [id number]. Topic: General - Other >> Jan 14, 2021  9:23 AM Virl Axe D wrote: Reason for CRM: Pt stated that her disability is being denied again because the disability form is not being filled out correctly. She needs for it to state the date she consulted Dr. Ancil Boozer regarding a referral to a surgeon, (meaning the date when she called the office to request.) This was on 11/12/20 noted in a TE. Form needs to read consult date 11/12/20. Pt is also bringing in another blank form to be filled out. Stated they will keep denying because the consult date being put on the form is after pt had her surgery which is incorrect.

## 2021-01-14 NOTE — Progress Notes (Signed)
Stacey Stanley is here for further surgical follow-up.  She is a 45 year old woman who underwent a robot-assisted laparoscopic cholecystectomy on December 13, 2020.  She says that initially, she had no issues but starting a couple of weeks after surgery, she developed diarrhea and abdominal discomfort as well as some nausea.  She has not experienced any vomiting, fevers, or chills.  She says that due to a prior hemorrhoidectomy that left her without good sphincter control, she has had difficulty controlling her bowels.  She says that as soon as she eats something it seems to go right through her and she is having to wear an adult undergarment to avoid accidental soilage incidents.  She has been taking a probiotic and a product called IB guard for her diarrhea.  She has not tried any other agents such as Imodium.  Today's Vitals   01/14/21 1022  BP: 109/78  Pulse: 93  Temp: 97.9 F (36.6 C)  TempSrc: Oral  SpO2: 98%  Weight: 170 lb (77.1 kg)  Height: '5\' 5"'$  (1.651 m)   Body mass index is 28.29 kg/m. Focused abdominal exam demonstrates well-healed robotic trocar sites.  She has mild discomfort.  Impression and plan: This is a 46 year old woman status post robot-assisted laparoscopic cholecystectomy.  She seems to have developed post cholecystectomy diarrhea.  She has not attempted to control this with Imodium and therefore we will recommend that she start taking 2 over-the-counter tablets twice daily and titrate up or down as needed to regulate diarrhea without becoming constipated.  As far as her nausea and discomfort with eating, I suggested she try over-the-counter Prilosec and see if this makes any difference.  I told her that the diarrhea can take several months to resolve.  She will let us know if she continues to have problems and we will look into other possible sources of her issues at that point.

## 2021-01-14 NOTE — Telephone Encounter (Signed)
Pt brought blank copy, re-filled out necessary/corrected dates and re-faxed.

## 2021-01-14 NOTE — Patient Instructions (Addendum)
You may get Imodium OTC and take 2 caps, 2 times daily. If no relief, you may go up to 3 times daily. Prilosec (OTC) once a day may help with nausea.  If you have any concerns or questions, please feel free to call our office.  Gallbladder Eating Plan If you have a gallbladder condition, you may have trouble digesting fats. Eating a low-fat diet can help reduce your symptoms, and may be helpful before and after having surgery to remove your gallbladder (cholecystectomy). Your health care provider may recommend that you work with a diet and nutrition specialist (dietitian) to help you reduce the amount of fat in your diet. What are tips for following this plan? General guidelines Limit your fat intake to less than 30% of your total daily calories. If you eat around 1,800 calories each day, this is less than 60 grams (g) of fat per day. Fat is an important part of a healthy diet. Eating a low-fat diet can make it hard to maintain a healthy body weight. Ask your dietitian how much fat, calories, and other nutrients you need each day. Eat small, frequent meals throughout the day instead of three large meals. Drink at least 8-10 cups of fluid a day. Drink enough fluid to keep your urine clear or pale yellow. Limit alcohol intake to no more than 1 drink a day for nonpregnant women and 2 drinks a day for men. One drink equals 12 oz of beer, 5 oz of wine, or 1 oz of hard liquor. Reading food labels  Check Nutrition Facts on food labels for the amount of fat per serving. Choose foods with less than 3 grams of fat per serving.  Shopping Choose nonfat and low-fat healthy foods. Look for the words "nonfat," "low fat," or "fat free." Avoid buying processed or prepackaged foods. Cooking Cook using low-fat methods, such as baking, broiling, grilling, or boiling. Cook with small amounts of healthy fats, such as olive oil, grapeseed oil, canola oil, or sunflower oil. What foods are recommended? All fresh,  frozen, or canned fruits and vegetables. Whole grains. Low-fat or non-fat (skim) milk and yogurt. Lean meat, skinless poultry, fish, eggs, and beans. Low-fat protein supplement powders or drinks. Spices and herbs. What foods are not recommended? High-fat foods. These include baked goods, fast food, fatty cuts of meat, ice cream, french toast, sweet rolls, pizza, cheese bread, foods covered with butter, creamy sauces, or cheese. Fried foods. These include french fries, tempura, battered fish, breaded chicken, fried breads, and sweets. Foods with strong odors. Foods that cause bloating and gas. Summary A low-fat diet can be helpful if you have a gallbladder condition, or before and after gallbladder surgery. Limit your fat intake to less than 30% of your total daily calories. This is about 60 g of fat if you eat 1,800 calories each day. Eat small, frequent meals throughout the day instead of three large meals. This information is not intended to replace advice given to you by your health care provider. Make sure you discuss any questions you have with your healthcare provider. Document Revised: 01/04/2020 Document Reviewed: 01/04/2020 Elsevier Patient Education  2022 [place].   Diarrhea, Adult Diarrhea is frequent loose and watery bowel movements. Diarrhea can make you feel weak and cause you to become dehydrated. Dehydration can make you tiredand thirsty, cause you to have a dry mouth, and decrease how often you urinate. Diarrhea typically lasts 2-3 days. However, it can last longer if it is a sign of something  more serious. It is important to treat your diarrhea as told byyour health care provider. Follow these instructions at home: Eating and drinking     Follow these recommendations as told by your health care provider: Take an oral rehydration solution (ORS). This is an over-the-counter medicine that helps return your body to its normal balance of nutrients and water. It is  found at pharmacies and retail stores. Drink plenty of fluids, such as water, ice chips, diluted fruit juice, and low-calorie sports drinks. You can drink milk also, if desired. Avoid drinking fluids that contain a lot of sugar or caffeine, such as energy drinks, sports drinks, and soda. Eat bland, easy-to-digest foods in small amounts as you are able. These foods include bananas, applesauce, rice, lean meats, toast, and crackers. Avoid alcohol. Avoid spicy or fatty foods.  Medicines Take over-the-counter and prescription medicines only as told by your health care provider. If you were prescribed an antibiotic medicine, take it as told by your health care provider. Do not stop using the antibiotic even if you start to feel better. General instructions  Wash your hands often using soap and water. If soap and water are not available, use a hand sanitizer. Others in the household should wash their hands as well. Hands should be washed: After using the toilet or changing a diaper. Before preparing, cooking, or serving food. While caring for a sick person or while visiting someone in a hospital. Drink enough fluid to keep your urine pale yellow. Rest at home while you recover. Watch your condition for any changes. Take a warm bath to relieve any burning or pain from frequent diarrhea episodes. Keep all follow-up visits as told by your health care provider. This is important.  Contact a health care provider if: You have a fever. Your diarrhea gets worse. You have new symptoms. You cannot keep fluids down. You feel light-headed or dizzy. You have a headache. You have muscle cramps. Get help right away if: You have chest pain. You feel extremely weak or you faint. You have bloody or black stools or stools that look like tar. You have severe pain, cramping, or bloating in your abdomen. You have trouble breathing or you are breathing very quickly. Your heart is beating very quickly. Your  skin feels cold and clammy. You feel confused. You have signs of dehydration, such as: Dark urine, very little urine, or no urine. Cracked lips. Dry mouth. Sunken eyes. Sleepiness. Weakness. Summary Diarrhea is frequent loose and watery bowel movements. Diarrhea can make you feel weak and cause you to become dehydrated. Drink enough fluids to keep your urine pale yellow. Make sure that you wash your hands after using the toilet. If soap and water are not available, use hand sanitizer. Contact a health care provider if your diarrhea gets worse or you have new symptoms. Get help right away if you have signs of dehydration. This information is not intended to replace advice given to you by your health care provider. Make sure you discuss any questions you have with your healthcare provider. Document Revised: 10/04/2018 Document Reviewed: 10/22/2017 Elsevier Patient Education  [place].

## 2021-01-18 ENCOUNTER — Other Ambulatory Visit: Payer: Self-pay

## 2021-01-18 ENCOUNTER — Encounter (HOSPITAL_BASED_OUTPATIENT_CLINIC_OR_DEPARTMENT_OTHER): Payer: Self-pay

## 2021-01-18 ENCOUNTER — Emergency Department (HOSPITAL_BASED_OUTPATIENT_CLINIC_OR_DEPARTMENT_OTHER): Payer: Federal, State, Local not specified - PPO

## 2021-01-18 ENCOUNTER — Emergency Department (HOSPITAL_BASED_OUTPATIENT_CLINIC_OR_DEPARTMENT_OTHER)
Admission: EM | Admit: 2021-01-18 | Discharge: 2021-01-18 | Disposition: A | Discharge status: Home / Self Care | Payer: Federal, State, Local not specified - PPO | Attending: Emergency Medicine | Admitting: Emergency Medicine

## 2021-01-18 DIAGNOSIS — Z87891 Personal history of nicotine dependence: Secondary | ICD-10-CM | POA: Diagnosis not present

## 2021-01-18 DIAGNOSIS — E119 Type 2 diabetes mellitus without complications: Secondary | ICD-10-CM | POA: Insufficient documentation

## 2021-01-18 DIAGNOSIS — R110 Nausea: Secondary | ICD-10-CM | POA: Diagnosis not present

## 2021-01-18 DIAGNOSIS — Z79899 Other long term (current) drug therapy: Secondary | ICD-10-CM | POA: Diagnosis not present

## 2021-01-18 DIAGNOSIS — I10 Essential (primary) hypertension: Secondary | ICD-10-CM | POA: Insufficient documentation

## 2021-01-18 DIAGNOSIS — R079 Chest pain, unspecified: Secondary | ICD-10-CM | POA: Diagnosis not present

## 2021-01-18 DIAGNOSIS — R0789 Other chest pain: Secondary | ICD-10-CM | POA: Diagnosis not present

## 2021-01-18 DIAGNOSIS — Z7984 Long term (current) use of oral hypoglycemic drugs: Secondary | ICD-10-CM | POA: Insufficient documentation

## 2021-01-18 LAB — BASIC METABOLIC PANEL
Anion gap: 8 (ref 5–15)
BUN: 10 mg/dL (ref 6–20)
CO2: 25 mmol/L (ref 22–32)
Calcium: 9.8 mg/dL (ref 8.9–10.3)
Chloride: 107 mmol/L (ref 98–111)
Creatinine, Ser: 0.84 mg/dL (ref 0.44–1.00)
GFR, Estimated: >60 mL/min (ref >60)
Glucose, Bld: 134 mg/dL — ABNORMAL HIGH (ref 70–99)
Potassium: 4.1 mmol/L (ref 3.5–5.1)
Sodium: 140 mmol/L (ref 135–145)

## 2021-01-18 LAB — CBC WITH DIFFERENTIAL/PLATELET
Abs Immature Granulocytes: 0.01 10*3/uL (ref 0.00–0.07)
Basophils Absolute: 0.1 10*3/uL (ref 0.0–0.1)
Basophils Relative: 1 %
Eosinophils Absolute: 0.2 10*3/uL (ref 0.0–0.5)
Eosinophils Relative: 3 %
HCT: 40.9 % (ref 36.0–46.0)
Hemoglobin: 13.5 g/dL (ref 12.0–15.0)
Immature Granulocytes: 0 %
Lymphocytes Relative: 62 %
Lymphs Abs: 4.4 10*3/uL — ABNORMAL HIGH (ref 0.7–4.0)
MCH: 28.4 pg (ref 26.0–34.0)
MCHC: 33 g/dL (ref 30.0–36.0)
MCV: 86.1 fL (ref 80.0–100.0)
Monocytes Absolute: 0.3 10*3/uL (ref 0.1–1.0)
Monocytes Relative: 4 %
Neutro Abs: 2.2 10*3/uL (ref 1.7–7.7)
Neutrophils Relative %: 30 %
Platelets: 214 10*3/uL (ref 150–400)
RBC: 4.75 MIL/uL (ref 3.87–5.11)
RDW: 12.3 % (ref 11.5–15.5)
WBC: 7.1 10*3/uL (ref 4.0–10.5)
nRBC: 0 % (ref 0.0–0.2)

## 2021-01-18 LAB — TROPONIN I (HIGH SENSITIVITY): Troponin I (High Sensitivity): <2 ng/L (ref <18)

## 2021-01-18 MED ORDER — ASPIRIN 81 MG PO CHEW
324.0000 mg | CHEWABLE_TABLET | Freq: Once | ORAL | Status: AC
  Administered 2021-01-18: 324 mg via ORAL
  Filled 2021-01-18: qty 4

## 2021-01-18 NOTE — ED Provider Notes (Signed)
[facility] Provider Note: Georgena Spurling, MD, FACEP  CSN: [id number] MRN: [id number] ARRIVAL: 01/18/21 at [place]: [place]  Chest Pain   HISTORY OF PRESENT ILLNESS  01/18/21 3:34 AM Stacey Stanley is a 45 y.o. female who has been having episodes of chest pain for the past 3 days.  Nothing brings these episodes on and when they occur nothing makes her pain better or worse.  She is here because she is having chest pain that began about 10:30 PM yesterday evening and has not gone away.  It is also radiating to her left arm which is new.  She rates the pain as a 5 out of 10 and describes it as a pressure.  She has no associated shortness of breath or diaphoresis.  She states she has nausea but believes this is a separate unrelated issue.  She is a former smoker.  She is on daily Nucynta for chronic pain.   Past Medical History:  Diagnosis Date   Anxiety    no current meds   Arthritis    left knee pain - otc med prn   Chronic insomnia    Depression    Diabetes mellitus without complication ([place])    Dysrhythmia    Home with daughter [name]   Endometriosis    GERD (gastroesophageal reflux disease)    diet control - no meds   Gout    wrist, feet, hands bilateral   Headache(784.0)    hx - last one 2 yrs ago - no meds   History of kidney stones    Hypertension    Metabolic syndrome    Obesity    Right thyroid nodule 2017   benign   Serum calcium elevated    Sleep disorder, circadian, shift work type    SVD (spontaneous vaginal delivery)    x 2   Tachycardia    History - neg cardiac tests-stress related    Past Surgical History:  Procedure Laterality Date   ABDOMINAL HYSTERECTOMY  05/21/2016   COLONOSCOPY  2019   DILATION AND CURETTAGE OF UTERUS     EYE SURGERY  06/28/2013   right laser eye surgery - repair retina   HEMORROIDECTOMY  04/04/2018   Dr. Levin Bacon    HYSTEROSCOPY WITH D & C N/A 07/19/2013   Procedure: DILATATION AND  CURETTAGE Pollyann Glen;  Surgeon: Marvene Staff, MD;  Location: [facility];  Service: Gynecology;  Laterality: N/A;   INDUCED ABORTION     LAPAROSCOPY  1992   endometriosis   LAPAROSCOPY N/A 07/19/2013   Procedure: LAPAROSCOPY DIAGNOSTIC  with resection of endometriosis;  Surgeon: Marvene Staff, MD;  Location: [facility];  Service: Gynecology;  Laterality: N/A;   metatarsil   2003   left foot surgery     Family History  Problem Relation Age of Onset   Diabetes Mother    Hypertension Mother    Hypertension Father    Kidney disease Father    Stroke Maternal Grandmother    Leukemia Paternal Grandmother    Alzheimer's disease Paternal Grandfather    Heart disease Brother    Anesthesia problems Neg Hx    Hypotension Neg Hx    Malignant hyperthermia Neg Hx    Pseudochol deficiency Neg Hx     Social History   Tobacco Use   Smoking status: Former    Packs/day: 0.50    Years: 25.00    Pack years: 12.50    Types: Cigarettes  Start date: 11/25/1993   Smokeless tobacco: Never  Vaping Use   Vaping Use: Former   Start date: 06/01/2014   Quit date: 06/02/2015  Substance Use Topics   Alcohol use: No    Alcohol/week: 0.0 standard drinks   Drug use: No    Prior to Admission medications   Medication Sig Start Date End Date Taking? Authorizing Provider  Dapsone 5 % topical gel Apply 60 g topically daily. 09/21/19   [provider]  hydrochlorothiazide (HYDRODIURIL) 12.5 MG tablet Take 1 tablet (12.5 mg total) by mouth daily. 11/11/20   Steele Sizer, MD  imiquimod [place]) 5 % cream Apply 1 packet topically daily as needed (wart[place]). 09/21/19   [provider]  metFORMIN (GLUCOPHAGE-XR) 750 MG 24 hr tablet Take 1 tablet (750 mg total) by mouth daily with breakfast. 11/11/20   Steele Sizer, MD  nicotine (NICODERM CQ - DOSED IN MG/24 HOURS) 21 mg/24hr patch Place 21 mg onto the skin daily as needed (nicotine).    [provider]  Omega-3 Fatty Acids (FISH  OIL) 1000 MG CAPS Take 2,000 mg by mouth daily.    [provider]  promethazine (PHENERGAN) 25 MG tablet Take 0.5-1 tablets (12.5-25 mg total) by mouth every 6 (six) hours as needed for nausea or vomiting. 11/11/20   Steele Sizer, MD  Semaglutide, 1 MG/DOSE, (OZEMPIC, 1 MG/DOSE,) 2 MG/1.5ML SOPN Inject 1 mg into the skin once a week. 11/11/20   Steele Sizer, MD  tapentadol HCl (NUCYNTA) 75 MG tablet Take 1 tablet (75 mg total) by mouth 2 (two) times daily as needed. 01/22/21 02/21/21  [name], MD  tapentadol HCl (NUCYNTA) 75 MG tablet Take 1 tablet (75 mg total) by mouth 2 (two) times daily as needed. 02/21/21 03/23/21  [name], MD  tapentadol HCl (NUCYNTA) 75 MG tablet Take 1 tablet (75 mg total) by mouth 2 (two) times daily as needed. 03/23/21 04/22/21  [name], MD  TRI-LUMA 0.01-4-0.05 % CREA Apply 30 mg topically daily. 09/21/19   [provider]  vitamin C (ASCORBIC ACID) 500 MG tablet Take 500 mg by mouth daily.    [provider]    Allergies Nuvigil [armodafinil] and Ppd [tuberculin purified protein derivative]   REVIEW OF SYSTEMS  Negative except as noted here or in the History of Present Illness.   PHYSICAL EXAMINATION  Initial Vital Signs Blood pressure 116/71, pulse 85, temperature 98.2 F (36.8 C), temperature source Oral, resp. rate 16, height '5\' 5"'$  (1.651 m), weight 77.1 kg, last menstrual period 07/09/2015, SpO2 100 %.  Examination General: Well-developed, well-nourished female in no acute distress; appearance consistent with age of record HENT: normocephalic; atraumatic Eyes: pupils equal, round and reactive to light; extraocular muscles intact Neck: supple Heart: regular rate and rhythm; no murmurs Lungs: clear to auscultation bilaterally Abdomen: soft; nondistended; nontender; bowel sounds present Extremities: No deformity; full range of motion; pulses normal Neurologic: Awake, alert and oriented; motor function intact  in all extremities and symmetric; no facial droop Skin: Warm and dry Psychiatric: Normal mood and affect   RESULTS  Summary of this visit's results, reviewed and interpreted by myself:   EKG Interpretation  Date/Time:  Saturday January 18 2021 03:41:14 EDT Ventricular Rate:  75 PR Interval:  56 QRS Duration: 84 QT Interval:  401 QTC Calculation: 448 R Axis:   47 Text Interpretation: Sinus rhythm Short PR interval RSR' in V1 or V2, probably normal variant No significant change was found Confirmed by Thamar Holik [phone])  on 01/18/2021 3:48:00 AM       Laboratory Studies: Results for orders placed or performed during the hospital encounter of 01/18/21 (from the past 24 hour(s))  CBC with Differential/Platelet     Status: Abnormal   Collection Time: 01/18/21  3:44 AM  Result Value Ref Range   WBC 7.1 4.0 - 10.5 K/uL   RBC 4.75 3.87 - 5.11 MIL/uL   Hemoglobin 13.5 12.0 - 15.0 g/dL   HCT 40.9 36.0 - 46.0 %   MCV 86.1 80.0 - 100.0 fL   MCH 28.4 26.0 - 34.0 pg   MCHC 33.0 30.0 - 36.0 g/dL   RDW 12.3 11.5 - 15.5 %   Platelets 214 150 - 400 K/uL   nRBC 0.0 0.0 - 0.2 %   Neutrophils Relative % 30 %   Neutro Abs 2.2 1.7 - 7.7 K/uL   Lymphocytes Relative 62 %   Lymphs Abs 4.4 (H) 0.7 - 4.0 K/uL   Monocytes Relative 4 %   Monocytes Absolute 0.3 0.1 - 1.0 K/uL   Eosinophils Relative 3 %   Eosinophils Absolute 0.2 0.0 - 0.5 K/uL   Basophils Relative 1 %   Basophils Absolute 0.1 0.0 - 0.1 K/uL   Immature Granulocytes 0 %   Abs Immature Granulocytes 0.01 0.00 - 0.07 K/uL  Basic metabolic panel     Status: Abnormal   Collection Time: 01/18/21  3:44 AM  Result Value Ref Range   Sodium 140 135 - 145 mmol/L   Potassium 4.1 3.5 - 5.1 mmol/L   Chloride 107 98 - 111 mmol/L   CO2 25 22 - 32 mmol/L   Glucose, Bld 134 (H) 70 - 99 mg/dL   BUN 10 6 - 20 mg/dL   Creatinine, Ser 0.84 0.44 - 1.00 mg/dL   Calcium 9.8 8.9 - 10.3 mg/dL   GFR, Estimated >60 >60 mL/min   Anion gap 8 5 - 15   Troponin I (High Sensitivity)     Status: None   Collection Time: 01/18/21  3:44 AM  Result Value Ref Range   Troponin I (High Sensitivity) <2 <18 ng/L   Imaging Studies: DG Chest Port 1 View  Result Date: 01/18/2021 CLINICAL DATA:  Chest pain EXAM: PORTABLE CHEST 1 VIEW COMPARISON:  05/13/2017 FINDINGS: Normal heart size and mediastinal contours. No acute infiltrate or edema. No effusion or pneumothorax. No acute osseous findings. IMPRESSION: Negative chest Electronically Signed   By: Monte Fantasia M.D.   On: 01/18/2021 04:06    ED COURSE and MDM  Nursing notes, initial and subsequent vitals signs, including pulse oximetry, reviewed and interpreted by myself.  Vitals:   01/18/21 0333 01/18/21 0400 01/18/21 0430 01/18/21 0445  BP: 116/71 113/86 114/79 117/88  Pulse: 85 76 67 74  Resp: '16 15 15 16  '$ Temp: 98.2 F (36.8 C)     TempSrc: Oral     SpO2: 100% 100% 100% 100%  Weight:      Height:       Medications  aspirin chewable tablet 324 mg (324 mg Oral Given 01/18/21 0352)   5:04 AM Patient feeling better but states she does still have chest discomfort coming and going.  First troponin is less than 2 and this was drawn several hours after onset of pain.  5:11 AM Patient advised of low HEART score (3).  She is comfortable being discharged at this time and will follow-up with her PCP or return for worsening symptoms.  Her chest pain is atypical for  cardiac.  PROCEDURES  Procedures   ED DIAGNOSES     ICD-10-CM   1. Chest pain at rest  R07.9          Shanon Rosser, MD 01/18/21 [phone]

## 2021-01-18 NOTE — ED Triage Notes (Signed)
CP on and off for last 3 days but tonight its not getting better.  Reports took an '81mg'$  ASA at 130am. +nausea

## 2021-01-28 ENCOUNTER — Telehealth: Payer: Self-pay | Admitting: *Deleted

## 2021-01-28 NOTE — Telephone Encounter (Signed)
Re- Faxed FMLA to [phone]

## 2021-01-31 ENCOUNTER — Telehealth: Payer: Federal, State, Local not specified - PPO | Admitting: Physician Assistant

## 2021-01-31 DIAGNOSIS — J069 Acute upper respiratory infection, unspecified: Secondary | ICD-10-CM | POA: Diagnosis not present

## 2021-01-31 MED ORDER — FLUTICASONE PROPIONATE 50 MCG/ACT NA SUSP: 2.0000 | Freq: Every day | NASAL | 0 refills | Status: DC

## 2021-01-31 MED ORDER — BENZONATATE 100 MG PO CAPS: 100.0000 mg | ORAL_CAPSULE | Freq: Three times a day (TID) | ORAL | 0 refills | Status: DC | PRN

## 2021-01-31 MED ORDER — ALBUTEROL SULFATE HFA 108 (90 BASE) MCG/ACT IN AERS
2.0000 | INHALATION_SPRAY | Freq: Four times a day (QID) | RESPIRATORY_TRACT | 0 refills | Status: DC | PRN
Start: 2021-01-31 — End: 2021-03-24

## 2021-01-31 NOTE — Progress Notes (Signed)
E-Visit for Upper Respiratory Infection   We are sorry you are not feeling well.  Here is how we plan to help!  Based on what you have shared with me, it looks like you may have a viral upper respiratory infection.  Upper respiratory infections are caused by a large number of viruses; however, rhinovirus is the most common cause. It is possible it is still Covid 19, since you have had such a strong exposure. You could test every day or two while symptomatic if desired.   Symptoms vary from person to person, with common symptoms including sore throat, cough, fatigue or lack of energy and feeling of general discomfort.  A low-grade fever of up to 100.4 may present, but is often uncommon.  Symptoms vary however, and are closely related to a person's age or underlying illnesses.  The most common symptoms associated with an upper respiratory infection are nasal discharge or congestion, cough, sneezing, headache and pressure in the ears and face.  These symptoms usually persist for about 3 to 10 days, but can last up to 2 weeks.  It is important to know that upper respiratory infections do not cause serious illness or complications in most cases.    Upper respiratory infections can be transmitted from person to person, with the most common method of transmission being a person's hands.  The virus is able to live on the skin and can infect other persons for up to 2 hours after direct contact.  Also, these can be transmitted when someone coughs or sneezes; thus, it is important to cover the mouth to reduce this risk.  To keep the spread of the illness at [place], good hand hygiene is very important.  This is an infection that is most likely caused by a virus. There are no specific treatments other than to help you with the symptoms until the infection runs its course.  We are sorry you are not feeling well.  Here is how we plan to help!   For nasal congestion, you may use an oral decongestants such as Mucinex D or  if you have glaucoma or high blood pressure use plain Mucinex.  Saline nasal spray or nasal drops can help and can safely be used as often as needed for congestion.  For your congestion, I have prescribed Fluticasone nasal spray one spray in each nostril twice a day  If you do not have a history of heart disease, hypertension, diabetes or thyroid disease, prostate/bladder issues or glaucoma, you may also use Sudafed to treat nasal congestion.  It is highly recommended that you consult with a pharmacist or your primary care physician to ensure this medication is safe for you to take.     If you have a cough, you may use cough suppressants such as Delsym and Robitussin.  If you have glaucoma or high blood pressure, you can also use Coricidin HBP.   For cough I have prescribed for you A prescription cough medication called Tessalon Perles 100 mg. You may take 1-2 capsules every 8 hours as needed for cough  I have also prescribed Albuterol inhaler Take 1-2 puffs every 4-6 hours as needed for shortness of breath or wheezing  If you have a sore or scratchy throat, use a saltwater gargle-  to  teaspoon of salt dissolved in a 4-ounce to 8-ounce glass of warm water.  Gargle the solution for approximately 15-30 seconds and then spit.  It is important not to swallow the solution.  You can  also use throat lozenges/cough drops and Chloraseptic spray to help with throat pain or discomfort.  Warm or cold liquids can also be helpful in relieving throat pain.  For headache, pain or general discomfort, you can use Ibuprofen or Tylenol as directed.   Some authorities believe that zinc sprays or the use of Echinacea may shorten the course of your symptoms.   HOME CARE Only take medications as instructed by your medical team. Be sure to drink plenty of fluids. Water is fine as well as fruit juices, sodas and electrolyte beverages. You may want to stay away from caffeine or alcohol. If you are nauseated, try taking  small sips of liquids. How do you know if you are getting enough fluid? Your urine should be a pale yellow or almost colorless. Get rest. Taking a steamy shower or using a humidifier may help nasal congestion and ease sore throat pain. You can place a towel over your head and breathe in the steam from hot water coming from a faucet. Using a saline nasal spray works much the same way. Cough drops, hard candies and sore throat lozenges may ease your cough. Avoid close contacts especially the very young and the elderly Cover your mouth if you cough or sneeze Always remember to wash your hands.   GET HELP RIGHT AWAY IF: You develop worsening fever. If your symptoms do not improve within 10 days You develop yellow or green discharge from your nose over 3 days. You have coughing fits You develop a severe head ache or visual changes. You develop shortness of breath, difficulty breathing or start having chest pain Your symptoms persist after you have completed your treatment plan  MAKE SURE YOU  Understand these instructions. Will watch your condition. Will get help right away if you are not doing well or get worse.  Thank you for choosing an e-visit.  Your e-visit answers were reviewed by a board certified advanced clinical practitioner to complete your personal care plan. Depending upon the condition, your plan could have included both over the counter or prescription medications.  Please review your pharmacy choice. Make sure the pharmacy is open so you can pick up prescription now. If there is a problem, you may contact your provider through [employer] and have the prescription routed to another pharmacy.  Your safety is important to [place]. If you have drug allergies check your prescription carefully.   For the next 24 hours you can use MyChart to ask questions about today's visit, request a non-urgent call back, or ask for a work or school excuse. You will get an email in the next two  days asking about your experience. I hope that your e-visit has been valuable and will speed your recovery.    I provided 5 minutes of non face-to-face time during this encounter for chart review and documentation.

## 2021-02-10 ENCOUNTER — Other Ambulatory Visit: Payer: Self-pay

## 2021-02-10 ENCOUNTER — Ambulatory Visit
Payer: Federal, State, Local not specified - PPO | Attending: Student in an Organized Health Care Education/Training Program | Admitting: Student in an Organized Health Care Education/Training Program

## 2021-02-10 DIAGNOSIS — G894 Chronic pain syndrome: Secondary | ICD-10-CM

## 2021-02-10 NOTE — Progress Notes (Signed)
No appointment needed.

## 2021-02-11 ENCOUNTER — Telehealth: Payer: Self-pay

## 2021-02-11 NOTE — Telephone Encounter (Signed)
Medications sent to a different pharmacy.

## 2021-02-13 DIAGNOSIS — R159 Full incontinence of feces: Secondary | ICD-10-CM | POA: Diagnosis not present

## 2021-02-13 DIAGNOSIS — N9412 Deep dyspareunia: Secondary | ICD-10-CM | POA: Diagnosis not present

## 2021-02-13 DIAGNOSIS — N9411 Superficial (introital) dyspareunia: Secondary | ICD-10-CM | POA: Diagnosis not present

## 2021-02-13 DIAGNOSIS — K9089 Other intestinal malabsorption: Secondary | ICD-10-CM | POA: Diagnosis not present

## 2021-02-13 DIAGNOSIS — R140 Abdominal distension (gaseous): Secondary | ICD-10-CM | POA: Diagnosis not present

## 2021-02-13 DIAGNOSIS — M62838 Other muscle spasm: Secondary | ICD-10-CM | POA: Diagnosis not present

## 2021-02-18 ENCOUNTER — Telehealth: Payer: Federal, State, Local not specified - PPO | Admitting: Physician Assistant

## 2021-02-18 DIAGNOSIS — H1032 Unspecified acute conjunctivitis, left eye: Secondary | ICD-10-CM

## 2021-02-18 MED ORDER — POLYMYXIN B-TRIMETHOPRIM 10000-0.1 UNIT/ML-% OP SOLN: OPHTHALMIC | 0 refills | Status: DC

## 2021-02-18 NOTE — Progress Notes (Signed)
I have spent 5 minutes in review of e-visit questionnaire, review and updating patient chart, medical decision making and response to patient.   Dashun Borre Cody Johnetta Sloniker, PA-C    

## 2021-02-18 NOTE — Progress Notes (Signed)
E-Visit for Pink Eye ? ? ?We are sorry that you are not feeling well.  Here is how we plan to help! ? ?Based on what you have shared with me it looks like you have conjunctivitis.  Conjunctivitis is a common inflammatory or infectious condition of the eye that is often referred to as "pink eye".  In most cases it is contagious (viral or bacterial). However, not all conjunctivitis requires antibiotics (ex. Allergic).  We have made appropriate suggestions for you based upon your presentation. ? ?I have prescribed Polytrim Ophthalmic drops 1-2 drops 4 times a day times 5 days ? ?Pink eye can be highly contagious.  It is typically spread through direct contact with secretions, or contaminated objects or surfaces that one may have touched.  Strict handwashing is suggested with soap and water is urged.  If not available, use alcohol based had sanitizer.  Avoid unnecessary touching of the eye.  If you wear contact lenses, you will need to refrain from wearing them until you see no white discharge from the eye for at least 24 hours after being on medication.  You should see symptom improvement in 1-2 days after starting the medication regimen.  Call us if symptoms are not improved in 1-2 days. ? ?Home Care: ?Wash your hands often! ?Do not wear your contacts until you complete your treatment plan. ?Avoid sharing towels, bed linen, personal items with a person who has pink eye. ?See attention for anyone in your home with similar symptoms. ? ?Get Help Right Away If: ?Your symptoms do not improve. ?You develop blurred or loss of vision. ?Your symptoms worsen (increased discharge, pain or redness) ? ? ?Thank you for choosing an e-visit. ? ?Your e-visit answers were reviewed by a board certified advanced clinical practitioner to complete your personal care plan. Depending upon the condition, your plan could have included both over the counter or prescription medications. ? ?Please review your pharmacy choice. Make sure the  pharmacy is open so you can pick up prescription now. If there is a problem, you may contact your provider through MyChart messaging and have the prescription routed to another pharmacy.  Your safety is important to us. If you have drug allergies check your prescription carefully.  ? ?For the next 24 hours you can use MyChart to ask questions about today's visit, request a non-urgent call back, or ask for a work or school excuse. ?You will get an email in the next two days asking about your experience. I hope that your e-visit has been valuable and will speed your recovery. ? ?

## 2021-02-27 ENCOUNTER — Other Ambulatory Visit: Payer: Self-pay

## 2021-02-27 ENCOUNTER — Emergency Department (HOSPITAL_BASED_OUTPATIENT_CLINIC_OR_DEPARTMENT_OTHER)
Admission: EM | Admit: 2021-02-27 | Discharge: 2021-02-27 | Disposition: A | Discharge status: Home / Self Care | Payer: Federal, State, Local not specified - PPO | Attending: Emergency Medicine | Admitting: Emergency Medicine

## 2021-02-27 ENCOUNTER — Encounter (HOSPITAL_BASED_OUTPATIENT_CLINIC_OR_DEPARTMENT_OTHER): Payer: Self-pay

## 2021-02-27 ENCOUNTER — Emergency Department (HOSPITAL_BASED_OUTPATIENT_CLINIC_OR_DEPARTMENT_OTHER): Payer: Federal, State, Local not specified - PPO | Admitting: Radiology

## 2021-02-27 DIAGNOSIS — R0789 Other chest pain: Secondary | ICD-10-CM

## 2021-02-27 DIAGNOSIS — I10 Essential (primary) hypertension: Secondary | ICD-10-CM | POA: Insufficient documentation

## 2021-02-27 DIAGNOSIS — E119 Type 2 diabetes mellitus without complications: Secondary | ICD-10-CM | POA: Diagnosis not present

## 2021-02-27 DIAGNOSIS — Z87891 Personal history of nicotine dependence: Secondary | ICD-10-CM | POA: Insufficient documentation

## 2021-02-27 DIAGNOSIS — Z79899 Other long term (current) drug therapy: Secondary | ICD-10-CM | POA: Insufficient documentation

## 2021-02-27 DIAGNOSIS — R079 Chest pain, unspecified: Secondary | ICD-10-CM | POA: Diagnosis not present

## 2021-02-27 DIAGNOSIS — Z7984 Long term (current) use of oral hypoglycemic drugs: Secondary | ICD-10-CM | POA: Insufficient documentation

## 2021-02-27 LAB — BASIC METABOLIC PANEL
Anion gap: 8 (ref 5–15)
BUN: 13 mg/dL (ref 6–20)
CO2: 27 mmol/L (ref 22–32)
Calcium: 10.2 mg/dL (ref 8.9–10.3)
Chloride: 106 mmol/L (ref 98–111)
Creatinine, Ser: 0.83 mg/dL (ref 0.44–1.00)
GFR, Estimated: >60 mL/min (ref >60)
Glucose, Bld: 98 mg/dL (ref 70–99)
Potassium: 3.9 mmol/L (ref 3.5–5.1)
Sodium: 141 mmol/L (ref 135–145)

## 2021-02-27 LAB — CBC
HCT: 41.5 % (ref 36.0–46.0)
Hemoglobin: 13.6 g/dL (ref 12.0–15.0)
MCH: 28.5 pg (ref 26.0–34.0)
MCHC: 32.8 g/dL (ref 30.0–36.0)
MCV: 86.8 fL (ref 80.0–100.0)
Platelets: 282 10*3/uL (ref 150–400)
RBC: 4.78 MIL/uL (ref 3.87–5.11)
RDW: 12.8 % (ref 11.5–15.5)
WBC: 8.5 10*3/uL (ref 4.0–10.5)
nRBC: 0 % (ref 0.0–0.2)

## 2021-02-27 LAB — TROPONIN I (HIGH SENSITIVITY)
Troponin I (High Sensitivity): <2 ng/L (ref <18)
Troponin I (High Sensitivity): <2 ng/L (ref <18)

## 2021-02-27 NOTE — ED Provider Notes (Signed)
[facility] Provider Note: Stacey Spurling, MD, FACEP  CSN: [id number] MRN: [id number] ARRIVAL: 02/27/21 at [place]: [place]  Chest Pain   HISTORY OF PRESENT ILLNESS  02/27/21 3:53 AM Stacey Stanley is a 45 y.o. female who has had chest pain for the past 3 weeks.  The pain is described as substernal and feels like a deep, deep dull pain.  It is sometimes radiates into the left upper chest but not the arm.  The pain is usually present but sometimes is not present.  Nothing makes it worse or better.  Nothing brings the pain on.  There is no associated shortness of breath, diaphoresis, nausea or vomiting.    Past Medical History:  Diagnosis Date   Anxiety    no current meds   Arthritis    left knee pain - otc med prn   Chronic insomnia    Depression    Diabetes mellitus without complication ([place])    Dysrhythmia    Home with daughter Stacey Stanley   Endometriosis    GERD (gastroesophageal reflux disease)    diet control - no meds   Gout    wrist, feet, hands bilateral   Headache(784.0)    hx - last one 2 yrs ago - no meds   History of kidney stones    Hypertension    Metabolic syndrome    Obesity    Right thyroid nodule 2017   benign   Serum calcium elevated    Sleep disorder, circadian, shift work type    SVD (spontaneous vaginal delivery)    x 2   Tachycardia    History - neg cardiac tests-stress related    Past Surgical History:  Procedure Laterality Date   ABDOMINAL HYSTERECTOMY  05/21/2016   COLONOSCOPY  2019   DILATION AND CURETTAGE OF UTERUS     EYE SURGERY  06/28/2013   right laser eye surgery - repair retina   HEMORROIDECTOMY  04/04/2018   Dr. Levin Bacon    HYSTEROSCOPY WITH D & C N/A 07/19/2013   Procedure: DILATATION AND CURETTAGE [place];  Surgeon: Marvene Staff, MD;  Location: [facility];  Service: Gynecology;  Laterality: N/A;   INDUCED ABORTION     LAPAROSCOPY  1992   endometriosis   LAPAROSCOPY N/A  07/19/2013   Procedure: LAPAROSCOPY DIAGNOSTIC  with resection of endometriosis;  Surgeon: Marvene Staff, MD;  Location: [facility];  Service: Gynecology;  Laterality: N/A;   metatarsil   2003   left foot surgery     Family History  Problem Relation Age of Onset   Diabetes Mother    Hypertension Mother    Hypertension Father    Kidney disease Father    Stroke Maternal Grandmother    Leukemia Paternal Grandmother    Alzheimer's disease Paternal Grandfather    Heart disease Brother    Anesthesia problems Neg Hx    Hypotension Neg Hx    Malignant hyperthermia Neg Hx    Pseudochol deficiency Neg Hx     Social History   Tobacco Use   Smoking status: Former    Packs/day: 0.50    Years: 25.00    Pack years: 12.50    Types: Cigarettes    Start date: 11/25/1993   Smokeless tobacco: Never  Vaping Use   Vaping Use: Former   Start date: 06/01/2014   Quit date: 06/02/2015  Substance Use Topics   Alcohol use: No    Alcohol/week: 0.0 standard drinks  Drug use: No    Prior to Admission medications   Medication Sig Start Date End Date Taking? Authorizing Provider  albuterol (VENTOLIN HFA) 108 (90 Base) MCG/ACT inhaler Inhale 2 puffs into the lungs every 6 (six) hours as needed for wheezing or shortness of breath. 01/31/21   Mar Daring, PA-C  benzonatate (TESSALON) 100 MG capsule Take 1 capsule (100 mg total) by mouth 3 (three) times daily as needed. 01/31/21   Mar Daring, PA-C  Dapsone 5 % topical gel Apply 60 g topically daily. 09/21/19   [provider]  fluticasone (FLONASE) 50 MCG/ACT nasal spray Place 2 sprays into both nostrils daily. 01/31/21   Mar Daring, PA-C  hydrochlorothiazide (HYDRODIURIL) 12.5 MG tablet Take 1 tablet (12.5 mg total) by mouth daily. 11/11/20   Steele Sizer, MD  imiquimod Leroy Sea) 5 % cream Apply 1 packet topically daily as needed (warts). 09/21/19   [provider]  metFORMIN (GLUCOPHAGE-XR) 750 MG 24 hr tablet  Take 1 tablet (750 mg total) by mouth daily with breakfast. 11/11/20   Steele Sizer, MD  nicotine (NICODERM CQ - DOSED IN MG/24 HOURS) 21 mg/24hr patch Place 21 mg onto the skin daily as needed (nicotine).    [provider]  Omega-3 Fatty Acids (FISH OIL) 1000 MG CAPS Take 2,000 mg by mouth daily.    [provider]  promethazine (PHENERGAN) 25 MG tablet Take 0.5-1 tablets (12.5-25 mg total) by mouth every 6 (six) hours as needed for nausea or vomiting. 11/11/20   Steele Sizer, MD  Semaglutide, 1 MG/DOSE, (OZEMPIC, 1 MG/DOSE,) 2 MG/1.5ML SOPN Inject 1 mg into the skin once a week. 11/11/20   Steele Sizer, MD  tapentadol HCl (NUCYNTA) 75 MG tablet Take 1 tablet (75 mg total) by mouth 2 (two) times daily as needed. 02/21/21 03/23/21  [name], MD  tapentadol HCl (NUCYNTA) 75 MG tablet Take 1 tablet (75 mg total) by mouth 2 (two) times daily as needed. 03/23/21 04/22/21  [name], MD  TRI-LUMA 0.01-4-0.05 % CREA Apply 30 mg topically daily. 09/21/19   [provider]  trimethoprim-polymyxin b (POLYTRIM) ophthalmic solution Apply 1-2 drops into affected eye QID x 5 days. 02/18/21   Brunetta Jeans, PA-C  vitamin C (ASCORBIC ACID) 500 MG tablet Take 500 mg by mouth daily.    [provider]    Allergies Nuvigil [armodafinil] and Ppd [tuberculin purified protein derivative]   REVIEW OF SYSTEMS  Negative except as noted here or in the History of Present Illness.   PHYSICAL EXAMINATION  Initial Vital Signs Blood pressure 107/76, pulse 75, temperature 98.2 F (36.8 C), temperature source Oral, resp. rate 16, height 5\' 5"  (1.651 m), weight 77.1 kg, last menstrual period 07/09/2015, SpO2 100 %.  Examination General: Well-developed, well-nourished female in no acute distress; appearance consistent with age of record HENT: normocephalic; atraumatic Eyes: pupils equal, round and reactive to light; extraocular muscles intact Neck: supple Heart:  regular rate and rhythm; no murmur Lungs: clear to auscultation bilaterally Abdomen: soft; nondistended; nontender; bowel sounds present Extremities: No deformity; full range of motion; pulses normal Neurologic: Awake, alert and oriented; motor function intact in all extremities and symmetric; no facial droop Skin: Warm and dry Psychiatric: Normal mood and affect   RESULTS  Summary of this visit's results, reviewed and interpreted by myself:   EKG Interpretation  Date/Time:  Thursday February 27 2021 01:25:02 EDT Ventricular Rate:  81 PR Interval:  154 QRS Duration: 72 QT Interval:  360 QTC Calculation: 418 R Axis:   61 Text Interpretation: Normal sinus rhythm Normal ECG No significant change was found Confirmed by Augusten Lipkin, Jenny Reichmann [phone]) on 02/27/2021 3:34:53 AM       Laboratory Studies: Results for orders placed or performed during the hospital encounter of 02/27/21 (from the past 24 hour(s))  Basic metabolic panel     Status: None   Collection Time: 02/27/21  1:59 AM  Result Value Ref Range   Sodium 141 135 - 145 mmol/L   Potassium 3.9 3.5 - 5.1 mmol/L   Chloride 106 98 - 111 mmol/L   CO2 27 22 - 32 mmol/L   Glucose, Bld 98 70 - 99 mg/dL   BUN 13 6 - 20 mg/dL   Creatinine, Ser 0.83 0.44 - 1.00 mg/dL   Calcium 10.2 8.9 - 10.3 mg/dL   GFR, Estimated >60 >60 mL/min   Anion gap 8 5 - 15  CBC     Status: None   Collection Time: 02/27/21  1:59 AM  Result Value Ref Range   WBC 8.5 4.0 - 10.5 K/uL   RBC 4.78 3.87 - 5.11 MIL/uL   Hemoglobin 13.6 12.0 - 15.0 g/dL   HCT 41.5 36.0 - 46.0 %   MCV 86.8 80.0 - 100.0 fL   MCH 28.5 26.0 - 34.0 pg   MCHC 32.8 30.0 - 36.0 g/dL   RDW 12.8 11.5 - 15.5 %   Platelets 282 150 - 400 K/uL   nRBC 0.0 0.0 - 0.2 %  Troponin I (High Sensitivity)     Status: None   Collection Time: 02/27/21  1:59 AM  Result Value Ref Range   Troponin I (High Sensitivity) <2 <18 ng/L  Troponin I (High Sensitivity)     Status: None   Collection Time:  02/27/21  3:30 AM  Result Value Ref Range   Troponin I (High Sensitivity) <2 <18 ng/L   Imaging Studies: DG Chest 2 View  Result Date: 02/27/2021 CLINICAL DATA:  Chest pain EXAM: CHEST - 2 VIEW COMPARISON:  01/18/2021 FINDINGS: Lungs are clear.  No pleural effusion or pneumothorax. The heart is normal in size. Visualized osseous structures are within normal limits. IMPRESSION: Normal chest radiographs. Electronically Signed   By: Julian Hy M.D.   On: 02/27/2021 01:54    ED COURSE and MDM  Nursing notes, initial and subsequent vitals signs, including pulse oximetry, reviewed and interpreted by myself.  Vitals:   02/27/21 0125 02/27/21 0126 02/27/21 0317 02/27/21 0330  BP: (!) 126/98  109/81 107/76  Pulse: 87  77 75  Resp: 16  (!) 21 16  Temp: 98.2 F (36.8 C)     TempSrc: Oral     SpO2: 100%  100% 100%  Weight:  77.1 kg    Height:  5\' 5"  (1.651 m)     Medications - No data to display  The patient was advised of reassuring diagnostic studies.  The cause of her pain is unclear but does not appear to be cardiac.  She will follow-up with her primary care physician for further evaluation.  PROCEDURES  Procedures   ED DIAGNOSES     ICD-10-CM   1. Atypical chest pain  R07.89          Shanon Rosser, MD 02/27/21 [phone]

## 2021-02-27 NOTE — ED Triage Notes (Signed)
Patient here POV from Home with CP.  Patient states Pain began approximately 2 weeks ago intermittently. Patient has attempted Antiacids in an attempt to reduce Pain with No Success. Pain is primarily located in the Mid-Chest and radiates to Mid Chest and Left Upper Flank.  Ambulatory. NAD Noted during Triage. GCS 15. No N/V. Mild SOB when lying Flat.

## 2021-02-28 ENCOUNTER — Encounter: Payer: Self-pay | Admitting: General Surgery

## 2021-02-28 DIAGNOSIS — H10412 Chronic giant papillary conjunctivitis, left eye: Secondary | ICD-10-CM | POA: Diagnosis not present

## 2021-03-11 DIAGNOSIS — K219 Gastro-esophageal reflux disease without esophagitis: Secondary | ICD-10-CM | POA: Diagnosis not present

## 2021-03-11 DIAGNOSIS — R1013 Epigastric pain: Secondary | ICD-10-CM | POA: Diagnosis not present

## 2021-03-11 DIAGNOSIS — K9089 Other intestinal malabsorption: Secondary | ICD-10-CM | POA: Diagnosis not present

## 2021-03-11 DIAGNOSIS — R1033 Periumbilical pain: Secondary | ICD-10-CM | POA: Diagnosis not present

## 2021-03-12 ENCOUNTER — Other Ambulatory Visit (HOSPITAL_COMMUNITY): Payer: Self-pay | Admitting: Gastroenterology

## 2021-03-12 DIAGNOSIS — R1013 Epigastric pain: Secondary | ICD-10-CM

## 2021-03-13 ENCOUNTER — Ambulatory Visit (HOSPITAL_COMMUNITY)
Admission: RE | Admit: 2021-03-13 | Discharge: 2021-03-13 | Disposition: A | Discharge status: Home / Self Care | Payer: Federal, State, Local not specified - PPO | Source: Ambulatory Visit | Attending: Gastroenterology | Admitting: Gastroenterology

## 2021-03-13 ENCOUNTER — Other Ambulatory Visit: Payer: Self-pay

## 2021-03-13 DIAGNOSIS — M62838 Other muscle spasm: Secondary | ICD-10-CM | POA: Diagnosis not present

## 2021-03-13 DIAGNOSIS — N9411 Superficial (introital) dyspareunia: Secondary | ICD-10-CM | POA: Diagnosis not present

## 2021-03-13 DIAGNOSIS — R1110 Vomiting, unspecified: Secondary | ICD-10-CM | POA: Diagnosis not present

## 2021-03-13 DIAGNOSIS — N9412 Deep dyspareunia: Secondary | ICD-10-CM | POA: Diagnosis not present

## 2021-03-13 DIAGNOSIS — R159 Full incontinence of feces: Secondary | ICD-10-CM | POA: Diagnosis not present

## 2021-03-13 DIAGNOSIS — R1013 Epigastric pain: Secondary | ICD-10-CM | POA: Diagnosis not present

## 2021-03-13 MED ORDER — IOHEXOL 350 MG/ML SOLN
80.0000 mL | Freq: Once | INTRAVENOUS | Status: AC | PRN
  Administered 2021-03-13: 80 mL via INTRAVENOUS

## 2021-03-18 DIAGNOSIS — N9412 Deep dyspareunia: Secondary | ICD-10-CM | POA: Diagnosis not present

## 2021-03-18 DIAGNOSIS — M62838 Other muscle spasm: Secondary | ICD-10-CM | POA: Diagnosis not present

## 2021-03-18 DIAGNOSIS — R159 Full incontinence of feces: Secondary | ICD-10-CM | POA: Diagnosis not present

## 2021-03-18 DIAGNOSIS — N9411 Superficial (introital) dyspareunia: Secondary | ICD-10-CM | POA: Diagnosis not present

## 2021-03-24 ENCOUNTER — Ambulatory Visit: Payer: Federal, State, Local not specified - PPO | Admitting: Surgery

## 2021-03-24 ENCOUNTER — Other Ambulatory Visit: Payer: Self-pay

## 2021-03-24 ENCOUNTER — Encounter: Payer: Self-pay | Admitting: Surgery

## 2021-03-24 VITALS — BP 140/93 | HR 90 | Temp 98.7°F | Ht 65.0 in | Wt 167.6 lb

## 2021-03-24 DIAGNOSIS — R1013 Epigastric pain: Secondary | ICD-10-CM

## 2021-03-24 DIAGNOSIS — K915 Postcholecystectomy syndrome: Secondary | ICD-10-CM

## 2021-03-24 NOTE — Progress Notes (Signed)
03/24/2021  History of Present Illness: Stacey Stanley is a 45 y.o. female s/p robotic assisted cholecystectomy with Dr. Celine Ahr on 12/13/20 for symptomatic cholelithiasis.  She presents today for further follow up.  She's having two main issues at this point.  One is that her epigastric pain never resolved after surgery.  She reports that before surgery, her pain was epigastric and it happened after any po intake.  There was no mention of pain going up her chest on the initial visit with Dr. Celine Ahr, but the patient reports that she's been recently to the ER in August and September for chest pain that started in the high epigastric area and went up her chest.  Both times her EKG and troponin were negative.  She reports the pain happening after eating, and it does not matter what she eats.  She has tried taking Tums which helps but has not tried any daily PPI or H2 blocker as she did not want to self-medicate without asking a doctor first.  Reports nausea with the pain as well.  Her other issue is a combination of diarrhea and constipation.  After surgery, she had diarrhea which was very significant and she was started on Cholestyramine.  She reports that she was taking it three times a day and appears to have subsequently caused constipation.  Her GI provider advised her to stop the medication, and now she's having diarrhea again.  She has issues with rectal sphincter tone as a result of a hemorrhoidectomy, and so with the diarrhea, she has issues with incontinence.  She had a recent CT scan of abdomen and pelvis which was ordered by her GI provider.  I have personally viewed the images and agree with the findings.  There's no acute pathology intra-abdominally.  No hernias, distended loops of bowel or stomach, and the contrast reached her right colon prior to images.  There is some stool burden which is consistent with the constipation she was having.    Of note, she also has a history of chronic pelvic  pain from endometriosis.  She has a prior laparoscopic hysterectomy and a laparoscopic resection of endometriosis.  Past Medical History: Past Medical History:  Diagnosis Date   Anxiety    no current meds   Arthritis    left knee pain - otc med prn   Chronic insomnia    Depression    Diabetes mellitus without complication ([place])    Dysrhythmia    Home with daughter [name]   Endometriosis    GERD (gastroesophageal reflux disease)    diet control - no meds   Gout    wrist, feet, hands bilateral   Headache(784.0)    hx - last one 2 yrs ago - no meds   History of kidney stones    Hypertension    Metabolic syndrome    Obesity    Right thyroid nodule 2017   benign   Serum calcium elevated    Sleep disorder, circadian, shift work type    SVD (spontaneous vaginal delivery)    x 2   Tachycardia    History - neg cardiac tests-stress related     Past Surgical History: Past Surgical History:  Procedure Laterality Date   ABDOMINAL HYSTERECTOMY  05/21/2016   COLONOSCOPY  2019   DILATION AND CURETTAGE OF UTERUS     EYE SURGERY  06/28/2013   right laser eye surgery - repair retina   HEMORROIDECTOMY  04/04/2018   Dr. Levin Bacon  HYSTEROSCOPY WITH D & C N/A 07/19/2013   Procedure: DILATATION AND CURETTAGE /HYSTEROSCOPY;  Surgeon: Marvene Staff, MD;  Location: [facility];  Service: Gynecology;  Laterality: N/A;   INDUCED ABORTION     LAPAROSCOPY  1992   endometriosis   LAPAROSCOPY N/A 07/19/2013   Procedure: LAPAROSCOPY DIAGNOSTIC  with resection of endometriosis;  Surgeon: Marvene Staff, MD;  Location: [facility];  Service: Gynecology;  Laterality: N/A;   metatarsil   2003   left foot surgery     Home Medications: Prior to Admission medications   Medication Sig Start Date End Date Taking? Authorizing Provider  Dapsone 5 % topical gel Apply 60 g topically daily. 09/21/19  Yes [provider]  hydrochlorothiazide (HYDRODIURIL) 12.5 MG tablet Take 1 tablet  (12.5 mg total) by mouth daily. 11/11/20  Yes Sowles, Drue Stager, MD  imiquimod Leroy Sea) 5 % cream Apply 1 packet topically daily as needed (warts). 09/21/19  Yes [provider]  metFORMIN (GLUCOPHAGE-XR) 750 MG 24 hr tablet Take 1 tablet (750 mg total) by mouth daily with breakfast. 11/11/20  Yes Sowles, Drue Stager, MD  nicotine (NICODERM CQ - DOSED IN MG/24 HOURS) 21 mg/24hr patch Place 21 mg onto the skin daily as needed (nicotine).   Yes [provider]  Omega-3 Fatty Acids (FISH OIL) 1000 MG CAPS Take 2,000 mg by mouth daily.   Yes [provider]  tapentadol HCl (NUCYNTA) 75 MG tablet Take 1 tablet (75 mg total) by mouth 2 (two) times daily as needed. 03/23/21 04/22/21 Yes Lateef, Carlus Pavlov, MD  TRI-LUMA 0.01-4-0.05 % CREA Apply 30 mg topically daily. 09/21/19  Yes [provider]  vitamin C (ASCORBIC ACID) 500 MG tablet Take 500 mg by mouth daily.   Yes [provider]    Allergies: Allergies  Allergen Reactions   Nuvigil [Armodafinil]     Chest pain    Ppd [Tuberculin Purified Protein Derivative]     Review of Systems: Review of Systems  Constitutional:  Negative for chills and fever.  Respiratory:  Negative for shortness of breath.   Cardiovascular:  Positive for chest pain (Cardiac workup negative).  Gastrointestinal:  Positive for abdominal pain, diarrhea, heartburn and nausea.  Skin:  Negative for rash.   Physical Exam BP (!) 140/93   Pulse 90   Temp 98.7 F (37.1 C) (Oral)   Ht 5\' 5"  (1.651 m)   Wt 167 lb 9.6 oz (76 kg)   LMP 07/09/2015 (Exact Date)   SpO2 98%   BMI 27.89 kg/m  CONSTITUTIONAL: No acute distress, well nourished. HEENT:  Normocephalic, atraumatic, extraocular motion intact. RESPIRATORY:  Normal respiratory effort without pathologic use of accessory muscles. CARDIOVASCULAR:  Regular rhythm and rate. GI: The abdomen is soft, non-distended, with tenderness to palpation mostly in the epigastric region.  There's  discomfort in the pelvic region from her endometriosis, and some discomfort in the RUQ, but most of her pain is high epigastric.  Prior incisions are well healed, without hernia.  NEUROLOGIC:  Motor and sensation is grossly normal.  Cranial nerves are grossly intact. PSYCH:  Alert and oriented to person, place and time. Affect is normal.  Labs/Imaging: Labs from 02/27/21: Na 141, K 3.9, Cl 106, CO2 27, BUN 13, Cr 0.83.  WBC 8.5, Hgb 13.6, Hct 41.5, Plt 282.  Troponin <2  CT scan abdomen/pelvis 03/13/21: IMPRESSION: 1. No acute findings in the abdomen or pelvis. 2. Moderate volume of formed stool throughout the colon. 3. Sigmoid colonic diverticulosis without findings of  acute diverticulitis. 4. Nonobstructive left upper pole renal stone.  Assessment and Plan: This is a 45 y.o. female with epigastric pain and diarrhea.  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  Unfortunately she developed constipation with taking three times daily.  I have recommended that she resume this medication but take it only once daily and may titrate depending on her diarrhea.   --Her epigastric pain radiating towards her chest is suggestive of gastric etiology such as GERD or gastritis.  Taking Tums did help so I have recommended that she start Prilosec OTC.  Given the severity of her symptoms, suggested that she start with BID dosing for 1 month and then transition to once daily.   --I'm also sending a referral to [facility] for further evaluation and management of her current symtoms.  The patient seems frustrated with her current GI provider and would like to see someone new. --Patient will follow up with [place] in 1 month.  I spent 25 minutes dedicated to the care of this patient on the date of this encounter to include pre-visit review of records, face-to-face time with the patient discussing  diagnosis and management, and any post-visit coordination of care.   Melvyn Neth, [facility]

## 2021-03-24 NOTE — Patient Instructions (Addendum)
Take over the counter Prilosec 20 mg twice a day for one month them switch to once a day.  Referral sent to [facility]. They will call you to schedule an appointment. Restart the Cholestyramine once a day.

## 2021-04-08 ENCOUNTER — Other Ambulatory Visit: Payer: Self-pay

## 2021-04-08 ENCOUNTER — Ambulatory Visit
Payer: Federal, State, Local not specified - PPO | Attending: Student in an Organized Health Care Education/Training Program | Admitting: Student in an Organized Health Care Education/Training Program

## 2021-04-08 ENCOUNTER — Encounter: Payer: Self-pay | Admitting: Student in an Organized Health Care Education/Training Program

## 2021-04-08 VITALS — BP 118/77 | HR 85 | Temp 97.2°F | Resp 14 | Ht 65.0 in | Wt 160.0 lb

## 2021-04-08 DIAGNOSIS — Z79899 Other long term (current) drug therapy: Secondary | ICD-10-CM | POA: Diagnosis not present

## 2021-04-08 DIAGNOSIS — G4700 Insomnia, unspecified: Secondary | ICD-10-CM | POA: Diagnosis not present

## 2021-04-08 DIAGNOSIS — M5412 Radiculopathy, cervical region: Secondary | ICD-10-CM | POA: Diagnosis not present

## 2021-04-08 DIAGNOSIS — F320 Major depressive disorder, single episode, mild: Secondary | ICD-10-CM | POA: Diagnosis not present

## 2021-04-08 DIAGNOSIS — G8929 Other chronic pain: Secondary | ICD-10-CM | POA: Diagnosis not present

## 2021-04-08 DIAGNOSIS — G894 Chronic pain syndrome: Secondary | ICD-10-CM | POA: Insufficient documentation

## 2021-04-08 DIAGNOSIS — R102 Pelvic and perineal pain: Secondary | ICD-10-CM | POA: Diagnosis not present

## 2021-04-08 DIAGNOSIS — N809 Endometriosis, unspecified: Secondary | ICD-10-CM | POA: Diagnosis not present

## 2021-04-08 MED ORDER — TAPENTADOL HCL 75 MG PO TABS: 75.0000 mg | ORAL_TABLET | Freq: Two times a day (BID) | ORAL | 0 refills | Status: AC | PRN

## 2021-04-08 MED ORDER — TAPENTADOL HCL 75 MG PO TABS: 75.0000 mg | ORAL_TABLET | Freq: Two times a day (BID) | ORAL | 0 refills | Status: DC | PRN

## 2021-04-08 NOTE — Progress Notes (Signed)
Nursing Pain Medication Assessment:  Safety precautions to be maintained throughout the outpatient stay will include: orient to surroundings, keep bed in low position, maintain call bell within reach at all times, provide assistance with transfer out of bed and ambulation.  Medication Inspection Compliance: Pill count conducted under aseptic conditions, in front of the patient. Neither the pills nor the bottle was removed from the patient's sight at any time. Once count was completed pills were immediately returned to the patient in their original bottle.  Medication: Tapentadol (Nucynta) Pill/Patch Count:  15 of 45 pills remain Pill/Patch Appearance: Markings consistent with prescribed medication Bottle Appearance: Standard pharmacy container. Clearly labeled. Filled Date: 72 / 22 / 2022 Last Medication intake:  Yesterday

## 2021-04-08 NOTE — Progress Notes (Signed)
PROVIDER NOTE: Information contained herein reflects review and annotations entered in association with encounter. Interpretation of such information and data should be left to medically-trained personnel. Information provided to patient can be located elsewhere in the medical record under "Patient Instructions". Document created using STT-dictation technology, any transcriptional errors that may result from process are unintentional.    Patient: Stacey Stanley  Service Category: E/M  Provider: [name], MD  DOB: 03/11/1976  DOS: 04/08/2021  Specialty: Interventional Pain Management  MRN: [id number]  Setting: Ambulatory outpatient  PCP: Stacey Sizer, MD  Type: Established Patient    Referring Provider: Steele Sizer, MD  Location: Office  Delivery: Face-to-face     HPI  [name], a 45 y.o. year old female, is here today because of her Chronic pain syndrome [G89.4]. [name] primary complain today is Abdominal Pain Last encounter: My last encounter with her was on 02/10/21 Pertinent problems: [name] has Endometriosis; Chronic pelvic pain in female; Cervical radiculitis; Pelvic pain; Chronic pain syndrome; and Opiate use on their pertinent problem list. Pain Assessment: Severity of Chronic pain is reported as a 6 /10. Location: Abdomen  / . Onset: More than a month ago. Quality: Constant, Sharp. Timing: Constant. Modifying factor(s): heat, medication, rest. Vitals:  height is '5\' 5"'  (1.651 m) and weight is 160 lb (72.6 kg). Her temporal temperature is 97.2 F (36.2 C) (abnormal). Her blood pressure is 118/77 and her pulse is 85. Her respiration is 14 and oxygen saturation is 100%.   Reason for encounter: medication management.    No change in medical history since last visit.  Patient's pain is at baseline.  Patient continues multimodal pain regimen as prescribed.  States that it provides pain relief and improvement in functional status. Patient is status post robotic  cholecystectomy on July 15.  She continues to struggle with intermittent abdominal pain as well as diarrhea.  She has an upcoming appointment with GI next month. Otherwise UDS up-to-date and appropriate, refill of Nucynta as below.  No change in dose.  Pharmacotherapy Assessment   Analgesic: Nucynta 75 mg twice daily as needed, quantity 45/month; MME equals 45   Monitoring: Stacey Stanley PMP: PDMP reviewed during this encounter.       Pharmacotherapy: No side-effects or adverse reactions reported. Compliance: No problems identified. Effectiveness: Clinically acceptable.  Stacey Martins, RN  04/08/2021  1:48 PM  Sign when Signing Visit Nursing Pain Medication Assessment:  Safety precautions to be maintained throughout the outpatient stay will include: orient to surroundings, keep bed in low position, maintain call bell within reach at all times, provide assistance with transfer out of bed and ambulation.  Medication Inspection Compliance: Pill count conducted under aseptic conditions, in front of the patient. Neither the pills nor the bottle was removed from the patient's sight at any time. Once count was completed pills were immediately returned to the patient in their original bottle.  Medication: Tapentadol (Nucynta) Pill/Patch Count:  15 of 45 pills remain Pill/Patch Appearance: Markings consistent with prescribed medication Bottle Appearance: Standard pharmacy container. Clearly labeled. Filled Date: 102 / 22 / 2022 Last Medication intake:  Yesterday     UDS:  Summary  Date Value Ref Range Status  12/31/2020 Note  Final    Comment:    ==================================================================== [employer] Select 13 (MW) ==================================================================== Test                             Result  Flag       Units  Drug Present and Declared for Prescription Verification   Tapentadol                     >3390        EXPECTED   ng/mg creat     Source of tapentadol is a scheduled prescription medication.  ==================================================================== Test                      Result    Flag   Units      Ref Range   Creatinine              295              mg/dL      >=20 ==================================================================== Declared Medications:  The flagging and interpretation on this report are based on the  following declared medications.  Unexpected results may arise from  inaccuracies in the declared medications.   **Note: The testing scope of this panel includes these medications:   Tapentadol (Nucynta)   **Note: The testing scope of this panel does not include the  following reported medications:   Dapsone  Fish Oil  Hydrochlorothiazide (Hydrodiuril)  Ibuprofen (Advil)  Imiquimod (Aldara)  Metformin (Glucophage)  Nicotine  Promethazine (Phenergan)  Semaglutide (Ozempic)  Topical  Vitamin C ==================================================================== For clinical consultation, please call [phone]. ====================================================================      ROS  Constitutional: Denies any fever or chills Gastrointestinal: Intermittent abdominal pain, diarrhea Musculoskeletal: Denies any acute onset joint swelling, redness, loss of ROM, or weakness Neurological: No reported episodes of acute onset apraxia, aphasia, dysarthria, agnosia, amnesia, paralysis, loss of coordination, or loss of consciousness  Medication Review  Dapsone, Fish Oil, Fluocin-Hydroquinone-Tretinoin, hydrochlorothiazide, imiquimod, metFORMIN, nicotine, tapentadol HCl, and vitamin C  History Review  Allergy: [name] is allergic to nuvigil [armodafinil] and ppd [tuberculin purified protein derivative]. Drug: [name]  reports no history of drug use. Alcohol:  reports no history of alcohol use. Tobacco:  reports that she has quit smoking. Her smoking use included  cigarettes. She started smoking about 27 years ago. She has a 12.50 pack-year smoking history. She has never used smokeless tobacco. Social: Stacey Stanley  reports that she has quit smoking. Her smoking use included cigarettes. She started smoking about 27 years ago. She has a 12.50 pack-year smoking history. She has never used smokeless tobacco. She reports that she does not drink alcohol and does not use drugs. Medical:  has a past medical history of Anxiety, Arthritis, Chronic insomnia, Depression, Diabetes mellitus without complication ([place]), Dysrhythmia, Endometriosis, GERD (gastroesophageal reflux disease), Gout, Headache(784.0), History of kidney stones, Hypertension, Metabolic syndrome, Obesity, Right thyroid nodule (2017), Serum calcium elevated, Sleep disorder, circadian, shift work type, SVD (spontaneous vaginal delivery), and Tachycardia. Surgical: Ms. Wiegert  has a past surgical history that includes metatarsil  (2003); Dilation and curettage of uterus; Induced abortion; laparoscopy (1992); Eye surgery (06/28/2013); laparoscopy (N/A, 07/19/2013); Hysteroscopy with D & C (N/A, 07/19/2013); Abdominal hysterectomy (05/21/2016); Hemorroidectomy (04/04/2018); and Colonoscopy (2019). Family: family history includes Alzheimer's disease in her paternal grandfather; Diabetes in her mother; Heart disease in her brother; Hypertension in her father and mother; Kidney disease in her father; Leukemia in her paternal grandmother; Stroke in her maternal grandmother.  Laboratory Chemistry Profile   Renal Lab Results  Component Value Date   BUN 13 02/27/2021   CREATININE 0.83 20/94/7096   BCR NOT APPLICABLE 28/36/6294   GFRAA 108  11/28/2020   GFRNONAA >60 02/27/2021     Hepatic Lab Results  Component Value Date   AST 13 11/28/2020   ALT 11 11/28/2020   ALBUMIN 4.3 11/12/2020   ALKPHOS 46 11/12/2020   AMYLASE 102 (H) 05/22/2016   LIPASE 23 11/28/2020     Electrolytes Lab Results  Component  Value Date   NA 141 02/27/2021   K 3.9 02/27/2021   CL 106 02/27/2021   CALCIUM 10.2 02/27/2021   MG 1.5 09/15/2017     Bone Lab Results  Component Value Date   VD25OH 34 08/01/2018     Inflammation (CRP: Acute Phase) (ESR: Chronic Phase) Lab Results  Component Value Date   CRP 0.2 05/06/2020   ESRSEDRATE 2 05/06/2020       Note: Above Lab results reviewed.  Recent Imaging Review  CT ABDOMEN PELVIS W CONTRAST CLINICAL DATA:  Post cholecystectomy epigastric pain with nausea vomiting and diarrhea since July.  EXAM: CT ABDOMEN AND PELVIS WITH CONTRAST  TECHNIQUE: Multidetector CT imaging of the abdomen and pelvis was performed using the standard protocol following bolus administration of intravenous contrast.  CONTRAST:  65m OMNIPAQUE IOHEXOL 350 MG/ML SOLN  COMPARISON:  CT November 24, 2019  FINDINGS: Lower chest: No acute abnormality.  Hepatobiliary: No suspicious hepatic lesion. Gallbladder surgically absent. No biliary ductal dilation.  Pancreas: Within normal limits.  Spleen: Within normal limits.  Adrenals/Urinary Tract: Bilateral adrenal glands are unremarkable. No hydronephrosis. Nonobstructive 4 mm left upper pole renal stone. Urinary bladder is unremarkable for degree of distension.  Stomach/Bowel: None radiopaque enteric contrast traverses the ascending colon. Stomach is unremarkable for degree of distension. No pathologic dilation of small or large bowel. The appendix and terminal ileum appear normal. Moderate volume of formed stool throughout the colon. Sigmoid colonic diverticulosis without findings of acute diverticulitis.  Vascular/Lymphatic: No abdominal aortic aneurysm. No pathologically enlarged abdominal or pelvic lymph nodes.  Reproductive: Status post hysterectomy. No adnexal masses.  Other: No abdominopelvic ascites. No pneumoperitoneum. Sequela of subcutaneous injections in the posterior gluteal subcutaneous  soft tissues.  Musculoskeletal: No acute or significant osseous findings.  IMPRESSION: 1. No acute findings in the abdomen or pelvis. 2. Moderate volume of formed stool throughout the colon. 3. Sigmoid colonic diverticulosis without findings of acute diverticulitis. 4. Nonobstructive left upper pole renal stone.  Electronically Signed   By: JDahlia BailiffM.D.   On: 03/14/2021 16:37 Note: Reviewed        Physical Exam  General appearance: Well nourished, well developed, and well hydrated. In no apparent acute distress Mental status: Alert, oriented x 3 (person, place, & time)       Respiratory: No evidence of acute respiratory distress Eyes: PERLA Vitals: BP 118/77   Pulse 85   Temp (!) 97.2 F (36.2 C) (Temporal)   Resp 14   Ht '5\' 5"'  (1.651 m)   Wt 160 lb (72.6 kg)   LMP 07/09/2015 (Exact Date)   SpO2 100%   BMI 26.63 kg/m  BMI: Estimated body mass index is 26.63 kg/m as calculated from the following:   Height as of this encounter: '5\' 5"'  (1.651 m).   Weight as of this encounter: 160 lb (72.6 kg). Ideal: Ideal body weight: 57 kg (125 lb 10.6 oz) Adjusted ideal body weight: 63.2 kg (139 lb 6.4 oz)  5 out of 5 strength bilateral lower extremity: Plantar flexion, dorsiflexion, knee flexion, knee extension.   Assessment   Status Diagnosis  Controlled Controlled Controlled 1. Chronic pain syndrome  2. Chronic pelvic pain in female   3. Endometriosis   4. Insomnia, persistent   5. Mild major depression (HCC)   6. Cervical radiculitis   7. Long term prescription benzodiazepine use        Plan of Care   [name] has a current medication list which includes the following long-term medication(s): hydrochlorothiazide and metformin.  Pharmacotherapy (Medications Ordered): Meds ordered this encounter  Medications   tapentadol HCl (NUCYNTA) 75 MG tablet    Sig: Take 1 tablet (75 mg total) by mouth 2 (two) times daily as needed.    Dispense:  45  tablet    Refill:  0    Chronic pain syndrome   tapentadol HCl (NUCYNTA) 75 MG tablet    Sig: Take 1 tablet (75 mg total) by mouth 2 (two) times daily as needed.    Dispense:  45 tablet    Refill:  0    Chronic pain syndrome   tapentadol HCl (NUCYNTA) 75 MG tablet    Sig: Take 1 tablet (75 mg total) by mouth 2 (two) times daily as needed.    Dispense:  45 tablet    Refill:  0    Chronic pain syndrome        Follow-up plan:   Return in about 3 months (around 07/17/2021) for Medication Management, in person.   Recent Visits Date Type Provider Dept  02/10/21 Office Visit Stacey Santa, MD [facility]  Showing recent visits within past 90 days and meeting all other requirements Today's Visits Date Type Provider Dept  04/08/21 Office Visit Stacey Santa, MD [facility]  Showing today's visits and meeting all other requirements Future Appointments No visits were found meeting these conditions. Showing future appointments within next 90 days and meeting all other requirements I discussed the assessment and treatment plan with the patient. The patient was provided an opportunity to ask questions and all were answered. The patient agreed with the plan and demonstrated an understanding of the instructions.  Patient advised to call back or seek an in-person evaluation if the symptoms or condition worsens.  Duration of encounter: 30 minutes.  Note by: Stacey Santa, MD Date: 04/08/2021; Time: 2:02 PM

## 2021-04-21 ENCOUNTER — Ambulatory Visit: Payer: Federal, State, Local not specified - PPO | Admitting: Family Medicine

## 2021-04-22 ENCOUNTER — Telehealth: Payer: Self-pay | Admitting: Student in an Organized Health Care Education/Training Program

## 2021-04-22 DIAGNOSIS — N9412 Deep dyspareunia: Secondary | ICD-10-CM | POA: Diagnosis not present

## 2021-04-22 DIAGNOSIS — N9411 Superficial (introital) dyspareunia: Secondary | ICD-10-CM | POA: Diagnosis not present

## 2021-04-22 DIAGNOSIS — M62838 Other muscle spasm: Secondary | ICD-10-CM | POA: Diagnosis not present

## 2021-04-22 DIAGNOSIS — R159 Full incontinence of feces: Secondary | ICD-10-CM | POA: Diagnosis not present

## 2021-04-23 NOTE — Telephone Encounter (Signed)
Returned patient phone call re; doctors note.  Told her that Dr Holley Raring is out of the office until Monday.  I told her I would be glad to check with him as I do not see anything related to being out of work in his last note.  Patient is agreeable to this and I will contact her back on Monday.   She also states she brought her FMLA papers into be filled out.  I will try and locate those as well.

## 2021-04-28 ENCOUNTER — Ambulatory Visit: Payer: Federal, State, Local not specified - PPO | Admitting: Surgery

## 2021-04-29 ENCOUNTER — Encounter (HOSPITAL_BASED_OUTPATIENT_CLINIC_OR_DEPARTMENT_OTHER): Payer: Self-pay | Admitting: Emergency Medicine

## 2021-04-29 ENCOUNTER — Other Ambulatory Visit: Payer: Self-pay

## 2021-04-29 ENCOUNTER — Emergency Department (HOSPITAL_BASED_OUTPATIENT_CLINIC_OR_DEPARTMENT_OTHER)
Admission: EM | Admit: 2021-04-29 | Discharge: 2021-04-29 | Disposition: A | Discharge status: Home / Self Care | Payer: Federal, State, Local not specified - PPO | Attending: Emergency Medicine | Admitting: Emergency Medicine

## 2021-04-29 ENCOUNTER — Emergency Department (HOSPITAL_BASED_OUTPATIENT_CLINIC_OR_DEPARTMENT_OTHER): Payer: Federal, State, Local not specified - PPO

## 2021-04-29 DIAGNOSIS — N9411 Superficial (introital) dyspareunia: Secondary | ICD-10-CM | POA: Diagnosis not present

## 2021-04-29 DIAGNOSIS — Z87891 Personal history of nicotine dependence: Secondary | ICD-10-CM | POA: Diagnosis not present

## 2021-04-29 DIAGNOSIS — Z7984 Long term (current) use of oral hypoglycemic drugs: Secondary | ICD-10-CM | POA: Insufficient documentation

## 2021-04-29 DIAGNOSIS — R1013 Epigastric pain: Secondary | ICD-10-CM | POA: Diagnosis not present

## 2021-04-29 DIAGNOSIS — Z79899 Other long term (current) drug therapy: Secondary | ICD-10-CM | POA: Diagnosis not present

## 2021-04-29 DIAGNOSIS — K219 Gastro-esophageal reflux disease without esophagitis: Secondary | ICD-10-CM | POA: Diagnosis not present

## 2021-04-29 DIAGNOSIS — K5900 Constipation, unspecified: Secondary | ICD-10-CM

## 2021-04-29 DIAGNOSIS — N200 Calculus of kidney: Secondary | ICD-10-CM | POA: Diagnosis not present

## 2021-04-29 DIAGNOSIS — K529 Noninfective gastroenteritis and colitis, unspecified: Secondary | ICD-10-CM | POA: Insufficient documentation

## 2021-04-29 DIAGNOSIS — I10 Essential (primary) hypertension: Secondary | ICD-10-CM | POA: Diagnosis not present

## 2021-04-29 DIAGNOSIS — E119 Type 2 diabetes mellitus without complications: Secondary | ICD-10-CM | POA: Insufficient documentation

## 2021-04-29 DIAGNOSIS — N9412 Deep dyspareunia: Secondary | ICD-10-CM | POA: Diagnosis not present

## 2021-04-29 DIAGNOSIS — R1033 Periumbilical pain: Secondary | ICD-10-CM | POA: Diagnosis not present

## 2021-04-29 DIAGNOSIS — R159 Full incontinence of feces: Secondary | ICD-10-CM | POA: Diagnosis not present

## 2021-04-29 DIAGNOSIS — M62838 Other muscle spasm: Secondary | ICD-10-CM | POA: Diagnosis not present

## 2021-04-29 LAB — URINALYSIS, ROUTINE W REFLEX MICROSCOPIC
Bilirubin Urine: NEGATIVE
Glucose, UA: NEGATIVE mg/dL
Hgb urine dipstick: NEGATIVE
Ketones, ur: NEGATIVE mg/dL
Leukocytes,Ua: NEGATIVE
Nitrite: NEGATIVE
Protein, ur: NEGATIVE mg/dL
Specific Gravity, Urine: 1.016 (ref 1.005–1.030)
pH: 6 (ref 5.0–8.0)

## 2021-04-29 LAB — CBC WITH DIFFERENTIAL/PLATELET
Abs Immature Granulocytes: 0.01 10*3/uL (ref 0.00–0.07)
Basophils Absolute: 0 10*3/uL (ref 0.0–0.1)
Basophils Relative: 1 %
Eosinophils Absolute: 0.2 10*3/uL (ref 0.0–0.5)
Eosinophils Relative: 2 %
HCT: 41.1 % (ref 36.0–46.0)
Hemoglobin: 13.3 g/dL (ref 12.0–15.0)
Immature Granulocytes: 0 %
Lymphocytes Relative: 57 %
Lymphs Abs: 3.6 10*3/uL (ref 0.7–4.0)
MCH: 28.4 pg (ref 26.0–34.0)
MCHC: 32.4 g/dL (ref 30.0–36.0)
MCV: 87.8 fL (ref 80.0–100.0)
Monocytes Absolute: 0.3 10*3/uL (ref 0.1–1.0)
Monocytes Relative: 4 %
Neutro Abs: 2.3 10*3/uL (ref 1.7–7.7)
Neutrophils Relative %: 36 %
Platelets: 258 10*3/uL (ref 150–400)
RBC: 4.68 MIL/uL (ref 3.87–5.11)
RDW: 12.9 % (ref 11.5–15.5)
WBC: 6.4 10*3/uL (ref 4.0–10.5)
nRBC: 0 % (ref 0.0–0.2)

## 2021-04-29 LAB — COMPREHENSIVE METABOLIC PANEL
ALT: 9 U/L (ref 0–44)
AST: 13 U/L — ABNORMAL LOW (ref 15–41)
Albumin: 4.4 g/dL (ref 3.5–5.0)
Alkaline Phosphatase: 41 U/L (ref 38–126)
Anion gap: 7 (ref 5–15)
BUN: 12 mg/dL (ref 6–20)
CO2: 27 mmol/L (ref 22–32)
Calcium: 10.5 mg/dL — ABNORMAL HIGH (ref 8.9–10.3)
Chloride: 105 mmol/L (ref 98–111)
Creatinine, Ser: 0.65 mg/dL (ref 0.44–1.00)
GFR, Estimated: >60 mL/min (ref >60)
Glucose, Bld: 84 mg/dL (ref 70–99)
Potassium: 3.5 mmol/L (ref 3.5–5.1)
Sodium: 139 mmol/L (ref 135–145)
Total Bilirubin: 1 mg/dL (ref 0.3–1.2)
Total Protein: 6.4 g/dL — ABNORMAL LOW (ref 6.5–8.1)

## 2021-04-29 LAB — LIPASE, BLOOD: Lipase: 28 U/L (ref 11–51)

## 2021-04-29 MED ORDER — IOHEXOL 300 MG/ML  SOLN
100.0000 mL | Freq: Once | INTRAMUSCULAR | Status: AC | PRN
  Administered 2021-04-29: 100 mL via INTRAVENOUS

## 2021-04-29 NOTE — ED Notes (Signed)
Pt transported to CT ?

## 2021-04-29 NOTE — Discharge Instructions (Addendum)
CT scan labs without significant abnormalities.  Some evidence of constipation and some inflammation in the large intestines.  But he do not have a fever your white blood cell counts not elevated so I do not think this is infectious in nature.  Would increase your dosing of your MiraLAX.  There was a incidental finding of the spleen.  We will have you follow-up with your primary care doctor just to follow that in the future.  They may want to consider to do an MRI of that.  No evidence of abdominal aortic aneurysm.

## 2021-04-29 NOTE — ED Provider Notes (Signed)
[facility] DEPT Provider Note   CSN: [id number] Arrival date & time: 04/29/21  1747     History Chief Complaint  Patient presents with   Abdominal Pain    Stacey Stanley is a 45 y.o. female.  Patient sent in by physical therapy.  They are concerned about a pulsatile mass in the abdomen.  Apparently they measured it last week and now they feel it is bigger.  The patient had CT scan abdomen and pelvis done October 14 which just showed diverticulosis.  Showed no aneurysm.  So is highly unlikely that that is what the concern is.  But patient has been having some epigastric and some lower abdomen abdominal pain.  More mid abdomen.  Associated with some nausea but no vomiting no diarrhea.  Pain does moved to the back area.  Blood pressures here are very normal.  105/73 heart rate 73 no fever.  Oxygen saturations 100% on room air.      Past Medical History:  Diagnosis Date   Anxiety    no current meds   Arthritis    left knee pain - otc med prn   Chronic insomnia    Depression    Diabetes mellitus without complication ([place])    Dysrhythmia    Home with daughter [name]   Endometriosis    GERD (gastroesophageal reflux disease)    diet control - no meds   Gout    wrist, feet, hands bilateral   Headache(784.0)    hx - last one 2 yrs ago - no meds   History of kidney stones    Hypertension    Metabolic syndrome    Obesity    Right thyroid nodule 2017   benign   Serum calcium elevated    Sleep disorder, circadian, shift work type    SVD (spontaneous vaginal delivery)    x 2   Tachycardia    History - neg cardiac tests-stress related    Patient Active Problem List   Diagnosis Date Noted   Symptomatic cholelithiasis    Constipation 11/11/2020   Vomiting without nausea 11/11/2020   Gastroesophageal reflux disease 11/11/2020   Chronic pain syndrome 12/28/2017   Opiate use 12/28/2017   Long term prescription benzodiazepine use 12/28/2017    Thrombocytosis 10/26/2017   Chronic idiopathic constipation 10/13/2017   Pelvic pain 09/09/2017   Mass of left ovary 06/28/2017   Kidney stone on left side 06/28/2017   Cervical radiculitis 03/08/2017   Vitamin B12 deficiency 08/19/2016   Prediabetes 08/19/2016   Right thyroid nodule 07/31/2015   Chronic pelvic pain in female 04/05/2015   GERD (gastroesophageal reflux disease) 04/05/2015   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 Levin Bacon    HYSTEROSCOPY WITH D & C N/A 07/19/2013   Procedure: DILATATION AND CURETTAGE Pollyann Glen;  Surgeon: Marvene Staff, MD;  Location: [facility];  Service: Gynecology;  Laterality: N/A;   INDUCED ABORTION     LAPAROSCOPY  1992   endometriosis   LAPAROSCOPY N/A 07/19/2013   Procedure: LAPAROSCOPY DIAGNOSTIC  with resection of endometriosis;  Surgeon:  Marvene Staff, MD;  Location: [facility];  Service: Gynecology;  Laterality: N/A;   metatarsil   2003   left foot surgery      OB History     Gravida  3   Para  2   Term  2   Preterm  0   AB  1   Living  2      SAB  1   IAB  0   Ectopic  0   Multiple  0   Live Births              Family History  Problem Relation Age of Onset   Diabetes Mother    Hypertension Mother    Hypertension Father    Kidney disease Father    Stroke Maternal Grandmother    Leukemia Paternal Grandmother    Alzheimer's disease Paternal Grandfather    Heart disease Brother    Anesthesia problems Neg Hx    Hypotension Neg Hx    Malignant hyperthermia Neg Hx    Pseudochol deficiency Neg Hx      Social History   Tobacco Use   Smoking status: Former    Packs/day: 0.50    Years: 25.00    Pack years: 12.50    Types: Cigarettes    Start date: 11/25/1993   Smokeless tobacco: Never  Vaping Use   Vaping Use: Former   Start date: 06/01/2014   Quit date: 06/02/2015  Substance Use Topics   Alcohol use: No    Alcohol/week: 0.0 standard drinks   Drug use: No    Home Medications Prior to Admission medications   Medication Sig Start Date End Date Taking? Authorizing Provider  Dapsone 5 % topical gel Apply 60 g topically daily. 09/21/19   [provider]  hydrochlorothiazide (HYDRODIURIL) 12.5 MG tablet Take 1 tablet (12.5 mg total) by mouth daily. 11/11/20   Steele Sizer, MD  imiquimod [place]) 5 % cream Apply 1 packet topically daily as needed (warts). 09/21/19   [provider]  metFORMIN (GLUCOPHAGE-XR) 750 MG 24 hr tablet Take 1 tablet (750 mg total) by mouth daily with breakfast. 11/11/20   Steele Sizer, MD  nicotine (NICODERM CQ - DOSED IN MG/24 HOURS) 21 mg/24hr patch Place 21 mg onto the skin daily as needed (nicotine).    [provider]  Omega-3 Fatty Acids (FISH OIL) 1000 MG CAPS Take 2,000 mg by mouth daily.    [provider]  tapentadol HCl (NUCYNTA) 75 MG tablet Take 1 tablet (75 mg total) by mouth 2 (two) times daily as needed. 04/23/21 05/23/21  [name], MD  tapentadol HCl (NUCYNTA) 75 MG tablet Take 1 tablet (75 mg total) by mouth 2 (two) times daily as needed. 05/23/21 06/22/21  [name], MD  tapentadol HCl (NUCYNTA) 75 MG tablet Take 1 tablet (75 mg total) by mouth 2 (two) times daily as needed. 06/22/21 07/22/21  [name], MD  TRI-LUMA 0.01-4-0.05 % CREA Apply 30 mg topically daily. 09/21/19   [provider]  vitamin C (ASCORBIC ACID) 500 MG tablet Take 500 mg by mouth daily.    [provider]    Allergies    Nuvigil [armodafinil] and Ppd [tuberculin purified protein derivative]  Review of  Systems   Review of Systems  Constitutional:  Negative for chills and fever.  HENT:  Negative for ear pain and sore throat.   Eyes:  Negative for pain and visual disturbance.  Respiratory:  Negative for cough and  shortness of breath.   Cardiovascular:  Negative for chest pain and palpitations.  Gastrointestinal:  Positive for abdominal pain and nausea. Negative for diarrhea and vomiting.  Genitourinary:  Negative for dysuria and hematuria.  Musculoskeletal:  Positive for back pain. Negative for arthralgias.  Skin:  Negative for color change and rash.  Neurological:  Negative for seizures and syncope.  All other systems reviewed and are negative.  Physical Exam Updated Vital Signs BP (!) 123/97   Pulse 73   Temp 98.4 F (36.9 C)   Resp 18   Ht 1.651 m (5\' 5" )   Wt 74.8 kg   LMP 07/09/2015 (Exact Date)   SpO2 100%   BMI 27.46 kg/m   Physical Exam Vitals and nursing note reviewed.  Constitutional:      General: She is not in acute distress.    Appearance: Normal appearance. She is well-developed.  HENT:     Head: Normocephalic and atraumatic.  Eyes:     Extraocular Movements: Extraocular movements intact.     Conjunctiva/sclera: Conjunctivae normal.     Pupils: Pupils are equal, round, and reactive to light.  Cardiovascular:     Rate and Rhythm: Normal rate and regular rhythm.     Heart sounds: No murmur heard. Pulmonary:     Effort: Pulmonary effort is normal. No respiratory distress.     Breath sounds: Normal breath sounds.  Abdominal:     General: There is no distension.     Palpations: Abdomen is soft.     Tenderness: There is no abdominal tenderness. There is no guarding.  Musculoskeletal:        General: No swelling.     Cervical back: Normal range of motion and neck supple.  Skin:    General: Skin is warm and dry.     Capillary Refill: Capillary refill takes less than 2 seconds.  Neurological:     General: No focal deficit present.     Mental Status: She  is alert and oriented to person, place, and time.  Psychiatric:        Mood and Affect: Mood normal.    ED Results / Procedures / Treatments   Labs (all labs ordered are listed, but only abnormal results are displayed) Labs Reviewed  COMPREHENSIVE METABOLIC PANEL - Abnormal; Notable for the following components:      Result Value   Calcium 10.5 (*)    Total Protein 6.4 (*)    AST 13 (*)    All other components within normal limits  CBC WITH DIFFERENTIAL/PLATELET  LIPASE, BLOOD  URINALYSIS, ROUTINE W REFLEX MICROSCOPIC    EKG EKG Interpretation  Date/Time:  Tuesday April 29 2021 18:15:19 EST Ventricular Rate:  84 PR Interval:  168 QRS Duration: 72 QT Interval:  350 QTC Calculation: 413 R Axis:   70 Text Interpretation: Normal sinus rhythm with sinus arrhythmia Normal ECG Confirmed by Fredia Sorrow [phone]) on 04/29/2021 9:07:40 PM  Radiology CT Abdomen Pelvis W Contrast  Result Date: 04/29/2021 CLINICAL DATA:  Clinically suspected diverticulitis. EXAM: CT ABDOMEN AND PELVIS WITH CONTRAST TECHNIQUE: Multidetector CT imaging of the abdomen and pelvis was performed using the standard protocol following bolus administration of intravenous contrast. CONTRAST:  148mL OMNIPAQUE IOHEXOL 300 MG/ML  SOLN COMPARISON:  CT with IV contrast 03/13/2021, CT without contrast 11/24/2019. MRI abdomen 03/21/2015. FINDINGS: Lower chest: No acute abnormality. Hepatobiliary: 20 cm in length liver with no mass enhancement or biliary ductal dilatation. The gallbladder is surgically absent. Pancreas: No mass or  ductal dilatation. Spleen: Solitary 1.3 cm low-density lesion in the medial spleen is unchanged since last month's study but not seen in 2021. Further evaluation is recommended. Adrenals/Urinary Tract: There is no adrenal mass. A 4 mm nonobstructing caliceal stone is again noted in the superior pole of the left kidney. There is no obstructing or further stones or hydroureteronephrosis. There are  scattered tiny cortical cysts. No mass enhancement. The bladder is incompletely distended but could be thickened. Stomach/Bowel: There is moderate fold thickening in the stomach, mild jejunal fold thickening and mucosal enhancement in normal caliber fluid-filled lower abdominal small bowel, consistent with gastroenteritis. The appendix is normal in caliber. Fecal back up is noted into the terminal ileum but no upstream bowel dilatation. There are scattered diverticula. Wall thickening or underdistention is seen in the distal transverse and descending colon and some images may suggest faint inflammatory stranding over portions. The sigmoid colon demonstrates diverticula without wall thickening. Vascular/Lymphatic: No significant vascular findings are present. No enlarged abdominal or pelvic lymph nodes. Reproductive: Uterus is absent. No adnexal mass is seen. No ventral or inguinal hernia is seen. Other: There is a small amount of pelvic cul-de-sac low-density fluid, could be physiologic or reactive. There is no free air, abscess or free hemorrhage. Musculoskeletal: No acute or significant osseous findings. IMPRESSION: 1. CT findings of gastroenteritis, and constipation with backup into the terminal ileum but no small bowel obstruction or mesenteric inflammatory change. 2. Transverse and descending colitis versus nondistention, with scattered diverticula. 3. Cystitis versus bladder nondistention. 4. Minimal pelvic cul-de-sac fluid. 5. 1.3 cm splenic hypodensity not seen on prior studies. Further evaluation recommended preferably with MRI without and with contrast. 6. Nonobstructive 4 mm stone in the left upper renal pole. Electronically Signed   By: Telford Nab M.D.   On: 04/29/2021 22:53    Procedures Procedures   Medications Ordered in ED Medications  iohexol (OMNIPAQUE) 300 MG/ML solution 100 mL (100 mLs Intravenous Contrast Given 04/29/21 2223)    ED Course  I have reviewed the triage vital signs  and the nursing notes.  Pertinent labs & imaging results that were available during my care of the patient were reviewed by me and considered in my medical decision making (see chart for details).    MDM Rules/Calculators/A&P                           Patient is abdomen soft and nontender.  But she does have subjective pain in the abdomen.  Highly unlikely that she would have an abdominal aortic aneurysm since CT scan October 14 of this year only showed diverticulosis.  But because of the abdominal complaint we will go ahead and get CT abdomen get labs.  Always could have a bout of diverticulitis.  CT scan labs without any significant abnormalities.  CT scan did show some evidence of may be some gastroenteritis or colitis.  Patient has no leukocytosis.  So I think this just may be some inflammation there certainly was some evidence of constipation.  Patient states she takes MiraLAX already.  We will have her increase that throughout the day follow back up with her primary care doctor.  There was some question of hypodensity in the spleen which can get followed up.  Definitely no evidence for an abdominal aortic aneurysm.   Final Clinical Impression(s) / ED Diagnoses Final diagnoses:  Periumbilical abdominal pain  Constipation, unspecified constipation type  Colitis    Rx / DC  Orders ED Discharge Orders     None        Fredia Sorrow, MD 04/29/21 2336

## 2021-04-29 NOTE — ED Triage Notes (Signed)
Pt arrives pov with c/o abdominal pain radiating to LUQ and back., reports sent from PT with concern for AAA, reports pulsating mass in mid abdomen. Pt endorses nausea.
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
## 2021-05-09 ENCOUNTER — Ambulatory Visit: Payer: Federal, State, Local not specified - PPO | Admitting: Family Medicine

## 2021-05-09 ENCOUNTER — Other Ambulatory Visit: Payer: Self-pay

## 2021-05-09 ENCOUNTER — Encounter: Payer: Self-pay | Admitting: Family Medicine

## 2021-05-09 VITALS — BP 112/72 | HR 97 | Temp 97.9°F | Resp 16 | Ht 65.0 in | Wt 165.3 lb

## 2021-05-09 DIAGNOSIS — F411 Generalized anxiety disorder: Secondary | ICD-10-CM

## 2021-05-09 DIAGNOSIS — E8881 Metabolic syndrome: Secondary | ICD-10-CM

## 2021-05-09 DIAGNOSIS — R198 Other specified symptoms and signs involving the digestive system and abdomen: Secondary | ICD-10-CM

## 2021-05-09 DIAGNOSIS — R1084 Generalized abdominal pain: Secondary | ICD-10-CM

## 2021-05-09 DIAGNOSIS — I10 Essential (primary) hypertension: Secondary | ICD-10-CM | POA: Diagnosis not present

## 2021-05-09 DIAGNOSIS — R935 Abnormal findings on diagnostic imaging of other abdominal regions, including retroperitoneum: Secondary | ICD-10-CM

## 2021-05-09 DIAGNOSIS — F339 Major depressive disorder, recurrent, unspecified: Secondary | ICD-10-CM | POA: Diagnosis not present

## 2021-05-09 DIAGNOSIS — R1900 Intra-abdominal and pelvic swelling, mass and lump, unspecified site: Secondary | ICD-10-CM

## 2021-05-09 MED ORDER — METFORMIN HCL ER 750 MG PO TB24: 750.0000 mg | ORAL_TABLET | Freq: Every day | ORAL | 1 refills | Status: DC

## 2021-05-09 MED ORDER — HYDROCHLOROTHIAZIDE 12.5 MG PO TABS: 12.5000 mg | ORAL_TABLET | Freq: Every day | ORAL | 1 refills | Status: DC

## 2021-05-20 ENCOUNTER — Other Ambulatory Visit: Payer: Self-pay

## 2021-05-20 ENCOUNTER — Ambulatory Visit (HOSPITAL_COMMUNITY)
Admission: RE | Admit: 2021-05-20 | Discharge: 2021-05-20 | Disposition: A | Discharge status: Home / Self Care | Payer: Federal, State, Local not specified - PPO | Source: Ambulatory Visit | Attending: Family Medicine | Admitting: Family Medicine

## 2021-05-20 DIAGNOSIS — R1900 Intra-abdominal and pelvic swelling, mass and lump, unspecified site: Secondary | ICD-10-CM | POA: Insufficient documentation

## 2021-05-20 DIAGNOSIS — D7389 Other diseases of spleen: Secondary | ICD-10-CM | POA: Diagnosis not present

## 2021-05-20 MED ORDER — GADOBUTROL 1 MMOL/ML IV SOLN
7.5000 mL | Freq: Once | INTRAVENOUS | Status: AC | PRN
  Administered 2021-05-20: 17:00:00 7.5 mL via INTRAVENOUS

## 2021-05-22 ENCOUNTER — Telehealth: Payer: Self-pay | Admitting: Family Medicine

## 2021-05-22 DIAGNOSIS — N809 Endometriosis, unspecified: Secondary | ICD-10-CM | POA: Diagnosis not present

## 2021-05-22 DIAGNOSIS — R194 Change in bowel habit: Secondary | ICD-10-CM | POA: Diagnosis not present

## 2021-05-22 DIAGNOSIS — R109 Unspecified abdominal pain: Secondary | ICD-10-CM | POA: Diagnosis not present

## 2021-05-22 NOTE — Telephone Encounter (Signed)
Called pt to inform she has to either bring them in person or have them faxed over to our office. No emails.

## 2021-05-22 NOTE — Telephone Encounter (Signed)
Patient called in asking if she can email medical forms to be filled out. She doesn't have access to fax

## 2021-05-27 ENCOUNTER — Encounter: Payer: Self-pay | Admitting: Family Medicine

## 2021-05-28 ENCOUNTER — Ambulatory Visit: Payer: Federal, State, Local not specified - PPO | Admitting: Gastroenterology

## 2021-06-03 ENCOUNTER — Other Ambulatory Visit: Payer: Self-pay

## 2021-06-03 ENCOUNTER — Encounter: Payer: Self-pay | Admitting: Gastroenterology

## 2021-06-03 ENCOUNTER — Ambulatory Visit (INDEPENDENT_AMBULATORY_CARE_PROVIDER_SITE_OTHER): Payer: Federal, State, Local not specified - PPO | Admitting: Gastroenterology

## 2021-06-03 VITALS — BP 123/87 | HR 91 | Temp 98.6°F | Wt 162.4 lb

## 2021-06-03 DIAGNOSIS — K529 Noninfective gastroenteritis and colitis, unspecified: Secondary | ICD-10-CM | POA: Diagnosis not present

## 2021-06-03 DIAGNOSIS — R1013 Epigastric pain: Secondary | ICD-10-CM | POA: Diagnosis not present

## 2021-06-03 DIAGNOSIS — G8929 Other chronic pain: Secondary | ICD-10-CM

## 2021-06-03 MED ORDER — GOLYTELY 236 G PO SOLR: 4000.0000 mL | Freq: Once | ORAL | 0 refills | Status: AC

## 2021-06-03 MED ORDER — AMITRIPTYLINE HCL 10 MG PO TABS: 20.0000 mg | ORAL_TABLET | Freq: Every day | ORAL | 0 refills | Status: DC

## 2021-06-03 MED ORDER — GOLYTELY 236 G PO SOLR: 4000.0000 mL | Freq: Once | ORAL | 0 refills | Status: DC

## 2021-06-03 NOTE — Progress Notes (Signed)
Cephas Darby, MD [address]  [place]  [place], [place] [address]  Main: [phone]  Fax: [phone]    Gastroenterology Consultation  Referring Provider:     Olean Ree, MD Primary Care Physician:  Steele Sizer, MD Primary Gastroenterologist:  Dr. Cephas Darby Reason for Consultation:     Chronic diarrhea, abdominal discomfort        HPI:   Stacey Stanley is a 46 y.o. female referred by Dr. Steele Sizer, MD  for consultation & management of chronic diarrhea.  Patient reports that since 07/2020, patient has been experiencing several bouts of watery diarrhea as well as epigastric/right upper quadrant pain associated with nausea.  Patient has been diagnosed with symptomatic cholelithiasis, underwent laparoscopic cholecystectomy by Dr. Celine Ahr on 12/13/2020.  Patient reports that her upper abdominal discomfort has not completely resolved since cholecystectomy.  She has been suffering from alternating diarrhea and constipation.  Diarrhea has been her main concern which has worsened since cholecystectomy.  Patient was started on cholestyramine 3 times a day which led to severe constipation.  She also has issues with rectal sphincter since she had hemorrhoidectomy last year.  Patient reports that she has significant amount of stress in her professional life.  She is a Marine scientist at the [employer].  She states that her sleep is not adequate.  Patient underwent CT abdomen and pelvis with no acute intra-abdominal pathology.  Patient does have history of chronic pelvic pain from endometriosis, had history of laparoscopic hysterectomy and laparoscopic resection of endometriosis.  Patient's weight has been stable.  Patient tried Linzess and Trulance for constipation which did not help.  Her labs including CBC, CMP, TSH are unremarkable. Patient does not smoke or drink alcohol Patient is accompanied by her husband today  NSAIDs: None  Antiplts/Anticoagulants/Anti thrombotics:  None  GI Procedures:  Colonoscopy in 03/02/2018 with poor prep  Past Medical History:  Diagnosis Date   Anxiety    no current meds   Arthritis    left knee pain - otc med prn   Chronic insomnia    Depression    Diabetes mellitus without complication ([facility])    Dysrhythmia    Home with daughter Stacey Stanley   Endometriosis    GERD (gastroesophageal reflux disease)    diet control - no meds   Gout    wrist, feet, hands bilateral   Headache(784.0)    hx - last one 2 yrs ago - no meds   History of kidney stones    Hypertension    Metabolic syndrome    Obesity    Right thyroid nodule 2017   benign   Serum calcium elevated    Sleep disorder, circadian, shift work type    SVD (spontaneous vaginal delivery)    x 2   Tachycardia    History - neg cardiac tests-stress related    Past Surgical History:  Procedure Laterality Date   ABDOMINAL HYSTERECTOMY  05/21/2016   COLONOSCOPY  2019   DILATION AND CURETTAGE OF UTERUS     EYE SURGERY  06/28/2013   right laser eye surgery - repair retina   HEMORROIDECTOMY  04/04/2018   Dr. Levin Bacon    HYSTEROSCOPY WITH D & C N/A 07/19/2013   Procedure: DILATATION AND CURETTAGE Pollyann Glen;  Surgeon: Marvene Staff, MD;  Location: [facility];  Service: Gynecology;  Laterality: N/A;   INDUCED ABORTION     LAPAROSCOPY  1992   endometriosis   LAPAROSCOPY N/A 07/19/2013  Procedure: LAPAROSCOPY DIAGNOSTIC  with resection of endometriosis;  Surgeon: Marvene Staff, MD;  Location: [facility];  Service: Gynecology;  Laterality: N/A;   metatarsil   2003   left foot surgery    Current Outpatient Medications:    amitriptyline (ELAVIL) 10 MG tablet, Take 2 tablets (20 mg total) by mouth at bedtime., Disp: 60 tablet, Rfl: 0   cholestyramine (QUESTRAN) 4 g packet, as directed orally 2 times a day for 30 day(s), Disp: , Rfl:    Dapsone 5 % topical gel, Apply 60 g topically daily., Disp: , Rfl:    hydrochlorothiazide (HYDRODIURIL) 12.5 MG tablet,  Take 1 tablet (12.5 mg total) by mouth daily., Disp: 90 tablet, Rfl: 1   imiquimod (ALDARA) 5 % cream, Apply 1 packet topically daily as needed (warts)., Disp: , Rfl:    metFORMIN (GLUCOPHAGE-XR) 750 MG 24 hr tablet, Take 1 tablet (750 mg total) by mouth daily with breakfast., Disp: 90 tablet, Rfl: 1   nicotine (NICODERM CQ - DOSED IN MG/24 HOURS) 21 mg/24hr patch, Place 21 mg onto the skin daily as needed (nicotine)., Disp: , Rfl:    Omega-3 Fatty Acids (FISH OIL) 1000 MG CAPS, Take 2,000 mg by mouth daily., Disp: , Rfl:    ondansetron (ZOFRAN) 4 MG tablet, 1 tab(s) orally 3 times a day for 30 days, Disp: , Rfl:    Plecanatide (TRULANCE) 3 MG TABS, 1 tab(s) orally once a day for 90 days, Disp: , Rfl:    tapentadol HCl (NUCYNTA) 75 MG tablet, Take 1 tablet (75 mg total) by mouth 2 (two) times daily as needed., Disp: 45 tablet, Rfl: 0   [START ON 06/22/2021] tapentadol HCl (NUCYNTA) 75 MG tablet, Take 1 tablet (75 mg total) by mouth 2 (two) times daily as needed., Disp: 45 tablet, Rfl: 0   TRI-LUMA 0.01-4-0.05 % CREA, Apply 30 mg topically daily., Disp: , Rfl:    vitamin C (ASCORBIC ACID) 500 MG tablet, Take 500 mg by mouth daily., Disp: , Rfl:     Family History  Problem Relation Age of Onset   Diabetes Mother    Hypertension Mother    Hypertension Father    Kidney disease Father    Stroke Maternal Grandmother    Leukemia Paternal Grandmother    Alzheimer's disease Paternal Grandfather    Heart disease Brother    Anesthesia problems Neg Hx    Hypotension Neg Hx    Malignant hyperthermia Neg Hx    Pseudochol deficiency Neg Hx      Social History   Tobacco Use   Smoking status: Former    Packs/day: 0.50    Years: 25.00    Pack years: 12.50    Types: Cigarettes    Start date: 11/25/1993   Smokeless tobacco: Never  Vaping Use   Vaping Use: Former   Start date: 06/01/2014   Quit date: 06/02/2015  Substance Use Topics   Alcohol use: No    Alcohol/week: 0.0 standard drinks   Drug  use: No    Allergies as of 06/03/2021 - Review Complete 06/03/2021  Allergen Reaction Noted   Nuvigil [armodafinil]  07/27/2017   Ppd [tuberculin purified protein derivative]  12/13/2020    Review of Systems:    All systems reviewed and negative except where noted in HPI.   Physical Exam:  BP 123/87 (BP Location: Left Arm, Patient Position: Sitting, Cuff Size: Normal)    Pulse 91    Temp 98.6 F (37 C) (Oral)  Wt 162 lb 6 oz (73.7 kg)    LMP 07/09/2015 (Exact Date)    BMI 27.02 kg/m  Patient's last menstrual period was 07/09/2015 (exact date).  General:   Alert,  Well-developed, well-nourished, pleasant and cooperative in NAD Head:  Normocephalic and atraumatic. Eyes:  Sclera clear, no icterus.   Conjunctiva pink. Ears:  Normal auditory acuity. Nose:  No deformity, discharge, or lesions. Mouth:  No deformity or lesions,oropharynx pink & moist. Neck:  Supple; no masses or thyromegaly. Lungs:  Respirations even and unlabored.  Clear throughout to auscultation.   No wheezes, crackles, or rhonchi. No acute distress. Heart:  Regular rate and rhythm; no murmurs, clicks, rubs, or gallops. Abdomen:  Normal bowel sounds. Soft, mild, diffuse abdominal tenderness, mildly distended, tympanic to percussion, without masses, hepatosplenomegaly or hernias noted.  No guarding or rebound tenderness.   Rectal: Not performed Msk:  Symmetrical without gross deformities. Good, equal movement & strength bilaterally. Pulses:  Normal pulses noted. Extremities:  No clubbing or edema.  No cyanosis. Neurologic:  Alert and oriented x3;  grossly normal neurologically. Skin:  Intact without significant lesions or rashes. No jaundice. Lymph Nodes:  No significant cervical adenopathy. Psych:  Alert and cooperative. Normal mood and affect.  Imaging Studies: Reviewed  Assessment and Plan:   GINAMARIE BANFIELD is a 46 y.o. 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

## 2021-06-03 NOTE — Patient Instructions (Signed)
Lactose-Free Diet, Adult If you have lactose intolerance, you are not able to digest lactose. Lactose is a natural sugar found mainly in dairy milk and dairy products. A lactose-freediet can help you avoid foods and beverages that contain lactose. What are tips for following this plan? Reading food labels Do not consume foods, beverages, vitamins, minerals, or medicines containing lactose. Read ingredient lists carefully. Look for the words "lactose-free" on labels. Meal planning Use alternatives to dairy milk and foods made with milk products. These include the following: Lactose-free milk. Soy milk with added calcium and vitamin D. Almond milk, coconut milk, rice milk, or other nondairy milk alternatives with added calcium and vitamin D. Note that a lot of these are low in protein. Soy products, such as soy yogurt, soy cheese, soy ice cream, and soy-based sour cream. Other nut milk products, such as almond yogurt, almond cheese, cashew yogurt, cashew cheese, cashew ice cream, coconut yogurt, and coconut ice cream. Medicines, vitamins, and supplements Use lactase enzyme drops or tablets as directed by your health care provider. Make sure you get enough calcium and vitamin D in your diet. A lactose-free eating plan can be lacking in these important nutrients. Take calcium and vitamin D supplements as directed by your health care provider. Talk with your health care provider about supplements if you are not able to get enough calcium and vitamin D from food. What foods should I eat?  Fruits All fresh, canned, frozen, or dried fruits and fruit juices that are notprocessed with lactose. Vegetables All fresh, frozen, and canned vegetables without cheese, cream, or buttersauces. Grains Any that are not made with dairy milk or dairy products. Meats and other proteins Any meat, fish, poultry, and other protein sources that are not made with dairymilk or dairy products. Fats and oils Any that are  not made with dairy milk or dairy products. Sweets and desserts Any that are not made with dairy milk or dairy products. Seasonings and condiments Any that are not made with dairy milk or dairy products. Calcium Calcium is found in many foods that contain lactose and is important for bone health. The amount of calcium you need depends on your age: Adults younger than 50 years: 1,000 mg of calcium a day. Adults older than 50 years: 1,200 mg of calcium a day. If you are not getting enough calcium, you may get it from other sources, including: Orange juice that has been fortified with calcium. This means that calcium has been added to the product. There are 300-350 mg of calcium in 1 cup (237 mL) of calcium-fortified orange juice. Soy milk fortified with calcium. There are 300-400 mg of calcium in 1 cup (237 mL) of calcium-fortified soy milk. Rice or almond milk fortified with calcium. There are 300 mg of calcium in 1 cup (237 mL) of calcium-fortified rice or almond milk. Breakfast cereals fortified with calcium. There are 100-1,000 mg of calcium in calcium-fortified breakfast cereals. Spinach, cooked. There are 145 mg of calcium in  cup (90 g) of cooked spinach. Edamame, cooked. There are 130 mg of calcium in  cup (47 g) of cooked edamame. Collard greens, cooked. There are 125 mg of calcium in  cup (85 g) of cooked collard greens. Kale, frozen or cooked. There are 90 mg of calcium in  cup (59 g) of cooked or frozen kale. Almonds. There are 95 mg of calcium in  cup (35 g) of almonds. Broccoli, cooked. There are 60 mg of calcium in 1 cup (156   g) of cooked broccoli. The items listed above may not be a complete list of foods and beverages you can eat. Contact a dietitian for more options. What foods should I avoid? Lactose is found in dairy milk and dairy products, such as: Yogurt. Cheese. Butter. Margarine. Sour cream. Cream. Whipped toppings and creamers. Ice cream and other  dairy-based desserts. Lactose is also found in foods or products made with dairy milk or milk ingredients. To find out whether a food contains dairy milk or a milk ingredient, look at the ingredients list. Avoid foods with the statement "May contain milk" and foods that contain: Milk powder. Whey. Curd. Lactose. Lactoglobulin. The items listed above may not be a complete list of foods and beverages to avoid. Contact a dietitian for more information. Where to find more information National Institute of Diabetes and Digestive and Kidney Diseases: www.niddk.nih.gov Summary If you are lactose intolerant, it means that you are not able to digest lactose, a natural sugar found in milk and milk products. Following a lactose-free diet can help you manage this condition. Calcium is important for bone health and is found in many foods that contain lactose. Talk with your health care provider about other sources of calcium. This information is not intended to replace advice given to you by your health care provider. Make sure you discuss any questions you have with your healthcare provider. Document Revised: 04/23/2020 Document Reviewed: 04/23/2020 Elsevier Patient Education  2022 Elsevier Inc.  

## 2021-06-06 DIAGNOSIS — K529 Noninfective gastroenteritis and colitis, unspecified: Secondary | ICD-10-CM | POA: Diagnosis not present

## 2021-06-10 ENCOUNTER — Telehealth: Payer: Self-pay

## 2021-06-10 LAB — GI PROFILE, STOOL, PCR

## 2021-06-10 NOTE — Telephone Encounter (Signed)
-----   Message from Lin Landsman, MD sent at 06/10/2021  8:26 AM EST ----- Please inform pt that her stool studies came back negative for infection. I will see her for egd and colonoscopy as scheduled  RV

## 2021-06-10 NOTE — Telephone Encounter (Signed)
Called and left a message for call back. Sent mychart message with results

## 2021-06-16 DIAGNOSIS — M62838 Other muscle spasm: Secondary | ICD-10-CM | POA: Diagnosis not present

## 2021-06-16 DIAGNOSIS — N9411 Superficial (introital) dyspareunia: Secondary | ICD-10-CM | POA: Diagnosis not present

## 2021-06-16 DIAGNOSIS — N9412 Deep dyspareunia: Secondary | ICD-10-CM | POA: Diagnosis not present

## 2021-06-16 DIAGNOSIS — R159 Full incontinence of feces: Secondary | ICD-10-CM | POA: Diagnosis not present

## 2021-07-04 ENCOUNTER — Ambulatory Visit: Payer: Federal, State, Local not specified - PPO | Admitting: Certified Registered"

## 2021-07-04 ENCOUNTER — Ambulatory Visit: Payer: Federal, State, Local not specified - PPO | Admitting: Gastroenterology

## 2021-07-04 ENCOUNTER — Ambulatory Visit
Admission: RE | Admit: 2021-07-04 | Discharge: 2021-07-04 | Disposition: A | Discharge status: Home / Self Care | Payer: Federal, State, Local not specified - PPO | Attending: Gastroenterology | Admitting: Gastroenterology

## 2021-07-04 ENCOUNTER — Encounter
Admission: RE | Disposition: A | Discharge status: Home / Self Care | Payer: Self-pay | Source: Home / Self Care | Attending: Gastroenterology

## 2021-07-04 DIAGNOSIS — K259 Gastric ulcer, unspecified as acute or chronic, without hemorrhage or perforation: Secondary | ICD-10-CM | POA: Diagnosis not present

## 2021-07-04 DIAGNOSIS — K319 Disease of stomach and duodenum, unspecified: Secondary | ICD-10-CM | POA: Insufficient documentation

## 2021-07-04 DIAGNOSIS — K529 Noninfective gastroenteritis and colitis, unspecified: Secondary | ICD-10-CM | POA: Diagnosis not present

## 2021-07-04 DIAGNOSIS — F418 Other specified anxiety disorders: Secondary | ICD-10-CM | POA: Diagnosis not present

## 2021-07-04 DIAGNOSIS — E119 Type 2 diabetes mellitus without complications: Secondary | ICD-10-CM | POA: Insufficient documentation

## 2021-07-04 DIAGNOSIS — I10 Essential (primary) hypertension: Secondary | ICD-10-CM | POA: Insufficient documentation

## 2021-07-04 DIAGNOSIS — G8929 Other chronic pain: Secondary | ICD-10-CM

## 2021-07-04 DIAGNOSIS — Z87891 Personal history of nicotine dependence: Secondary | ICD-10-CM | POA: Insufficient documentation

## 2021-07-04 DIAGNOSIS — K2950 Unspecified chronic gastritis without bleeding: Secondary | ICD-10-CM | POA: Diagnosis not present

## 2021-07-04 DIAGNOSIS — R1013 Epigastric pain: Secondary | ICD-10-CM | POA: Insufficient documentation

## 2021-07-04 DIAGNOSIS — K621 Rectal polyp: Secondary | ICD-10-CM | POA: Insufficient documentation

## 2021-07-04 DIAGNOSIS — R197 Diarrhea, unspecified: Secondary | ICD-10-CM | POA: Diagnosis not present

## 2021-07-04 HISTORY — PX: COLONOSCOPY WITH PROPOFOL: SHX5780

## 2021-07-04 HISTORY — PX: ESOPHAGOGASTRODUODENOSCOPY (EGD) WITH PROPOFOL: SHX5813

## 2021-07-04 SURGERY — COLONOSCOPY WITH PROPOFOL
Anesthesia: General

## 2021-07-04 MED ORDER — MIDAZOLAM HCL 2 MG/2ML IJ SOLN
INTRAMUSCULAR | Status: AC
  Filled 2021-07-04: qty 2

## 2021-07-04 MED ORDER — SODIUM CHLORIDE 0.9 % IV SOLN: INTRAVENOUS | Status: DC

## 2021-07-04 MED ORDER — OMEPRAZOLE 40 MG PO CPDR: 40.0000 mg | DELAYED_RELEASE_CAPSULE | Freq: Two times a day (BID) | ORAL | 2 refills | Status: DC

## 2021-07-04 MED ORDER — GLYCOPYRROLATE 0.2 MG/ML IJ SOLN
INTRAMUSCULAR | Status: DC | PRN
Start: 2021-07-04 — End: 2021-07-04
  Administered 2021-07-04: .2 mg via INTRAVENOUS

## 2021-07-04 MED ORDER — MIDAZOLAM HCL 2 MG/2ML IJ SOLN
INTRAMUSCULAR | Status: DC | PRN
  Administered 2021-07-04: 2 mg via INTRAVENOUS

## 2021-07-04 MED ORDER — PROPOFOL 10 MG/ML IV BOLUS
INTRAVENOUS | Status: DC | PRN
  Administered 2021-07-04: 70 mg via INTRAVENOUS
  Administered 2021-07-04: 30 mg via INTRAVENOUS

## 2021-07-04 MED ORDER — PROPOFOL 500 MG/50ML IV EMUL
INTRAVENOUS | Status: DC | PRN
  Administered 2021-07-04: 155 ug/kg/min via INTRAVENOUS

## 2021-07-04 MED ORDER — LIDOCAINE HCL (CARDIAC) PF 100 MG/5ML IV SOSY
PREFILLED_SYRINGE | INTRAVENOUS | Status: DC | PRN
Start: 2021-07-04 — End: 2021-07-04
  Administered 2021-07-04: 100 mg via INTRAVENOUS

## 2021-07-04 NOTE — Anesthesia Preprocedure Evaluation (Signed)
Anesthesia Evaluation  Patient identified by MRN, date of birth, ID band Patient awake    Reviewed: Allergy & Precautions, NPO status , Patient's Chart, lab work & pertinent test results  Airway Mallampati: II  TM Distance: >3 FB Neck ROM: full    Dental  (+) Teeth Intact   Pulmonary neg pulmonary ROS, former smoker,    Pulmonary exam normal breath sounds clear to auscultation       Cardiovascular Exercise Tolerance: Good hypertension, Pt. on medications negative cardio ROS Normal cardiovascular exam Rhythm:Regular     Neuro/Psych  Headaches, Anxiety Depression negative neurological ROS  negative psych ROS   GI/Hepatic negative GI ROS, Neg liver ROS, GERD  ,  Endo/Other  negative endocrine ROSdiabetes, Well Controlled, Type 2  Renal/GU negative Renal ROS  negative genitourinary   Musculoskeletal  (+) Arthritis ,   Abdominal Normal abdominal exam  (+)   Peds negative pediatric ROS (+)  Hematology negative hematology ROS (+)   Anesthesia Other Findings Past Medical History: No date: Anxiety     Comment:  no current meds No date: Arthritis     Comment:  left knee pain - otc med prn No date: Chronic insomnia No date: Depression No date: Diabetes mellitus without complication ([facility]) No date: Dysrhythmia     Comment:  Home with daughter Stacey Stanley No date: Endometriosis No date: GERD (gastroesophageal reflux disease)     Comment:  diet control - no meds No date: Gout     Comment:  wrist, feet, hands bilateral No date: Headache(784.0)     Comment:  hx - last one 2 yrs ago - no meds No date: History of kidney stones No date: Hypertension No date: Metabolic syndrome No date: Obesity 2017: Right thyroid nodule     Comment:  benign No date: Serum calcium elevated No date: Sleep disorder, circadian, shift work type No date: SVD (spontaneous vaginal delivery)     Comment:  x 2 No date: Tachycardia      Comment:  History - neg cardiac tests-stress related  Past Surgical History: 05/21/2016: ABDOMINAL HYSTERECTOMY 2019: COLONOSCOPY No date: DILATION AND CURETTAGE OF UTERUS 06/28/2013: EYE SURGERY     Comment:  right laser eye surgery - repair retina 04/04/2018: HEMORROIDECTOMY     Comment:  Dr. Levin Bacon  07/19/2013: HYSTEROSCOPY WITH D & C; N/A     Comment:  Procedure: DILATATION AND CURETTAGE /HYSTEROSCOPY;                Surgeon: Marvene Staff, MD;  Location: [facility];                Service: Gynecology;  Laterality: N/A; No date: INDUCED ABORTION 1992: LAPAROSCOPY     Comment:  endometriosis 07/19/2013: LAPAROSCOPY; N/A     Comment:  Procedure: LAPAROSCOPY DIAGNOSTIC  with resection of               endometriosis;  Surgeon: Marvene Staff, MD;                Location: [facility];  Service: Gynecology;  Laterality: N/A; 2003: metatarsil      Comment:  left foot surgery   BMI    Body Mass Index: 26.63 kg/m      Reproductive/Obstetrics negative OB ROS                             Anesthesia Physical Anesthesia Plan  ASA: 2  Anesthesia  Plan: General   Post-op Pain Management:    Induction: Intravenous  PONV Risk Score and Plan: Propofol infusion and TIVA  Airway Management Planned: Natural Airway and Nasal Cannula  Additional Equipment:   Intra-op Plan:   Post-operative Plan:   Informed Consent: I have reviewed the patients History and Physical, chart, labs and discussed the procedure including the risks, benefits and alternatives for the proposed anesthesia with the patient or authorized representative who has indicated his/her understanding and acceptance.     Dental Advisory Given  Plan Discussed with: CRNA and Surgeon  Anesthesia Plan Comments:         Anesthesia Quick Evaluation

## 2021-07-04 NOTE — Anesthesia Procedure Notes (Signed)
Procedure Name: General with mask airway Date/Time: 07/04/2021 10:04 AM Performed by: Kelton Pillar, CRNA Pre-anesthesia Checklist: Patient identified, Emergency Drugs available, Suction available and Patient being monitored Patient Re-evaluated:Patient Re-evaluated prior to induction Oxygen Delivery Method: Simple face mask Induction Type: IV induction Placement Confirmation: positive ETCO2 and CO2 detector Dental Injury: Teeth and Oropharynx as per pre-operative assessment

## 2021-07-04 NOTE — Op Note (Signed)
[facility] Gastroenterology Patient Name: Stacey Stanley Procedure Date: 07/04/2021 9:59 AM MRN: [id number] Account #: [id number] Date of Birth: 1975/08/16 Admit Type: Outpatient Age: 46 Room: [facility] ENDO ROOM 2 Gender: Female Note Status: Finalized Instrument Name: Jasper Riling [id number] Procedure:             Colonoscopy Indications:           Chronic diarrhea, Clinically significant diarrhea of                         unexplained origin Providers:             Lin Landsman MD, MD Referring MD:          No Local Md, MD (Referring MD) Medicines:             General Anesthesia Complications:         No immediate complications. Estimated blood loss: None. Procedure:             Pre-Anesthesia Assessment:                        - Prior to the procedure, a History and Physical was                         performed, and patient medications and allergies were                         reviewed. The patient is competent. The risks and                         benefits of the procedure and the sedation options and                         risks were discussed with the patient. All questions                         were answered and informed consent was obtained.                         Patient identification and proposed procedure were                         verified by the physician, the nurse, the                         anesthesiologist, the anesthetist and the technician                         in the pre-procedure area in the procedure room in the                         endoscopy suite. Mental Status Examination: alert and                         oriented. Airway Examination: normal oropharyngeal                         airway and neck mobility. Respiratory Examination:  clear to auscultation. CV Examination: normal.                         Prophylactic Antibiotics: The patient does not require                         prophylactic antibiotics.  Prior Anticoagulants: The                         patient has taken no previous anticoagulant or                         antiplatelet agents. ASA Grade Assessment: II - A                         patient with mild systemic disease. After reviewing                         the risks and benefits, the patient was deemed in                         satisfactory condition to undergo the procedure. The                         anesthesia plan was to use general anesthesia.                         Immediately prior to administration of medications,                         the patient was re-assessed for adequacy to receive                         sedatives. The heart rate, respiratory rate, oxygen                         saturations, blood pressure, adequacy of pulmonary                         ventilation, and response to care were monitored                         throughout the procedure. The physical status of the                         patient was re-assessed after the procedure.                        After obtaining informed consent, the colonoscope was                         passed under direct vision. Throughout the procedure,                         the patient's blood pressure, pulse, and oxygen                         saturations were monitored continuously. The  Colonoscope was introduced through the anus and                         advanced to the the terminal ileum, with                         identification of the appendiceal orifice and IC                         valve. The colonoscopy was performed without                         difficulty. The patient tolerated the procedure well.                         The quality of the bowel preparation was fair. Findings:      About 2cm round, raised hyperpigmented lesion near the gluteal cleft,       ?perianal wart      The perianal and digital rectal examinations were normal. Pertinent       negatives include  normal sphincter tone and no palpable rectal lesions.      The terminal ileum appeared normal.      Four sessile polyps were found in the rectum. The polyps were 4 to 5 mm       in size. These polyps were removed with a cold snare. Resection and       retrieval were complete.      Normal mucosa was found in the entire colon. Biopsies were taken with a       cold forceps for histology. Impression:            - Preparation of the colon was fair.                        - The examined portion of the ileum was normal.                        - Four 4 to 5 mm polyps in the rectum, removed with a                         cold snare. Resected and retrieved.                        - Normal mucosa in the entire examined colon. Biopsied. Recommendation:        - Discharge patient to home (with escort).                        - Resume previous diet today.                        - Continue present medications.                        - Await pathology results.                        - Return to my office as previously scheduled. Procedure Code(s):     --- Professional ---  45385, Colonoscopy, flexible; with removal of                         tumor(s), polyp(s), or other lesion(s) by snare                         technique                        45380, 43, Colonoscopy, flexible; with biopsy, single                         or multiple Diagnosis Code(s):     --- Professional ---                        K62.1, Rectal polyp                        K52.9, Noninfective gastroenteritis and colitis,                         unspecified                        R19.7, Diarrhea, unspecified CPT copyright 2019 American Medical Association. All rights reserved. The codes documented in this report are preliminary and upon coder review may  be revised to meet current compliance requirements. Dr. Ulyess Mort Lin Landsman MD, MD 07/04/2021 10:44:11 AM This report has been signed  electronically. Number of Addenda: 0 Note Initiated On: 07/04/2021 9:59 AM Scope Withdrawal Time: 0 hours 15 minutes 13 seconds  Total Procedure Duration: 0 hours 19 minutes 8 seconds  Estimated Blood Loss:  Estimated blood loss: none.      [name]

## 2021-07-04 NOTE — H&P (Signed)
Cephas Darby, MD [address]  [place]  [place], [place] [address]  Main: [phone]  Fax: [phone] Pager: [phone]  Primary Care Physician:  Steele Sizer, MD Primary Gastroenterologist:  Dr. Cephas Darby  Pre-Procedure History & Physical: HPI:  Stacey Stanley is a 46 y.o. female is here for an endoscopy and colonoscopy.   Past Medical History:  Diagnosis Date   Anxiety    no current meds   Arthritis    left knee pain - otc med prn   Chronic insomnia    Depression    Diabetes mellitus without complication ([employer])    Dysrhythmia    Home with daughter [name]   Endometriosis    GERD (gastroesophageal reflux disease)    diet control - no meds   Gout    wrist, feet, hands bilateral   Headache(784.0)    hx - last one 2 yrs ago - no meds   History of kidney stones    Hypertension    Metabolic syndrome    Obesity    Right thyroid nodule 2017   benign   Serum calcium elevated    Sleep disorder, circadian, shift work type    SVD (spontaneous vaginal delivery)    x 2   Tachycardia    History - neg cardiac tests-stress related    Past Surgical History:  Procedure Laterality Date   ABDOMINAL HYSTERECTOMY  05/21/2016   COLONOSCOPY  2019   DILATION AND CURETTAGE OF UTERUS     EYE SURGERY  06/28/2013   right laser eye surgery - repair retina   HEMORROIDECTOMY  04/04/2018   Dr. Levin Bacon    HYSTEROSCOPY WITH D & C N/A 07/19/2013   Procedure: DILATATION AND CURETTAGE Pollyann Glen;  Surgeon: Marvene Staff, MD;  Location: [facility];  Service: Gynecology;  Laterality: N/A;   INDUCED ABORTION     LAPAROSCOPY  1992   endometriosis   LAPAROSCOPY N/A 07/19/2013   Procedure: LAPAROSCOPY DIAGNOSTIC  with resection of endometriosis;  Surgeon: Marvene Staff, MD;  Location: [facility];  Service: Gynecology;  Laterality: N/A;   metatarsil   2003   left foot surgery     Prior to Admission medications   Medication Sig Start Date End Date  Taking? Authorizing Provider  amitriptyline (ELAVIL) 10 MG tablet Take 2 tablets (20 mg total) by mouth at bedtime. 06/03/21 07/03/21  Lin Landsman, MD  cholestyramine Lucrezia Starch) 4 g packet as directed orally 2 times a day for 30 day(s) 02/13/21   [provider]  Dapsone 5 % topical gel Apply 60 g topically daily. 09/21/19   [provider]  hydrochlorothiazide (HYDRODIURIL) 12.5 MG tablet Take 1 tablet (12.5 mg total) by mouth daily. 05/09/21   Steele Sizer, MD  imiquimod Leroy Sea) 5 % cream Apply 1 packet topically daily as needed (warts). 09/21/19   [provider]  metFORMIN (GLUCOPHAGE-XR) 750 MG 24 hr tablet Take 1 tablet (750 mg total) by mouth daily with breakfast. 05/09/21   Ancil Boozer, Drue Stager, MD  nicotine (NICODERM CQ - DOSED IN MG/24 HOURS) 21 mg/24hr patch Place 21 mg onto the skin daily as needed (nicotine).    [provider]  Omega-3 Fatty Acids (FISH OIL) 1000 MG CAPS Take 2,000 mg by mouth daily.    [provider]  ondansetron (ZOFRAN) 4 MG tablet 1 tab(s) orally 3 times a day for 30 days 02/13/21   [provider]  Plecanatide (TRULANCE) 3 MG TABS 1 tab(s) orally  once a day for 90 days 05/22/21   [provider]  tapentadol HCl (NUCYNTA) 75 MG tablet Take 1 tablet (75 mg total) by mouth 2 (two) times daily as needed. 06/22/21 07/22/21  [name], MD  TRI-LUMA 0.01-4-0.05 % CREA Apply 30 mg topically daily. 09/21/19   [provider]  vitamin C (ASCORBIC ACID) 500 MG tablet Take 500 mg by mouth daily.    [provider]    Allergies as of 06/03/2021 - Review Complete 06/03/2021  Allergen Reaction Noted   Nuvigil [armodafinil]  07/27/2017   Ppd [tuberculin purified protein derivative]  12/13/2020    Family History  Problem Relation Age of Onset   Diabetes Mother    Hypertension Mother    Hypertension Father    Kidney disease Father    Stroke Maternal Grandmother    Leukemia Paternal  Grandmother    Alzheimer's disease Paternal Grandfather    Heart disease Brother    Anesthesia problems Neg Hx    Hypotension Neg Hx    Malignant hyperthermia Neg Hx    Pseudochol deficiency Neg Hx     Social History   Socioeconomic History   Marital status: Married    Spouse name: Elberta Fortis   Number of children: 2   Years of education: 16   Highest education level: Bachelor's degree (e.g., BA, AB, BS)  Occupational History   Not on file  Tobacco Use   Smoking status: Former    Packs/day: 0.50    Years: 25.00    Pack years: 12.50    Types: Cigarettes    Start date: 11/25/1993   Smokeless tobacco: Never  Vaping Use   Vaping Use: Former   Start date: 06/01/2014   Quit date: 06/02/2015  Substance and Sexual Activity   Alcohol use: No    Alcohol/week: 0.0 standard drinks   Drug use: No   Sexual activity: Not on file    Comment: Hysterectomy  Other Topics Concern   Not on file  Social History Narrative   Married for the past 3 years.    Both had children previously.    They work different [employer]   She works full time at the [employer] and is going to resume NP school Fall 2020   Social Determinants of Radio broadcast assistant Strain: Low Risk    Difficulty of Paying Living Expenses: Not hard at all  Food Insecurity: No Food Insecurity   Worried About Charity fundraiser in the Last Year: Never true   Arboriculturist in the Last Year: Never true  Transportation Needs: No Transportation Needs   Lack of Transportation (Medical): No   Lack of Transportation (Non-Medical): No  Physical Activity: Insufficiently Active   Days of Exercise per Week: 3 days   Minutes of Exercise per Session: 30 min  Stress: No Stress Concern Present   Feeling of Stress : Not at all  Social Connections: Moderately Isolated   Frequency of Communication with Friends and Family: More than three times a week   Frequency of Social Gatherings with Friends and Family: Never   Attends Religious Services:  Never   Marine scientist or Organizations: No   Attends Music therapist: Never   Marital Status: Married  Human resources officer Violence: Not At Risk   Fear of Current or Ex-Partner: No   Emotionally Abused: No   Physically Abused: No   Sexually Abused: No    Review of Systems: See HPI, otherwise  negative ROS  Physical Exam: BP 102/70    Pulse 78    Temp (!) 96.6 F (35.9 C) (Temporal)    Resp (!) 32    Ht 5\' 5"  (1.651 m)    Wt 160 lb (72.6 kg)    LMP 07/09/2015 (Exact Date)    SpO2 100%    BMI 26.63 kg/m  General:   Alert,  pleasant and cooperative in NAD Head:  Normocephalic and atraumatic. Neck:  Supple; no masses or thyromegaly. Lungs:  Clear throughout to auscultation.    Heart:  Regular rate and rhythm. Abdomen:  Soft, nontender and nondistended. Normal bowel sounds, without guarding, and without rebound.   Neurologic:  Alert and  oriented x4;  grossly normal neurologically.  Impression/Plan: [name] is here for an endoscopy and colonoscopy to be performed for epigastric pain, Nonbloody diarrhea with abdominal bloating and discomfort  Risks, benefits, limitations, and alternatives regarding  endoscopy and colonoscopy have been reviewed with the patient.  Questions have been answered.  All parties agreeable.   Sherri Sear, MD  07/04/2021, 11:01 AM

## 2021-07-04 NOTE — Op Note (Signed)
[facility] Gastroenterology Patient Name: Stacey Stanley Procedure Date: 07/04/2021 10:00 AM MRN: [id number] Account #: [id number] Date of Birth: 07-03-1975 Admit Type: Outpatient Age: 46 Room: [facility] ENDO ROOM 2 Gender: Female Note Status: Finalized Instrument Name: Upper Endoscope [id number] Procedure:             Upper GI endoscopy Indications:           Epigastric abdominal pain Providers:             Lin Landsman MD, MD Referring MD:          No Local Md, MD (Referring MD) Medicines:             General Anesthesia Complications:         No immediate complications. Estimated blood loss: None. Procedure:             Pre-Anesthesia Assessment:                        - Prior to the procedure, a History and Physical was                         performed, and patient medications and allergies were                         reviewed. The patient is competent. The risks and                         benefits of the procedure and the sedation options and                         risks were discussed with the patient. All questions                         were answered and informed consent was obtained.                         Patient identification and proposed procedure were                         verified by the physician, the nurse, the                         anesthesiologist, the anesthetist and the technician                         in the pre-procedure area in the procedure room in the                         endoscopy suite. Mental Status Examination: alert and                         oriented. Airway Examination: normal oropharyngeal                         airway and neck mobility. Respiratory Examination:                         clear to auscultation. CV Examination: normal.  Prophylactic Antibiotics: The patient does not require                         prophylactic antibiotics. Prior Anticoagulants: The                         patient  has taken no previous anticoagulant or                         antiplatelet agents. ASA Grade Assessment: II - A                         patient with mild systemic disease. After reviewing                         the risks and benefits, the patient was deemed in                         satisfactory condition to undergo the procedure. The                         anesthesia plan was to use general anesthesia.                         Immediately prior to administration of medications,                         the patient was re-assessed for adequacy to receive                         sedatives. The heart rate, respiratory rate, oxygen                         saturations, blood pressure, adequacy of pulmonary                         ventilation, and response to care were monitored                         throughout the procedure. The physical status of the                         patient was re-assessed after the procedure.                        After obtaining informed consent, the endoscope was                         passed under direct vision. Throughout the procedure,                         the patient's blood pressure, pulse, and oxygen                         saturations were monitored continuously. The Endoscope                         was introduced through the mouth, and advanced to the  second part of duodenum. The upper GI endoscopy was                         accomplished without difficulty. The patient tolerated                         the procedure well. Findings:      The duodenal bulb and second portion of the duodenum were normal.      Multiple dispersed small erosions with stigmata of recent bleeding were       found in the gastric antrum and in the prepyloric region of the stomach.       Biopsies were taken with a cold forceps for Helicobacter pylori testing.      The gastric body was normal. Biopsies were taken with a cold forceps for        Helicobacter pylori testing.      The cardia and gastric fundus were normal on retroflexion.      The gastroesophageal junction and examined esophagus were normal. Impression:            - Normal duodenal bulb and second portion of the                         duodenum.                        - Erosive gastropathy with stigmata of recent                         bleeding. Biopsied.                        - Normal gastric body. Biopsied.                        - Normal gastroesophageal junction and esophagus. Recommendation:        - Await pathology results.                        - Use Prilosec (omeprazole) 40 mg PO BID for 1 month.                        - Return to my office as previously scheduled.                        - Proceed with colonoscopy as scheduled                        See colonoscopy report Procedure Code(s):     --- Professional ---                        [phone], Esophagogastroduodenoscopy, flexible,                         transoral; with biopsy, single or multiple Diagnosis Code(s):     --- Professional ---                        K92.2, Gastrointestinal hemorrhage, unspecified  R10.13, Epigastric pain CPT copyright 2019 American Medical Association. All rights reserved. The codes documented in this report are preliminary and upon coder review may  be revised to meet current compliance requirements. Dr. Ulyess Mort Lin Landsman MD, MD 07/04/2021 10:19:56 AM This report has been signed electronically. Number of Addenda: 0 Note Initiated On: 07/04/2021 10:00 AM Estimated Blood Loss:  Estimated blood loss: none.      [facility]

## 2021-07-04 NOTE — Transfer of Care (Signed)
Immediate Anesthesia Transfer of Care Note  Patient: Stacey Stanley  Procedure(s) Performed: COLONOSCOPY WITH PROPOFOL ESOPHAGOGASTRODUODENOSCOPY (EGD) WITH PROPOFOL  Patient Location: Endoscopy Unit  Anesthesia Type:General  Level of Consciousness: awake, drowsy and patient cooperative  Airway & Oxygen Therapy: Patient Spontanous Breathing and Patient connected to face mask oxygen  Post-op Assessment: Report given to RN and Post -op Vital signs reviewed and stable  Post vital signs: Reviewed and stable  Last Vitals:  Vitals Value Taken Time  BP    Temp    Pulse 86 07/04/21 1043  Resp 20 07/04/21 1043  SpO2 100 % 07/04/21 1043  Vitals shown include unvalidated device data.  Last Pain:  Vitals:   07/04/21 0949  TempSrc: Temporal  PainSc: 4          Complications: No notable events documented.

## 2021-07-04 NOTE — Anesthesia Postprocedure Evaluation (Signed)
Anesthesia Post Note  Patient: Stacey Stanley  Procedure(s) Performed: COLONOSCOPY WITH PROPOFOL ESOPHAGOGASTRODUODENOSCOPY (EGD) WITH PROPOFOL  Patient location during evaluation: PACU Anesthesia Type: General Level of consciousness: awake and awake and alert Pain management: satisfactory to patient Vital Signs Assessment: post-procedure vital signs reviewed and stable Respiratory status: spontaneous breathing and respiratory function stable Cardiovascular status: stable Anesthetic complications: no   No notable events documented.   Last Vitals:  Vitals:   07/04/21 1104 07/04/21 1114  BP: 135/86   Pulse: 80 79  Resp: 16 10  Temp:    SpO2: 100% 100%    Last Pain:  Vitals:   07/04/21 1124  TempSrc:   PainSc: 0-No pain                 VAN Stanley,Stacey Rosario

## 2021-07-07 ENCOUNTER — Encounter: Payer: Self-pay | Admitting: Gastroenterology

## 2021-07-08 ENCOUNTER — Other Ambulatory Visit: Payer: Self-pay | Admitting: Gastroenterology

## 2021-07-08 ENCOUNTER — Other Ambulatory Visit: Payer: Self-pay | Admitting: Family Medicine

## 2021-07-08 LAB — SURGICAL PATHOLOGY

## 2021-07-10 DIAGNOSIS — L810 Postinflammatory hyperpigmentation: Secondary | ICD-10-CM | POA: Diagnosis not present

## 2021-07-10 DIAGNOSIS — C44529 Squamous cell carcinoma of skin of other part of trunk: Secondary | ICD-10-CM | POA: Diagnosis not present

## 2021-07-10 DIAGNOSIS — L700 Acne vulgaris: Secondary | ICD-10-CM | POA: Diagnosis not present

## 2021-07-10 DIAGNOSIS — D485 Neoplasm of uncertain behavior of skin: Secondary | ICD-10-CM | POA: Diagnosis not present

## 2021-07-14 ENCOUNTER — Encounter: Payer: Self-pay | Admitting: Gastroenterology

## 2021-07-15 ENCOUNTER — Ambulatory Visit
Payer: Federal, State, Local not specified - PPO | Attending: Student in an Organized Health Care Education/Training Program | Admitting: Student in an Organized Health Care Education/Training Program

## 2021-07-15 ENCOUNTER — Encounter: Payer: Self-pay | Admitting: Student in an Organized Health Care Education/Training Program

## 2021-07-15 ENCOUNTER — Other Ambulatory Visit: Payer: Self-pay

## 2021-07-15 VITALS — BP 91/61 | HR 81 | Temp 98.1°F | Resp 16 | Ht 65.0 in | Wt 160.0 lb

## 2021-07-15 DIAGNOSIS — E8881 Metabolic syndrome: Secondary | ICD-10-CM | POA: Diagnosis not present

## 2021-07-15 DIAGNOSIS — R102 Pelvic and perineal pain: Secondary | ICD-10-CM | POA: Diagnosis not present

## 2021-07-15 DIAGNOSIS — G4700 Insomnia, unspecified: Secondary | ICD-10-CM | POA: Diagnosis not present

## 2021-07-15 DIAGNOSIS — N809 Endometriosis, unspecified: Secondary | ICD-10-CM | POA: Diagnosis not present

## 2021-07-15 DIAGNOSIS — Z79899 Other long term (current) drug therapy: Secondary | ICD-10-CM | POA: Diagnosis not present

## 2021-07-15 DIAGNOSIS — G894 Chronic pain syndrome: Secondary | ICD-10-CM | POA: Diagnosis not present

## 2021-07-15 DIAGNOSIS — M5412 Radiculopathy, cervical region: Secondary | ICD-10-CM | POA: Insufficient documentation

## 2021-07-15 DIAGNOSIS — F320 Major depressive disorder, single episode, mild: Secondary | ICD-10-CM | POA: Diagnosis not present

## 2021-07-15 DIAGNOSIS — G8929 Other chronic pain: Secondary | ICD-10-CM | POA: Diagnosis not present

## 2021-07-15 MED ORDER — TAPENTADOL HCL 75 MG PO TABS: 75.0000 mg | ORAL_TABLET | Freq: Two times a day (BID) | ORAL | 0 refills | Status: AC | PRN

## 2021-07-15 MED ORDER — TAPENTADOL HCL 75 MG PO TABS: 75.0000 mg | ORAL_TABLET | Freq: Two times a day (BID) | ORAL | 0 refills | Status: DC | PRN

## 2021-07-15 NOTE — Progress Notes (Signed)
PROVIDER NOTE: Information contained herein reflects review and annotations entered in association with encounter. Interpretation of such information and data should be left to medically-trained personnel. Information provided to patient can be located elsewhere in the medical record under "Patient Instructions". Document created using STT-dictation technology, any transcriptional errors that may result from process are unintentional.    Patient: Stacey Stanley  Service Category: E/M  Provider: [name], MD  DOB: 30-Jan-1976  DOS: 07/15/2021  Specialty: Interventional Pain Management  MRN: [id number]  Setting: Ambulatory outpatient  PCP: Steele Sizer, MD  Type: Established Patient    Referring Provider: Steele Sizer, MD  Location: Office  Delivery: Face-to-face     HPI  [name], a 46 y.o. year old female, is here today because of her Chronic pain syndrome [G89.4]. Ms. Godar primary complain today is Pelvic Pain (Entire pelvis )  Last encounter: My last encounter with her was on 04/08/21 Pertinent problems: Ms. Lagrand has Endometriosis; Chronic pelvic pain in female; Cervical radiculitis; Pelvic pain; Chronic pain syndrome; and Opiate use on their pertinent problem list. Pain Assessment: Severity of Chronic pain is reported as a 8 /10. Location: Pelvis Right, Left/occassionally it will radiate around to the back more so on the left.. Onset: More than a month ago. Quality: Discomfort, Constant, Sharp. Timing: Constant. Modifying factor(s): warm bath, heating pad, medications work but not as good as they used to.. Vitals:  height is _0  (1.651 m) and weight is 160 lb (72.6 kg). Her temporal temperature is 98.1 F (36.7 C). Her blood pressure is 91/61 and her pulse is 81. Her respiration is 16 and oxygen saturation is 99%.   Reason for encounter: medication management.    Since Rabab's last visit with me she had a colon anoscopy that revealed colitis.  She was instructed  to refrain from NSAIDs was placed on a high-dose PPI.  She was also instructed to avoid any dietary triggers that may increase her pain.  She does have chronic diarrhea. History of robotic cholecystectomy on December 13 2020.  She continues to struggle with intermittent abdominal pain as well as diarrhea.  She has an upcoming appointment with GI. Otherwise UDS up-to-date and appropriate, refill of Nucynta as below requested quantity increase to 60 tablets/month so that she can take up to 2 tablets/day.  This is reasonable.  No further dose escalation beyond this.  Pharmacotherapy Assessment   Analgesic: Nucynta 75 mg twice daily as needed, quantity 60/month;  Monitoring: [place] PMP: PDMP not reviewed this encounter.       Pharmacotherapy: No side-effects or adverse reactions reported. Compliance: No problems identified. Effectiveness: Clinically acceptable.  [name], RN  07/15/2021  8:06 AM  Sign when Signing Visit Nursing Pain Medication Assessment:  Safety precautions to be maintained throughout the outpatient stay will include: orient to surroundings, keep bed in low position, maintain call bell within reach at all times, provide assistance with transfer out of bed and ambulation.  Medication Inspection Compliance: Pill count conducted under aseptic conditions, in front of the patient. Neither the pills nor the bottle was removed from the patient's sight at any time. Once count was completed pills were immediately returned to the patient in their original bottle.  Medication: Tapentadol (Nucynta) Pill/Patch Count:  2 of 45 pills remain Pill/Patch Appearance: Markings consistent with prescribed medication Bottle Appearance: Standard pharmacy container. Clearly labeled. Filled Date: 01 / 22 / 2023 Last Medication intake:  Yesterday   UDS:  Summary  Date  Value Ref Range Status  12/31/2020 Note  Final    Comment:     ==================================================================== [employer] Select 13 (MW) ==================================================================== Test                             Result       Flag       Units  Drug Present and Declared for Prescription Verification   Tapentadol                     >3390        EXPECTED   ng/mg creat    Source of tapentadol is a scheduled prescription medication.  ==================================================================== Test                      Result    Flag   Units      Ref Range   Creatinine              295              mg/dL      >=20 ==================================================================== Declared Medications:  The flagging and interpretation on this report are based on the  following declared medications.  Unexpected results may arise from  inaccuracies in the declared medications.   **Note: The testing scope of this panel includes these medications:   Tapentadol (Nucynta)   **Note: The testing scope of this panel does not include the  following reported medications:   Dapsone  Fish Oil  Hydrochlorothiazide (Hydrodiuril)  Ibuprofen (Advil)  Imiquimod (Aldara)  Metformin (Glucophage)  Nicotine  Promethazine (Phenergan)  Semaglutide (Ozempic)  Topical  Vitamin C ==================================================================== For clinical consultation, please call [phone]. ====================================================================      ROS  Constitutional: Denies any fever or chills Gastrointestinal: Intermittent abdominal pain, diarrhea Musculoskeletal: Denies any acute onset joint swelling, redness, loss of ROM, or weakness Neurological: No reported episodes of acute onset apraxia, aphasia, dysarthria, agnosia, amnesia, paralysis, loss of coordination, or loss of consciousness  Medication Review  Dapsone, Fish Oil, Fluocin-Hydroquinone-Tretinoin, Plecanatide,  amitriptyline, aspirin EC, cholestyramine, hydrochlorothiazide, imiquimod, metFORMIN, nicotine, omeprazole, ondansetron, tapentadol HCl, and vitamin C  History Review  Allergy: Ms. Momon is allergic to nuvigil [armodafinil] and ppd [tuberculin purified protein derivative]. Drug: Ms. Aguado  reports no history of drug use. Alcohol:  reports no history of alcohol use. Tobacco:  reports that she has quit smoking. Her smoking use included cigarettes. She started smoking about 27 years ago. She has a 12.50 pack-year smoking history. She has never used smokeless tobacco. Social: Ms. Winterrowd  reports that she has quit smoking. Her smoking use included cigarettes. She started smoking about 27 years ago. She has a 12.50 pack-year smoking history. She has never used smokeless tobacco. She reports that she does not drink alcohol and does not use drugs. Medical:  has a past medical history of Anxiety, Arthritis, Chronic insomnia, Depression, Diabetes mellitus without complication ([place]), Dysrhythmia, Endometriosis, GERD (gastroesophageal reflux disease), Gout, Headache(784.0), History of kidney stones, Hypertension, Metabolic syndrome, Obesity, Right thyroid nodule (2017), Serum calcium elevated, Sleep disorder, circadian, shift work type, SVD (spontaneous vaginal delivery), and Tachycardia. Surgical: Ms. Felling  has a past surgical history that includes metatarsil  (2003); Dilation and curettage of uterus; Induced abortion; laparoscopy (1992); Eye surgery (06/28/2013); laparoscopy (N/A, 07/19/2013); Hysteroscopy with D & C (N/A, 07/19/2013); Abdominal hysterectomy (05/21/2016); Hemorroidectomy (04/04/2018); Colonoscopy (2019); Colonoscopy with propofol (N/A, 07/04/2021); and Esophagogastroduodenoscopy (egd) with  propofol (N/A, 07/04/2021). Family: family history includes Alzheimer's disease in her paternal grandfather; Diabetes in her mother; Heart disease in her brother; Hypertension in her father and mother; Kidney  disease in her father; Leukemia in her paternal grandmother; Stroke in her maternal grandmother.  Laboratory Chemistry Profile   Renal Lab Results  Component Value Date   BUN 12 04/29/2021   CREATININE 0.65 46/96/2952   BCR NOT APPLICABLE 84/13/2440   GFRAA 108 11/28/2020   GFRNONAA >60 04/29/2021     Hepatic Lab Results  Component Value Date   AST 13 (L) 04/29/2021   ALT 9 04/29/2021   ALBUMIN 4.4 04/29/2021   ALKPHOS 41 04/29/2021   AMYLASE 102 (H) 05/22/2016   LIPASE 28 04/29/2021     Electrolytes Lab Results  Component Value Date   NA 139 04/29/2021   K 3.5 04/29/2021   CL 105 04/29/2021   CALCIUM 10.5 (H) 04/29/2021   MG 1.5 09/15/2017     Bone Lab Results  Component Value Date   VD25OH 34 08/01/2018     Inflammation (CRP: Acute Phase) (ESR: Chronic Phase) Lab Results  Component Value Date   CRP 0.2 05/06/2020   ESRSEDRATE 2 05/06/2020       Note: Above Lab results reviewed.  Recent Imaging Review  MR Abdomen W Wo Contrast CLINICAL DATA:  Splenic hypodensity on prior CT.  EXAM: MRI ABDOMEN WITHOUT AND WITH CONTRAST  TECHNIQUE: Multiplanar multisequence MR imaging of the abdomen was performed both before and after the administration of intravenous contrast.  CONTRAST:  7.63m GADAVIST GADOBUTROL 1 MMOL/ML IV SOLN  COMPARISON:  CT scan levin 02/18/2021  FINDINGS: Lower chest: Unremarkable  Hepatobiliary: No suspicious focal abnormality within the liver parenchyma. Nonvisualization of the gallbladder suggest prior cholecystectomy. No intrahepatic or extrahepatic biliary dilation.  Pancreas: No focal mass lesion. No dilatation of the main duct. No intraparenchymal cyst. No peripancreatic edema.  Spleen: 9 mm T2 intermediate intensity lesion identified posterior spleen as noted on recent CT. Lesion cannot be definitively characterized by MRI.  Adrenals/Urinary Tract: No adrenal nodule or mass. Tiny foci of hypoenhancement in both kidneys  are too small to characterize but likely cyst.  Stomach/Bowel: Stomach is unremarkable. No gastric wall thickening. No small bowel or colonic dilatation within the visualized abdomen.  Vascular/Lymphatic: No abdominal aortic aneurysm. No evidence for abdominal lymphadenopathy.  Other:  No intraperitoneal free fluid.  Musculoskeletal: No focal suspicious marrow enhancement within the visualized bony anatomy.  IMPRESSION: 9 mm T2 intermediate intensity lesion identified posterior spleen as noted on recent CT. Lesion cannot be definitively characterized by MRI, but is likely benign. Follow-up MRI in 6 months could be used to ensure stability.  Otherwise unremarkable exam.  Electronically Signed   By: EMisty StanleyM.D.   On: 05/21/2021 11:33  Note: Reviewed        Physical Exam  General appearance: Well nourished, well developed, and well hydrated. In no apparent acute distress Mental status: Alert, oriented x 3 (person, place, & time)       Respiratory: No evidence of acute respiratory distress Eyes: PERLA Vitals: BP 91/61 (BP Location: Right Arm, Patient Position: Sitting, Cuff Size: Normal)    Pulse 81    Temp 98.1 F (36.7 C) (Temporal)    Resp 16    Ht _0  (1.651 m)    Wt 160 lb (72.6 kg)    LMP 07/09/2015 (Exact Date)    SpO2 99%    BMI 26.63 kg/m  BMI: Estimated  body mass index is 26.63 kg/m as calculated from the following:   Height as of this encounter: _0  (1.651 m).   Weight as of this encounter: 160 lb (72.6 kg). Ideal: Ideal body weight: 57 kg (125 lb 10.6 oz) Adjusted ideal body weight: 63.2 kg (139 lb 6.4 oz)  5 out of 5 strength bilateral lower extremity: Plantar flexion, dorsiflexion, knee flexion, knee extension.   Assessment   Status Diagnosis  Controlled Controlled Controlled 1. Chronic pain syndrome   2. Chronic pelvic pain in female   3. Endometriosis   4. Insomnia, persistent   5. Mild major depression (HCC)   6. Cervical radiculitis    7. Long term prescription benzodiazepine use   8. Pelvic pain   9. Dysmetabolic syndrome        Plan of Care   [name] has a current medication list which includes the following long-term medication(s): amitriptyline, cholestyramine, hydrochlorothiazide, metformin, and omeprazole.  Pharmacotherapy (Medications Ordered): Meds ordered this encounter  Medications   tapentadol HCl (NUCYNTA) 75 MG tablet    Sig: Take 1 tablet (75 mg total) by mouth 2 (two) times daily as needed.    Dispense:  60 tablet    Refill:  0    Chronic pain syndrome   tapentadol HCl (NUCYNTA) 75 MG tablet    Sig: Take 1 tablet (75 mg total) by mouth 2 (two) times daily as needed.    Dispense:  60 tablet    Refill:  0    Chronic pain syndrome   tapentadol HCl (NUCYNTA) 75 MG tablet    Sig: Take 1 tablet (75 mg total) by mouth 2 (two) times daily as needed.    Dispense:  60 tablet    Refill:  0    Chronic pain syndrome    Avoid NSAIDs Okay to supplement with acetaminophen 650 to 1000 mg every 8 hours as needed.    Follow-up plan:   Return in about 3 months (around 10/12/2021) for Medication Management, in person.   Recent Visits No visits were found meeting these conditions. Showing recent visits within past 90 days and meeting all other requirements Today's Visits Date Type Provider Dept  07/15/21 Office Visit [name], MD [facility]  Showing today's visits and meeting all other requirements Future Appointments No visits were found meeting these conditions. Showing future appointments within next 90 days and meeting all other requirements  I discussed the assessment and treatment plan with the patient. The patient was provided an opportunity to ask questions and all were answered. The patient agreed with the plan and demonstrated an understanding of the instructions.  Patient advised to call back or seek an in-person evaluation if the symptoms or condition  worsens.  Duration of encounter: 30 minutes.  Note by: [name], MD Date: 07/15/2021; Time: 8:30 AM

## 2021-07-15 NOTE — Progress Notes (Signed)
Nursing Pain Medication Assessment:  Safety precautions to be maintained throughout the outpatient stay will include: orient to surroundings, keep bed in low position, maintain call bell within reach at all times, provide assistance with transfer out of bed and ambulation.  Medication Inspection Compliance: Pill count conducted under aseptic conditions, in front of the patient. Neither the pills nor the bottle was removed from the patient's sight at any time. Once count was completed pills were immediately returned to the patient in their original bottle.  Medication: Tapentadol (Nucynta) Pill/Patch Count:  2 of 45 pills remain Pill/Patch Appearance: Markings consistent with prescribed medication Bottle Appearance: Standard pharmacy container. Clearly labeled. Filled Date: 01 / 22 / 2023 Last Medication intake:  Yesterday

## 2021-07-17 ENCOUNTER — Encounter: Payer: Self-pay | Admitting: Gastroenterology

## 2021-07-17 ENCOUNTER — Other Ambulatory Visit: Payer: Self-pay | Admitting: Gastroenterology

## 2021-07-17 MED ORDER — DICYCLOMINE HCL 10 MG PO CAPS: 20.0000 mg | ORAL_CAPSULE | Freq: Four times a day (QID) | ORAL | 1 refills | Status: DC | PRN

## 2021-08-04 DIAGNOSIS — C44509 Unspecified malignant neoplasm of skin of other part of trunk: Secondary | ICD-10-CM | POA: Diagnosis not present

## 2021-08-07 DIAGNOSIS — R933 Abnormal findings on diagnostic imaging of other parts of digestive tract: Secondary | ICD-10-CM | POA: Insufficient documentation
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
## 2021-08-08 ENCOUNTER — Encounter: Payer: Self-pay | Admitting: Family Medicine

## 2021-08-08 ENCOUNTER — Ambulatory Visit: Payer: Federal, State, Local not specified - PPO | Admitting: Family Medicine

## 2021-08-08 ENCOUNTER — Other Ambulatory Visit: Payer: Self-pay

## 2021-08-08 VITALS — BP 116/80 | HR 98 | Temp 98.2°F | Resp 16 | Ht 65.0 in | Wt 164.7 lb

## 2021-08-08 DIAGNOSIS — I10 Essential (primary) hypertension: Secondary | ICD-10-CM | POA: Diagnosis not present

## 2021-08-08 DIAGNOSIS — E8881 Metabolic syndrome: Secondary | ICD-10-CM

## 2021-08-08 DIAGNOSIS — E559 Vitamin D deficiency, unspecified: Secondary | ICD-10-CM

## 2021-08-08 DIAGNOSIS — G8929 Other chronic pain: Secondary | ICD-10-CM

## 2021-08-08 DIAGNOSIS — F339 Major depressive disorder, recurrent, unspecified: Secondary | ICD-10-CM | POA: Diagnosis not present

## 2021-08-08 DIAGNOSIS — C44529 Squamous cell carcinoma of skin of other part of trunk: Secondary | ICD-10-CM

## 2021-08-08 DIAGNOSIS — K529 Noninfective gastroenteritis and colitis, unspecified: Secondary | ICD-10-CM

## 2021-08-08 DIAGNOSIS — R779 Abnormality of plasma protein, unspecified: Secondary | ICD-10-CM

## 2021-08-08 DIAGNOSIS — E538 Deficiency of other specified B group vitamins: Secondary | ICD-10-CM

## 2021-08-08 DIAGNOSIS — R102 Pelvic and perineal pain: Secondary | ICD-10-CM

## 2021-08-08 DIAGNOSIS — G4700 Insomnia, unspecified: Secondary | ICD-10-CM

## 2021-08-08 DIAGNOSIS — R1084 Generalized abdominal pain: Secondary | ICD-10-CM

## 2021-08-10 ENCOUNTER — Other Ambulatory Visit: Payer: Self-pay | Admitting: General Surgery

## 2021-08-10 DIAGNOSIS — D049 Carcinoma in situ of skin, unspecified: Secondary | ICD-10-CM | POA: Insufficient documentation

## 2021-08-13 LAB — COMPLETE METABOLIC PANEL WITH GFR
AG Ratio: 2 (calc) (ref 1.0–2.5)
ALT: 8 U/L (ref 6–29)
AST: 13 U/L (ref 10–35)
Albumin: 4.3 g/dL (ref 3.6–5.1)
Alkaline phosphatase (APISO): 50 U/L (ref 31–125)
BUN: 7 mg/dL (ref 7–25)
CO2: 29 mmol/L (ref 20–32)
Calcium: 10.3 mg/dL — ABNORMAL HIGH (ref 8.6–10.2)
Chloride: 104 mmol/L (ref 98–110)
Creat: 0.77 mg/dL (ref 0.50–0.99)
Globulin: 2.2 g/dL (calc) (ref 1.9–3.7)
Glucose, Bld: 87 mg/dL (ref 65–99)
Potassium: 4 mmol/L (ref 3.5–5.3)
Sodium: 140 mmol/L (ref 135–146)
Total Bilirubin: 0.7 mg/dL (ref 0.2–1.2)
Total Protein: 6.5 g/dL (ref 6.1–8.1)
eGFR: 96 mL/min/{1.73_m2} (ref >=60)

## 2021-08-13 LAB — CBC WITH DIFFERENTIAL/PLATELET
Absolute Monocytes: 263 cells/uL (ref 200–950)
Basophils Absolute: 39 cells/uL (ref 0–200)
Basophils Relative: 0.7 %
Eosinophils Absolute: 101 cells/uL (ref 15–500)
Eosinophils Relative: 1.8 %
HCT: 42 % (ref 35.0–45.0)
Hemoglobin: 14 g/dL (ref 11.7–15.5)
Lymphs Abs: 2537 cells/uL (ref 850–3900)
MCH: 28.8 pg (ref 27.0–33.0)
MCHC: 33.3 g/dL (ref 32.0–36.0)
MCV: 86.4 fL (ref 80.0–100.0)
MPV: 10 fL (ref 7.5–12.5)
Monocytes Relative: 4.7 %
Neutro Abs: 2660 cells/uL (ref 1500–7800)
Neutrophils Relative %: 47.5 %
Platelets: 297 10*3/uL (ref 140–400)
RBC: 4.86 10*6/uL (ref 3.80–5.10)
RDW: 13.1 % (ref 11.0–15.0)
Total Lymphocyte: 45.3 %
WBC: 5.6 10*3/uL (ref 3.8–10.8)

## 2021-08-13 LAB — PROTEIN ELECTROPHORESIS, SERUM
Albumin ELP: 4.4 g/dL (ref 3.8–4.8)
Alpha 1: 0.3 g/dL (ref 0.2–0.3)
Alpha 2: 0.6 g/dL (ref 0.5–0.9)
Beta 2: 0.3 g/dL (ref 0.2–0.5)
Beta Globulin: 0.4 g/dL (ref 0.4–0.6)
Gamma Globulin: 0.9 g/dL (ref 0.8–1.7)
Total Protein: 6.8 g/dL (ref 6.1–8.1)

## 2021-08-13 LAB — VITAMIN B12: Vitamin B-12: 416 pg/mL (ref 200–1100)

## 2021-08-13 LAB — PARATHYROID HORMONE, INTACT (NO CA): PTH: 60 pg/mL (ref 16–77)

## 2021-08-13 LAB — VITAMIN D 25 HYDROXY (VIT D DEFICIENCY, FRACTURES): Vit D, 25-Hydroxy: 37 ng/mL (ref 30–100)

## 2021-08-14 ENCOUNTER — Other Ambulatory Visit: Payer: Self-pay | Admitting: Family Medicine

## 2021-08-14 ENCOUNTER — Encounter: Payer: Self-pay | Admitting: Gastroenterology

## 2021-08-14 ENCOUNTER — Encounter: Payer: Self-pay | Admitting: Family Medicine

## 2021-08-15 DIAGNOSIS — F17210 Nicotine dependence, cigarettes, uncomplicated: Secondary | ICD-10-CM | POA: Diagnosis not present

## 2021-08-15 DIAGNOSIS — Z9049 Acquired absence of other specified parts of digestive tract: Secondary | ICD-10-CM | POA: Diagnosis not present

## 2021-08-15 DIAGNOSIS — F32A Depression, unspecified: Secondary | ICD-10-CM | POA: Diagnosis not present

## 2021-08-15 DIAGNOSIS — Z7982 Long term (current) use of aspirin: Secondary | ICD-10-CM | POA: Diagnosis not present

## 2021-08-15 DIAGNOSIS — Z79899 Other long term (current) drug therapy: Secondary | ICD-10-CM | POA: Diagnosis not present

## 2021-08-15 DIAGNOSIS — Z7984 Long term (current) use of oral hypoglycemic drugs: Secondary | ICD-10-CM | POA: Diagnosis not present

## 2021-08-15 DIAGNOSIS — Z90710 Acquired absence of both cervix and uterus: Secondary | ICD-10-CM | POA: Diagnosis not present

## 2021-08-15 DIAGNOSIS — K6282 Dysplasia of anus: Secondary | ICD-10-CM | POA: Diagnosis not present

## 2021-08-15 DIAGNOSIS — C44599 Other specified malignant neoplasm of skin of other part of trunk: Secondary | ICD-10-CM | POA: Diagnosis not present

## 2021-08-15 DIAGNOSIS — Z9889 Other specified postprocedural states: Secondary | ICD-10-CM | POA: Diagnosis not present

## 2021-08-15 DIAGNOSIS — C44509 Unspecified malignant neoplasm of skin of other part of trunk: Secondary | ICD-10-CM | POA: Diagnosis not present

## 2021-08-15 DIAGNOSIS — I10 Essential (primary) hypertension: Secondary | ICD-10-CM | POA: Diagnosis not present

## 2021-08-15 HISTORY — PX: RECTAL SURGERY: SHX760

## 2021-08-15 MED ORDER — PROMETHAZINE HCL 25 MG PO TABS: 25.0000 mg | ORAL_TABLET | Freq: Three times a day (TID) | ORAL | 0 refills | Status: DC | PRN

## 2021-08-18 ENCOUNTER — Encounter: Payer: Self-pay | Admitting: Gastroenterology

## 2021-09-08 ENCOUNTER — Encounter: Payer: Self-pay | Admitting: Family Medicine

## 2021-09-30 ENCOUNTER — Other Ambulatory Visit: Payer: Self-pay

## 2021-10-01 ENCOUNTER — Ambulatory Visit: Payer: Federal, State, Local not specified - PPO | Admitting: Gastroenterology

## 2021-10-01 ENCOUNTER — Encounter: Payer: Self-pay | Admitting: Gastroenterology

## 2021-10-01 VITALS — BP 138/86 | HR 84 | Temp 98.8°F | Ht 65.0 in | Wt 170.4 lb

## 2021-10-01 DIAGNOSIS — R1013 Epigastric pain: Secondary | ICD-10-CM | POA: Diagnosis not present

## 2021-10-01 DIAGNOSIS — R195 Other fecal abnormalities: Secondary | ICD-10-CM

## 2021-10-01 NOTE — Patient Instructions (Addendum)
Your gastric emptying study is schedule for  10/10/2021 at 9:30am at [facility]. Please arrive at 9:00am No stomach medications after midnight (amitriptyline, Zofran, Promethazine, Cholestyramine, Dicyclomine, omeprazole) Nothing to eat or drink 4 hours before the scan.  ?

## 2021-10-01 NOTE — Progress Notes (Signed)
?  ?Cephas Darby, MD ?[address]  ?[address]  ?[place], [place] [address]  ?Main: [phone]  ?Fax: [phone] ? ? ? ?Gastroenterology Consultation ? ?Referring Provider:     Steele Sizer, MD ?Primary Care Physician:  Steele Sizer, MD ?Primary Gastroenterologist:  Dr. Cephas Darby ?Reason for Consultation:     Chronic diarrhea, abdominal discomfort ?      ? HPI:   ?Stacey Stanley is a 46 y.o. female referred by Dr. Steele Sizer, MD  for consultation & management of chronic diarrhea.  Patient reports that since 07/2020, patient has been experiencing several bouts of watery diarrhea as well as epigastric/right upper quadrant pain associated with nausea.  Patient has been diagnosed with symptomatic cholelithiasis, underwent laparoscopic cholecystectomy by Dr. Celine Ahr on 12/13/2020.  Patient reports that her upper abdominal discomfort has not completely resolved since cholecystectomy.  She has been suffering from alternating diarrhea and constipation.  Diarrhea has been her main concern which has worsened since cholecystectomy.  Patient was started on cholestyramine 3 times a day which led to severe constipation.  She also has issues with rectal sphincter since she had hemorrhoidectomy last year.  Patient reports that she has significant amount of stress in her professional life.  She is a Marine scientist at the [employer].  She states that her sleep is not adequate.  Patient underwent CT abdomen and pelvis with no acute intra-abdominal pathology.  Patient does have history of chronic pelvic pain from endometriosis, had history of laparoscopic hysterectomy and laparoscopic resection of endometriosis.  Patient's weight has been stable.  Patient tried Linzess and Trulance for constipation which did not help.  Her labs including CBC, CMP, TSH are unremarkable. ?Patient does not smoke or drink alcohol ?Patient is accompanied by her husband today ? ?Follow-up visit 10/01/2021 ?Patient reports that for last 1  week she has been experiencing epigastric pain, worse postprandial and during eating associated with nausea but no vomiting.  She also reports that her stools are pale brown and when she sometimes.  She took cholestyramine yesterday which made her feel somewhat better.  She has these intermittent flareup of the symptoms.  She was feeling well until a week ago and was able to gain some weight, almost 10 pounds.  She is on chronic opioid use and smokes tobacco.  She underwent upper endoscopy and colonoscopy including biopsies which were all unremarkable. ? ?NSAIDs: None ? ?Antiplts/Anticoagulants/Anti thrombotics: None ? ?GI Procedures:  ?Colonoscopy in 03/02/2018 with poor prep ? ?EGD and colonoscopy ?- Normal duodenal bulb and second portion of the duodenum. ?- Erosive gastropathy with stigmata of recent bleeding. Biopsied. ?- Normal gastric body. Biopsied. ?- Normal gastroesophageal junction and esophagus. ? ?- Preparation of the colon was fair. ?- The examined portion of the ileum was normal. ?- Four 4 to 5 mm polyps in the rectum, removed with a cold snare. Resected and retrieved. ?- Normal mucosa in the entire examined colon. Biopsied. ? ?DIAGNOSIS:  ?A.  STOMACH; COLD BIOPSY:  ?- MILD REACTIVE GASTROPATHY.  ?- NEGATIVE FOR ACTIVE INFLAMMATION, H. PYLORI, INTESTINAL METAPLASIA,  ?DYSPLASIA, AND MALIGNANCY.  ? ?B.  COLON; RANDOM COLD BIOPSY:  ?- COLONIC MUCOSA WITH INTACT CRYPT ARCHITECTURE.  ?- NEGATIVE FOR MICROSCOPIC COLITIS, DYSPLASIA, AND MALIGNANCY.  ? ?C.  RECTUM POLYP X 4; COLD SNARE:  ?- HYPERPLASTIC POLYPS, 5 FRAGMENTS.  ?- NEGATIVE FOR DYSPLASIA AND MALIGNANCY.  ? ?Past Medical History:  ?Diagnosis Date  ? Anxiety   ? no current meds  ?  Arthritis   ? left knee pain - otc med prn  ? Chronic insomnia   ? Depression   ? Diabetes mellitus without complication ([place])   ? Dysrhythmia   ? Home with daughter [name]  ? Endometriosis   ? GERD (gastroesophageal reflux disease)   ? diet control - no meds  ?  Gout   ? wrist, feet, hands bilateral  ? Headache(784.0)   ? hx - last one 2 yrs ago - no meds  ? History of kidney stones   ? Hypertension   ? Metabolic syndrome   ? Obesity   ? Right thyroid nodule 2017  ? benign  ? Serum calcium elevated   ? Sleep disorder, circadian, shift work type   ? SVD (spontaneous vaginal delivery)   ? x 2  ? Tachycardia   ? History - neg cardiac tests-stress related  ? ? ?Past Surgical History:  ?Procedure Laterality Date  ? ABDOMINAL HYSTERECTOMY  05/21/2016  ? COLONOSCOPY  2019  ? COLONOSCOPY WITH PROPOFOL N/A 07/04/2021  ? Procedure: COLONOSCOPY WITH PROPOFOL;  Surgeon: Lin Landsman, MD;  Location[facility] ENDOSCOPY;  Service: Gastroenterology;  Laterality: N/A;  ? DILATION AND CURETTAGE OF UTERUS    ? ESOPHAGOGASTRODUODENOSCOPY (EGD) WITH PROPOFOL N/A 07/04/2021  ? Procedure: ESOPHAGOGASTRODUODENOSCOPY (EGD) WITH PROPOFOL;  Surgeon: Lin Landsman, MD;  Location[facility] ENDOSCOPY;  Service: Gastroenterology;  Laterality: N/A;  ? EYE SURGERY  06/28/2013  ? right laser eye surgery - repair retina  ? HEMORROIDECTOMY  04/04/2018  ? Dr. Levin Bacon   ? HYSTEROSCOPY WITH D & C N/A 07/19/2013  ? Procedure: DILATATION AND CURETTAGE /HYSTEROSCOPY;  Surgeon: Marvene Staff, MD;  Location: [facility];  Service: Gynecology;  Laterality: N/A;  ? INDUCED ABORTION    ? LAPAROSCOPY  1992  ? endometriosis  ? LAPAROSCOPY N/A 07/19/2013  ? Procedure: LAPAROSCOPY DIAGNOSTIC  with resection of endometriosis;  Surgeon: Marvene Staff, MD;  Location: [facility];  Service: Gynecology;  Laterality: N/A;  ? metatarsil   2003  ? left foot surgery   ? ?Current Outpatient Medications:  ?  aspirin EC 81 MG tablet, Take 81 mg by mouth daily. Swallow whole., Disp: , Rfl:  ?  cholestyramine (QUESTRAN) 4 g packet, as directed orally 2 times a day for 30 day(s), Disp: , Rfl:  ?  Dapsone 5 % topical gel, Apply 60 g topically daily., Disp: , Rfl:  ?  hydrochlorothiazide (HYDRODIURIL) 12.5 MG tablet, Take 1 tablet  (12.5 mg total) by mouth daily., Disp: 90 tablet, Rfl: 1 ?  imiquimod (ALDARA) 5 % cream, Apply 1 packet topically daily as needed (warts)., Disp: , Rfl:  ?  Omega-3 Fatty Acids (FISH OIL) 1000 MG CAPS, Take 2,000 mg by mouth daily., Disp: , Rfl:  ?  ondansetron (ZOFRAN) 4 MG tablet, 1 tab(s) orally 3 times a day for 30 days, Disp: , Rfl:  ?  promethazine (PHENERGAN) 25 MG tablet, Take 1 tablet (25 mg total) by mouth every 8 (eight) hours as needed for nausea or vomiting., Disp: 14 tablet, Rfl: 0 ?  tapentadol HCl (NUCYNTA) 75 MG tablet, Take 1 tablet (75 mg total) by mouth 2 (two) times daily as needed., Disp: 60 tablet, Rfl: 0 ?  TRI-LUMA 0.01-4-0.05 % CREA, Apply 30 mg topically daily., Disp: , Rfl:  ?  TRULANCE 3 MG TABS, Take 1 tablet by mouth daily., Disp: , Rfl:  ?  omeprazole (PRILOSEC) 40 MG capsule, Take 1 capsule (40 mg total)  by mouth 2 (two) times daily before a meal., Disp: 60 capsule, Rfl: 2 ? ? ? ?Family History  ?Problem Relation Age of Onset  ? Diabetes Mother   ? Hypertension Mother   ? Hypertension Father   ? Kidney disease Father   ? Stroke Maternal Grandmother   ? Leukemia Paternal Grandmother   ? Alzheimer's disease Paternal Grandfather   ? Heart disease Brother   ? Anesthesia problems Neg Hx   ? Hypotension Neg Hx   ? Malignant hyperthermia Neg Hx   ? Pseudochol deficiency Neg Hx   ?  ? ?Social History  ? ?Tobacco Use  ? Smoking status: Former  ?  Packs/day: 0.50  ?  Years: 25.00  ?  Pack years: 12.50  ?  Types: Cigarettes  ?  Start date: 11/25/1993  ? Smokeless tobacco: Never  ?Vaping Use  ? Vaping Use: Former  ? Start date: 06/01/2014  ? Quit date: 06/02/2015  ?Substance Use Topics  ? Alcohol use: No  ?  Alcohol/week: 0.0 standard drinks  ? Drug use: No  ? ? ?Allergies as of 10/01/2021 - Review Complete 10/01/2021  ?Allergen Reaction Noted  ? Nuvigil [armodafinil]  07/27/2017  ? Ppd [tuberculin purified protein derivative]  12/13/2020  ? ? ?Review of Systems:    ?All systems reviewed and  negative except where noted in HPI. ? ? Physical Exam:  ?BP 138/86 (BP Location: Left Arm, Patient Position: Sitting, Cuff Size: Normal)   Pulse 84   Temp 98.8 ?F (37.1 ?C) (Oral)   Ht '5\' 5"'$  (1.651 m)   Wt 170 l

## 2021-10-09 ENCOUNTER — Encounter: Payer: Self-pay | Admitting: Student in an Organized Health Care Education/Training Program

## 2021-10-09 ENCOUNTER — Ambulatory Visit
Payer: Federal, State, Local not specified - PPO | Attending: Gastroenterology | Admitting: Student in an Organized Health Care Education/Training Program

## 2021-10-09 VITALS — BP 114/88 | HR 84 | Temp 97.6°F | Resp 16 | Ht 65.0 in | Wt 170.0 lb

## 2021-10-09 DIAGNOSIS — N809 Endometriosis, unspecified: Secondary | ICD-10-CM

## 2021-10-09 DIAGNOSIS — Z79899 Other long term (current) drug therapy: Secondary | ICD-10-CM

## 2021-10-09 DIAGNOSIS — G894 Chronic pain syndrome: Secondary | ICD-10-CM | POA: Diagnosis not present

## 2021-10-09 DIAGNOSIS — R102 Pelvic and perineal pain: Secondary | ICD-10-CM

## 2021-10-09 DIAGNOSIS — G4700 Insomnia, unspecified: Secondary | ICD-10-CM | POA: Diagnosis not present

## 2021-10-09 DIAGNOSIS — G8929 Other chronic pain: Secondary | ICD-10-CM

## 2021-10-09 MED ORDER — TAPENTADOL HCL 75 MG PO TABS: 75.0000 mg | ORAL_TABLET | Freq: Two times a day (BID) | ORAL | 0 refills | Status: DC | PRN

## 2021-10-09 MED ORDER — TAPENTADOL HCL 75 MG PO TABS
75.0000 mg | ORAL_TABLET | Freq: Two times a day (BID) | ORAL | 0 refills | Status: DC | PRN
Start: 2021-12-21 — End: 2022-01-08

## 2021-10-09 MED ORDER — TAPENTADOL HCL 75 MG PO TABS: 75.0000 mg | ORAL_TABLET | Freq: Two times a day (BID) | ORAL | 0 refills | Status: AC | PRN

## 2021-10-09 NOTE — Progress Notes (Signed)
Nursing Pain Medication Assessment:  ?Safety precautions to be maintained throughout the outpatient stay will include: orient to surroundings, keep bed in low position, maintain call bell within reach at all times, provide assistance with transfer out of bed and ambulation.  ?Medication Inspection Compliance: Pill count conducted under aseptic conditions, in front of the patient. Neither the pills nor the bottle was removed from the patient's sight at any time. Once count was completed pills were immediately returned to the patient in their original bottle. ? ?Medication: Tapentadol (Nucynta) ?Pill/Patch Count:  18 of 60 pills remain ?Pill/Patch Appearance: Markings consistent with prescribed medication ?Bottle Appearance: Standard pharmacy container. Clearly labeled. ?Filled Date: 04 / 23 / 2023 ?Last Medication intake:  Yesterday ?

## 2021-10-09 NOTE — Progress Notes (Signed)
PROVIDER NOTE: Information contained herein reflects review and annotations entered in association with encounter. Interpretation of such information and data should be left to medically-trained personnel. Information provided to patient can be located elsewhere in the medical record under "Patient Instructions". Document created using STT-dictation technology, any transcriptional errors that may result from process are unintentional.  ?  ?Patient: Stacey Stanley  Service Category: E/M  Provider: [name], MD  ?DOB: 1975-06-30  DOS: 10/09/2021  Specialty: Interventional Pain Management  ?MRN: [id number]  Setting: Ambulatory outpatient  PCP: Steele Sizer, MD  ?Type: Established Patient    Referring Provider: Steele Sizer, MD  ?Location: Office  Delivery: Face-to-face    ? ?HPI  ?Stacey Stanley, a 46 y.o. year old female, is here today because of her Chronic pelvic pain in female [R10.2, G89.29]. Stacey Stanley primary complain today is Pain (Rectum, pelvic) ? ?Last encounter: My last encounter with her was on 09/01/2021 ?Pertinent problems: Stacey Stanley has Endometriosis; Chronic pelvic pain in female; Cervical radiculitis; Chronic pain syndrome; and Opiate use on their pertinent problem list. ?Pain Assessment: Severity of Chronic pain is reported as a 6 /10. Location: Other (Comment) (rectal, pelvic)  / . Onset: More than a month ago. Quality:  (rectal pain-pins and needles). Timing: Constant. Modifying factor(s):  . ?Vitals:  height is '5\' 5"'$  (1.651 m) and weight is 170 lb (77.1 kg). Her temporal temperature is 97.6 ?F (36.4 ?C). Her blood pressure is 114/88 and her pulse is 84. Her respiration is 16 and oxygen saturation is 100%.  ? ?Reason for encounter: medication management.   ? 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  She is still experiencing pain and drainage.  She is having some wound dehiscence which is painful.   She has an upcoming appointment on Monday.  She is dealing with acute postoperative pain on top of her chronic pelvic pain.  She continues to take Nucynta for pain management.  UDS up-to-date and appropriate.  We will refill Nucynta as below.  Advised against NSAIDs.  Recommend patient to supplement with acetaminophen 500 mg every 6 hours as needed.  She is also utilizing a doughnut to relieve pressure from her sacral area.  Encouraged her to follow back up with general surgery to discuss wound dehiscence as this could be a infection risk. ? ?Pharmacotherapy Assessment  ? ?Analgesic: Nucynta 75 mg twice daily as needed, quantity 60/month; ? ?Monitoring: ?[place] PMP: PDMP reviewed during this encounter.       ?Pharmacotherapy: No side-effects or adverse reactions reported. ?Compliance: No problems identified. ?Effectiveness: Clinically acceptable. ? ?Rise Patience, RN  10/09/2021  8:27 AM  Sign when Signing Visit ?Nursing Pain Medication Assessment:  ?Safety precautions to be maintained throughout the outpatient stay will include: orient to surroundings, keep bed in low position, maintain call bell within reach at all times, provide assistance with transfer out of bed and ambulation.  ?Medication Inspection Compliance: Pill count conducted under aseptic conditions, in front of the patient. Neither the pills nor the bottle was removed from the patient's sight at any time. Once count was completed pills were immediately returned to the patient in their original bottle. ? ?Medication: Tapentadol (Nucynta) ?Pill/Patch Count:  18 of 60 pills remain ?Pill/Patch Appearance: Markings consistent with prescribed medication ?Bottle Appearance: Standard pharmacy container. Clearly labeled. ?Filled Date: 04 / 23 / 2023 ?Last Medication intake:  Yesterday ?  UDS:  ?Summary  ?  Date Value Ref Range Status  ?12/31/2020 Note  Final  ?  Comment:  ?  ==================================================================== ?[employer] Select 13  (MW) ?==================================================================== ?Test                             Result       Flag       Units ? ?Drug Present and Declared for Prescription Verification ?  Tapentadol                     >3390        EXPECTED   ng/mg creat ?   Source of tapentadol is a scheduled prescription medication. ? ?==================================================================== ?Test                      Result    Flag   Units      Ref Range ?  Creatinine              295              mg/dL      >=20 ?==================================================================== ?Declared Medications: ? The flagging and interpretation on this report are based on the ? following declared medications.  Unexpected results may arise from ? inaccuracies in the declared medications. ? ? **Note: The testing scope of this panel includes these medications: ? ? Tapentadol (Nucynta) ? ? **Note: The testing scope of this panel does not include the ? following reported medications: ? ? Dapsone ? Fish Oil ? Hydrochlorothiazide (Hydrodiuril) ? Ibuprofen (Advil) ? Imiquimod (Aldara) ? Metformin (Glucophage) ? Nicotine ? Promethazine (Phenergan) ? Semaglutide (Ozempic) ? Topical ? Vitamin C ?==================================================================== ?For clinical consultation, please call [phone]. ?==================================================================== ?  ?  ? ?ROS  ?Constitutional: Denies any fever or chills ?Gastrointestinal: Intermittent abdominal pain, diarrhea ?Musculoskeletal: Denies any acute onset joint swelling, redness, loss of ROM, or weakness ?Neurological: No reported episodes of acute onset apraxia, aphasia, dysarthria, agnosia, amnesia, paralysis, loss of coordination, or loss of consciousness ? ?Medication Review  ?Dapsone, Fish Oil, Fluocin-Hydroquinone-Tretinoin, Plecanatide, aspirin EC, cholestyramine, hydrochlorothiazide, imiquimod, omeprazole, ondansetron,  promethazine, and tapentadol HCl ? ?History Review  ?Allergy: Stacey Stanley is allergic to nuvigil [armodafinil] and ppd [tuberculin purified protein derivative]. ?Drug: Stacey Stanley  reports no history of drug use. ?Alcohol:  reports no history of alcohol use. ?Tobacco:  reports that she has quit smoking. Her smoking use included cigarettes. She started smoking about 27 years ago. She has a 12.50 pack-year smoking history. She has never used smokeless tobacco. ?Social: Stacey Stanley  reports that she has quit smoking. Her smoking use included cigarettes. She started smoking about 27 years ago. She has a 12.50 pack-year smoking history. She has never used smokeless tobacco. She reports that she does not drink alcohol and does not use drugs. ?Medical:  has a past medical history of Anxiety, Arthritis, Chronic insomnia, Depression, Diabetes mellitus without complication ([place]), Dysrhythmia, Endometriosis, GERD (gastroesophageal reflux disease), Gout, Headache(784.0), History of kidney stones, Hypertension, Metabolic syndrome, Obesity, Right thyroid nodule (2017), Serum calcium elevated, Sleep disorder, circadian, shift work type, SVD (spontaneous vaginal delivery), and Tachycardia. ?Surgical: Stacey Stanley  has a past surgical history that includes metatarsil  (2003); Dilation and curettage of uterus; Induced abortion; laparoscopy (1992); Eye surgery (06/28/2013); laparoscopy (N/A, 07/19/2013); Hysteroscopy with D & C (N/A, 07/19/2013); Abdominal hysterectomy (05/21/2016); Hemorroidectomy (04/04/2018); Colonoscopy (2019); Colonoscopy with propofol (N/A, 07/04/2021); Esophagogastroduodenoscopy (egd) with propofol (N/A, 07/04/2021); and  Rectal surgery (08/15/2021). ?Family: family history includes Alzheimer's disease in her paternal grandfather; Diabetes in her mother; Heart disease in her brother; Hypertension in her father and mother; Kidney disease in her father; Leukemia in her paternal grandmother; Stroke in her maternal  grandmother. ? ?Laboratory Chemistry Profile  ? ?Renal ?Lab Results  ?Component Value Date  ? BUN 7 08/08/2021  ? CREATININE 0.77 08/08/2021  ? BCR NOT APPLICABLE 81/38/8719  ? GFRAA 108 11/28/2020  ? GFRNONAA >60

## 2021-10-10 ENCOUNTER — Ambulatory Visit
Admission: RE | Admit: 2021-10-10 | Disposition: A | Discharge status: Home / Self Care | Payer: Federal, State, Local not specified - PPO | Source: Ambulatory Visit

## 2021-10-10 ENCOUNTER — Telehealth: Payer: Self-pay

## 2021-10-10 DIAGNOSIS — G8929 Other chronic pain: Secondary | ICD-10-CM | POA: Insufficient documentation

## 2021-10-10 DIAGNOSIS — G894 Chronic pain syndrome: Secondary | ICD-10-CM | POA: Insufficient documentation

## 2021-10-10 DIAGNOSIS — G4700 Insomnia, unspecified: Secondary | ICD-10-CM | POA: Insufficient documentation

## 2021-10-10 DIAGNOSIS — Z79899 Other long term (current) drug therapy: Secondary | ICD-10-CM | POA: Insufficient documentation

## 2021-10-10 DIAGNOSIS — R102 Pelvic and perineal pain: Secondary | ICD-10-CM | POA: Insufficient documentation

## 2021-10-10 DIAGNOSIS — N809 Endometriosis, unspecified: Secondary | ICD-10-CM | POA: Insufficient documentation

## 2021-10-10 NOTE — Telephone Encounter (Signed)
Good morning, I just wanted to let you know this patient did not show up for the gastric emptying exam today.  ?Sent mychart message  ?
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
## 2021-11-13 ENCOUNTER — Encounter: Payer: Self-pay | Admitting: Family Medicine

## 2021-11-13 ENCOUNTER — Ambulatory Visit: Payer: Federal, State, Local not specified - PPO | Admitting: Family Medicine

## 2021-11-13 ENCOUNTER — Telehealth: Payer: Self-pay | Admitting: Student in an Organized Health Care Education/Training Program

## 2021-11-13 VITALS — BP 122/82 | HR 84 | Temp 98.4°F | Resp 16 | Ht 65.0 in | Wt 177.0 lb

## 2021-11-13 DIAGNOSIS — G894 Chronic pain syndrome: Secondary | ICD-10-CM

## 2021-11-13 DIAGNOSIS — G4700 Insomnia, unspecified: Secondary | ICD-10-CM

## 2021-11-13 DIAGNOSIS — R7303 Prediabetes: Secondary | ICD-10-CM

## 2021-11-13 DIAGNOSIS — I10 Essential (primary) hypertension: Secondary | ICD-10-CM

## 2021-11-13 DIAGNOSIS — E559 Vitamin D deficiency, unspecified: Secondary | ICD-10-CM

## 2021-11-13 DIAGNOSIS — C44529 Squamous cell carcinoma of skin of other part of trunk: Secondary | ICD-10-CM | POA: Diagnosis not present

## 2021-11-13 DIAGNOSIS — Z0100 Encounter for examination of eyes and vision without abnormal findings: Secondary | ICD-10-CM | POA: Diagnosis not present

## 2021-11-13 DIAGNOSIS — Z111 Encounter for screening for respiratory tuberculosis: Secondary | ICD-10-CM

## 2021-11-13 DIAGNOSIS — Z0110 Encounter for examination of ears and hearing without abnormal findings: Secondary | ICD-10-CM

## 2021-11-13 DIAGNOSIS — F339 Major depressive disorder, recurrent, unspecified: Secondary | ICD-10-CM | POA: Diagnosis not present

## 2021-11-13 DIAGNOSIS — E538 Deficiency of other specified B group vitamins: Secondary | ICD-10-CM

## 2021-11-13 MED ORDER — HYDROXYZINE HCL 10 MG PO TABS: 10.0000 mg | ORAL_TABLET | Freq: Every evening | ORAL | 0 refills | Status: DC

## 2021-11-13 MED ORDER — OZEMPIC (0.25 OR 0.5 MG/DOSE) 2 MG/3ML ~~LOC~~ SOPN: 0.2500 mg | PEN_INJECTOR | SUBCUTANEOUS | 0 refills | Status: DC

## 2021-11-13 MED ORDER — HYDROCHLOROTHIAZIDE 12.5 MG PO TABS: 12.5000 mg | ORAL_TABLET | Freq: Every day | ORAL | 1 refills | Status: DC

## 2021-11-13 NOTE — Assessment & Plan Note (Signed)
Under the care of the pain clinic

## 2021-11-13 NOTE — Assessment & Plan Note (Signed)
Continue supplementation  ?

## 2021-11-13 NOTE — Assessment & Plan Note (Signed)
Mild and likely from HCTZ

## 2021-11-13 NOTE — Telephone Encounter (Signed)
Stacey Stanley brought in new FMLA forms to be filled out. I made a copy to keep up front and gave original to Nurses station.

## 2021-11-13 NOTE — Assessment & Plan Note (Signed)
She is doing better now

## 2021-11-13 NOTE — Assessment & Plan Note (Signed)
At goal.  

## 2021-11-14 ENCOUNTER — Telehealth: Payer: Self-pay

## 2021-11-14 NOTE — Telephone Encounter (Signed)
Forms have been filled out partially by patient. The information is different than the previous forms. Will get Dr Holley Raring to approve and sign when he returns to office.

## 2021-11-17 ENCOUNTER — Telehealth: Payer: Self-pay

## 2021-11-17 NOTE — Telephone Encounter (Signed)
Papers have been received.  Need to discuss with Dr Holley Raring.

## 2021-11-19 LAB — QUANTIFERON-TB GOLD PLUS
Mitogen-NIL: >10 IU/mL
NIL: 0.08 IU/mL
QuantiFERON-TB Gold Plus: NEGATIVE
TB1-NIL: 0.29 IU/mL
TB2-NIL: 0.29 IU/mL

## 2021-11-21 ENCOUNTER — Telehealth: Payer: Self-pay

## 2021-11-21 NOTE — Telephone Encounter (Signed)
Attempted to call patient to see what medication she is referring too. No answer. Left message to call office back.

## 2021-11-21 NOTE — Telephone Encounter (Signed)
She found her medicine at [facility] on [address] in [place]. Can her scripts be called out there.

## 2021-11-24 ENCOUNTER — Other Ambulatory Visit: Payer: Self-pay | Admitting: *Deleted

## 2021-11-24 ENCOUNTER — Telehealth: Payer: Self-pay

## 2021-11-24 DIAGNOSIS — L821 Other seborrheic keratosis: Secondary | ICD-10-CM | POA: Diagnosis not present

## 2021-11-24 DIAGNOSIS — D225 Melanocytic nevi of trunk: Secondary | ICD-10-CM | POA: Diagnosis not present

## 2021-11-24 DIAGNOSIS — L853 Xerosis cutis: Secondary | ICD-10-CM | POA: Diagnosis not present

## 2021-11-24 DIAGNOSIS — L814 Other melanin hyperpigmentation: Secondary | ICD-10-CM | POA: Diagnosis not present

## 2021-11-24 DIAGNOSIS — G894 Chronic pain syndrome: Secondary | ICD-10-CM

## 2021-11-24 MED ORDER — TAPENTADOL HCL 75 MG PO TABS: 75.0000 mg | ORAL_TABLET | Freq: Two times a day (BID) | ORAL | 0 refills | Status: DC | PRN

## 2021-12-12 ENCOUNTER — Emergency Department (HOSPITAL_BASED_OUTPATIENT_CLINIC_OR_DEPARTMENT_OTHER): Payer: Federal, State, Local not specified - PPO

## 2021-12-12 ENCOUNTER — Emergency Department (HOSPITAL_BASED_OUTPATIENT_CLINIC_OR_DEPARTMENT_OTHER)
Admission: EM | Admit: 2021-12-12 | Discharge: 2021-12-12 | Disposition: A | Discharge status: Home / Self Care | Payer: Federal, State, Local not specified - PPO | Attending: Emergency Medicine | Admitting: Emergency Medicine

## 2021-12-12 ENCOUNTER — Emergency Department (HOSPITAL_COMMUNITY): Payer: Federal, State, Local not specified - PPO

## 2021-12-12 ENCOUNTER — Encounter (HOSPITAL_BASED_OUTPATIENT_CLINIC_OR_DEPARTMENT_OTHER): Payer: Self-pay | Admitting: Emergency Medicine

## 2021-12-12 ENCOUNTER — Other Ambulatory Visit: Payer: Self-pay

## 2021-12-12 DIAGNOSIS — M79602 Pain in left arm: Secondary | ICD-10-CM | POA: Diagnosis not present

## 2021-12-12 DIAGNOSIS — R200 Anesthesia of skin: Secondary | ICD-10-CM

## 2021-12-12 DIAGNOSIS — M79605 Pain in left leg: Secondary | ICD-10-CM | POA: Insufficient documentation

## 2021-12-12 DIAGNOSIS — E119 Type 2 diabetes mellitus without complications: Secondary | ICD-10-CM | POA: Insufficient documentation

## 2021-12-12 DIAGNOSIS — Z794 Long term (current) use of insulin: Secondary | ICD-10-CM | POA: Insufficient documentation

## 2021-12-12 DIAGNOSIS — Z20822 Contact with and (suspected) exposure to covid-19: Secondary | ICD-10-CM | POA: Insufficient documentation

## 2021-12-12 DIAGNOSIS — I10 Essential (primary) hypertension: Secondary | ICD-10-CM | POA: Diagnosis not present

## 2021-12-12 DIAGNOSIS — Z79899 Other long term (current) drug therapy: Secondary | ICD-10-CM | POA: Diagnosis not present

## 2021-12-12 DIAGNOSIS — R29818 Other symptoms and signs involving the nervous system: Secondary | ICD-10-CM | POA: Diagnosis not present

## 2021-12-12 DIAGNOSIS — R202 Paresthesia of skin: Secondary | ICD-10-CM | POA: Diagnosis not present

## 2021-12-12 LAB — RESP PANEL BY RT-PCR (FLU A&B, COVID) ARPGX2
Influenza A by PCR: NEGATIVE
Influenza B by PCR: NEGATIVE
SARS Coronavirus 2 by RT PCR: NEGATIVE

## 2021-12-12 LAB — COMPREHENSIVE METABOLIC PANEL
ALT: 15 U/L (ref 0–44)
AST: 14 U/L — ABNORMAL LOW (ref 15–41)
Albumin: 4.4 g/dL (ref 3.5–5.0)
Alkaline Phosphatase: 49 U/L (ref 38–126)
Anion gap: 8 (ref 5–15)
BUN: 12 mg/dL (ref 6–20)
CO2: 27 mmol/L (ref 22–32)
Calcium: 10.6 mg/dL — ABNORMAL HIGH (ref 8.9–10.3)
Chloride: 103 mmol/L (ref 98–111)
Creatinine, Ser: 0.97 mg/dL (ref 0.44–1.00)
GFR, Estimated: >60 mL/min (ref >60)
Glucose, Bld: 95 mg/dL (ref 70–99)
Potassium: 3.7 mmol/L (ref 3.5–5.1)
Sodium: 138 mmol/L (ref 135–145)
Total Bilirubin: 0.5 mg/dL (ref 0.3–1.2)
Total Protein: 7.3 g/dL (ref 6.5–8.1)

## 2021-12-12 LAB — CBC
HCT: 43.2 % (ref 36.0–46.0)
Hemoglobin: 14.3 g/dL (ref 12.0–15.0)
MCH: 28.5 pg (ref 26.0–34.0)
MCHC: 33.1 g/dL (ref 30.0–36.0)
MCV: 86.2 fL (ref 80.0–100.0)
Platelets: 303 10*3/uL (ref 150–400)
RBC: 5.01 MIL/uL (ref 3.87–5.11)
RDW: 12.8 % (ref 11.5–15.5)
WBC: 7.5 10*3/uL (ref 4.0–10.5)
nRBC: 0 % (ref 0.0–0.2)

## 2021-12-12 LAB — ETHANOL: Alcohol, Ethyl (B): <10 mg/dL (ref <10)

## 2021-12-12 LAB — RAPID URINE DRUG SCREEN, HOSP PERFORMED
Amphetamines: NOT DETECTED
Barbiturates: NOT DETECTED
Benzodiazepines: NOT DETECTED
Cocaine: NOT DETECTED
Opiates: NOT DETECTED
Tetrahydrocannabinol: NOT DETECTED

## 2021-12-12 LAB — DIFFERENTIAL
Abs Immature Granulocytes: 0.01 10*3/uL (ref 0.00–0.07)
Basophils Absolute: 0 10*3/uL (ref 0.0–0.1)
Basophils Relative: 0 %
Eosinophils Absolute: 0.2 10*3/uL (ref 0.0–0.5)
Eosinophils Relative: 3 %
Immature Granulocytes: 0 %
Lymphocytes Relative: 59 %
Lymphs Abs: 4.3 10*3/uL — ABNORMAL HIGH (ref 0.7–4.0)
Monocytes Absolute: 0.3 10*3/uL (ref 0.1–1.0)
Monocytes Relative: 4 %
Neutro Abs: 2.6 10*3/uL (ref 1.7–7.7)
Neutrophils Relative %: 34 %

## 2021-12-12 LAB — URINALYSIS, ROUTINE W REFLEX MICROSCOPIC
Bilirubin Urine: NEGATIVE
Glucose, UA: NEGATIVE mg/dL
Hgb urine dipstick: NEGATIVE
Ketones, ur: NEGATIVE mg/dL
Leukocytes,Ua: NEGATIVE
Nitrite: NEGATIVE
Protein, ur: NEGATIVE mg/dL
Specific Gravity, Urine: 1.008 (ref 1.005–1.030)
pH: 5.5 (ref 5.0–8.0)

## 2021-12-12 LAB — PREGNANCY, URINE: Preg Test, Ur: NEGATIVE

## 2021-12-12 LAB — APTT: aPTT: 31 seconds (ref 24–36)

## 2021-12-12 LAB — PROTIME-INR
INR: 1 (ref 0.8–1.2)
Prothrombin Time: 12.9 seconds (ref 11.4–15.2)

## 2021-12-12 LAB — CBG MONITORING, ED: Glucose-Capillary: 108 mg/dL — ABNORMAL HIGH (ref 70–99)

## 2021-12-12 MED ORDER — ASPIRIN 81 MG PO CHEW: 81.0000 mg | CHEWABLE_TABLET | Freq: Every day | ORAL | 0 refills | Status: AC

## 2021-12-12 MED ORDER — CLOPIDOGREL BISULFATE 75 MG PO TABS: 75.0000 mg | ORAL_TABLET | Freq: Every day | ORAL | 0 refills | Status: DC

## 2021-12-12 MED ORDER — PREDNISONE 10 MG (21) PO TBPK: ORAL_TABLET | Freq: Every day | ORAL | 0 refills | Status: DC

## 2021-12-12 NOTE — ED Notes (Signed)
Tele Code Stroke Activated

## 2021-12-12 NOTE — Discharge Instructions (Addendum)
Your work-up today was overall reassuring.  You likely had what is called TIA also known as mini stroke.  Your symptoms resolved prior to discharge.  Your MRI did not show any evidence of stroke.  Left leg pain and numbness that has been ongoing for over a week is likely sciatica.  I have given you prednisone taper.  Follow-up with your primary care provider.  Of also given you follow-up with neurology.  You will need to follow-up with them in 2 to 4 weeks.  We also started you on dual antiplatelet therapy with aspirin and Plavix.  Take both together for 3 weeks and then after 3 weeks discontinue the Plavix and continue taking aspirin.  Return to emergency room for any worsening symptoms.  Otherwise follow-up with neurology. Neurologist office should call you to schedule this appointment.  However if you do not hear from them by middle of next week please call to get this appointment scheduled.

## 2021-12-12 NOTE — ED Notes (Signed)
Pt transferred from [facility] via [facility] for MRI to rule out stroke. Head CT negative. Pt A&Ox4, 18G R AC

## 2021-12-12 NOTE — ED Provider Notes (Cosign Needed Addendum)
CT head negative for acute findings.  Teleneurology was consulted who recommended MRI brain, and to start DAPT.  Per their note it appears they recommended admission.  Patient currently is waiting in the emergency room for MRI.  Will await results and discuss with neurology.  She is also complaining of left lower extremity pain that was believed to be separate issue from her current presentation when looking at neurology note.  Currently with resolution of left facial numbness, and left arm numbness.  Only left lower extremity pain and paresthesias persist.  This has been ongoing for couple weeks.  States it worsened after she started her current job.  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  Physical Exam  BP 114/81   Pulse 80   Temp 98.9 F (37.2 C) (Oral)   Resp 14   Ht '5\' 5"'$  (1.651 m)   Wt (S) 84 kg   LMP 07/09/2015 (Exact Date)   SpO2 100%   BMI 30.82 kg/m     Procedures  Procedures  ED Course / MDM   Clinical Course as of 12/12/21 1715  Fri Dec 12, 2021  0704 A&O x4, denies change or new complaints, vital signs stable. [BM]  0920 Reassessment, ANO x4, reports feeling somewhat improved.  Awaiting MRI.  Vital signs stable. [BM]  1230 Patient reassessed, sleeping comfortably in bed no acute distress, easily arousable to voice.  Vital signs stable.  No complaints or concerns at this time.  Continues to await MRI. [BM]  8032 Patient reports that the MRI could not perform examination as she has her clips in.  Patient says that she can take her her clips out with some water here in the ER.  She is working on that now. [BM]    Clinical Course User Index [BM] Deliah Boston, PA-C   Medical Decision Making Amount and/or Complexity of Data Reviewed Labs: ordered. Radiology: ordered.  Risk OTC drugs. Prescription drug management.   MRI negative.  Patient discussed with on-call neurology.  Dr. Rory Percy.  Recommends discharging patient for outpatient neurology follow-up in 2 to 4 weeks.   Recommend starting patient on dual antiplatelet therapies for 3 weeks and aspirin alone thereafter.  Discussed with patient.  She is in agreement with this plan.  Her left lower extremity pain sounds consistent with sciatica.  Straight leg raise test is positive on the left, and opposite straight leg raise test is positive as well.  We will start patient on prednisone taper.  Return precautions discussed.  Patient voices understanding and is in agreement with plan.      Evlyn Courier, PA-C 12/12/21 1800    Luna Fuse, MD 12/13/21 [phone]

## 2021-12-12 NOTE — Progress Notes (Signed)
Code stroke activated at 0255, patient in CT. Patient returned at 0259. Dr. Corlis Leak connected at [place]. MRS 0.

## 2021-12-12 NOTE — ED Notes (Signed)
Tele neurology MD and EDP Roxanne Mins discussing pt POC.

## 2021-12-12 NOTE — Progress Notes (Signed)
Pt brought over to MRI.  Pt has a wig that has a metal clip in the back and is glued onto head.  Pt refused to remove the wig.  When pt agrees to remove the wig we will scan the pt.  Thanks

## 2021-12-12 NOTE — ED Notes (Signed)
EDP Roxanne Mins to triage for pt assessment

## 2021-12-12 NOTE — ED Notes (Signed)
US at bedside

## 2021-12-12 NOTE — ED Provider Notes (Signed)
46 year old female arrives via transfer from [facility] for MRI of the brain.  Patient was seen and evaluated last night by Dr. Roxanne Mins, please see previous provider note for full details of visit.  In short patient with history of hypertension diabetes arrived for evaluation of numbness of the left side of face and left upper arm that began around 11:30 PM last night, additionally complaining of pain in the left thigh and calf x1 week.  Code stroke was activated for the numbness, left leg pain was thought to be possible sciatica.  Patient was not a candidate for thrombolytic therapy, neurology recommended MRI to rule out stroke along with ultrasound to rule out DVT.  Doppler studies last night did not show any evidence for DVT.  Plan Per previous provider note is that if MRI of the brain is negative patient will need to be treated for left-sided sciatica.   Physical Exam  BP 125/80   Pulse 75   Temp 98.9 F (37.2 C) (Oral)   Resp 15   Ht '5\' 5"'$  (1.651 m)   Wt (S) 84 kg   LMP 07/09/2015 (Exact Date)   SpO2 99%   BMI 30.82 kg/m   Physical Exam Constitutional:      General: She is not in acute distress.    Appearance: Normal appearance. She is well-developed. She is not ill-appearing or diaphoretic.  HENT:     Head: Normocephalic and atraumatic.  Eyes:     General: Vision grossly intact. Gaze aligned appropriately.     Pupils: Pupils are equal, round, and reactive to light.  Neck:     Trachea: Trachea and phonation normal.  Pulmonary:     Effort: Pulmonary effort is normal. No respiratory distress.  Abdominal:     General: There is no distension.     Palpations: Abdomen is soft.     Tenderness: There is no abdominal tenderness. There is no guarding or rebound.  Musculoskeletal:        General: Normal range of motion.     Cervical back: Normal range of motion.     Comments: No midline spinal tenderness palpation.  No crepitus step-off or deformity the spine.  Sensation  intact and equal bilateral lower extremities.  5/5 strength with EHL, dorsi/plantarflexion and knee extension/flexion.  Negative clonus of the feet.  Strong equal pedal pulses.  Compartments soft.    Skin:    General: Skin is warm and dry.  Neurological:     Mental Status: She is alert.     GCS: GCS eye subscore is 4. GCS verbal subscore is 5. GCS motor subscore is 6.     Comments: Speech is clear and goal oriented, follows commands Major Cranial nerves without deficit, no facial droop Moves extremities without ataxia, coordination intact  Psychiatric:        Behavior: Behavior normal.     Procedures  Procedures  ED Course / MDM   Clinical Course as of 12/12/21 1418  Fri Dec 12, 2021  0704 A&O x4, denies change or new complaints, vital signs stable. [BM]  0920 Reassessment, ANO x4, reports feeling somewhat improved.  Awaiting MRI.  Vital signs stable. [BM]  1230 Patient reassessed, sleeping comfortably in bed no acute distress, easily arousable to voice.  Vital signs stable.  No complaints or concerns at this time.  Continues to await MRI. [BM]  3335 Patient reports that the MRI could not perform examination as she has her clips in.  Patient says that she  can take her her clips out with some water here in the ER.  She is working on that now. [BM]    Clinical Course User Index [BM] Deliah Boston, PA-C   Medical Decision Making Amount and/or Complexity of Data Reviewed Labs: ordered. Radiology: ordered.   I have personally reviewed and interpreted the following labs: CBC without leukocytosis to suggest infectious process.  No anemia or thrombocytopenia. PT/INR within normal limits. APTT within normal alignment. CMP shows no emergent electrolyte derangement, AKI, LFT elevations or gap. CBG 108, no evidence for hypoglycemia. Ethanol negative, patient does not appear intoxicated or in withdrawal. COVID/influenza panel negative. Pregnancy test negative. Urinalysis shows no  evidence for infection. UDS negative.  Radiology interpretation of left leg DVT study: IMPRESSION:  No evidence of left lower extremity deep venous thrombosis.      Electronically Signed    By: Genevie Ann M.D.    On: 12/12/2021 05:11   I have personally reviewed patient's EKG I do not appreciate any obvious acute ischemic changes.  Radiology interpretation of CT head:  IMPRESSION:  1. Normal head CT.  No acute intracranial abnormality.  2. ASPECTS is 10.    Critical Value/emergent results were called by telephone at the time  of interpretation on 12/12/2021 at 3:12 am to provider DAVID [facility] ,  who verbally acknowledged these results.      Electronically Signed    By: Jeannine Boga M.D.    On: 12/12/2021 03:14  I have personally reviewed patient's CT head I do not appreciate any obvious intracranial hemorrhages or shift. ==============================================  Conflicting EDP and neurology notes as to disposition after MRI.  Care handoff given to [facility] at shift change, plan of care is to consult neurology after MRI results.  Final disposition per oncoming team.     Note: Portions of this report may have been transcribed using voice recognition software. Every effort was made to ensure accuracy; however, inadvertent computerized transcription errors may still be present.        Gari Crown 12/12/21 1513    Davonna Belling, MD 12/12/21 1521

## 2021-12-12 NOTE — ED Triage Notes (Signed)
Leg pain started a few days ago. Started having numbness and tingling in left arm and left side of face starting 2300.

## 2021-12-12 NOTE — ED Notes (Signed)
Pt returned from MRI- pt refuse to take off her wig to have MRI done.

## 2021-12-12 NOTE — ED Notes (Signed)
Patient transported to MRI 

## 2021-12-12 NOTE — ED Notes (Addendum)
Pt transported to CT with triage RN Barnett Applebaum

## 2021-12-12 NOTE — Consult Note (Signed)
[place] [facility] TeleNeurology Consult Services   Patient Name:   Stacey Stanley, Stacey Stanley Date of Birth:   1975-11-27 Identification Number:   MRN - [id number] Date of Service:   12/12/2021 02:59:57  Diagnosis:       R20.2 - Paresthesia of skin       R29.810 - Facial numbness/ Facial weakness  Impression:      46yoF hx of HTN and chronic back pain present with sudden onset left face and arm tingling sensation. CTH neg for acute finding. Not thrombolytics candidate, no disabling deficit. Exam is not suggestive of LVO. Recommend MRI brain w/o to rule out stroke, start DAPT for 21 days followed by aspirin '81mg'$  only afterward, and permissive HTN.    Patient also c/o of LLE pain that is likely separate issue from her current presentation. ED physician is aware and does not suspect DVT at this time.  Our recommendations are outlined below.  Recommendations:        Stroke/Telemetry Floor       Neuro Checks       Bedside Swallow Eval       DVT Prophylaxis       IV Fluids, Normal Saline       Head of Bed 30 Degrees       Euglycemia and Avoid Hyperthermia (PRN Acetaminophen)       Initiate dual antiplatelet therapy with Aspirin 81 mg daily and Clopidogrel 75 mg daily.       Antihypertensives PRN if Blood pressure is greater than 220/120 or there is a concern for End organ damage/contraindications for permissive HTN. If blood pressure is greater than 220/120 give labetalol PO or IV or Vasotec IV with a goal of 15% reduction in BP during the first 24 hours.  Per facility request will defer further work up, management, and referrals to inpatient service, inclusive of inpatient neurology consult.  Sign Out:       Discussed with Emergency Department Provider    ------------------------------------------------------------------------------  Advanced Imaging: Advanced Imaging Deferred because:  Non-disabling symptoms as verified by the patient; no cortical signs so not consistent  with LVO   Metrics: Last Known Well: 12/11/2021 23:30:00 [facility] Notification Time: 12/12/2021 02:59:57 Arrival Time: 12/12/2021 02:40:00 Stamp Time: 12/12/2021 02:59:57 Initial Response Time: 12/12/2021 03:02:05 Symptoms: left face and arm tingling . Initial patient interaction: 12/12/2021 03:04:49 NIHSS Assessment Completed: 12/12/2021 03:11:56 Patient is not a candidate for Thrombolytic. Thrombolytic Medical Decision: 12/12/2021 03:11:57 Patient was not deemed candidate for Thrombolytic because of following reasons: No disabling symptoms.  I personally Reviewed the CT Head and it Showed no hemorrhage  Primary Provider Notified of Diagnostic Impression and Management Plan on: 12/12/2021 03:21:34    ------------------------------------------------------------------------------  History of Present Illness: Patient is a 46 year old Female.  Patient was brought by private transportation with symptoms of left face and arm tingling . Patient reports she has been having left calf pain that progress up to her thigh for the past couple days. Denies numbness/tingling sensation. Denies swelling. Described as constant dull pain. Has history of chronic back pain but no radiation down to left leg or more pain than normal. 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 Denies saddle anesthesia or incontinence.  7/13 night she noticed left face and upper arm tingling sensation around 11:30 when she tries to go to sleep. She decided to seek medical attention due to the tingling sensation was not going away. Denies previous similar episode. She checked her BP at home and was  elevatd compare to normal. She described pins-needle sensation at left cheek/jaw region and left shoulder upper arm region. Denies neck pain. Initially had some chest pain but has since then resolved.     Past Medical History:      Hypertension  Medications:  No Anticoagulant use  Antiplatelet  use: Yes aspirin '81mg'$  Reviewed EMR for current medications  Allergies:  Reviewed Description: Nuvigil  Social History: Smoking: Yes  Family History:  There is no family history of premature cerebrovascular disease pertinent to this consultation  ROS : 14 Points Review of Systems was performed and was negative except mentioned in HPI.  Past Surgical History: There Is No Surgical History Contributory To Today's Visit    Examination: BP(123/94), Pulse(85), Blood Glucose(108) 1A: Level of Consciousness - Alert; keenly responsive + 0 1B: Ask Month and Age - Both Questions Right + 0 1C: Blink Eyes & Squeeze Hands - Performs Both Tasks + 0 2: Test Horizontal Extraocular Movements - Normal + 0 3: Test Visual Fields - No Visual Loss + 0 4: Test Facial Palsy (Use Grimace if Obtunded) - Normal symmetry + 0 5A: Test Left Arm Motor Drift - No Drift for 10 Seconds + 0 5B: Test Right Arm Motor Drift - No Drift for 10 Seconds + 0 6A: Test Left Leg Motor Drift - No Drift for 5 Seconds + 0 6B: Test Right Leg Motor Drift - No Drift for 5 Seconds + 0 7: Test Limb Ataxia (FNF/Heel-Shin) - No Ataxia + 0 8: Test Sensation - Normal; No sensory loss + 0 9: Test Language/Aphasia - Normal; No aphasia + 0 10: Test Dysarthria - Normal + 0 11: Test Extinction/Inattention - No abnormality + 0  NIHSS Score: 0   Pre-Morbid Modified Rankin Scale: 0 Points = No symptoms at all   Patient/Family was informed the Neurology Consult would occur via TeleHealth consult by way of interactive audio and video telecommunications and consented to receiving care in this manner.   Patient is being evaluated for possible acute neurologic impairment and high probability of imminent or life-threatening deterioration. I spent total of 31 minutes providing care to this patient, including time for face to face visit via telemedicine, review of medical records, imaging studies and discussion of findings with providers,  the patient and/or family.   Dr Edward Jolly   [facility] For Inpatient follow-up with [facility] physician please call [phone]. This is not an outpatient service. Post hospital discharge, please contact hospital directly.

## 2021-12-12 NOTE — ED Provider Notes (Signed)
[facility] EMERGENCY DEPT Provider Note   CSN: [id number] Arrival date & time: 12/12/21  0240     History  Chief Complaint  Patient presents with   Numbness    Stacey Stanley is a 46 y.o. female.  The history is provided by the patient.  She has history of hypertension, diabetes and comes in because of numbness in the left side of her face on the left upper arm which started at 11:30 PM.  She denies any weakness.  She did have a headache earlier in the night which had resolved.  She denies any difficulty walking.  She had been having pain in her left thigh and calf for the last week which had generally been doing well with over-the-counter NSAIDs and acetaminophen.   Home Medications Prior to Admission medications   Medication Sig Start Date End Date Taking? Authorizing Provider  Dapsone 5 % topical gel Apply 60 g topically daily. 09/21/19   [provider]  hydrochlorothiazide (HYDRODIURIL) 12.5 MG tablet Take 1 tablet (12.5 mg total) by mouth daily. 11/13/21   Steele Sizer, MD  hydrOXYzine (ATARAX) 10 MG tablet Take 1-2 tablets (10-20 mg total) by mouth at bedtime. 11/13/21   Steele Sizer, MD  imiquimod Leroy Sea) 5 % cream Apply 1 packet topically daily as needed (warts). 09/21/19   [provider]  Omega-3 Fatty Acids (FISH OIL) 1000 MG CAPS Take 2,000 mg by mouth daily.    [provider]  omeprazole (PRILOSEC) 40 MG capsule Take 1 capsule (40 mg total) by mouth 2 (two) times daily before a meal. 07/04/21 08/03/21  Vanga, Tally Due, MD  ondansetron (ZOFRAN) 4 MG tablet 1 tab(s) orally 3 times a day for 30 days 02/13/21   [provider]  promethazine (PHENERGAN) 25 MG tablet Take 1 tablet (25 mg total) by mouth every 8 (eight) hours as needed for nausea or vomiting. 08/15/21   Lin Landsman, MD  Semaglutide,0.25 or 0.'5MG'$ /DOS, (OZEMPIC, 0.25 OR 0.5 MG/DOSE,) 2 MG/3ML SOPN Inject 0.25-0.5 mg into the skin once a week. 11/13/21    Steele Sizer, MD  tapentadol HCl (NUCYNTA) 75 MG tablet Take 1 tablet (75 mg total) by mouth 2 (two) times daily as needed. 12/21/21 01/20/22  [name], MD  tapentadol HCl (NUCYNTA) 75 MG tablet Take 1 tablet (75 mg total) by mouth 2 (two) times daily as needed. 11/24/21 12/24/21  Milinda Pointer, MD  TRI-LUMA 0.01-4-0.05 % CREA Apply 30 mg topically daily. 09/21/19   [provider]  TRULANCE 3 MG TABS Take 1 tablet by mouth daily. 09/29/21   [provider]      Allergies    Nuvigil [armodafinil] and Ppd [tuberculin purified protein derivative]    Review of Systems   Review of Systems  All other systems reviewed and are negative.   Physical Exam Updated Vital Signs BP (!) 132/94 (BP Location: Right Arm)   Pulse 85   Temp 98.2 F (36.8 C)   Resp 19   LMP 07/09/2015 (Exact Date)   SpO2 100%  Physical Exam Vitals and nursing note reviewed.   46 year old female, resting comfortably and in no acute distress. Vital signs are significant for mildly elevated blood pressure. Oxygen saturation is 100%, which is normal. Head is normocephalic and atraumatic. PERRLA, EOMI. Oropharynx is clear. Neck is nontender and supple without adenopathy or JVD. Back is nontender and there is no CVA tenderness.  There is positive straight leg raise on the left at 30  degrees, negative on the right. Lungs are clear without rales, wheezes, or rhonchi. Chest is nontender. Heart has regular rate and rhythm without murmur. Abdomen is soft, flat, nontender. Extremities have no cyanosis or edema, full range of motion is present.  There is no calf tenderness, no asymmetric swelling. Skin is warm and dry without rash. Neurologic: Awake and alert, oriented x3, speech is normal.  Cranial nerves are normal including symmetric sensation in all 3 divisions of cranial nerve V.  Strength is 5/5 in all 4 extremities, no pronator drift.  Sensation is normal in all 4 extremities.  There is no  extinction on double simultaneous stimulation.  ED Results / Procedures / Treatments   Labs (all labs ordered are listed, but only abnormal results are displayed) Labs Reviewed  DIFFERENTIAL - Abnormal; Notable for the following components:      Result Value   Lymphs Abs 4.3 (*)    All other components within normal limits  COMPREHENSIVE METABOLIC PANEL - Abnormal; Notable for the following components:   Calcium 10.6 (*)    AST 14 (*)    All other components within normal limits  CBG MONITORING, ED - Abnormal; Notable for the following components:   Glucose-Capillary 108 (*)    All other components within normal limits  RESP PANEL BY RT-PCR (FLU A&B, COVID) ARPGX2  ETHANOL  PROTIME-INR  APTT  CBC  RAPID URINE DRUG SCREEN, HOSP PERFORMED  URINALYSIS, ROUTINE W REFLEX MICROSCOPIC  PREGNANCY, URINE    EKG EKG Interpretation  Date/Time:  Friday December 12 2021 03:03:44 EDT Ventricular Rate:  77 PR Interval:  171 QRS Duration: 95 QT Interval:  395 QTC Calculation: 447 R Axis:   52 Text Interpretation: Sinus rhythm RSR' in V1 or V2, probably normal variant When compared with ECG of 04/29/2021, No significant change was found Confirmed by Delora Fuel ([id number]) on 12/12/2021 3:08:19 AM  Radiology CT HEAD CODE STROKE WO CONTRAST  Result Date: 12/12/2021 CLINICAL DATA:  Code stroke. Initial evaluation for neuro deficit, stroke suspected. EXAM: CT HEAD WITHOUT CONTRAST TECHNIQUE: Contiguous axial images were obtained from the base of the skull through the vertex without intravenous contrast. RADIATION DOSE REDUCTION: This exam was performed according to the departmental dose-optimization program which includes automated exposure control, adjustment of the mA and/or kV according to patient size and/or use of iterative reconstruction technique. COMPARISON:  Prior CT from 02/14/2007. FINDINGS: Brain: Cerebral volume within normal limits. No acute intracranial hemorrhage. No acute large vessel  territory infarct. No mass lesion, midline shift or mass effect no hydrocephalus or extra-axial fluid collection. Vascular: No hyperdense vessel. Skull: Scalp soft tissues and calvarium within normal limits. Sinuses/Orbits: Globes and orbital soft tissues within normal limits. Paranasal sinuses are largely clear. No mastoid effusion. Other: None. ASPECTS [facility] Stroke Program Early CT Score) - Ganglionic level infarction (caudate, lentiform nuclei, internal capsule, insula, M1-M3 cortex): 7 - Supraganglionic infarction (M4-M6 cortex): 3 Total score (0-10 with 10 being normal): 10 IMPRESSION: 1. Normal head CT.  No acute intracranial abnormality. 2. ASPECTS is 10. Critical Value/emergent results were called by telephone at the time of interpretation on 12/12/2021 at 3:12 am to provider Jahir [facility] , who verbally acknowledged these results. Electronically Signed   By: Jeannine Boga M.D.   On: 12/12/2021 03:14    Procedures Procedures  Cardiac monitor shows normal sinus rhythm, per my interpretation.  Medications Ordered in ED Medications - No data to display  ED Course/ Medical Decision Making/ A&P  Medical Decision Making Amount and/or Complexity of Data Reviewed Labs: ordered. Radiology: ordered.   Left-sided facial and arm numbness concerning for stroke.  No objective findings on exam, but stroke is still a major consideration so code stroke is activated.  Left leg pain appears to be sciatica.  I have reviewed her old records, and see no relevant past visits.  I have ordered an emergent CT of head and teleneurology consultation.  Neurology consult is appreciated.  Numbness is concerning for stroke without evidence of large vessel occlusion, minimal deficit makes her not a candidate for thrombolytic therapy.  Recommendation was for urgent MRI scan to rule out stroke, also venous ultrasound to rule out DVT.  Venous ultrasound is obtained here and shows no evidence  of DVT.  I have independently viewed the images, and agree with radiologist interpretation.  She will need to be transferred to [facility] to get MRI.  I have discussed the case with Dr. Ralene Bathe, emergency physician at [facility] who agrees to accept the patient in transfer.  If MRI is negative, patient will need to be treated for left-sided sciatica.Marland Kitchen  CRITICAL CARE Performed by: Delora Fuel Total critical care time: 45 minutes Critical care time was exclusive of separately billable procedures and treating other patients. Critical care was necessary to treat or prevent imminent or life-threatening deterioration. Critical care was time spent personally by me on the following activities: development of treatment plan with patient and/or surrogate as well as nursing, discussions with consultants, evaluation of patient's response to treatment, examination of patient, obtaining history from patient or surrogate, ordering and performing treatments and interventions, ordering and review of laboratory studies, ordering and review of radiographic studies, pulse oximetry and re-evaluation of patient's condition.  Final Clinical Impression(s) / ED Diagnoses Final diagnoses:  Numbness and tingling in left arm  Left facial numbness  Pain in left leg    Rx / DC Orders ED Discharge Orders     None         Delora Fuel, MD 96/75/91 [phone]

## 2021-12-17 ENCOUNTER — Encounter: Payer: Self-pay | Admitting: Neurology

## 2021-12-18 ENCOUNTER — Other Ambulatory Visit: Payer: Self-pay | Admitting: Family Medicine

## 2021-12-18 DIAGNOSIS — R7303 Prediabetes: Secondary | ICD-10-CM

## 2021-12-18 DIAGNOSIS — E8881 Metabolic syndrome: Secondary | ICD-10-CM

## 2022-01-08 ENCOUNTER — Ambulatory Visit
Payer: Federal, State, Local not specified - PPO | Attending: Student in an Organized Health Care Education/Training Program | Admitting: Student in an Organized Health Care Education/Training Program

## 2022-01-08 ENCOUNTER — Encounter: Payer: Self-pay | Admitting: Student in an Organized Health Care Education/Training Program

## 2022-01-08 VITALS — BP 129/84 | HR 97 | Temp 97.3°F | Resp 16 | Ht 65.0 in | Wt 180.0 lb

## 2022-01-08 DIAGNOSIS — G894 Chronic pain syndrome: Secondary | ICD-10-CM

## 2022-01-08 DIAGNOSIS — F320 Major depressive disorder, single episode, mild: Secondary | ICD-10-CM

## 2022-01-08 DIAGNOSIS — N809 Endometriosis, unspecified: Secondary | ICD-10-CM | POA: Diagnosis not present

## 2022-01-08 DIAGNOSIS — Z79899 Other long term (current) drug therapy: Secondary | ICD-10-CM

## 2022-01-08 DIAGNOSIS — R102 Pelvic and perineal pain: Secondary | ICD-10-CM | POA: Diagnosis not present

## 2022-01-08 DIAGNOSIS — G8929 Other chronic pain: Secondary | ICD-10-CM

## 2022-01-08 DIAGNOSIS — G4700 Insomnia, unspecified: Secondary | ICD-10-CM

## 2022-01-08 MED ORDER — TAPENTADOL HCL 75 MG PO TABS: 75.0000 mg | ORAL_TABLET | Freq: Two times a day (BID) | ORAL | 0 refills | Status: DC | PRN

## 2022-01-08 MED ORDER — TAPENTADOL HCL 75 MG PO TABS: 75.0000 mg | ORAL_TABLET | Freq: Two times a day (BID) | ORAL | 0 refills | Status: AC | PRN

## 2022-01-08 NOTE — Progress Notes (Signed)
Nursing Pain Medication Assessment:  Safety precautions to be maintained throughout the outpatient stay will include: orient to surroundings, keep bed in low position, maintain call bell within reach at all times, provide assistance with transfer out of bed and ambulation.  Medication Inspection Compliance: Pill count conducted under aseptic conditions, in front of the patient. Neither the pills nor the bottle was removed from the patient's sight at any time. Once count was completed pills were immediately returned to the patient in their original bottle.  Medication:  nucynta Pill/Patch Count:  20.5 of 60 pills remain Pill/Patch Appearance: Markings consistent with prescribed medication Bottle Appearance: Standard pharmacy container. Clearly labeled. Filled Date: 07 / 24 / 2023 Last Medication intake:  Yesterday

## 2022-01-08 NOTE — Progress Notes (Signed)
PROVIDER NOTE: Information contained herein reflects review and annotations entered in association with encounter. Interpretation of such information and data should be left to medically-trained personnel. Information provided to patient can be located elsewhere in the medical record under "Patient Instructions". Document created using STT-dictation technology, any transcriptional errors that may result from process are unintentional.    Patient: Stacey Stanley  Service Category: E/M  Provider: [name], MD  DOB: 1975/10/30  DOS: 01/08/2022  Referring Provider: Steele Sizer, MD  MRN: [id number]  Specialty: Interventional Pain Management  PCP: Steele Sizer, MD  Type: Established Patient  Setting: Ambulatory outpatient    Location: Office  Delivery: Face-to-face     HPI  [name], a 46 y.o. year old female, is here today because of her Chronic pain syndrome [G89.4]. Ms. Ramthun primary complain today is Pelvic Pain Last encounter: My last encounter with her was on 10/09/21 Pertinent problems: Ms. Janvier has Endometriosis; Chronic pelvic pain in female; Cervical radiculitis; Chronic pain syndrome; and Opiate use on their pertinent problem list. Pain Assessment: Severity of Chronic pain is reported as a 6 /10. Location: Pelvis  /radiates to left side sometime. Onset: More than a month ago. Quality: Sharp. Timing: Constant. Modifying factor(s): meds, heat. Vitals:  height is '5\' 5"'  (1.651 m) and weight is 180 lb (81.6 kg). Her temperature is 97.3 F (36.3 C) (abnormal). Her blood pressure is 129/84 and her pulse is 97. Her respiration is 16 and oxygen saturation is 100%.   Reason for encounter: medication management.   Patient presents today for medication management.  Unfortunately, since her last clinic visit with me she had a TIA.  Her presenting symptoms were left upper extremity weakness followed by left lower extremity weakness.  She presented to the emergency department  where they did a CT scan.  She was started on Plavix and is also on 80 mg of aspirin.  She will be seeing a neurologist coming up.  Will be refilling her Nucynta as below.  No change in dose.  We will also renew her urine toxicology screen for medication compliance monitoring.  She has full strength and sensory function.  Pharmacotherapy Assessment  Analgesic: Nucynta 75 mg twice daily as needed, quantity 45/month; MME equals 45   Monitoring: [place] PMP: PDMP reviewed during this encounter.       Pharmacotherapy: No side-effects or adverse reactions reported. Compliance: No problems identified. Effectiveness: Clinically acceptable.  Dewayne Shorter, RN  01/08/2022  7:57 AM  Sign when Signing Visit Nursing Pain Medication Assessment:  Safety precautions to be maintained throughout the outpatient stay will include: orient to surroundings, keep bed in low position, maintain call bell within reach at all times, provide assistance with transfer out of bed and ambulation.  Medication Inspection Compliance: Pill count conducted under aseptic conditions, in front of the patient. Neither the pills nor the bottle was removed from the patient's sight at any time. Once count was completed pills were immediately returned to the patient in their original bottle.  Medication:  nucynta Pill/Patch Count:  20.5 of 60 pills remain Pill/Patch Appearance: Markings consistent with prescribed medication Bottle Appearance: Standard pharmacy container. Clearly labeled. Filled Date: 07 / 24 / 2023 Last Medication intake:  Yesterday    UDS:  Summary  Date Value Ref Range Status  12/31/2020 Note  Final    Comment:    ==================================================================== ToxASSU[employer] Select 13 (MW) ==================================================================== Test  Result       Flag       Units  Drug Present and Declared for Prescription Verification   Tapentadol                      >3390        EXPECTED   ng/mg creat    Source of tapentadol is a scheduled prescription medication.  ==================================================================== Test                      Result    Flag   Units      Ref Range   Creatinine              295              mg/dL      >=20 ==================================================================== Declared Medications:  The flagging and interpretation on this report are based on the  following declared medications.  Unexpected results may arise from  inaccuracies in the declared medications.   **Note: The testing scope of this panel includes these medications:   Tapentadol (Nucynta)   **Note: The testing scope of this panel does not include the  following reported medications:   Dapsone  Fish Oil  Hydrochlorothiazide (Hydrodiuril)  Ibuprofen (Advil)  Imiquimod (Aldara)  Metformin (Glucophage)  Nicotine  Promethazine (Phenergan)  Semaglutide (Ozempic)  Topical  Vitamin C ==================================================================== For clinical consultation, please call [phone]. ====================================================================      ROS  Constitutional: Denies any fever or chills Gastrointestinal: No reported hemesis, hematochezia, vomiting, or acute GI distress Musculoskeletal: Denies any acute onset joint swelling, redness, loss of ROM, or weakness Neurological: No reported episodes of acute onset apraxia, aphasia, dysarthria, agnosia, amnesia, paralysis, loss of coordination, or loss of consciousness  Medication Review  Dapsone, Fish Oil, Fluocin-Hydroquinone-Tretinoin, Plecanatide, Semaglutide(0.25 or 0.5MG/DOS), acetaminophen, aspirin, clopidogrel, hydrOXYzine, hydrochlorothiazide, ibuprofen, imiquimod, ondansetron, predniSONE, and tapentadol HCl  History Review  Allergy: Ms. Courser is allergic to nuvigil [armodafinil] and ppd [tuberculin purified protein  derivative]. Drug: Ms. Kretz  reports no history of drug use. Alcohol:  reports no history of alcohol use. Tobacco:  reports that she has quit smoking. Her smoking use included cigarettes. She started smoking about 28 years ago. She has a 12.50 pack-year smoking history. She has never used smokeless tobacco. Social: Ms. Weatherman  reports that she has quit smoking. Her smoking use included cigarettes. She started smoking about 28 years ago. She has a 12.50 pack-year smoking history. She has never used smokeless tobacco. She reports that she does not drink alcohol and does not use drugs. Medical:  has a past medical history of Anxiety, Arthritis, Chronic insomnia, Depression, Diabetes mellitus without complication ([place]), Dysrhythmia, Endometriosis, GERD (gastroesophageal reflux disease), Gout, Headache(784.0), History of kidney stones, Hypertension, Metabolic syndrome, Obesity, Right thyroid nodule (2017), Serum calcium elevated, Sleep disorder, circadian, shift work type, SVD (spontaneous vaginal delivery), Tachycardia, and TIA (transient ischemic attack). Surgical: Ms. Sawtelle  has a past surgical history that includes metatarsil  (2003); Dilation and curettage of uterus; Induced abortion; laparoscopy (1992); Eye surgery (06/28/2013); laparoscopy (N/A, 07/19/2013); Hysteroscopy with D & C (N/A, 07/19/2013); Abdominal hysterectomy (05/21/2016); Hemorroidectomy (04/04/2018); Colonoscopy (2019); Colonoscopy with propofol (N/A, 07/04/2021); Esophagogastroduodenoscopy (egd) with propofol (N/A, 07/04/2021); and Rectal surgery (08/15/2021). Family: family history includes Alzheimer's disease in her paternal grandfather; Diabetes in her mother; Heart disease in her brother; Hypertension in her father and mother; Kidney disease in her father; Leukemia in her paternal grandmother; Stroke  in her maternal grandmother.  Laboratory Chemistry Profile   Renal Lab Results  Component Value Date   BUN 12 12/12/2021    CREATININE 0.97 03/17/5101   BCR NOT APPLICABLE 58/52/7782   GFRAA 108 11/28/2020   GFRNONAA >60 12/12/2021    Hepatic Lab Results  Component Value Date   AST 14 (L) 12/12/2021   ALT 15 12/12/2021   ALBUMIN 4.4 12/12/2021   ALKPHOS 49 12/12/2021   AMYLASE 102 (H) 05/22/2016   LIPASE 28 04/29/2021    Electrolytes Lab Results  Component Value Date   NA 138 12/12/2021   K 3.7 12/12/2021   CL 103 12/12/2021   CALCIUM 10.6 (H) 12/12/2021   MG 1.5 09/15/2017    Bone Lab Results  Component Value Date   VD25OH 37 08/08/2021    Inflammation (CRP: Acute Phase) (ESR: Chronic Phase) Lab Results  Component Value Date   CRP 0.2 05/06/2020   ESRSEDRATE 2 05/06/2020         Note: Above Lab results reviewed.  Recent Imaging Review  MR BRAIN WO CONTRAST CLINICAL DATA:  Neuro deficit, acute, stroke suspected  EXAM: MRI HEAD WITHOUT CONTRAST  TECHNIQUE: Multiplanar, multiecho pulse sequences of the brain and surrounding structures were obtained without intravenous contrast.  COMPARISON:  CT head from the same day.  FINDINGS: Artifact (presumably from braces) limits assessment and obscures a large portion of the frontal lobes, anterior temporal lobes and posterior fossa on diffusion-weighted and susceptibility weighted imaging. Within this limitation:  Brain: No visible evidence of acute infarct, acute hemorrhage, mass lesion, midline shift, or hydrocephalus.  Vascular: Major arterial flow voids are maintained at the skull base.  Skull and upper cervical spine: Normal marrow signal.  Sinuses/Orbits: Visible sinuses are clear. No acute orbital finding.  Other: No sizable mastoid effusions.  IMPRESSION: Artifact (presumably from braces) limits assessment without visible evidence of acute intracranial abnormality.  Electronically Signed   By: Margaretha Sheffield M.D.   On: 12/12/2021 16:45 US Venous Img Lower Unilateral Left CLINICAL DATA:  46 year old female with  pain from the left thigh to the calf for 1 week. Varicose veins.  EXAM: LEFT LOWER EXTREMITY VENOUS DOPPLER ULTRASOUND  TECHNIQUE: Gray-scale sonography with compression, as well as color and duplex ultrasound, were performed to evaluate the deep venous system(s) from the level of the common femoral vein through the popliteal and proximal calf veins.  COMPARISON:  None Available.  FINDINGS: VENOUS  Normal compressibility of the common femoral, superficial femoral, and popliteal veins, as well as the visualized calf veins. Visualized portions of profunda femoral vein and great saphenous vein unremarkable. No filling defects to suggest DVT on grayscale or color Doppler imaging. Doppler waveforms show normal direction of venous flow, normal respiratory plasticity and response to augmentation.  Limited views of the contralateral common femoral vein are unremarkable.  OTHER  Left inguinal lymph node on series 1, image 16 appears normal with fatty hilum, 7 mm short axis.  Limitations: none  IMPRESSION: No evidence of left lower extremity deep venous thrombosis.  Electronically Signed   By: Genevie Ann M.D.   On: 12/12/2021 05:11 CT HEAD CODE STROKE WO CONTRAST CLINICAL DATA:  Code stroke. Initial evaluation for neuro deficit, stroke suspected.  EXAM: CT HEAD WITHOUT CONTRAST  TECHNIQUE: Contiguous axial images were obtained from the base of the skull through the vertex without intravenous contrast.  RADIATION DOSE REDUCTION: This exam was performed according to the departmental dose-optimization program which includes automated exposure control, adjustment of  the mA and/or kV according to patient size and/or use of iterative reconstruction technique.  COMPARISON:  Prior CT from 02/14/2007.  FINDINGS: Brain: Cerebral volume within normal limits. No acute intracranial hemorrhage. No acute large vessel territory infarct. No mass lesion, midline shift or mass effect no  hydrocephalus or extra-axial fluid collection.  Vascular: No hyperdense vessel.  Skull: Scalp soft tissues and calvarium within normal limits.  Sinuses/Orbits: Globes and orbital soft tissues within normal limits. Paranasal sinuses are largely clear. No mastoid effusion.  Other: None.  ASPECTS [facility] Stroke Program Early CT Score)  - Ganglionic level infarction (caudate, lentiform nuclei, internal capsule, insula, M1-M3 cortex): 7  - Supraganglionic infarction (M4-M6 cortex): 3  Total score (0-10 with 10 being normal): 10  IMPRESSION: 1. Normal head CT.  No acute intracranial abnormality. 2. ASPECTS is 10.  Critical Value/emergent results were called by telephone at the time of interpretation on 12/12/2021 at 3:12 am to provider [facility] , who verbally acknowledged these results.  Electronically Signed   By: Jeannine Boga M.D.   On: 12/12/2021 03:14 Note: Reviewed        Physical Exam  General appearance: Well nourished, well developed, and well hydrated. In no apparent acute distress Mental status: Alert, oriented x 3 (person, place, & time)       Respiratory: No evidence of acute respiratory distress Eyes: PERLA Vitals: BP 129/84   Pulse 97   Temp (!) 97.3 F (36.3 C)   Resp 16   Ht '5\' 5"'  (1.651 m)   Wt 180 lb (81.6 kg)   LMP 07/09/2015 (Exact Date)   SpO2 100%   BMI 29.95 kg/m  BMI: Estimated body mass index is 29.95 kg/m as calculated from the following:   Height as of this encounter: '5\' 5"'  (1.651 m).   Weight as of this encounter: 180 lb (81.6 kg). Ideal: Ideal body weight: 57 kg (125 lb 10.6 oz) Adjusted ideal body weight: 66.9 kg (147 lb 6.4 oz)  5 out of 5 strength bilateral upper extremity: Shoulder abduction, elbow flexion, elbow extension, thumb extension. 5 out of 5 strength bilateral lower extremity: Plantar flexion, dorsiflexion, knee flexion, knee extension.   Assessment   Diagnosis Status  1. Chronic pain syndrome   2.  Chronic pelvic pain in female   3. Endometriosis   4. Insomnia, persistent   5. Long term prescription benzodiazepine use   6. Mild major depression ([place])    Controlled Controlled Controlled    Plan of Care    [name] has a current medication list which includes the following long-term medication(s): hydrochlorothiazide.  Pharmacotherapy (Medications Ordered): Meds ordered this encounter  Medications   tapentadol HCl (NUCYNTA) 75 MG tablet    Sig: Take 1 tablet (75 mg total) by mouth 2 (two) times daily as needed for moderate pain.    Dispense:  60 tablet    Refill:  0    Chronic pain syndrome   tapentadol HCl (NUCYNTA) 75 MG tablet    Sig: Take 1 tablet (75 mg total) by mouth 2 (two) times daily as needed for moderate pain.    Dispense:  60 tablet    Refill:  0    Chronic pain syndrome   tapentadol HCl (NUCYNTA) 75 MG tablet    Sig: Take 1 tablet (75 mg total) by mouth 2 (two) times daily as needed for moderate pain.    Dispense:  60 tablet    Refill:  0  Chronic pain syndrome   Orders:  Orders Placed This Encounter  Procedures   ToxASSURE Select 13 (MW), Urine    Volume: 30 ml(s). Minimum 3 ml of urine is needed. Document temperature of fresh sample. Indications: Long term (current) use of opiate analgesic [phone])    Order Specific Question:   Release to patient    Answer:   Immediate   Follow-up plan:   Return in about 3 months (around 04/10/2022) for Medication Management, in person.    Recent Visits No visits were found meeting these conditions. Showing recent visits within past 90 days and meeting all other requirements Today's Visits Date Type Provider Dept  01/08/22 Office Visit [name], MD [facility]  Showing today's visits and meeting all other requirements Future Appointments Date Type Provider Dept  04/02/22 Appointment [name], MD [facility]  Showing future appointments within next 90 days and  meeting all other requirements  I discussed the assessment and treatment plan with the patient. The patient was provided an opportunity to ask questions and all were answered. The patient agreed with the plan and demonstrated an understanding of the instructions.  Patient advised to call back or seek an in-person evaluation if the symptoms or condition worsens.  Duration of encounter: 49mnutes.  Total time on encounter, as per AMA guidelines included both the face-to-face and non-face-to-face time personally spent by the physician and/or other qualified health care professional(s) on the day of the encounter (includes time in activities that require the physician or other qualified health care professional and does not include time in activities normally performed by clinical staff). Physician's time may include the following activities when performed: preparing to see the patient (eg, review of tests, pre-charting review of records) obtaining and/or reviewing separately obtained history performing a medically appropriate examination and/or evaluation counseling and educating the patient/family/caregiver ordering medications, tests, or procedures referring and communicating with other health care professionals (when not separately reported) documenting clinical information in the electronic or other health record independently interpreting results (not separately reported) and communicating results to the patient/ family/caregiver care coordination (not separately reported)  Note by: BGillis Santa MD Date: 01/08/2022; Time: 9:26 AM

## 2022-01-13 LAB — TOXASSURE SELECT 13 (MW), URINE
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
## 2022-02-05 ENCOUNTER — Ambulatory Visit: Payer: Federal, State, Local not specified - PPO | Admitting: Gastroenterology

## 2022-02-05 ENCOUNTER — Encounter: Payer: Self-pay | Admitting: Gastroenterology

## 2022-02-05 ENCOUNTER — Encounter: Payer: Self-pay | Admitting: Family Medicine

## 2022-02-05 ENCOUNTER — Ambulatory Visit (INDEPENDENT_AMBULATORY_CARE_PROVIDER_SITE_OTHER): Payer: Federal, State, Local not specified - PPO | Admitting: Family Medicine

## 2022-02-05 VITALS — BP 111/80 | HR 88 | Temp 98.6°F | Ht 65.0 in | Wt 193.1 lb

## 2022-02-05 VITALS — BP 124/86 | HR 96 | Resp 16 | Ht 65.0 in | Wt 194.0 lb

## 2022-02-05 DIAGNOSIS — F411 Generalized anxiety disorder: Secondary | ICD-10-CM

## 2022-02-05 DIAGNOSIS — Z23 Encounter for immunization: Secondary | ICD-10-CM | POA: Diagnosis not present

## 2022-02-05 DIAGNOSIS — K581 Irritable bowel syndrome with constipation: Secondary | ICD-10-CM | POA: Diagnosis not present

## 2022-02-05 DIAGNOSIS — R7303 Prediabetes: Secondary | ICD-10-CM

## 2022-02-05 DIAGNOSIS — E8881 Metabolic syndrome: Secondary | ICD-10-CM

## 2022-02-05 DIAGNOSIS — F325 Major depressive disorder, single episode, in full remission: Secondary | ICD-10-CM

## 2022-02-05 DIAGNOSIS — Z8673 Personal history of transient ischemic attack (TIA), and cerebral infarction without residual deficits: Secondary | ICD-10-CM | POA: Insufficient documentation

## 2022-02-05 DIAGNOSIS — I10 Essential (primary) hypertension: Secondary | ICD-10-CM

## 2022-02-05 DIAGNOSIS — G4700 Insomnia, unspecified: Secondary | ICD-10-CM

## 2022-02-05 DIAGNOSIS — K1121 Acute sialoadenitis: Secondary | ICD-10-CM

## 2022-02-05 DIAGNOSIS — G894 Chronic pain syndrome: Secondary | ICD-10-CM

## 2022-02-05 MED ORDER — VALSARTAN-HYDROCHLOROTHIAZIDE 80-12.5 MG PO TABS: 1.0000 | ORAL_TABLET | Freq: Every day | ORAL | 3 refills | Status: DC

## 2022-02-05 MED ORDER — LUBIPROSTONE 24 MCG PO CAPS: 24.0000 ug | ORAL_CAPSULE | Freq: Two times a day (BID) | ORAL | 0 refills | Status: DC

## 2022-02-05 MED ORDER — OZEMPIC (0.25 OR 0.5 MG/DOSE) 2 MG/3ML ~~LOC~~ SOPN
0.2500 mg | PEN_INJECTOR | SUBCUTANEOUS | 0 refills | Status: DC
Start: 2022-02-05 — End: 2022-05-15

## 2022-02-05 NOTE — Progress Notes (Signed)
Cephas Darby, MD [address]  [place]  [place], [place] [address]  Main: [phone]  Fax: [phone]    Gastroenterology Consultation  Referring Provider:     Steele Sizer, MD Primary Care Physician:  Steele Sizer, MD Primary Gastroenterologist:  Dr. Cephas Darby Reason for Consultation:     Chronic diarrhea, abdominal discomfort        HPI:   Stacey Stanley is a 46 y.o. female referred by Dr. Steele Sizer, MD  for consultation & management of chronic diarrhea.  Patient reports that since 07/2020, patient has been experiencing several bouts of watery diarrhea as well as epigastric/right upper quadrant pain associated with nausea.  Patient has been diagnosed with symptomatic cholelithiasis, underwent laparoscopic cholecystectomy by Dr. Celine Ahr on 12/13/2020.  Patient reports that her upper abdominal discomfort has not completely resolved since cholecystectomy.  She has been suffering from alternating diarrhea and constipation.  Diarrhea has been her main concern which has worsened since cholecystectomy.  Patient was started on cholestyramine 3 times a day which led to severe constipation.  She also has issues with rectal sphincter since she had hemorrhoidectomy last year.  Patient reports that she has significant amount of stress in her professional life.  She is a Marine scientist at the [employer].  She states that her sleep is not adequate.  Patient underwent CT abdomen and pelvis with no acute intra-abdominal pathology.  Patient does have history of chronic pelvic pain from endometriosis, had history of laparoscopic hysterectomy and laparoscopic resection of endometriosis.  Patient's weight has been stable.  Patient tried Linzess and Trulance for constipation which did not help.  Her labs including CBC, CMP, TSH are unremarkable. Patient does not smoke or drink alcohol Patient is accompanied by her husband today  Follow-up visit 10/01/2021 Patient reports that for last 1  week she has been experiencing epigastric pain, worse postprandial and during eating associated with nausea but no vomiting.  She also reports that her stools are pale brown and when she sometimes.  She took cholestyramine yesterday which made her feel somewhat better.  She has these intermittent flareup of the symptoms.  She was feeling well until a week ago and was able to gain some weight, almost 10 pounds.  She is on chronic opioid use and smokes tobacco.  She underwent upper endoscopy and colonoscopy including biopsies which were all unremarkable.  Follow-up visit 02/05/2022 Patient is here for follow-up of her GI symptoms.  She underwent upper endoscopy and colonoscopy in 07/2021 which were unremarkable including biopsies.  Patient reports that she has alternating episodes of constipation and diarrhea but constipation is worse.  She takes Trulance as needed only because it is very expensive for her.  She reports feeling bloated during episodes of constipation only.  She has been gaining weight.  She has been stressed out with her school as well.  NSAIDs: None  Antiplts/Anticoagulants/Anti thrombotics: None  GI Procedures:  Colonoscopy in 03/02/2018 with poor prep  EGD and colonoscopy 07/04/2021 - Normal duodenal bulb and second portion of the duodenum. - Erosive gastropathy with stigmata of recent bleeding. Biopsied. - Normal gastric body. Biopsied. - Normal gastroesophageal junction and esophagus.  - Preparation of the colon was fair. - The examined portion of the ileum was normal. - Four 4 to 5 mm polyps in the rectum, removed with a cold snare. Resected and retrieved. - Normal mucosa in the entire examined colon. Biopsied.  DIAGNOSIS:  A.  STOMACH; COLD  BIOPSY:  - MILD REACTIVE GASTROPATHY.  - NEGATIVE FOR ACTIVE INFLAMMATION, H. PYLORI, INTESTINAL METAPLASIA,  DYSPLASIA, AND MALIGNANCY.   B.  COLON; RANDOM COLD BIOPSY:  - COLONIC MUCOSA WITH INTACT CRYPT ARCHITECTURE.  - NEGATIVE  FOR MICROSCOPIC COLITIS, DYSPLASIA, AND MALIGNANCY.   C.  RECTUM POLYP X 4; COLD SNARE:  - HYPERPLASTIC POLYPS, 5 FRAGMENTS.  - NEGATIVE FOR DYSPLASIA AND MALIGNANCY.   Past Medical History:  Diagnosis Date   Anxiety    no current meds   Arthritis    left knee pain - otc med prn   Chronic insomnia    Depression    Diabetes mellitus without complication ([place])    Dysrhythmia    Home with daughter [name]   Endometriosis    GERD (gastroesophageal reflux disease)    diet control - no meds   Gout    wrist, feet, hands bilateral   Headache(784.0)    hx - last one 2 yrs ago - no meds   History of kidney stones    Hypertension    Metabolic syndrome    Obesity    Right thyroid nodule 2017   benign   Serum calcium elevated    Sleep disorder, circadian, shift work type    SVD (spontaneous vaginal delivery)    x 2   Tachycardia    History - neg cardiac tests-stress related   TIA (transient ischemic attack)     Past Surgical History:  Procedure Laterality Date   ABDOMINAL HYSTERECTOMY  05/21/2016   COLONOSCOPY  2019   COLONOSCOPY WITH PROPOFOL N/A 07/04/2021   Procedure: COLONOSCOPY WITH PROPOFOL;  Surgeon: Lin Landsman, MD;  Location: [facility];  Service: Gastroenterology;  Laterality: N/A;   DILATION AND CURETTAGE OF UTERUS     ESOPHAGOGASTRODUODENOSCOPY (EGD) WITH PROPOFOL N/A 07/04/2021   Procedure: ESOPHAGOGASTRODUODENOSCOPY (EGD) WITH PROPOFOL;  Surgeon: Lin Landsman, MD;  Location: [facility] ENDOSCOPY;  Service: Gastroenterology;  Laterality: N/A;   EYE SURGERY  06/28/2013   right laser eye surgery - repair retina   HEMORROIDECTOMY  04/04/2018   Dr. Levin Bacon    HYSTEROSCOPY WITH D & C N/A 07/19/2013   Procedure: DILATATION AND CURETTAGE Pollyann Glen;  Surgeon: Marvene Staff, MD;  Location: [facility];  Service: Gynecology;  Laterality: N/A;   INDUCED ABORTION     LAPAROSCOPY  1992   endometriosis   LAPAROSCOPY N/A 07/19/2013   Procedure:  LAPAROSCOPY DIAGNOSTIC  with resection of endometriosis;  Surgeon: Marvene Staff, MD;  Location: [facility];  Service: Gynecology;  Laterality: N/A;   metatarsil   2003   left foot surgery    RECTAL SURGERY  08/15/2021   Current Outpatient Medications:    acetaminophen (TYLENOL) 500 MG tablet, Take 500 mg by mouth every 6 (six) hours as needed for mild pain., Disp: , Rfl:    aspirin 81 MG chewable tablet, Chew 1 tablet (81 mg total) by mouth daily., Disp: 90 tablet, Rfl: 0   Dapsone 5 % topical gel, Apply 60 g topically daily., Disp: , Rfl:    hydrOXYzine (ATARAX) 10 MG tablet, Take 1-2 tablets (10-20 mg total) by mouth at bedtime., Disp: 90 tablet, Rfl: 0   ibuprofen (ADVIL) 200 MG tablet, Take 200 mg by mouth every 6 (six) hours as needed for mild pain., Disp: , Rfl:    imiquimod (ALDARA) 5 % cream, Apply 1 packet topically daily as needed (warts)., Disp: , Rfl:    lubiprostone (AMITIZA) 24 MCG capsule, Take 1 capsule (  24 mcg total) by mouth 2 (two) times daily with a meal., Disp: 60 capsule, Rfl: 0   Omega-3 Fatty Acids (FISH OIL) 1000 MG CAPS, Take 2,000 mg by mouth daily., Disp: , Rfl:    ondansetron (ZOFRAN) 4 MG tablet, Take 4 mg by mouth every 8 (eight) hours as needed for nausea or vomiting., Disp: , Rfl:    Semaglutide,0.25 or 0.'5MG'$ /DOS, (OZEMPIC, 0.25 OR 0.5 MG/DOSE,) 2 MG/3ML SOPN, Inject 0.25-0.5 mg into the skin once a week., Disp: 9 mL, Rfl: 0   tapentadol HCl (NUCYNTA) 75 MG tablet, Take 1 tablet (75 mg total) by mouth 2 (two) times daily as needed for moderate pain., Disp: 60 tablet, Rfl: 0   TRI-LUMA 0.01-4-0.05 % CREA, Apply 30 mg topically daily., Disp: , Rfl:    TRULANCE 3 MG TABS, Take 1 tablet by mouth daily as needed (constipation)., Disp: , Rfl:    valsartan-hydrochlorothiazide (DIOVAN-HCT) 80-12.5 MG tablet, Take 1 tablet by mouth daily., Disp: 90 tablet, Rfl: 3   [START ON 02/20/2022] tapentadol HCl (NUCYNTA) 75 MG tablet, Take 1 tablet (75 mg total) by mouth 2 (two)  times daily as needed for moderate pain. (Patient not taking: Reported on 02/05/2022), Disp: 60 tablet, Rfl: 0   [START ON 03/22/2022] tapentadol HCl (NUCYNTA) 75 MG tablet, Take 1 tablet (75 mg total) by mouth 2 (two) times daily as needed for moderate pain. (Patient not taking: Reported on 02/05/2022), Disp: 60 tablet, Rfl: 0    Family History  Problem Relation Age of Onset   Diabetes Mother    Hypertension Mother    Hypertension Father    Kidney disease Father    Stroke Maternal Grandmother    Leukemia Paternal Grandmother    Alzheimer's disease Paternal Grandfather    Heart disease Brother    Anesthesia problems Neg Hx    Hypotension Neg Hx    Malignant hyperthermia Neg Hx    Pseudochol deficiency Neg Hx      Social History   Tobacco Use   Smoking status: Former    Packs/day: 0.50    Years: 25.00    Total pack years: 12.50    Types: Cigarettes    Start date: 11/25/1993   Smokeless tobacco: Never  Vaping Use   Vaping Use: Former   Start date: 06/01/2014   Quit date: 06/02/2015  Substance Use Topics   Alcohol use: No    Alcohol/week: 0.0 standard drinks of alcohol   Drug use: No    Allergies as of 02/05/2022 - Review Complete 02/05/2022  Allergen Reaction Noted   Nuvigil [armodafinil]  07/27/2017   Ppd [tuberculin purified protein derivative]  12/13/2020    Review of Systems:    All systems reviewed and negative except where noted in HPI.   Physical Exam:  BP 111/80 (BP Location: Left Arm, Patient Position: Sitting, Cuff Size: Normal)   Pulse 88   Temp 98.6 F (37 C) (Oral)   Ht '5\' 5"'$  (1.651 m)   Wt 193 lb 2 oz (87.6 kg)   LMP 07/09/2015 (Exact Date)   BMI 32.14 kg/m  Patient's last menstrual period was 07/09/2015 (exact date).  General:   Alert,  Well-developed, well-nourished, pleasant and cooperative in NAD Head:  Normocephalic and atraumatic. Eyes:  Sclera clear, no icterus.   Conjunctiva pink. Ears:  Normal auditory acuity. Nose:  No deformity,  discharge, or lesions. Mouth:  No deformity or lesions,oropharynx pink & moist. Neck:  Supple; no masses or thyromegaly. Lungs:  Respirations even and  unlabored.  Clear throughout to auscultation.   No wheezes, crackles, or rhonchi. No acute distress. Heart:  Regular rate and rhythm; no murmurs, clicks, rubs, or gallops. Abdomen:  Normal bowel sounds. Soft, nontender, nondistended, without masses, hepatosplenomegaly or hernias noted.  No guarding or rebound tenderness.   Rectal: Not performed Msk:  Symmetrical without gross deformities. Good, equal movement & strength bilaterally. Pulses:  Normal pulses noted. Extremities:  No clubbing or edema.  No cyanosis. Neurologic:  Alert and oriented x3;  grossly normal neurologically. Skin:  Intact without significant lesions or rashes. No jaundice. Lymph Nodes:  No significant cervical adenopathy. Psych:  Alert and cooperative. Normal mood and affect.  Imaging Studies: Reviewed  Assessment and Plan:   Stacey Stanley is a 46 y.o. female with history of anxiety, chronic endometriosis, prediabetic, s/p cholecystectomy in 11/2020 secondary to symptomatic cholelithiasis, history of chronic tobacco use, opioid use is seen in consultation for follow-up of nonbloody loose stools, with abdominal bloating and discomfort as well as chronic epigastric pain.  Epigastric pain has improved.  EGD was unremarkable.  Patient could not undergo gastric emptying study due to her school schedule.  Her main concern today is constipation associated with abdominal bloating for which she takes Trulance as needed only because of the cost.  She is willing to try Amitiza.  Linzess resulted in severe cramps including the lowest dose. Work-up including EGD and colonoscopy were unremarkable, stool studies were negative for infection, TSH normal We will try Amitiza 24 mcg 1-2 times daily, prescription sent  Follow up in as needed and contact via MyChart as needed   Cephas Darby, MD

## 2022-02-10 ENCOUNTER — Telehealth: Payer: Self-pay

## 2022-02-10 NOTE — Telephone Encounter (Signed)
Pa submitted through cover my meds for Lubiprostone 32mg.  Waiting on response from insurance company

## 2022-02-19 ENCOUNTER — Telehealth: Payer: Self-pay | Admitting: Student in an Organized Health Care Education/Training Program

## 2022-02-19 ENCOUNTER — Other Ambulatory Visit: Payer: Self-pay | Admitting: *Deleted

## 2022-02-19 DIAGNOSIS — G894 Chronic pain syndrome: Secondary | ICD-10-CM

## 2022-02-19 MED ORDER — TAPENTADOL HCL 75 MG PO TABS: 75.0000 mg | ORAL_TABLET | Freq: Two times a day (BID) | ORAL | 0 refills | Status: AC | PRN

## 2022-02-19 NOTE — Telephone Encounter (Signed)
Rx request sent to Dr. Lateef 

## 2022-02-19 NOTE — Telephone Encounter (Signed)
PT stated that she found an pharmacy, [facility] in [place] on [address]. PT will like for her prescription to be refilled. Number is [phone]. Thanks

## 2022-02-19 NOTE — Telephone Encounter (Signed)
Patient states she has contacted several pharmacies, none that she has spoken with has [place] in [place]. I advised her to continue to call around, that there have been numerous shortages of opioids this year.

## 2022-02-19 NOTE — Telephone Encounter (Signed)
Please call patient regarding Nucynta. She is having hard time finding pharmacy to fill this.

## 2022-03-03 ENCOUNTER — Telehealth: Payer: Self-pay

## 2022-03-03 NOTE — Patient Outreach (Signed)
  Care Coordination   03/03/2022 Name: Stacey Stanley MRN: [id number] DOB: May 04, 1976   Care Coordination Outreach Attempts:  An unsuccessful telephone outreach was attempted today to offer the patient information about available care coordination services as a benefit of their health plan.   Follow Up Plan:  Additional outreach attempts will be made to offer the patient care coordination information and services.   Encounter Outcome:  No Answer  Care Coordination Interventions Activated:  No   Care Coordination Interventions:  No, not indicated    Noreene Larsson RN, MSN, [facility]  Mobile: [phone]

## 2022-03-16 DIAGNOSIS — L02828 Furuncle of other sites: Secondary | ICD-10-CM | POA: Diagnosis not present

## 2022-04-01 ENCOUNTER — Encounter (HOSPITAL_BASED_OUTPATIENT_CLINIC_OR_DEPARTMENT_OTHER): Payer: Self-pay | Admitting: Emergency Medicine

## 2022-04-01 ENCOUNTER — Emergency Department (HOSPITAL_BASED_OUTPATIENT_CLINIC_OR_DEPARTMENT_OTHER)
Admission: EM | Admit: 2022-04-01 | Discharge: 2022-04-01 | Disposition: A | Discharge status: Home / Self Care | Payer: Federal, State, Local not specified - PPO | Attending: Emergency Medicine | Admitting: Emergency Medicine

## 2022-04-01 ENCOUNTER — Other Ambulatory Visit (INDEPENDENT_AMBULATORY_CARE_PROVIDER_SITE_OTHER): Payer: Federal, State, Local not specified - PPO

## 2022-04-01 ENCOUNTER — Encounter: Payer: Self-pay | Admitting: Neurology

## 2022-04-01 ENCOUNTER — Ambulatory Visit: Payer: Federal, State, Local not specified - PPO | Admitting: Neurology

## 2022-04-01 ENCOUNTER — Emergency Department (HOSPITAL_BASED_OUTPATIENT_CLINIC_OR_DEPARTMENT_OTHER): Payer: Federal, State, Local not specified - PPO

## 2022-04-01 ENCOUNTER — Other Ambulatory Visit: Payer: Self-pay

## 2022-04-01 VITALS — BP 101/71 | HR 88 | Ht 65.0 in | Wt 189.0 lb

## 2022-04-01 DIAGNOSIS — I10 Essential (primary) hypertension: Secondary | ICD-10-CM | POA: Insufficient documentation

## 2022-04-01 DIAGNOSIS — R1032 Left lower quadrant pain: Secondary | ICD-10-CM | POA: Insufficient documentation

## 2022-04-01 DIAGNOSIS — Z87891 Personal history of nicotine dependence: Secondary | ICD-10-CM | POA: Diagnosis not present

## 2022-04-01 DIAGNOSIS — R200 Anesthesia of skin: Secondary | ICD-10-CM

## 2022-04-01 DIAGNOSIS — E119 Type 2 diabetes mellitus without complications: Secondary | ICD-10-CM | POA: Insufficient documentation

## 2022-04-01 DIAGNOSIS — R202 Paresthesia of skin: Secondary | ICD-10-CM

## 2022-04-01 DIAGNOSIS — Z7982 Long term (current) use of aspirin: Secondary | ICD-10-CM | POA: Diagnosis not present

## 2022-04-01 DIAGNOSIS — Z87442 Personal history of urinary calculi: Secondary | ICD-10-CM | POA: Insufficient documentation

## 2022-04-01 DIAGNOSIS — R208 Other disturbances of skin sensation: Secondary | ICD-10-CM

## 2022-04-01 DIAGNOSIS — R109 Unspecified abdominal pain: Secondary | ICD-10-CM

## 2022-04-01 LAB — URINALYSIS, ROUTINE W REFLEX MICROSCOPIC
Bilirubin Urine: NEGATIVE
Glucose, UA: NEGATIVE mg/dL
Hgb urine dipstick: NEGATIVE
Ketones, ur: NEGATIVE mg/dL
Leukocytes,Ua: NEGATIVE
Nitrite: NEGATIVE
Protein, ur: NEGATIVE mg/dL
Specific Gravity, Urine: 1.01 (ref 1.005–1.030)
pH: 7 (ref 5.0–8.0)

## 2022-04-01 LAB — CBC
HCT: 43.5 % (ref 36.0–46.0)
Hemoglobin: 14.1 g/dL (ref 12.0–15.0)
MCH: 28.5 pg (ref 26.0–34.0)
MCHC: 32.4 g/dL (ref 30.0–36.0)
MCV: 88.1 fL (ref 80.0–100.0)
Platelets: 326 10*3/uL (ref 150–400)
RBC: 4.94 MIL/uL (ref 3.87–5.11)
RDW: 13 % (ref 11.5–15.5)
WBC: 6.3 10*3/uL (ref 4.0–10.5)
nRBC: 0 % (ref 0.0–0.2)

## 2022-04-01 LAB — COMPREHENSIVE METABOLIC PANEL
ALT: 8 U/L (ref 0–44)
AST: 14 U/L — ABNORMAL LOW (ref 15–41)
Albumin: 4.6 g/dL (ref 3.5–5.0)
Alkaline Phosphatase: 55 U/L (ref 38–126)
Anion gap: 8 (ref 5–15)
BUN: 9 mg/dL (ref 6–20)
CO2: 29 mmol/L (ref 22–32)
Calcium: 10.5 mg/dL — ABNORMAL HIGH (ref 8.9–10.3)
Chloride: 103 mmol/L (ref 98–111)
Creatinine, Ser: 0.88 mg/dL (ref 0.44–1.00)
GFR, Estimated: >60 mL/min (ref >60)
Glucose, Bld: 83 mg/dL (ref 70–99)
Potassium: 3.8 mmol/L (ref 3.5–5.1)
Sodium: 140 mmol/L (ref 135–145)
Total Bilirubin: 0.6 mg/dL (ref 0.3–1.2)
Total Protein: 7.2 g/dL (ref 6.5–8.1)

## 2022-04-01 LAB — LIPASE, BLOOD: Lipase: 13 U/L (ref 11–51)

## 2022-04-01 MED ORDER — IOHEXOL 300 MG/ML  SOLN
100.0000 mL | Freq: Once | INTRAMUSCULAR | Status: AC | PRN
  Administered 2022-04-01: 80 mL via INTRAVENOUS

## 2022-04-01 MED ORDER — SODIUM CHLORIDE 0.9 % IV BOLUS
1000.0000 mL | Freq: Once | INTRAVENOUS | Status: AC
  Administered 2022-04-01: 1000 mL via INTRAVENOUS

## 2022-04-01 MED ORDER — ONDANSETRON HCL 4 MG/2ML IJ SOLN
4.0000 mg | Freq: Once | INTRAMUSCULAR | Status: AC
  Administered 2022-04-01: 4 mg via INTRAVENOUS
  Filled 2022-04-01: qty 2

## 2022-04-01 MED ORDER — HYDROMORPHONE HCL 1 MG/ML IJ SOLN
1.0000 mg | Freq: Once | INTRAMUSCULAR | Status: AC
  Administered 2022-04-01: 1 mg via INTRAVENOUS
  Filled 2022-04-01: qty 1

## 2022-04-01 NOTE — ED Provider Notes (Signed)
[facility] EMERGENCY DEPT Provider Note   CSN: [id number] Arrival date & time: 04/01/22  1836     History  Chief Complaint  Patient presents with   Abdominal Pain    ERMAL BRZOZOWSKI is a 46 y.o. female.  Patient as above with significant medical history as below, including chronic abdominal pain, endometriosis, nephrolithiasis, HTN, DM who presents to the ED with complaint of lower quad abdominal pain.  Symptoms began 2 days ago, localized to left flank, left lower quadrant.  Gradually worsening.  Constant.  Not associate with nausea or vomiting.  No change in bowel or bladder function.  She is chronically constipated, unchanged baseline.  No fevers or chills.  No chest pain or dyspnea.  No change to urination.  No abnormal vaginal bleeding or discharge.  Similar symptoms in the past associated with ovarian cyst.  Unable to see PCP     Past Medical History:  Diagnosis Date   Anxiety    no current meds   Arthritis    left knee pain - otc med prn   Chronic insomnia    Depression    Diabetes mellitus without complication ([place])    Dysrhythmia    Home with daughter [name]   Endometriosis    GERD (gastroesophageal reflux disease)    diet control - no meds   Gout    wrist, feet, hands bilateral   Headache(784.0)    hx - last one 2 yrs ago - no meds   History of kidney stones    Hypertension    Metabolic syndrome    Obesity    Right thyroid nodule 2017   benign   Serum calcium elevated    Sleep disorder, circadian, shift work type    SVD (spontaneous vaginal delivery)    x 2   Tachycardia    History - neg cardiac tests-stress related   TIA (transient ischemic attack)     Past Surgical History:  Procedure Laterality Date   ABDOMINAL HYSTERECTOMY  05/21/2016   COLONOSCOPY  2019   COLONOSCOPY WITH PROPOFOL N/A 07/04/2021   Procedure: COLONOSCOPY WITH PROPOFOL;  Surgeon: Lin Landsman, MD;  Location: [facility];  Service:  Gastroenterology;  Laterality: N/A;   DILATION AND CURETTAGE OF UTERUS     ESOPHAGOGASTRODUODENOSCOPY (EGD) WITH PROPOFOL N/A 07/04/2021   Procedure: ESOPHAGOGASTRODUODENOSCOPY (EGD) WITH PROPOFOL;  Surgeon: Lin Landsman, MD;  Location: [facility] ENDOSCOPY;  Service: Gastroenterology;  Laterality: N/A;   EYE SURGERY  06/28/2013   right laser eye surgery - repair retina   HEMORROIDECTOMY  04/04/2018   Dr. Levin Bacon    HYSTEROSCOPY WITH D & C N/A 07/19/2013   Procedure: DILATATION AND CURETTAGE [place];  Surgeon: Marvene Staff, MD;  Location: [facility];  Service: Gynecology;  Laterality: N/A;   INDUCED ABORTION     LAPAROSCOPY  1992   endometriosis   LAPAROSCOPY N/A 07/19/2013   Procedure: LAPAROSCOPY DIAGNOSTIC  with resection of endometriosis;  Surgeon: Marvene Staff, MD;  Location: [facility];  Service: Gynecology;  Laterality: N/A;   metatarsil   2003   left foot surgery    RECTAL SURGERY  08/15/2021     The history is provided by the patient. No language interpreter was used.  Abdominal Pain Associated symptoms: no chest pain, no cough, no dysuria, no fever, no nausea and no shortness of breath        Home Medications Prior to Admission medications   Medication Sig Start Date End Date Taking?  Authorizing Provider  acetaminophen (TYLENOL) 500 MG tablet Take 500 mg by mouth every 6 (six) hours as needed for mild pain.    [provider]  aspirin 81 MG chewable tablet Chew 1 tablet (81 mg total) by mouth daily. 12/12/21   Evlyn Courier, PA-C  Dapsone 5 % topical gel Apply 60 g topically daily. 09/21/19   [provider]  hydrOXYzine (ATARAX) 10 MG tablet Take 1-2 tablets (10-20 mg total) by mouth at bedtime. 11/13/21   Steele Sizer, MD  ibuprofen (ADVIL) 200 MG tablet Take 200 mg by mouth every 6 (six) hours as needed for mild pain.    [provider]  imiquimod (ALDARA) 5 % cream Apply 1 packet topically daily as needed ([place]). 09/21/19    [provider]  lubiprostone (AMITIZA) 24 MCG capsule Take 1 capsule (24 mcg total) by mouth 2 (two) times daily with a meal. 02/05/22   Vanga, Tally Due, MD  Omega-3 Fatty Acids (FISH OIL) 1000 MG CAPS Take 2,000 mg by mouth daily.    [provider]  ondansetron (ZOFRAN) 4 MG tablet Take 4 mg by mouth every 8 (eight) hours as needed for nausea or vomiting. 02/13/21   [provider]  Semaglutide,0.25 or 0.'5MG'$ /DOS, (OZEMPIC, 0.25 OR 0.5 MG/DOSE,) 2 MG/3ML SOPN Inject 0.25-0.5 mg into the skin once a week. 02/05/22   Steele Sizer, MD  tapentadol HCl (NUCYNTA) 75 MG tablet Take 1 tablet (75 mg total) by mouth 2 (two) times daily as needed for moderate pain. 03/22/22 04/21/22  [name], MD  TRI-LUMA 0.01-4-0.05 % CREA Apply 30 mg topically daily. 09/21/19   [provider]  TRULANCE 3 MG TABS Take 1 tablet by mouth daily as needed (constipation). 09/29/21   [provider]  valsartan-hydrochlorothiazide (DIOVAN-HCT) 80-12.5 MG tablet Take 1 tablet by mouth daily. 02/05/22   Steele Sizer, MD      Allergies    Nuvigil [armodafinil] and Ppd [tuberculin purified protein derivative]    Review of Systems   Review of Systems  Constitutional:  Negative for activity change and fever.  HENT:  Negative for facial swelling and trouble swallowing.   Eyes:  Negative for discharge and redness.  Respiratory:  Negative for cough and shortness of breath.   Cardiovascular:  Negative for chest pain and palpitations.  Gastrointestinal:  Positive for abdominal pain. Negative for nausea.  Genitourinary:  Negative for dysuria and flank pain.  Musculoskeletal:  Negative for back pain and gait problem.  Skin:  Negative for pallor and rash.  Neurological:  Negative for syncope and headaches.    Physical Exam Updated Vital Signs BP 122/80 (BP Location: Right Arm)   Pulse 81   Temp 98.9 F (37.2 C) (Oral)   Resp 16   LMP 07/09/2015 (Exact Date)   SpO2 100%   Physical Exam Vitals and nursing note reviewed.  Constitutional:      General: She is not in acute distress.    Appearance: Normal appearance. She is well-developed.  HENT:     Head: Normocephalic and atraumatic.     Right Ear: External ear normal.     Left Ear: External ear normal.     Nose: Nose normal.     Mouth/Throat:     Mouth: Mucous membranes are moist.  Eyes:     General: No scleral icterus.       Right eye: No discharge.        Left eye: No discharge.  Cardiovascular:  Rate and Rhythm: Normal rate and regular rhythm.     Pulses: Normal pulses.     Heart sounds: Normal heart sounds.  Pulmonary:     Effort: Pulmonary effort is normal. No respiratory distress.     Breath sounds: Normal breath sounds.  Abdominal:     General: Abdomen is flat.     Palpations: Abdomen is soft.     Tenderness: There is abdominal tenderness. There is no guarding or rebound.     Comments: Not peritoneal  Musculoskeletal:        General: Normal range of motion.     Cervical back: Normal range of motion.     Right lower leg: No edema.     Left lower leg: No edema.  Skin:    General: Skin is warm and dry.     Capillary Refill: Capillary refill takes less than 2 seconds.  Neurological:     Mental Status: She is alert.  Psychiatric:        Mood and Affect: Mood normal.        Behavior: Behavior normal.     ED Results / Procedures / Treatments   Labs (all labs ordered are listed, but only abnormal results are displayed) Labs Reviewed  COMPREHENSIVE METABOLIC PANEL - Abnormal; Notable for the following components:      Result Value   Calcium 10.5 (*)    AST 14 (*)    All other components within normal limits  URINALYSIS, ROUTINE W REFLEX MICROSCOPIC - Abnormal; Notable for the following components:   Color, Urine COLORLESS (*)    All other components within normal limits  LIPASE, BLOOD  CBC    EKG None  Radiology CT ABDOMEN PELVIS W CONTRAST  Result Date:  04/01/2022 CLINICAL DATA:  Left lower quadrant pain EXAM: CT ABDOMEN AND PELVIS WITH CONTRAST TECHNIQUE: Multidetector CT imaging of the abdomen and pelvis was performed using the standard protocol following bolus administration of intravenous contrast. RADIATION DOSE REDUCTION: This exam was performed according to the departmental dose-optimization program which includes automated exposure control, adjustment of the mA and/or kV according to patient size and/or use of iterative reconstruction technique. CONTRAST:  80m OMNIPAQUE IOHEXOL 300 MG/ML  SOLN COMPARISON:  04/29/2021 FINDINGS: Lower chest: No acute abnormality Hepatobiliary: No focal liver abnormality is seen. Status post cholecystectomy. No biliary dilatation. Pancreas: No focal abnormality or ductal dilatation. Spleen: Previously seen hypodensity in the spleen no longer visualized. Normal size. Adrenals/Urinary Tract: 3 mm nonobstructing stone in the midpole of the left kidney. No ureteral stones or hydronephrosis. Adrenal glands and urinary bladder unremarkable. Stomach/Bowel: Normal appendix. Stomach, large and small bowel grossly unremarkable. Vascular/Lymphatic: No evidence of aneurysm or adenopathy. Reproductive: Prior hysterectomy.  No adnexal masses. Other: No free fluid or free air. Musculoskeletal: No acute bony abnormality. IMPRESSION: No acute findings in the abdomen or pelvis. Electronically Signed   By: KRolm BaptiseM.D.   On: 04/01/2022 20:13    Procedures Procedures    Medications Ordered in ED Medications  HYDROmorphone (DILAUDID) injection 1 mg (1 mg Intravenous Given 04/01/22 2009)  ondansetron (ZOFRAN) injection 4 mg (4 mg Intravenous Given 04/01/22 2012)  sodium chloride 0.9 % bolus 1,000 mL ( Intravenous Stopped 04/01/22 2122)  iohexol (OMNIPAQUE) 300 MG/ML solution 100 mL (80 mLs Intravenous Contrast Given 04/01/22 2000)    ED Course/ Medical Decision Making/ A&P  Medical Decision Making Amount  and/or Complexity of Data Reviewed Labs: ordered. Radiology: ordered.  Risk Prescription drug management.   This patient presents to the ED with chief complaint(s) of abd pain llq with pertinent past medical history of chronic pain, endometriosis, ovarian cyst which further complicates the presenting complaint. The complaint involves an extensive differential diagnosis and also carries with it a high risk of complications and morbidity.    The differential diagnosis includes but not limited to Differential diagnosis includes but is not exclusive to ectopic pregnancy, ovarian cyst, ovarian torsion, acute appendicitis, urinary tract infection, endometriosis, bowel obstruction, hernia, colitis, renal colic, gastroenteritis, volvulus etc.  . Serious etiologies were considered.   The initial plan is to screening labs/imaging/analgesics    Additional history obtained: Additional history obtained from  na Records reviewed [place] and prior ED visits, prior labs and imaging, home medications CTATP from 11/22 reviewed  Independent labs interpretation:  The following labs were independently interpreted:  UA w/o infection Lipase wnl, CBC wnl CMP stable  Independent visualization of imaging: - I independently visualized the following imaging with scope of interpretation limited to determining acute life threatening conditions related to emergency care: CTAP, which revealed no acute process  Cardiac monitoring was reviewed and interpreted by myself which shows NSR  Treatment and Reassessment: Dilaudid zofran ivf >> symptoms greatly improved  Consultation: - Consulted or discussed management/test interpretation w/ external professional: na  Consideration for admission or further workup: Admission was considered   Patient is feeling much better.  Labs reviewed and are stable.  Imaging reviewed and stable.  Abdomen is soft, nonperitoneal.  Patient follows closely with pain  clinic, she has appointment with them tomorrow.  She also appointment next week with her OB/GYN.  Unclear etiology of her discomfort today.  She is feeling much better.  Low suspicion for acute emergent condition.  Had this pain recurrently over the past few years.  Discussed strict return precautions.   The patient's overall condition has improved, the patient presents with abdominal pain without signs of peritonitis, or other life-threatening serious etiology. The patient understands that at this time there is no evidence for a more malignant underlying process, but the patient also understands that early in the process of an illness, an emergency department workup can be falsely reassuring. Detailed discussions were had with the patient regarding current findings, and need for close f/u with PCP or on call doctor. The patient appears stable for discharge and has been instructed to return immediately if the symptoms worsen in any way for re-evaluation. Patient verbalized understanding and is in agreement with current care plan.  All questions answered prior to discharge.    Social Determinants of health: Social History   Tobacco Use   Smoking status: Former    Packs/day: 0.50    Years: 25.00    Total pack years: 12.50    Types: Cigarettes    Start date: 11/25/1993   Smokeless tobacco: Never  Vaping Use   Vaping Use: Former   Start date: 06/01/2014   Quit date: 06/02/2015  Substance Use Topics   Alcohol use: No    Alcohol/week: 0.0 standard drinks of alcohol   Drug use: No            Final Clinical Impression(s) / ED Diagnoses Final diagnoses:  Abdominal pain, unspecified abdominal location    Rx / DC Orders ED Discharge Orders     None         Wynona Dove  A, DO 04/01/22 2336

## 2022-04-01 NOTE — ED Triage Notes (Signed)
Left sided lower abdominal pain. "Feels like when I had ovarian cyst in the past, I think its gonna rurpure" Started a few days ago, worse today

## 2022-04-01 NOTE — Discharge Instructions (Addendum)
Please follow-up with your pain management specialist tomorrow Please follow-up with OB/GYN next week  There are many causes of abdominal pain. Most pain is not serious and goes away, but some pain gets worse, changes, or will not go away. Please return to the emergency department or see your doctor right away if you (or your family member) experience any of the following:  1. Pain that gets worse or moves to just one spot.  2. Pain that gets worse if you cough or sneeze.  3. Pain with going over a bump in the road.  4. Pain that does not get better in 24 hours.  5. Inability to keep down liquids (vomiting)-especially if you are making less urine.  6. Fainting.  7. Blood in the vomit or stool.  8. High fever or shaking chills.  9. Swelling of the abdomen.  10. Any new or worsening problem.      Additional Instructions  No alcohol.  No caffeine, aspirin, or cigarettes.   Please return to the emergency department immediately for any new or concerning symptoms, or if you get worse.

## 2022-04-01 NOTE — Patient Instructions (Signed)
Check thyroid level US carotids Nerve testing of the left arm and leg  ELECTROMYOGRAM AND NERVE CONDUCTION STUDIES (EMG/NCS) INSTRUCTIONS  How to Prepare The neurologist conducting the EMG will need to know if you have certain medical conditions. Tell the neurologist and other EMG lab personnel if you: Have a pacemaker or any other electrical medical device Take blood-thinning medications Have hemophilia, a blood-clotting disorder that causes prolonged bleeding Bathing Take a shower or bath shortly before your exam in order to remove oils from your skin. Don't apply lotions or creams before the exam.  What to Expect You'll likely be asked to change into a hospital gown for the procedure and lie down on an examination table. The following explanations can help you understand what will happen during the exam.  Electrodes. The neurologist or a technician places surface electrodes at various locations on your skin depending on where you're experiencing symptoms. Or the neurologist may insert needle electrodes at different sites depending on your symptoms.  Sensations. The electrodes will at times transmit a tiny electrical current that you may feel as a twinge or spasm. The needle electrode may cause discomfort or pain that usually ends shortly after the needle is removed. If you are concerned about discomfort or pain, you may want to talk to the neurologist about taking a short break during the exam.  Instructions. During the needle EMG, the neurologist will assess whether there is any spontaneous electrical activity when the muscle is at rest - activity that isn't present in healthy muscle tissue - and the degree of activity when you slightly contract the muscle.  He or she will give you instructions on resting and contracting a muscle at appropriate times. Depending on what muscles and nerves the neurologist is examining, he or she may ask you to change positions during the exam.  After your  EMG You may experience some temporary, minor bruising where the needle electrode was inserted into your muscle. This bruising should fade within several days. If it persists, contact your primary care doctor.

## 2022-04-01 NOTE — Progress Notes (Signed)
[facility] Clinic Note - Initial Visit   Date: 04/01/2022   Stacey Stanley MRN: [id number] DOB: [birth year]   Dear Dr. Ancil Boozer:  Thank you for your kind referral of Stacey Stanley for consultation of left side numbness/tingling. Although her history is well known to you, please allow [place] to reiterate it for the purpose of our medical record. The patient was accompanied to the clinic by self.    Stacey Stanley is a 46 y.o. 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   IMPRESSION/PLAN: Chronic episodic left face, arm, and leg paresthesias.  Exam is normal.  MRI brain personally viewed and does not show abnormalities to explain her symptoms. Her spells are unlikely TIA/stroke with normal MRI brain.  To further evaluation, I will check the following:  - NCS/EMG of the left arm and leg  - US carotids  - Check TSH  Nonspecific dysesthesias of the legs -  reassurance provided   Further recommendations pending results.  ------------------------------------------------------------- History of present illness: In July 2023, she had left face, arm and leg numbness/tingling which progressed over 2-3 days.  She went to the ER for this and had MRI brain which did not show any acute change.  She was started on dual-antiplatelet therapy for possible TIA and now takes aspirin.  No associated headaches. She continues to have these symptoms 4-5 times per week, which lasts about 6 hours.   She also complains of crawling sensation in the legs which is intermittent throughout the week.  This feels as if there could stray hair on her skin.  These symptoms last a few minutes.  She denies urge to move the legs.  No falls or imbalance.   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     Out-side paper records, electronic  medical record, and images have been reviewed where available and summarized as:  MRI brain wo contrast 12/12/2021:  Artifact (presumably from braces) limits assessment without visible evidence of acute intracranial abnormality.  Lab Results  Component Value Date   HGBA1C 5.4 11/28/2020   Lab Results  Component Value Date   VITAMINB12 416 08/08/2021   Lab Results  Component Value Date   TSH 1.19 11/28/2020   Lab Results  Component Value Date   ESRSEDRATE 2 05/06/2020    Past Medical History:  Diagnosis Date   Anxiety    no current meds   Arthritis    left knee pain - otc med prn   Chronic insomnia    Depression    Diabetes mellitus without complication ([place])    Dysrhythmia    Home with daughter [name]   Endometriosis    GERD (gastroesophageal reflux disease)    diet control - no meds   Gout    wrist, feet, hands bilateral   Headache(784.0)    hx - last one 2 yrs ago - no meds   History of kidney stones    Hypertension    Metabolic syndrome    Obesity    Right thyroid nodule 2017   benign   Serum calcium elevated    Sleep disorder, circadian, shift work type    SVD (spontaneous vaginal delivery)    x 2   Tachycardia    History - neg cardiac tests-stress related   TIA (transient ischemic attack)     Past Surgical History:  Procedure Laterality Date  ABDOMINAL HYSTERECTOMY  05/21/2016   COLONOSCOPY  2019   COLONOSCOPY WITH PROPOFOL N/A 07/04/2021   Procedure: COLONOSCOPY WITH PROPOFOL;  Surgeon: Lin Landsman, MD;  Location: [facility] ENDOSCOPY;  Service: Gastroenterology;  Laterality: N/A;   DILATION AND CURETTAGE OF UTERUS     ESOPHAGOGASTRODUODENOSCOPY (EGD) WITH PROPOFOL N/A 07/04/2021   Procedure: ESOPHAGOGASTRODUODENOSCOPY (EGD) WITH PROPOFOL;  Surgeon: Lin Landsman, MD;  Location: [facility] ENDOSCOPY;  Service: Gastroenterology;  Laterality: N/A;   EYE SURGERY  06/28/2013   right laser eye surgery - repair retina   HEMORROIDECTOMY   04/04/2018   Dr. Levin Bacon    HYSTEROSCOPY WITH D & C N/A 07/19/2013   Procedure: DILATATION AND CURETTAGE Pollyann Glen;  Surgeon: Marvene Staff, MD;  Location: [facility];  Service: Gynecology;  Laterality: N/A;   INDUCED ABORTION     LAPAROSCOPY  1992   endometriosis   LAPAROSCOPY N/A 07/19/2013   Procedure: LAPAROSCOPY DIAGNOSTIC  with resection of endometriosis;  Surgeon: Marvene Staff, MD;  Location: [facility];  Service: Gynecology;  Laterality: N/A;   metatarsil   2003   left foot surgery    RECTAL SURGERY  08/15/2021     Medications:  Outpatient Encounter Medications as of 04/01/2022  Medication Sig   acetaminophen (TYLENOL) 500 MG tablet Take 500 mg by mouth every 6 (six) hours as needed for mild pain.   aspirin 81 MG chewable tablet Chew 1 tablet (81 mg total) by mouth daily.   Dapsone 5 % topical gel Apply 60 g topically daily.   hydrOXYzine (ATARAX) 10 MG tablet Take 1-2 tablets (10-20 mg total) by mouth at bedtime.   ibuprofen (ADVIL) 200 MG tablet Take 200 mg by mouth every 6 (six) hours as needed for mild pain.   imiquimod (ALDARA) 5 % cream Apply 1 packet topically daily as needed (warts).   lubiprostone (AMITIZA) 24 MCG capsule Take 1 capsule (24 mcg total) by mouth 2 (two) times daily with a meal.   Omega-3 Fatty Acids (FISH OIL) 1000 MG CAPS Take 2,000 mg by mouth daily.   ondansetron (ZOFRAN) 4 MG tablet Take 4 mg by mouth every 8 (eight) hours as needed for nausea or vomiting.   Semaglutide,0.25 or 0.'5MG'$ /DOS, (OZEMPIC, 0.25 OR 0.5 MG/DOSE,) 2 MG/3ML SOPN Inject 0.25-0.5 mg into the skin once a week.   tapentadol HCl (NUCYNTA) 75 MG tablet Take 1 tablet (75 mg total) by mouth 2 (two) times daily as needed for moderate pain.   TRI-LUMA 0.01-4-0.05 % CREA Apply 30 mg topically daily.   TRULANCE 3 MG TABS Take 1 tablet by mouth daily as needed (constipation).   valsartan-hydrochlorothiazide (DIOVAN-HCT) 80-12.5 MG tablet Take 1 tablet by mouth daily.   No  facility-administered encounter medications on file as of 04/01/2022.    Allergies:  Allergies  Allergen Reactions   Nuvigil [Armodafinil]     Chest pain    Ppd [Tuberculin Purified Protein Derivative]     Family History: Family History  Problem Relation Age of Onset   Diabetes Mother    Hypertension Mother    Hypertension Father    Kidney disease Father    Stroke Maternal Grandmother    Leukemia Paternal Grandmother    Alzheimer's disease Paternal Grandfather    Heart disease Brother    Anesthesia problems Neg Hx    Hypotension Neg Hx    Malignant hyperthermia Neg Hx    Pseudochol deficiency Neg Hx     Social History: Social History   Tobacco Use  Smoking status: Former    Packs/day: 0.50    Years: 25.00    Total pack years: 12.50    Types: Cigarettes    Start date: 11/25/1993   Smokeless tobacco: Never  Vaping Use   Vaping Use: Former   Start date: 06/01/2014   Quit date: 06/02/2015  Substance Use Topics   Alcohol use: No    Alcohol/week: 0.0 standard drinks of alcohol   Drug use: No   Social History   Social History Narrative   Married for the past 3 years.    Both had children previously.    They work different [employer]   She works full time at the [employer] and is going to resume NP school Fall 2020      Are you right handed or left handed? Right Handed   Are you currently employed ? Yes   What is your current occupation? Yes works with [employer] you live at home alone? Lives with husband   Who lives with you? Lives with husband    What type of home do you live in: 1 story or 2 story? Lives in one story home        Vital Signs:  BP 101/71   Pulse 88   Ht '5\' 5"'$  (1.651 m)   Wt 189 lb (85.7 kg)   LMP 07/09/2015 (Exact Date)   SpO2 99%   BMI 31.45 kg/m   Neurological Exam: MENTAL STATUS including orientation to time, place, person, recent and remote memory, attention span and concentration, language, and fund of knowledge is normal.  Speech is not  dysarthric.  CRANIAL NERVES: II:  No visual field defects.   III-IV-VI: Pupils equal round and reactive to light.  Normal conjugate, extra-ocular eye movements in all directions of gaze.  No nystagmus.  No ptosis.   V:  Normal facial sensation.    VII:  Normal facial symmetry and movements.   VIII:  Normal hearing and vestibular function.   IX-X:  Normal palatal movement.   XI:  Normal shoulder shrug and head rotation.   XII:  Normal tongue strength and range of motion, no deviation or fasciculation.  MOTOR:  No atrophy, fasciculations or abnormal movements.  No pronator drift.   Upper Extremity:  Right  Left  Deltoid  5/5   5/5   Biceps  5/5   5/5   Triceps  5/5   5/5   Infraspinatus 5/5  5/5  Medial pectoralis 5/5  5/5  Wrist extensors  5/5   5/5   Wrist flexors  5/5   5/5   Finger extensors  5/5   5/5   Finger flexors  5/5   5/5   Dorsal interossei  5/5   5/5   Abductor pollicis  5/5   5/5   Tone (Ashworth scale)  0  0   Lower Extremity:  Right  Left  Hip flexors  5/5   5/5   Hip extensors  5/5   5/5   Adductor 5/5  5/5  Abductor 5/5  5/5  Knee flexors  5/5   5/5   Knee extensors  5/5   5/5   Dorsiflexors  5/5   5/5   Plantarflexors  5/5   5/5   Toe extensors  5/5   5/5   Toe flexors  5/5   5/5   Tone (Ashworth scale)  0  0   MSRs:  Right        Left brachioradialis 2+  2+  biceps 2+  2+  triceps 2+  2+  patellar 2+  2+  ankle jerk 2+  2+  Hoffman no  no  plantar response down  down  No crossed adductors.   No clonus.    SENSORY:  Normal and symmetric perception of light touch, pinprick, vibration, and proprioception.  Romberg's sign absent.   COORDINATION/GAIT: Normal finger-to- nose-finger and heel-to-shin.  Intact rapid alternating movements bilaterally.  Able to rise from a chair without using arms.  Gait narrow based and stable. Tandem and stressed gait intact.     Thank you for allowing me to participate in  patient's care.  If I can answer any additional questions, I would be pleased to do so.    Sincerely,    Anthonella Klausner K. Posey Pronto, DO

## 2022-04-02 ENCOUNTER — Encounter: Payer: Self-pay | Admitting: Student in an Organized Health Care Education/Training Program

## 2022-04-02 ENCOUNTER — Other Ambulatory Visit: Payer: Self-pay

## 2022-04-02 ENCOUNTER — Ambulatory Visit
Payer: Federal, State, Local not specified - PPO | Attending: Student in an Organized Health Care Education/Training Program | Admitting: Student in an Organized Health Care Education/Training Program

## 2022-04-02 VITALS — BP 113/83 | HR 89 | Temp 97.4°F | Resp 18 | Ht 65.0 in | Wt 185.0 lb

## 2022-04-02 DIAGNOSIS — G4700 Insomnia, unspecified: Secondary | ICD-10-CM | POA: Insufficient documentation

## 2022-04-02 DIAGNOSIS — R102 Pelvic and perineal pain: Secondary | ICD-10-CM | POA: Diagnosis not present

## 2022-04-02 DIAGNOSIS — F320 Major depressive disorder, single episode, mild: Secondary | ICD-10-CM | POA: Insufficient documentation

## 2022-04-02 DIAGNOSIS — N809 Endometriosis, unspecified: Secondary | ICD-10-CM | POA: Diagnosis not present

## 2022-04-02 DIAGNOSIS — Z79899 Other long term (current) drug therapy: Secondary | ICD-10-CM | POA: Insufficient documentation

## 2022-04-02 DIAGNOSIS — G894 Chronic pain syndrome: Secondary | ICD-10-CM | POA: Insufficient documentation

## 2022-04-02 DIAGNOSIS — G8929 Other chronic pain: Secondary | ICD-10-CM | POA: Diagnosis not present

## 2022-04-02 MED ORDER — TAPENTADOL HCL 75 MG PO TABS: 75.0000 mg | ORAL_TABLET | Freq: Two times a day (BID) | ORAL | 0 refills | Status: AC | PRN

## 2022-04-02 MED ORDER — TAPENTADOL HCL 75 MG PO TABS: 75.0000 mg | ORAL_TABLET | Freq: Two times a day (BID) | ORAL | 0 refills | Status: DC | PRN

## 2022-04-02 NOTE — Patient Instructions (Signed)

## 2022-04-02 NOTE — Progress Notes (Signed)
Nursing Pain Medication Assessment:  Safety precautions to be maintained throughout the outpatient stay will include: orient to surroundings, keep bed in low position, maintain call bell within reach at all times, provide assistance with transfer out of bed and ambulation.  Medication Inspection Compliance: Pill count conducted under aseptic conditions, in front of the patient. Neither the pills nor the bottle was removed from the patient's sight at any time. Once count was completed pills were immediately returned to the patient in their original bottle.  Medication: Tapentadol (Nucynta) Pill/Patch Count:  33 of 60 pills remain Pill/Patch Appearance: Markings consistent with prescribed medication Bottle Appearance: Standard pharmacy container. Clearly labeled. Filled Date: 59 / 22 / 2023 Last Medication intake:  Yesterday

## 2022-04-02 NOTE — Progress Notes (Signed)
PROVIDER NOTE: Information contained herein reflects review and annotations entered in association with encounter. Interpretation of such information and data should be left to medically-trained personnel. Information provided to patient can be located elsewhere in the medical record under "Patient Instructions". Document created using STT-dictation technology, any transcriptional errors that may result from process are unintentional.    Patient: Stacey Stanley  Service Category: E/M  Provider: [name], MD  DOB: [birth year]  DOS: 04/02/2022  Referring Provider: Steele Sizer, MD  MRN: [id number]  Specialty: Interventional Pain Management  PCP: Steele Sizer, MD  Type: Established Patient  Setting: Ambulatory outpatient    Location: Office  Delivery: Face-to-face     HPI  [name], a 46 y.o. year old female, is here today because of her Chronic pain syndrome [G89.4]. Ms. Lukach primary complain today is Pelvic Pain Last encounter: My last encounter with her was on 01/08/22 Pertinent problems: Ms. Lymon has Endometriosis; Chronic pelvic pain in female; Cervical radiculitis; Chronic pain syndrome; and Opiate use on their pertinent problem list. Pain Assessment: Severity of Chronic pain is reported as a 7 /10. Location: Pelvis Lower, Left/denies. Onset: More than a month ago. Quality: Constant, Aching, Dull, Sharp. Timing: Constant. Modifying factor(s): medications, heat. Vitals:  height is _0  (1.651 m) and weight is 185 lb (83.9 kg). Her temporal temperature is 97.4 F (36.3 C) (abnormal). Her blood pressure is 113/83 and her pulse is 89. Her respiration is 18 and oxygen saturation is 100%.   Reason for encounter: medication management.   Unfortunately, patient had to go to the emergency department yesterday for increased abdominal and pelvic pain that was localized to the left lower quadrant and radiating to the left flank.  Extensive work-up was done in the ED including  UA, blood work including lipase, CT abdomen along with 1 mg of IV Dilaudid.  Work-up was negative.  She states that her severe pain that she was experiencing yesterday is less today although it is higher than her baseline pain.  She plans to see her gynecologist soon.  Otherwise refill Nucynta as below, no change in dose.  UDS up-to-date and appropriate.  I did offer the patient intramuscular Toradol 30 mg however she took ibuprofen 800 mg this morning so I advised against it.  Pharmacotherapy Assessment  Analgesic: Nucynta 75 mg twice daily as needed, quantity 45/month; MME equals 45   Monitoring:  PMP: PDMP reviewed during this encounter.       Pharmacotherapy: No side-effects or adverse reactions reported. Compliance: No problems identified. Effectiveness: Clinically acceptable.  Hart Rochester, RN  04/02/2022  1:24 PM  Sign when Signing Visit Nursing Pain Medication Assessment:  Safety precautions to be maintained throughout the outpatient stay will include: orient to surroundings, keep bed in low position, maintain call bell within reach at all times, provide assistance with transfer out of bed and ambulation.  Medication Inspection Compliance: Pill count conducted under aseptic conditions, in front of the patient. Neither the pills nor the bottle was removed from the patient's sight at any time. Once count was completed pills were immediately returned to the patient in their original bottle.  Medication: Tapentadol (Nucynta) Pill/Patch Count:  33 of 60 pills remain Pill/Patch Appearance: Markings consistent with prescribed medication Bottle Appearance: Standard pharmacy container. Clearly labeled. Filled Date: 25 / 22 / 2023 Last Medication intake:  Yesterday    UDS:  Summary  Date Value Ref Range Status  01/08/2022 Note  Final    Comment:    ====================================================================  [employer] Select 13  (MW) ==================================================================== Test                             Result       Flag       Units  Drug Present and Declared for Prescription Verification   Tapentadol                     [phone]        EXPECTED   ng/mg creat    Source of tapentadol is a scheduled prescription medication.  ==================================================================== Test                      Result    Flag   Units      Ref Range   Creatinine              116              mg/dL      >=20 ==================================================================== Declared Medications:  The flagging and interpretation on this report are based on the  following declared medications.  Unexpected results may arise from  inaccuracies in the declared medications.   **Note: The testing scope of this panel includes these medications:   Tapentadol (Nucynta)   **Note: The testing scope of this panel does not include the  following reported medications:   Acetaminophen (Tylenol)  Aspirin  Clopidogrel (Plavix)  Dapsone  Fish Oil  Fluocinolone  Hydrochlorothiazide (Hydrodiuril)  Hydroxyzine (Atarax)  Ibuprofen (Advil)  Imiquimod (Aldara)  Ondansetron (Zofran)  Plecanatide (Trulance)  Prednisone  Semaglutide (Ozempic)  Tretinoin ==================================================================== For clinical consultation, please call [phone]. ====================================================================      ROS  Constitutional: Denies any fever or chills Gastrointestinal: No reported hemesis, hematochezia, vomiting, or acute GI distress Musculoskeletal:  Left lower quadrant, pelvic and flank pain Neurological: No reported episodes of acute onset apraxia, aphasia, dysarthria, agnosia, amnesia, paralysis, loss of coordination, or loss of consciousness  Medication Review  Dapsone, Fish Oil, Fluocin-Hydroquinone-Tretinoin, Plecanatide,  Semaglutide(0.25 or 0.5MG/DOS), acetaminophen, aspirin, hydrOXYzine, ibuprofen, imiquimod, lubiprostone, ondansetron, tapentadol HCl, and valsartan-hydrochlorothiazide  History Review  Allergy: Ms. Warn is allergic to nuvigil [armodafinil] and ppd [tuberculin purified protein derivative]. Drug: Ms. Feig  reports no history of drug use. Alcohol:  reports no history of alcohol use. Tobacco:  reports that she has quit smoking. Her smoking use included cigarettes. She started smoking about 28 years ago. She has a 12.50 pack-year smoking history. She has never used smokeless tobacco. Social: Ms. Klute  reports that she has quit smoking. Her smoking use included cigarettes. She started smoking about 28 years ago. She has a 12.50 pack-year smoking history. She has never used smokeless tobacco. She reports that she does not drink alcohol and does not use drugs. Medical:  has a past medical history of Anxiety, Arthritis, Chronic insomnia, Depression, Diabetes mellitus without complication ([place]), Dysrhythmia, Endometriosis, GERD (gastroesophageal reflux disease), Gout, Headache(784.0), History of kidney stones, Hypertension, Metabolic syndrome, Obesity, Right thyroid nodule (2017), Serum calcium elevated, Sleep disorder, circadian, shift work type, SVD (spontaneous vaginal delivery), Tachycardia, and TIA (transient ischemic attack). Surgical: Ms. Beets  has a past surgical history that includes metatarsil  (2003); Dilation and curettage of uterus; Induced abortion; laparoscopy (1992); Eye surgery (06/28/2013); laparoscopy (N/A, 07/19/2013); Hysteroscopy with D & C (N/A, 07/19/2013); Abdominal hysterectomy (05/21/2016); Hemorroidectomy (04/04/2018); Colonoscopy (2019); Colonoscopy with propofol (N/A, 07/04/2021); Esophagogastroduodenoscopy (egd) with propofol (N/A, 07/04/2021); and  Rectal surgery (08/15/2021). Family: family history includes Alzheimer's disease in her paternal grandfather; Diabetes in her  mother; Heart disease in her brother; Hypertension in her father and mother; Kidney disease in her father; Leukemia in her paternal grandmother; Stroke in her maternal grandmother.  Laboratory Chemistry Profile   Renal Lab Results  Component Value Date   BUN 9 04/01/2022   CREATININE 0.88 29/51/8841   BCR NOT APPLICABLE 66/10/3014   GFRAA 108 11/28/2020   GFRNONAA >60 04/01/2022    Hepatic Lab Results  Component Value Date   AST 14 (L) 04/01/2022   ALT 8 04/01/2022   ALBUMIN 4.6 04/01/2022   ALKPHOS 55 04/01/2022   AMYLASE 102 (H) 05/22/2016   LIPASE 13 04/01/2022    Electrolytes Lab Results  Component Value Date   NA 140 04/01/2022   K 3.8 04/01/2022   CL 103 04/01/2022   CALCIUM 10.5 (H) 04/01/2022   MG 1.5 09/15/2017    Bone Lab Results  Component Value Date   VD25OH 37 08/08/2021    Inflammation (CRP: Acute Phase) (ESR: Chronic Phase) Lab Results  Component Value Date   CRP 0.2 05/06/2020   ESRSEDRATE 2 05/06/2020         Note: Above Lab results reviewed.  Recent Imaging Review  CT ABDOMEN PELVIS W CONTRAST CLINICAL DATA:  Left lower quadrant pain  EXAM: CT ABDOMEN AND PELVIS WITH CONTRAST  TECHNIQUE: Multidetector CT imaging of the abdomen and pelvis was performed using the standard protocol following bolus administration of intravenous contrast.  RADIATION DOSE REDUCTION: This exam was performed according to the departmental dose-optimization program which includes automated exposure control, adjustment of the mA and/or kV according to patient size and/or use of iterative reconstruction technique.  CONTRAST:  70m OMNIPAQUE IOHEXOL 300 MG/ML  SOLN  COMPARISON:  04/29/2021  FINDINGS: Lower chest: No acute abnormality  Hepatobiliary: No focal liver abnormality is seen. Status post cholecystectomy. No biliary dilatation.  Pancreas: No focal abnormality or ductal dilatation.  Spleen: Previously seen hypodensity in the spleen no  longer visualized. Normal size.  Adrenals/Urinary Tract: 3 mm nonobstructing stone in the midpole of the left kidney. No ureteral stones or hydronephrosis. Adrenal glands and urinary bladder unremarkable.  Stomach/Bowel: Normal appendix. Stomach, large and small bowel grossly unremarkable.  Vascular/Lymphatic: No evidence of aneurysm or adenopathy.  Reproductive: Prior hysterectomy.  No adnexal masses.  Other: No free fluid or free air.  Musculoskeletal: No acute bony abnormality.  IMPRESSION: No acute findings in the abdomen or pelvis.  Electronically Signed   By: KRolm BaptiseM.D.   On: 04/01/2022 20:13 Note: Reviewed        Physical Exam  General appearance: Well nourished, well developed, and well hydrated. In no apparent acute distress Mental status: Alert, oriented x 3 (person, place, & time)       Respiratory: No evidence of acute respiratory distress Eyes: PERLA Vitals: BP 113/83   Pulse 89   Temp (!) 97.4 F (36.3 C) (Temporal)   Resp 18   Ht _0  (1.651 m)   Wt 185 lb (83.9 kg)   LMP 07/09/2015 (Exact Date)   SpO2 100%   BMI 30.79 kg/m  BMI: Estimated body mass index is 30.79 kg/m as calculated from the following:   Height as of this encounter: _1  (1.651 m).   Weight as of this encounter: 185 lb (83.9 kg). Ideal: Ideal body weight: 57 kg (125 lb 10.6 oz) Adjusted ideal body weight: 67.8 kg (149 lb 6.4  oz)  Left lower quadrant, pelvic and flank pain  5 out of 5 strength bilateral upper extremity: Shoulder abduction, elbow flexion, elbow extension, thumb extension. 5 out of 5 strength bilateral lower extremity: Plantar flexion, dorsiflexion, knee flexion, knee extension.   Assessment   Diagnosis Status  1. Chronic pain syndrome   2. Chronic pelvic pain in female   3. Endometriosis   4. Insomnia, persistent   5. Long term prescription benzodiazepine use   6. Mild major depression ([place])    Having a Flare-up Having a Flare-up Persistent     Plan of Care    Ms. MAGENTA SCHMIESING has a current medication list which includes the following long-term medication(s): valsartan-hydrochlorothiazide.  Pharmacotherapy (Medications Ordered): Meds ordered this encounter  Medications   tapentadol HCl (NUCYNTA) 75 MG tablet    Sig: Take 1 tablet (75 mg total) by mouth 2 (two) times daily as needed for moderate pain.    Dispense:  60 tablet    Refill:  0    Chronic pain syndrome   tapentadol HCl (NUCYNTA) 75 MG tablet    Sig: Take 1 tablet (75 mg total) by mouth 2 (two) times daily as needed for moderate pain.    Dispense:  60 tablet    Refill:  0    Chronic pain syndrome   tapentadol HCl (NUCYNTA) 75 MG tablet    Sig: Take 1 tablet (75 mg total) by mouth 2 (two) times daily as needed for moderate pain.    Dispense:  60 tablet    Refill:  0    Chronic pain syndrome   Orders:  No orders of the defined types were placed in this encounter.  Follow-up plan:   Return in about 3 months (around 07/03/2022) for Medication Management, in person.    Recent Visits Date Type Provider Dept  01/08/22 Office Visit [name], MD [facility]  Showing recent visits within past 90 days and meeting all other requirements Today's Visits Date Type Provider Dept  04/02/22 Office Visit [name], MD [facility]  Showing today's visits and meeting all other requirements Future Appointments No visits were found meeting these conditions. Showing future appointments within next 90 days and meeting all other requirements  I discussed the assessment and treatment plan with the patient. The patient was provided an opportunity to ask questions and all were answered. The patient agreed with the plan and demonstrated an understanding of the instructions.  Patient advised to call back or seek an in-person evaluation if the symptoms or condition worsens.  Duration of encounter: 35mnutes.  Total time on encounter, as per AMA  guidelines included both the face-to-face and non-face-to-face time personally spent by the physician and/or other qualified health care professional(s) on the day of the encounter (includes time in activities that require the physician or other qualified health care professional and does not include time in activities normally performed by clinical staff). Physician's time may include the following activities when performed: preparing to see the patient (eg, review of tests, pre-charting review of records) obtaining and/or reviewing separately obtained history performing a medically appropriate examination and/or evaluation counseling and educating the patient/family/caregiver ordering medications, tests, or procedures referring and communicating with other health care professionals (when not separately reported) documenting clinical information in the electronic or other health record independently interpreting results (not separately reported) and communicating results to the patient/ family/caregiver care coordination (not separately reported)  Note by: BGillis Santa MD Date: 04/02/2022; Time: 1:54 PM

## 2022-04-06 ENCOUNTER — Encounter: Payer: Self-pay | Admitting: Family Medicine

## 2022-04-08 ENCOUNTER — Other Ambulatory Visit: Payer: Self-pay | Admitting: Family Medicine

## 2022-04-08 DIAGNOSIS — Z90710 Acquired absence of both cervix and uterus: Secondary | ICD-10-CM | POA: Diagnosis not present

## 2022-04-08 DIAGNOSIS — R102 Pelvic and perineal pain: Secondary | ICD-10-CM | POA: Diagnosis not present

## 2022-04-08 MED ORDER — VALSARTAN 160 MG PO TABS: 160.0000 mg | ORAL_TABLET | Freq: Every day | ORAL | 0 refills | Status: DC
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
## 2022-05-15 ENCOUNTER — Encounter: Payer: Self-pay | Admitting: Family Medicine

## 2022-05-15 ENCOUNTER — Ambulatory Visit: Payer: Federal, State, Local not specified - PPO | Admitting: Family Medicine

## 2022-05-15 ENCOUNTER — Ambulatory Visit: Payer: Self-pay | Admitting: *Deleted

## 2022-05-15 ENCOUNTER — Other Ambulatory Visit: Payer: Self-pay | Admitting: Family Medicine

## 2022-05-15 VITALS — BP 130/80 | HR 90 | Temp 98.1°F | Resp 14 | Ht 65.0 in | Wt 193.9 lb

## 2022-05-15 DIAGNOSIS — E559 Vitamin D deficiency, unspecified: Secondary | ICD-10-CM

## 2022-05-15 DIAGNOSIS — R7303 Prediabetes: Secondary | ICD-10-CM | POA: Diagnosis not present

## 2022-05-15 DIAGNOSIS — G894 Chronic pain syndrome: Secondary | ICD-10-CM

## 2022-05-15 DIAGNOSIS — Z8673 Personal history of transient ischemic attack (TIA), and cerebral infarction without residual deficits: Secondary | ICD-10-CM

## 2022-05-15 DIAGNOSIS — I10 Essential (primary) hypertension: Secondary | ICD-10-CM | POA: Diagnosis not present

## 2022-05-15 DIAGNOSIS — R102 Pelvic and perineal pain: Secondary | ICD-10-CM

## 2022-05-15 DIAGNOSIS — E538 Deficiency of other specified B group vitamins: Secondary | ICD-10-CM

## 2022-05-15 DIAGNOSIS — G4700 Insomnia, unspecified: Secondary | ICD-10-CM | POA: Diagnosis not present

## 2022-05-15 DIAGNOSIS — K582 Mixed irritable bowel syndrome: Secondary | ICD-10-CM

## 2022-05-15 DIAGNOSIS — G8929 Other chronic pain: Secondary | ICD-10-CM

## 2022-05-15 DIAGNOSIS — F325 Major depressive disorder, single episode, in full remission: Secondary | ICD-10-CM

## 2022-05-15 MED ORDER — HYDROXYZINE HCL 10 MG PO TABS: 10.0000 mg | ORAL_TABLET | Freq: Every evening | ORAL | 0 refills | Status: DC

## 2022-05-15 MED ORDER — OZEMPIC (0.25 OR 0.5 MG/DOSE) 2 MG/3ML ~~LOC~~ SOPN: 0.2500 mg | PEN_INJECTOR | SUBCUTANEOUS | 0 refills | Status: DC

## 2022-05-15 MED ORDER — VALSARTAN-HYDROCHLOROTHIAZIDE 80-12.5 MG PO TABS: 1.0000 | ORAL_TABLET | Freq: Every day | ORAL | 0 refills | Status: DC

## 2022-05-15 MED ORDER — OZEMPIC (0.25 OR 0.5 MG/DOSE) 2 MG/3ML ~~LOC~~ SOPN: 0.5000 mg | PEN_INJECTOR | SUBCUTANEOUS | 0 refills | Status: DC

## 2022-05-15 NOTE — Telephone Encounter (Signed)
Stacey Stanley from [facility] [phone]  called to request clarification of dose regarding Ozempic. Which dose do you want to start 0.'25mg'$  or  0.5 mg? Please resend Rx with which dose patient is to administer.        Reason for Disposition  [1] Pharmacy calling with prescription question AND [2] triager unable to answer question  Answer Assessment - Initial Assessment Questions 1. NAME of MEDICINE: "What medicine(s) are you calling about?"     Ozempic 2. QUESTION: "What is your question?" (e.g., double dose of medicine, side effect)     Need clarification on what dose to start? Orders are to give 0.25-0.5 mg . Which dose do you want patient to start with and how long? 3. PRESCRIBER: "Who prescribed the medicine?" Reason: if prescribed by specialist, call should be referred to that group.     PCP 4. SYMPTOMS: "Do you have any symptoms?" If Yes, ask: "What symptoms are you having?"  "How bad are the symptoms (e.g., mild, moderate, severe)     na 5. PREGNANCY:  "Is there any chance that you are pregnant?" "When was your last menstrual period?"     na  Protocols used: Medication Question Call-A-AH

## 2022-05-17 ENCOUNTER — Emergency Department (HOSPITAL_COMMUNITY)
Admission: EM | Admit: 2022-05-17 | Discharge: 2022-05-17 | Disposition: A | Discharge status: Home / Self Care | Payer: Federal, State, Local not specified - PPO | Attending: Emergency Medicine | Admitting: Emergency Medicine

## 2022-05-17 ENCOUNTER — Encounter (HOSPITAL_COMMUNITY): Payer: Self-pay | Admitting: Emergency Medicine

## 2022-05-17 ENCOUNTER — Emergency Department (HOSPITAL_COMMUNITY): Payer: Federal, State, Local not specified - PPO

## 2022-05-17 ENCOUNTER — Other Ambulatory Visit: Payer: Self-pay

## 2022-05-17 DIAGNOSIS — I10 Essential (primary) hypertension: Secondary | ICD-10-CM | POA: Diagnosis not present

## 2022-05-17 DIAGNOSIS — R29818 Other symptoms and signs involving the nervous system: Secondary | ICD-10-CM | POA: Diagnosis not present

## 2022-05-17 DIAGNOSIS — R519 Headache, unspecified: Secondary | ICD-10-CM | POA: Diagnosis not present

## 2022-05-17 DIAGNOSIS — R4701 Aphasia: Secondary | ICD-10-CM | POA: Diagnosis not present

## 2022-05-17 LAB — I-STAT CHEM 8, ED
BUN: 6 mg/dL (ref 6–20)
Calcium, Ion: 1.28 mmol/L (ref 1.15–1.40)
Chloride: 104 mmol/L (ref 98–111)
Creatinine, Ser: 0.7 mg/dL (ref 0.44–1.00)
Glucose, Bld: 101 mg/dL — ABNORMAL HIGH (ref 70–99)
HCT: 45 % (ref 36.0–46.0)
Hemoglobin: 15.3 g/dL — ABNORMAL HIGH (ref 12.0–15.0)
Potassium: 3.7 mmol/L (ref 3.5–5.1)
Sodium: 141 mmol/L (ref 135–145)
TCO2: 26 mmol/L (ref 22–32)

## 2022-05-17 LAB — COMPREHENSIVE METABOLIC PANEL
ALT: 12 U/L (ref 0–44)
AST: 16 U/L (ref 15–41)
Albumin: 4.1 g/dL (ref 3.5–5.0)
Alkaline Phosphatase: 52 U/L (ref 38–126)
Anion gap: 9 (ref 5–15)
BUN: 6 mg/dL (ref 6–20)
CO2: 24 mmol/L (ref 22–32)
Calcium: 10 mg/dL (ref 8.9–10.3)
Chloride: 106 mmol/L (ref 98–111)
Creatinine, Ser: 0.81 mg/dL (ref 0.44–1.00)
GFR, Estimated: >60 mL/min (ref >60)
Glucose, Bld: 104 mg/dL — ABNORMAL HIGH (ref 70–99)
Potassium: 3.6 mmol/L (ref 3.5–5.1)
Sodium: 139 mmol/L (ref 135–145)
Total Bilirubin: 0.6 mg/dL (ref 0.3–1.2)
Total Protein: 6.4 g/dL — ABNORMAL LOW (ref 6.5–8.1)

## 2022-05-17 LAB — DIFFERENTIAL
Abs Immature Granulocytes: 0.01 10*3/uL (ref 0.00–0.07)
Basophils Absolute: 0.1 10*3/uL (ref 0.0–0.1)
Basophils Relative: 1 %
Eosinophils Absolute: 0.2 10*3/uL (ref 0.0–0.5)
Eosinophils Relative: 3 %
Immature Granulocytes: 0 %
Lymphocytes Relative: 38 %
Lymphs Abs: 2.5 10*3/uL (ref 0.7–4.0)
Monocytes Absolute: 0.4 10*3/uL (ref 0.1–1.0)
Monocytes Relative: 6 %
Neutro Abs: 3.4 10*3/uL (ref 1.7–7.7)
Neutrophils Relative %: 52 %

## 2022-05-17 LAB — CBC
HCT: 45.5 % (ref 36.0–46.0)
Hemoglobin: 14.7 g/dL (ref 12.0–15.0)
MCH: 28.8 pg (ref 26.0–34.0)
MCHC: 32.3 g/dL (ref 30.0–36.0)
MCV: 89.2 fL (ref 80.0–100.0)
Platelets: 288 10*3/uL (ref 150–400)
RBC: 5.1 MIL/uL (ref 3.87–5.11)
RDW: 13.2 % (ref 11.5–15.5)
WBC: 6.6 10*3/uL (ref 4.0–10.5)
nRBC: 0 % (ref 0.0–0.2)

## 2022-05-17 LAB — PROTIME-INR
INR: 1.1 (ref 0.8–1.2)
Prothrombin Time: 13.7 seconds (ref 11.4–15.2)

## 2022-05-17 LAB — URINALYSIS, ROUTINE W REFLEX MICROSCOPIC
Bilirubin Urine: NEGATIVE
Glucose, UA: NEGATIVE mg/dL
Hgb urine dipstick: NEGATIVE
Ketones, ur: NEGATIVE mg/dL
Leukocytes,Ua: NEGATIVE
Nitrite: NEGATIVE
Protein, ur: NEGATIVE mg/dL
Specific Gravity, Urine: 1.003 — ABNORMAL LOW (ref 1.005–1.030)
pH: 8 (ref 5.0–8.0)

## 2022-05-17 LAB — RAPID URINE DRUG SCREEN, HOSP PERFORMED
Amphetamines: NOT DETECTED
Barbiturates: NOT DETECTED
Benzodiazepines: NOT DETECTED
Cocaine: NOT DETECTED
Opiates: NOT DETECTED
Tetrahydrocannabinol: NOT DETECTED

## 2022-05-17 LAB — I-STAT BETA HCG BLOOD, ED (MC, WL, AP ONLY): I-stat hCG, quantitative: <5 m[IU]/mL (ref <5)

## 2022-05-17 LAB — APTT: aPTT: 30 seconds (ref 24–36)

## 2022-05-17 NOTE — ED Notes (Signed)
Pt transported to MRI at this time. Pt alert and in NAD

## 2022-05-17 NOTE — ED Triage Notes (Signed)
Patient from home states that about an hour ago while she was working from home she had a complete loss of speech for about 3 minutes. 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 After 3 minutes it returned but she felt like her speech was off a little and having some expressive aphasia. In triage the patient's speech has returned and she feels it is at her baseline. Patient does not exhibit and other deficits.  Patient c/o of a headache. Denies chest pain, shob. Woke up this AM normal otherwise. Aox4.

## 2022-05-17 NOTE — ED Notes (Signed)
Reviewed discharge instructions with patient. Follow-up care reviewed. Patient verbalized understanding. Patient A&Ox4, VSS, and ambulatory with steady gait upon discharge.  

## 2022-05-17 NOTE — ED Provider Triage Note (Signed)
Emergency Medicine Provider Triage Evaluation Note  Stacey Stanley , a 46 y.o. female  was evaluated in triage.  Pt complains of inability to speak.  Patient states that she was working out her desk earlier today when she stood up from a seated position answering her call and lost ability to talk.  She states episode lasted approximately 3 to 5 minutes.  She states that she was consciously aware thinking of words to say but was unable to express them.  She states that subsequently she had ability to speak but was speaking in a "nonsensical manner."  She states this episode lasted approximately 7 to 10 minutes with return to baseline.  Patient currently has no complaints.  Currently complaining of no visual deficits, slurred speech, drooping of the face, difficulty speaking, weakness/sensory deficits, gait abnormalities..  Review of Systems  Positive: See above Negative:   Physical Exam  BP (!) 149/105   Pulse 87   Temp (!) 97.5 F (36.4 C)   Resp (!) 21   LMP 07/09/2015 (Exact Date)   SpO2 99%  Gen:   Awake, no distress   Resp:  Normal effort  MSK:   Moves extremities without difficulty  Other:  Cranial nerves III through XII grossly intact.  Medical Decision Making  Medically screening exam initiated at 10:21 AM.  Appropriate orders placed.  LEILANA MCQUIRE was informed that the remainder of the evaluation will be completed by another provider, this initial triage assessment does not replace that evaluation, and the importance of remaining in the ED until their evaluation is complete.     Wilnette Kales, [place] 05/17/22 1023

## 2022-05-17 NOTE — ED Notes (Signed)
Pt ambulated back to room w/ steady gait and in NAD

## 2022-05-17 NOTE — ED Provider Notes (Signed)
[facility] EMERGENCY DEPARTMENT Provider Note   CSN: [id number] Arrival date & time: 05/17/22  0102     History Chief Complaint  Patient presents with   Aphasia    HPI Stacey Stanley is a 46 y.o. female presenting for multiple complaints.  She endorses that earlier today she had an episode of aphasia where she lost the ability to talk.  Lasted approximately 3 minutes before resolving.  She denies fevers or chills, nausea vomiting, syncope or shortness of breath.  She is otherwise ambulatory tolerating p.o. intake.  No known sick contacts.   Patient's recorded medical, surgical, social, medication list and allergies were reviewed in the Snapshot window as part of the initial history.   Review of Systems   Review of Systems  Constitutional:  Negative for chills and fever.  HENT:  Negative for ear pain and sore throat.   Eyes:  Negative for pain and visual disturbance.  Respiratory:  Negative for cough and shortness of breath.   Cardiovascular:  Negative for chest pain and palpitations.  Gastrointestinal:  Negative for abdominal pain and vomiting.  Genitourinary:  Negative for dysuria and hematuria.  Musculoskeletal:  Negative for arthralgias and back pain.  Skin:  Negative for color change and rash.  Neurological:  Positive for speech difficulty. Negative for seizures and syncope.  All other systems reviewed and are negative.   Physical Exam Updated Vital Signs BP (!) 135/99 (BP Location: Left Arm)   Pulse 82   Temp 98.3 F (36.8 C) (Oral)   Resp 15   LMP 07/09/2015 (Exact Date)   SpO2 100%  Physical Exam Vitals and nursing note reviewed.  Constitutional:      General: She is not in acute distress.    Appearance: She is well-developed.  HENT:     Head: Normocephalic and atraumatic.  Eyes:     Conjunctiva/sclera: Conjunctivae normal.  Cardiovascular:     Rate and Rhythm: Normal rate and regular rhythm.     Heart sounds: No murmur  heard. Pulmonary:     Effort: Pulmonary effort is normal. No respiratory distress.     Breath sounds: Normal breath sounds.  Abdominal:     Palpations: Abdomen is soft.     Tenderness: There is no abdominal tenderness.  Musculoskeletal:        General: No swelling.     Cervical back: Neck supple.  Skin:    General: Skin is warm and dry.     Capillary Refill: Capillary refill takes less than 2 seconds.  Neurological:     Mental Status: She is alert.  Psychiatric:        Mood and Affect: Mood normal.      ED Course/ Medical Decision Making/ A&P Clinical Course as of 05/17/22 1327  Sun May 17, 2022  1155 Discussed with neurology NP on-call.  She recommended MRI.  If positive reconsult for neurology if negative patient can follow-up outpatient. [CC]    Clinical Course User Index [CC] Tretha Sciara, MD    Procedures Procedures   Medications Ordered in ED Medications - No data to display Medical Decision Making:    AMMA CREAR is a 46 y.o. female who presented to the ED today with aphasia, now resolved Reviewed and confirmed nursing documentation for past medical history, family history, social history.   Patient's HPI and PE findings are most consistent with TIA. Proceeded with CTH per institutional policy which revealed NAA. ED/neurology evaluation is most consistent with TIA with  resolved symptoms at this time. Given the nature/timing of the symptoms, patient requires MRI and further risk stratification. Patient stable in ED and will be arranged for MRI.  Final Assessment and Plan:   MRI was performed demonstrating no acute pathology.Differential is broad including metabolic etiology, underlying neurologic disorder such as MS. fortunately with resolution of symptoms today, there is no acute indication for further emergency intervention.  Patient to follow-up closely with primary care provider in the outpatient setting.  Patient stable for outpatient care and  management after prolonged observation in emergency department today.  Disposition:  I have considered need for hospitalization, however, considering all of the above, I believe this patient is stable for discharge at this time.  Patient/family educated about specific return precautions for given chief complaint and symptoms.  Patient/family educated about follow-up with PCP and neurology.     Patient/family expressed understanding of return precautions and need for follow-up. Patient spoken to regarding all imaging and laboratory results and appropriate follow up for these results. All education provided in verbal form with additional information in written form. Time was allowed for answering of patient questions. Patient discharged.    Emergency Department Medication Summary:   Medications - No data to display        Clinical Impression:  1. Aphasia      Discharge   Final Clinical Impression(s) / ED Diagnoses Final diagnoses:  Aphasia    Rx / DC Orders ED Discharge Orders     None         Tretha Sciara, MD 05/17/22 1327

## 2022-06-22 ENCOUNTER — Encounter: Payer: Self-pay | Admitting: Gastroenterology

## 2022-06-22 MED ORDER — PROMETHAZINE HCL 25 MG PO TABS: 25.0000 mg | ORAL_TABLET | Freq: Three times a day (TID) | ORAL | 0 refills | Status: DC | PRN

## 2022-06-22 NOTE — Telephone Encounter (Signed)
Last office visit 02/05/2022 IBS with constipation  Last refill 08/15/2021 0 refills 14 tablets as needed

## 2022-06-25 ENCOUNTER — Ambulatory Visit
Payer: Federal, State, Local not specified - PPO | Attending: Student in an Organized Health Care Education/Training Program | Admitting: Student in an Organized Health Care Education/Training Program

## 2022-06-25 ENCOUNTER — Encounter: Payer: Self-pay | Admitting: Student in an Organized Health Care Education/Training Program

## 2022-06-25 VITALS — BP 141/95 | HR 93 | Temp 96.3°F | Resp 16 | Ht 65.0 in | Wt 196.0 lb

## 2022-06-25 DIAGNOSIS — G8929 Other chronic pain: Secondary | ICD-10-CM | POA: Diagnosis not present

## 2022-06-25 DIAGNOSIS — G4700 Insomnia, unspecified: Secondary | ICD-10-CM

## 2022-06-25 DIAGNOSIS — Z79899 Other long term (current) drug therapy: Secondary | ICD-10-CM | POA: Insufficient documentation

## 2022-06-25 DIAGNOSIS — N809 Endometriosis, unspecified: Secondary | ICD-10-CM | POA: Diagnosis not present

## 2022-06-25 DIAGNOSIS — R102 Pelvic and perineal pain: Secondary | ICD-10-CM | POA: Insufficient documentation

## 2022-06-25 DIAGNOSIS — G894 Chronic pain syndrome: Secondary | ICD-10-CM

## 2022-06-25 MED ORDER — TAPENTADOL HCL 75 MG PO TABS: 75.0000 mg | ORAL_TABLET | Freq: Two times a day (BID) | ORAL | 0 refills | Status: AC | PRN

## 2022-06-25 MED ORDER — TAPENTADOL HCL 75 MG PO TABS: 75.0000 mg | ORAL_TABLET | Freq: Two times a day (BID) | ORAL | 0 refills | Status: DC | PRN

## 2022-06-25 NOTE — Progress Notes (Signed)
PROVIDER NOTE: Information contained herein reflects review and annotations entered in association with encounter. Interpretation of such information and data should be left to medically-trained personnel. Information provided to patient can be located elsewhere in the medical record under "Patient Instructions". Document created using STT-dictation technology, any transcriptional errors that may result from process are unintentional.    Patient: Stacey Stanley  Service Category: E/M  Provider: [name], MD  DOB: 11/08/1975  DOS: 06/25/2022  Referring Provider: Steele Sizer, MD  MRN: [id number]  Specialty: Interventional Pain Management  PCP: Stacey Sizer, MD  Type: Established Patient  Setting: Ambulatory outpatient    Location: Office  Delivery: Face-to-face     HPI  [name], a 47 y.o. year old female, is here today because of her Chronic pain syndrome [G89.4]. Stacey Stanley primary complain today is Rectal Pain Last encounter: My last encounter with her was on 04/02/22 Pertinent problems: Stacey Stanley has Endometriosis; Chronic pelvic pain in female; Cervical radiculitis; Chronic pain syndrome; and Opiate use on their pertinent problem list. Pain Assessment: Severity of Chronic pain is reported as a 4 /10. Location: Rectum  /radiates from groin to rectum. Onset: More than a month ago. Quality: Sharp, Burning. Timing: Constant. Modifying factor(s): medications, diclofenac gel. Vitals:  height is '5\' 5"'$  (1.651 m) and weight is 196 lb (88.9 kg). Her temperature is 96.3 F (35.7 C) (abnormal). Her blood pressure is 141/95 (abnormal) and her pulse is 93. Her respiration is 16 and oxygen saturation is 100%.   Reason for encounter: medication management.   No change in medical history since last visit.  Patient's pain is at baseline.  Patient continues multimodal pain regimen as prescribed.  States that it provides pain relief and improvement in functional status. Patient states  that she is drinking more water which has helped with the constipation.  Pharmacotherapy Assessment  Analgesic: Stacey Stanley 75 mg twice daily as needed, quantity 45/month; MME equals 45   Monitoring: Stacey Stanley: Stacey Stanley reviewed during this encounter.       Pharmacotherapy: No side-effects or adverse reactions reported. Compliance: No problems identified. Effectiveness: Clinically acceptable.  Stacey Shorter, RN  06/25/2022  1:33 PM  Sign when Signing Visit Nursing Pain Medication Assessment:  Safety precautions to be maintained throughout the outpatient stay will include: orient to surroundings, keep bed in low position, maintain call bell within reach at all times, provide assistance with transfer out of bed and ambulation.  Medication Inspection Compliance: Pill count conducted under aseptic conditions, in front of the patient. Neither the pills nor the bottle was removed from the patient's sight at any time. Once count was completed pills were immediately returned to the patient in their original bottle.  Medication: Tapentadol (Stacey Stanley) Pill/Patch Count:  54 of 60 pills remain Pill/Patch Appearance: Markings consistent with prescribed medication Bottle Appearance: Standard pharmacy container. Clearly labeled. Filled Date: 01 / 20 / 2024 Last Medication intake:  Yesterday    UDS:  Summary  Date Value Ref Range Status  01/08/2022 Note  Final    Comment:    ==================================================================== [employer] [facility] 13 (MW) ==================================================================== Test                             Result       Flag       Units  Drug Present and Declared for Prescription Verification   Tapentadol                     >  8621        EXPECTED   ng/mg creat    Source of tapentadol is a scheduled prescription medication.  ==================================================================== Test                      Result    Flag   Units      Ref  Range   Creatinine              116              mg/dL      >=20 ==================================================================== Declared Medications:  The flagging and interpretation on this report are based on the  following declared medications.  Unexpected results may arise from  inaccuracies in the declared medications.   **Note: The testing scope of this panel includes these medications:   Tapentadol (Stacey Stanley)   **Note: The testing scope of this panel does not include the  following reported medications:   Acetaminophen (Tylenol)  Aspirin  Clopidogrel (Plavix)  Dapsone  Fish Oil  Fluocinolone  Hydrochlorothiazide (Hydrodiuril)  Hydroxyzine (Atarax)  Ibuprofen (Advil)  Imiquimod (Aldara)  Ondansetron (Zofran)  Plecanatide (Trulance)  Prednisone  Semaglutide (Ozempic)  Tretinoin ==================================================================== For clinical consultation, please call [phone]. ====================================================================      ROS  Constitutional: Denies any fever or chills Gastrointestinal: No reported hemesis, hematochezia, vomiting, or acute GI distress Musculoskeletal:  Left lower quadrant, pelvic and flank pain Neurological: No reported episodes of acute onset apraxia, aphasia, dysarthria, agnosia, amnesia, paralysis, loss of coordination, or loss of consciousness  Medication Review  Dapsone, Fish Oil, Fluocin-Hydroquinone-Tretinoin, Plecanatide, Semaglutide(0.25 or 0.'5MG'$ /DOS), acetaminophen, aspirin, hydrOXYzine, ibuprofen, imiquimod, ondansetron, promethazine, tapentadol HCl, and valsartan-hydrochlorothiazide  History Review  Allergy: Stacey Stanley is allergic to nuvigil [armodafinil] and ppd [tuberculin purified protein derivative]. Drug: Stacey Stanley  reports no history of drug use. Alcohol:  reports no history of alcohol use. Tobacco:  reports that she has been smoking cigarettes. She started smoking about  28 years ago. She has a 12.50 pack-year smoking history. She has never used smokeless tobacco. Social: Stacey Stanley  reports that she has been smoking cigarettes. She started smoking about 28 years ago. She has a 12.50 pack-year smoking history. She has never used smokeless tobacco. She reports that she does not drink alcohol and does not use drugs. Medical:  has a past medical history of Anxiety, Arthritis, Chronic insomnia, Depression, Diabetes mellitus without complication ([place]), Dysrhythmia, Endometriosis, GERD (gastroesophageal reflux disease), Gout, Headache(784.0), History of kidney stones, Hypertension, Metabolic syndrome, Obesity, Right thyroid nodule (2017), Serum calcium elevated, Sleep disorder, circadian, shift work type, SVD (spontaneous vaginal delivery), Tachycardia, and TIA (transient ischemic attack). Surgical: Stacey Stanley  has a past surgical history that includes metatarsil  (2003); Dilation and curettage of uterus; Induced abortion; laparoscopy (1992); Eye surgery (06/28/2013); laparoscopy (N/A, 07/19/2013); Hysteroscopy with D & C (N/A, 07/19/2013); Abdominal hysterectomy (05/21/2016); Hemorroidectomy (04/04/2018); Colonoscopy (2019); Colonoscopy with propofol (N/A, 07/04/2021); Esophagogastroduodenoscopy (egd) with propofol (N/A, 07/04/2021); and Rectal surgery (08/15/2021). Family: family history includes Alzheimer's disease in her paternal grandfather; Diabetes in her mother; Heart disease in her brother; Hypertension in her father and mother; Kidney disease in her father; Leukemia in her paternal grandmother; Stroke in her maternal grandmother.  Laboratory Chemistry Profile   Renal Lab Results  Component Value Date   BUN 6 05/17/2022   CREATININE 0.70 35/45/6256   BCR NOT APPLICABLE 38/93/7342   GFRAA 108 11/28/2020   GFRNONAA >60 05/17/2022    Hepatic Lab Results  Component Value Date   AST 16 05/17/2022   ALT 12 05/17/2022   ALBUMIN 4.1 05/17/2022   ALKPHOS 52  05/17/2022   AMYLASE 102 (H) 05/22/2016   LIPASE 13 04/01/2022    Electrolytes Lab Results  Component Value Date   NA 141 05/17/2022   K 3.7 05/17/2022   CL 104 05/17/2022   CALCIUM 10.0 05/17/2022   MG 1.5 09/15/2017    Bone Lab Results  Component Value Date   VD25OH 37 08/08/2021    Inflammation (CRP: Acute Phase) (ESR: Chronic Phase) Lab Results  Component Value Date   CRP 0.2 05/06/2020   ESRSEDRATE 2 05/06/2020         Note: Above Lab results reviewed.  Recent Imaging Review  MR BRAIN WO CONTRAST CLINICAL DATA:  47 year old female with headache and neurologic deficit.  EXAM: MRI HEAD WITHOUT CONTRAST  TECHNIQUE: Multiplanar, multiecho pulse sequences of the brain and surrounding structures were obtained without intravenous contrast.  COMPARISON:  Head CT 1042 hours today.  Brain MRI 12/12/2021.  FINDINGS: Significant metal artifact from dental braces, similar to the July MRI. And once again some sequences are substantially limited despite efforts to minimize susceptibility-especially DWI, SWI, and FLAIR.  Brain: No intracranial mass effect, midline shift or ventriculomegaly. Stable cerebral volume.  No restricted diffusion is evident. T1 and T2 signal in the brain appears normal. Limited FLAIR and SWI visualization. Negative pituitary and cervicomedullary junction.  Vascular: Major intracranial vascular flow voids appear stable.  Skull and upper cervical spine: Visualized bone marrow signal is within normal limits. Negative visible cervical spine.  Sinuses/Orbits: Within normal limits when allowing for artifact.  Other: Mastoids remain clear. Visible internal auditory structures appear normal. Incidental midline nasopharyngeal Tornwaldt cyst is stable, normal variant.  IMPRESSION: Substantial susceptibility artifact from dental braces, similar to July Brain MRI. No intracranial abnormality identified.  Electronically Signed   By: Genevie Ann  M.D.   On: 05/17/2022 12:28 CT HEAD WO CONTRAST CLINICAL DATA:  Headache, neuro deficit  EXAM: CT HEAD WITHOUT CONTRAST  TECHNIQUE: Contiguous axial images were obtained from the base of the skull through the vertex without intravenous contrast.  RADIATION DOSE REDUCTION: This exam was performed according to the departmental dose-optimization program which includes automated exposure control, adjustment of the mA and/or kV according to patient size and/or use of iterative reconstruction technique.  COMPARISON:  12/12/2021  FINDINGS: Brain: No evidence of acute infarction, hemorrhage, hydrocephalus, extra-axial collection or mass lesion/mass effect.  Vascular: No hyperdense vessel or unexpected calcification.  Skull: Normal. Negative for fracture or focal lesion.  Sinuses/Orbits: No acute finding.  Other: None.  IMPRESSION: No acute intracranial abnormality.  Electronically Signed   By: Davina Poke D.O.   On: 05/17/2022 10:56 Note: Reviewed        Physical Exam  General appearance: Well nourished, well developed, and well hydrated. In no apparent acute distress Mental status: Alert, oriented x 3 (person, place, & time)       Respiratory: No evidence of acute respiratory distress Eyes: PERLA Vitals: BP (!) 141/95   Pulse 93   Temp (!) 96.3 F (35.7 C)   Resp 16   Ht '5\' 5"'$  (1.651 m)   Wt 196 lb (88.9 kg)   LMP 07/09/2015 (Exact Date)   SpO2 100%   BMI 32.62 kg/m  BMI: Estimated body mass index is 32.62 kg/m as calculated from the following:   Height as of this encounter: '5\' 5"'$  (1.651 m).   Weight as  of this encounter: 196 lb (88.9 kg). Ideal: Ideal body weight: 57 kg (125 lb 10.6 oz) Adjusted ideal body weight: 69.8 kg (153 lb 12.8 oz)    5 out of 5 strength bilateral upper extremity: Shoulder abduction, elbow flexion, elbow extension, thumb extension. 5 out of 5 strength bilateral lower extremity: Plantar flexion, dorsiflexion, knee flexion, knee  extension.   Assessment   Diagnosis Status  1. Chronic pain syndrome   2. Chronic pelvic pain in female   3. Endometriosis   4. Insomnia, persistent   5. Long term prescription benzodiazepine use     Persistent Persistent Persistent    Plan of Care    Stacey Stanley has a current medication list which includes the following long-term medication(s): promethazine and valsartan-hydrochlorothiazide.  Pharmacotherapy (Medications Ordered): Meds ordered this encounter  Medications   tapentadol HCl (Stacey Stanley) 75 MG tablet    Sig: Take 1 tablet (75 mg total) by mouth 2 (two) times daily as needed for moderate pain.    Dispense:  60 tablet    Refill:  0    Chronic pain syndrome   tapentadol HCl (Stacey Stanley) 75 MG tablet    Sig: Take 1 tablet (75 mg total) by mouth 2 (two) times daily as needed for moderate pain.    Dispense:  60 tablet    Refill:  0    Chronic pain syndrome   tapentadol HCl (Stacey Stanley) 75 MG tablet    Sig: Take 1 tablet (75 mg total) by mouth 2 (two) times daily as needed for moderate pain.    Dispense:  60 tablet    Refill:  0    Chronic pain syndrome   Orders:  No orders of the defined types were placed in this encounter.  Follow-up plan:   Return in about 15 weeks (around 10/08/2022) for Medication Management, in person.    Recent Visits Date Type Provider Dept  04/02/22 Office Visit Stacey Santa, MD [facility]  Showing recent visits within past 90 days and meeting all other requirements Today's Visits Date Type Provider Dept  06/25/22 Office Visit Stacey Santa, MD [facility]  Showing today's visits and meeting all other requirements Future Appointments No visits were found meeting these conditions. Showing future appointments within next 90 days and meeting all other requirements  I discussed the assessment and treatment plan with the patient. The patient was provided an opportunity to ask questions and all were answered.  The patient agreed with the plan and demonstrated an understanding of the instructions.  Patient advised to call back or seek an in-person evaluation if the symptoms or condition worsens.  Duration of encounter: 15mnutes.  Total time on encounter, as per AMA guidelines included both the face-to-face and non-face-to-face time personally spent by the physician and/or other qualified health care professional(s) on the day of the encounter (includes time in activities that require the physician or other qualified health care professional and does not include time in activities normally performed by clinical staff). Physician's time may include the following activities when performed: preparing to see the patient (eg, review of tests, pre-charting review of records) obtaining and/or reviewing separately obtained history performing a medically appropriate examination and/or evaluation counseling and educating the patient/family/caregiver ordering medications, tests, or procedures referring and communicating with other health care professionals (when not separately reported) documenting clinical information in the electronic or other health record independently interpreting results (not separately reported) and communicating results to the patient/ family/caregiver care coordination (not separately  reported)  Note by: Stacey Santa, MD Date: 06/25/2022; Time: 1:49 PM

## 2022-06-25 NOTE — Progress Notes (Signed)
Nursing Pain Medication Assessment:  Safety precautions to be maintained throughout the outpatient stay will include: orient to surroundings, keep bed in low position, maintain call bell within reach at all times, provide assistance with transfer out of bed and ambulation.  Medication Inspection Compliance: Pill count conducted under aseptic conditions, in front of the patient. Neither the pills nor the bottle was removed from the patient's sight at any time. Once count was completed pills were immediately returned to the patient in their original bottle.  Medication: Tapentadol (Nucynta) Pill/Patch Count:  54 of 60 pills remain Pill/Patch Appearance: Markings consistent with prescribed medication Bottle Appearance: Standard pharmacy container. Clearly labeled. Filled Date: 01 / 20 / 2024 Last Medication intake:  Yesterday

## 2022-07-05 ENCOUNTER — Other Ambulatory Visit: Payer: Self-pay | Admitting: Family Medicine

## 2022-07-08 DIAGNOSIS — M5412 Radiculopathy, cervical region: Secondary | ICD-10-CM | POA: Diagnosis not present

## 2022-07-08 DIAGNOSIS — M791 Myalgia, unspecified site: Secondary | ICD-10-CM | POA: Diagnosis not present

## 2022-07-09 ENCOUNTER — Other Ambulatory Visit: Payer: Self-pay | Admitting: Student in an Organized Health Care Education/Training Program

## 2022-07-09 DIAGNOSIS — M5412 Radiculopathy, cervical region: Secondary | ICD-10-CM

## 2022-07-18 ENCOUNTER — Ambulatory Visit
Admission: RE | Admit: 2022-07-18 | Discharge: 2022-07-18 | Disposition: A | Discharge status: Home / Self Care | Payer: Federal, State, Local not specified - PPO | Source: Ambulatory Visit | Attending: Student in an Organized Health Care Education/Training Program | Admitting: Student in an Organized Health Care Education/Training Program

## 2022-07-18 DIAGNOSIS — M542 Cervicalgia: Secondary | ICD-10-CM | POA: Diagnosis not present

## 2022-07-18 DIAGNOSIS — M47812 Spondylosis without myelopathy or radiculopathy, cervical region: Secondary | ICD-10-CM | POA: Diagnosis not present

## 2022-07-18 DIAGNOSIS — M4802 Spinal stenosis, cervical region: Secondary | ICD-10-CM | POA: Diagnosis not present

## 2022-07-18 DIAGNOSIS — R200 Anesthesia of skin: Secondary | ICD-10-CM | POA: Diagnosis not present

## 2022-07-18 DIAGNOSIS — M5412 Radiculopathy, cervical region: Secondary | ICD-10-CM

## 2022-08-05 DIAGNOSIS — M47812 Spondylosis without myelopathy or radiculopathy, cervical region: Secondary | ICD-10-CM | POA: Diagnosis not present
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
## 2022-08-13 ENCOUNTER — Encounter: Payer: Self-pay | Admitting: Family Medicine

## 2022-08-13 ENCOUNTER — Ambulatory Visit (INDEPENDENT_AMBULATORY_CARE_PROVIDER_SITE_OTHER): Payer: Federal, State, Local not specified - PPO | Admitting: Family Medicine

## 2022-08-13 VITALS — BP 136/82 | HR 94 | Temp 97.8°F | Resp 14 | Ht 65.0 in | Wt 187.6 lb

## 2022-08-13 DIAGNOSIS — E559 Vitamin D deficiency, unspecified: Secondary | ICD-10-CM

## 2022-08-13 DIAGNOSIS — G4700 Insomnia, unspecified: Secondary | ICD-10-CM

## 2022-08-13 DIAGNOSIS — E538 Deficiency of other specified B group vitamins: Secondary | ICD-10-CM

## 2022-08-13 DIAGNOSIS — I10 Essential (primary) hypertension: Secondary | ICD-10-CM

## 2022-08-13 DIAGNOSIS — E8881 Metabolic syndrome: Secondary | ICD-10-CM

## 2022-08-13 DIAGNOSIS — F411 Generalized anxiety disorder: Secondary | ICD-10-CM

## 2022-08-13 DIAGNOSIS — F325 Major depressive disorder, single episode, in full remission: Secondary | ICD-10-CM | POA: Diagnosis not present

## 2022-08-13 DIAGNOSIS — R7303 Prediabetes: Secondary | ICD-10-CM | POA: Diagnosis not present

## 2022-08-13 DIAGNOSIS — G894 Chronic pain syndrome: Secondary | ICD-10-CM

## 2022-08-13 DIAGNOSIS — Z8673 Personal history of transient ischemic attack (TIA), and cerebral infarction without residual deficits: Secondary | ICD-10-CM

## 2022-08-13 MED ORDER — VALSARTAN-HYDROCHLOROTHIAZIDE 160-12.5 MG PO TABS: 1.0000 | ORAL_TABLET | Freq: Every day | ORAL | 0 refills | Status: DC

## 2022-08-13 MED ORDER — OZEMPIC (0.25 OR 0.5 MG/DOSE) 2 MG/3ML ~~LOC~~ SOPN: 0.5000 mg | PEN_INJECTOR | SUBCUTANEOUS | 0 refills | Status: DC

## 2022-08-14 LAB — CBC WITH DIFFERENTIAL/PLATELET
Absolute Monocytes: 297 cells/uL (ref 200–950)
Basophils Absolute: 53 cells/uL (ref 0–200)
Basophils Relative: 0.8 %
Eosinophils Absolute: 238 cells/uL (ref 15–500)
Eosinophils Relative: 3.6 %
HCT: 43.8 % (ref 35.0–45.0)
Hemoglobin: 14.4 g/dL (ref 11.7–15.5)
Lymphs Abs: 2891 cells/uL (ref 850–3900)
MCH: 28 pg (ref 27.0–33.0)
MCHC: 32.9 g/dL (ref 32.0–36.0)
MCV: 85.2 fL (ref 80.0–100.0)
MPV: 9.3 fL (ref 7.5–12.5)
Monocytes Relative: 4.5 %
Neutro Abs: 3122 cells/uL (ref 1500–7800)
Neutrophils Relative %: 47.3 %
Platelets: 309 10*3/uL (ref 140–400)
RBC: 5.14 10*6/uL — ABNORMAL HIGH (ref 3.80–5.10)
RDW: 12.8 % (ref 11.0–15.0)
Total Lymphocyte: 43.8 %
WBC: 6.6 10*3/uL (ref 3.8–10.8)

## 2022-08-14 LAB — PTH, INTACT AND CALCIUM
Calcium: 10.2 mg/dL (ref 8.6–10.2)
PTH: 64 pg/mL (ref 16–77)

## 2022-08-14 LAB — COMPLETE METABOLIC PANEL WITH GFR
AG Ratio: 1.6 (calc) (ref 1.0–2.5)
ALT: 9 U/L (ref 6–29)
AST: 11 U/L (ref 10–35)
Albumin: 4.2 g/dL (ref 3.6–5.1)
Alkaline phosphatase (APISO): 61 U/L (ref 31–125)
BUN: 13 mg/dL (ref 7–25)
CO2: 27 mmol/L (ref 20–32)
Calcium: 10.2 mg/dL (ref 8.6–10.2)
Chloride: 107 mmol/L (ref 98–110)
Creat: 0.82 mg/dL (ref 0.50–0.99)
Globulin: 2.6 g/dL (calc) (ref 1.9–3.7)
Glucose, Bld: 90 mg/dL (ref 65–99)
Potassium: 3.9 mmol/L (ref 3.5–5.3)
Sodium: 143 mmol/L (ref 135–146)
Total Bilirubin: 0.4 mg/dL (ref 0.2–1.2)
Total Protein: 6.8 g/dL (ref 6.1–8.1)
eGFR: 88 mL/min/{1.73_m2} (ref >=60)

## 2022-08-14 LAB — LIPID PANEL
Cholesterol: 170 mg/dL (ref <200)
HDL: 63 mg/dL (ref >=50)
LDL Cholesterol (Calc): 90 mg/dL (calc)
Non-HDL Cholesterol (Calc): 107 mg/dL (calc) (ref <130)
Total CHOL/HDL Ratio: 2.7 (calc) (ref <5.0)
Triglycerides: 81 mg/dL (ref <150)

## 2022-08-14 LAB — HEMOGLOBIN A1C
Hgb A1c MFr Bld: 6.1 % of total Hgb — ABNORMAL HIGH (ref <5.7)
Mean Plasma Glucose: 128 mg/dL
eAG (mmol/L): 7.1 mmol/L

## 2022-08-14 LAB — B12 AND FOLATE PANEL
Folate: 8.7 ng/mL
Vitamin B-12: 378 pg/mL (ref 200–1100)

## 2022-08-14 LAB — VITAMIN D 25 HYDROXY (VIT D DEFICIENCY, FRACTURES): Vit D, 25-Hydroxy: 37 ng/mL (ref 30–100)

## 2022-08-19 ENCOUNTER — Encounter: Payer: Self-pay | Admitting: Gastroenterology

## 2022-08-19 ENCOUNTER — Encounter: Payer: Self-pay | Admitting: Family Medicine

## 2022-08-19 MED ORDER — LUBIPROSTONE 24 MCG PO CAPS: 24.0000 ug | ORAL_CAPSULE | Freq: Two times a day (BID) | ORAL | 1 refills | Status: DC

## 2022-08-19 MED ORDER — ONDANSETRON 4 MG PO TBDP: 4.0000 mg | ORAL_TABLET | Freq: Three times a day (TID) | ORAL | 1 refills | Status: DC | PRN

## 2022-08-21 ENCOUNTER — Telehealth: Payer: Self-pay

## 2022-08-21 NOTE — Telephone Encounter (Signed)
Submitted PA through cover my meds for Amitiza 24mcg. Waiting on response from insurance company  

## 2022-08-24 NOTE — Telephone Encounter (Signed)
Insurance company approved medication till 08/21/23

## 2022-09-04 DIAGNOSIS — M65331 Trigger finger, right middle finger: Secondary | ICD-10-CM | POA: Diagnosis not present

## 2022-09-11 ENCOUNTER — Telehealth: Payer: Self-pay | Admitting: Student in an Organized Health Care Education/Training Program

## 2022-09-11 NOTE — Telephone Encounter (Signed)
Called patient and she stated that MD was sending a form to [facility] for trigger finger. I found the form on the fax and placed on Dr Cherylann Ratel desk to sign and will fax back. Patient notified.

## 2022-09-11 NOTE — Telephone Encounter (Signed)
PT called wanted to see have doctor Lateef got the fax for her to get clearance so she can get surgery.done. PT stated that the paper work will be coming from Dr. Renaye Rakers.PT stated that she will be having surgery on her hand. Please give patient a call. TY

## 2022-09-23 DIAGNOSIS — M47812 Spondylosis without myelopathy or radiculopathy, cervical region: Secondary | ICD-10-CM | POA: Diagnosis not present

## 2022-09-23 DIAGNOSIS — M791 Myalgia, unspecified site: Secondary | ICD-10-CM | POA: Diagnosis not present

## 2022-10-06 ENCOUNTER — Ambulatory Visit
Payer: Federal, State, Local not specified - PPO | Attending: Student in an Organized Health Care Education/Training Program | Admitting: Student in an Organized Health Care Education/Training Program

## 2022-10-06 ENCOUNTER — Encounter: Payer: Self-pay | Admitting: Student in an Organized Health Care Education/Training Program

## 2022-10-06 VITALS — BP 106/68 | HR 82 | Temp 97.3°F | Ht 65.0 in | Wt 185.0 lb

## 2022-10-06 DIAGNOSIS — N809 Endometriosis, unspecified: Secondary | ICD-10-CM | POA: Diagnosis not present

## 2022-10-06 DIAGNOSIS — M5412 Radiculopathy, cervical region: Secondary | ICD-10-CM | POA: Diagnosis not present

## 2022-10-06 DIAGNOSIS — R102 Pelvic and perineal pain: Secondary | ICD-10-CM

## 2022-10-06 DIAGNOSIS — F320 Major depressive disorder, single episode, mild: Secondary | ICD-10-CM

## 2022-10-06 DIAGNOSIS — G894 Chronic pain syndrome: Secondary | ICD-10-CM

## 2022-10-06 DIAGNOSIS — G4700 Insomnia, unspecified: Secondary | ICD-10-CM | POA: Diagnosis not present

## 2022-10-06 DIAGNOSIS — G8929 Other chronic pain: Secondary | ICD-10-CM | POA: Diagnosis not present

## 2022-10-06 MED ORDER — TAPENTADOL HCL 75 MG PO TABS: 75.0000 mg | ORAL_TABLET | Freq: Two times a day (BID) | ORAL | 0 refills | Status: AC | PRN

## 2022-10-06 MED ORDER — TAPENTADOL HCL 75 MG PO TABS: 75.0000 mg | ORAL_TABLET | Freq: Two times a day (BID) | ORAL | 0 refills | Status: DC | PRN

## 2022-10-06 NOTE — Progress Notes (Signed)
Nursing Pain Medication Assessment:  Safety precautions to be maintained throughout the outpatient stay will include: orient to surroundings, keep bed in low position, maintain call bell within reach at all times, provide assistance with transfer out of bed and ambulation.  Medication Inspection Compliance: Pill count conducted under aseptic conditions, in front of the patient. Neither the pills nor the bottle was removed from the patient's sight at any time. Once count was completed pills were immediately returned to the patient in their original bottle.  Medication: Tapentadol (Nucynta) Pill/Patch Count:  20 of 60 pills remain Pill/Patch Appearance: Markings consistent with prescribed medication Bottle Appearance: Standard pharmacy container. Clearly labeled. Filled Date: 04 / 19 / 2024 Last Medication intake:  YesterdaySafety precautions to be maintained throughout the outpatient stay will include: orient to surroundings, keep bed in low position, maintain call bell within reach at all times, provide assistance with transfer out of bed and ambulation.

## 2022-10-06 NOTE — Progress Notes (Signed)
PROVIDER NOTE: Information contained herein reflects review and annotations entered in association with encounter. Interpretation of such information and data should be left to medically-trained personnel. Information provided to patient can be located elsewhere in the medical record under "Patient Instructions". Document created using STT-dictation technology, any transcriptional errors that may result from process are unintentional.    Patient: Stacey Stanley  Service Category: E/M  Provider: Edward Jolly, MD  DOB: 1975-06-02  DOS: 10/06/2022  Referring Provider: Alba Cory, MD  MRN: [id number]  Specialty: Interventional Pain Management  PCP: Alba Cory, MD  Type: Established Patient  Setting: Ambulatory outpatient    Location: Office  Delivery: Face-to-face     HPI  [name], a 47 y.o. year old female, is here today because of her Chronic pain syndrome [G89.4]. [name] primary complain today is Abdominal Pain (Pelvic and rectal) Last encounter: My last encounter with her was on 06/25/22 Pertinent problems: [name] has Endometriosis; Chronic pelvic pain in female; Cervical radiculitis; Chronic pain syndrome; and Opiate use on their pertinent problem list. Pain Assessment: Severity of Chronic pain is reported as a 7 /10. Location: Pelvis  /denies. Onset: More than a month ago. Quality: Constant, Sharp. Timing: Constant. Modifying factor(s): meds. Vitals:  height is 5\' 5"  (1.651 m) and weight is 185 lb (83.9 kg). Her temporal temperature is 97.3 F (36.3 C) (abnormal). Her blood pressure is 106/68 and her pulse is 82. Her oxygen saturation is 99%.   Reason for encounter: medication management.   No change in medical history since last visit.  Patient's pain is at baseline.  Patient continues multimodal pain regimen as prescribed.  States that it provides pain relief and improvement in functional status. Having right trigger finger surgery next week (middle and ring  finger)  Pharmacotherapy Assessment  Analgesic: Nucynta 75 mg twice daily as needed, quantity 45/month; MME equals 45   Monitoring:  PMP: PDMP reviewed during this encounter.       Pharmacotherapy: No side-effects or adverse reactions reported. Compliance: No problems identified. Effectiveness: Clinically acceptable.  Florina Ou, RN  10/06/2022  8:05 AM  Sign when Signing Visit Nursing Pain Medication Assessment:  Safety precautions to be maintained throughout the outpatient stay will include: orient to surroundings, keep bed in low position, maintain call bell within reach at all times, provide assistance with transfer out of bed and ambulation.  Medication Inspection Compliance: Pill count conducted under aseptic conditions, in front of the patient. Neither the pills nor the bottle was removed from the patient's sight at any time. Once count was completed pills were immediately returned to the patient in their original bottle.  Medication: Tapentadol (Nucynta) Pill/Patch Count:  20 of 60 pills remain Pill/Patch Appearance: Markings consistent with prescribed medication Bottle Appearance: Standard pharmacy container. Clearly labeled. Filled Date: 04 / 19 / 2024 Last Medication intake:  YesterdaySafety precautions to be maintained throughout the outpatient stay will include: orient to surroundings, keep bed in low position, maintain call bell within reach at all times, provide assistance with transfer out of bed and ambulation.     UDS:  Summary  Date Value Ref Range Status  01/08/2022 Note  Final    Comment:    ==================================================================== [employer] Select 13 (MW) ==================================================================== Test                             Result       Flag  Units  Drug Present and Declared for Prescription Verification   Tapentadol                     [phone]        EXPECTED   ng/mg creat    Source of  tapentadol is a scheduled prescription medication.  ==================================================================== Test                      Result    Flag   Units      Ref Range   Creatinine              116              mg/dL      >=96 ==================================================================== Declared Medications:  The flagging and interpretation on this report are based on the  following declared medications.  Unexpected results may arise from  inaccuracies in the declared medications.   **Note: The testing scope of this panel includes these medications:   Tapentadol (Nucynta)   **Note: The testing scope of this panel does not include the  following reported medications:   Acetaminophen (Tylenol)  Aspirin  Clopidogrel (Plavix)  Dapsone  Fish Oil  Fluocinolone  Hydrochlorothiazide (Hydrodiuril)  Hydroxyzine (Atarax)  Ibuprofen (Advil)  Imiquimod (Aldara)  Ondansetron (Zofran)  Plecanatide (Trulance)  Prednisone  Semaglutide (Ozempic)  Tretinoin ==================================================================== For clinical consultation, please call [phone]. ====================================================================      ROS  Constitutional: Denies any fever or chills Gastrointestinal: No reported hemesis, hematochezia, vomiting, or acute GI distress Musculoskeletal: right trigger finger Neurological: No reported episodes of acute onset apraxia, aphasia, dysarthria, agnosia, amnesia, paralysis, loss of coordination, or loss of consciousness  Medication Review  Dapsone, Fish Oil, Fluocin-Hydroquinone-Tretinoin, Plecanatide, Semaglutide(0.25 or 0.5MG /DOS), acetaminophen, aspirin, clindamycin, hydrOXYzine, ibuprofen, imiquimod, lubiprostone, mupirocin ointment, ondansetron, promethazine, tapentadol HCl, and valsartan-hydrochlorothiazide  History Review  Allergy: [name] is allergic to nuvigil [armodafinil] and ppd [tuberculin  purified protein derivative]. Drug: [name]  reports no history of drug use. Alcohol:  reports no history of alcohol use. Tobacco:  reports that she has been smoking cigarettes. She started smoking about 28 years ago. She has a 12.50 pack-year smoking history. She has never used smokeless tobacco. Social: Ms. Rizo  reports that she has been smoking cigarettes. She started smoking about 28 years ago. She has a 12.50 pack-year smoking history. She has never used smokeless tobacco. She reports that she does not drink alcohol and does not use drugs. Medical:  has a past medical history of Anxiety, Arthritis, Chronic insomnia, Depression, Diabetes mellitus without complication (HCC), Dysrhythmia, Endometriosis, GERD (gastroesophageal reflux disease), Gout, Headache(784.0), History of kidney stones, Hypertension, Metabolic syndrome, Obesity, Right thyroid nodule (2017), Serum calcium elevated, Sleep disorder, circadian, shift work type, SVD (spontaneous vaginal delivery), Tachycardia, and TIA (transient ischemic attack). Surgical: Ms. Statz  has a past surgical history that includes metatarsil  (2003); Dilation and curettage of uterus; Induced abortion; laparoscopy (1992); Eye surgery (06/28/2013); laparoscopy (N/A, 07/19/2013); Hysteroscopy with D & C (N/A, 07/19/2013); Abdominal hysterectomy (05/21/2016); Hemorroidectomy (04/04/2018); Colonoscopy (2019); Colonoscopy with propofol (N/A, 07/04/2021); Esophagogastroduodenoscopy (egd) with propofol (N/A, 07/04/2021); and Rectal surgery (08/15/2021). Family: family history includes Alzheimer's disease in her paternal grandfather; Diabetes in her mother; Heart disease in her brother; Hypertension in her father and mother; Kidney disease in her father; Leukemia in her paternal grandmother; Stroke in her maternal grandmother.  Laboratory Chemistry Profile   Renal Lab Results  Component Value Date  BUN 13 08/13/2022   CREATININE 0.82 08/13/2022   BCR  SEE NOTE: 08/13/2022   GFRAA 108 11/28/2020   GFRNONAA >60 05/17/2022    Hepatic Lab Results  Component Value Date   AST 11 08/13/2022   ALT 9 08/13/2022   ALBUMIN 4.1 05/17/2022   ALKPHOS 52 05/17/2022   AMYLASE 102 (H) 05/22/2016   LIPASE 13 04/01/2022    Electrolytes Lab Results  Component Value Date   NA 143 08/13/2022   K 3.9 08/13/2022   CL 107 08/13/2022   CALCIUM 10.2 08/13/2022   CALCIUM 10.2 08/13/2022   MG 1.5 09/15/2017    Bone Lab Results  Component Value Date   VD25OH 37 08/13/2022    Inflammation (CRP: Acute Phase) (ESR: Chronic Phase) Lab Results  Component Value Date   CRP 0.2 05/06/2020   ESRSEDRATE 2 05/06/2020         Note: Above Lab results reviewed.  Recent Imaging Review  MR CERVICAL SPINE WO CONTRAST CLINICAL DATA:  Chronic neck pain radiating to the left shoulder and upper back. Left arm numbness.  EXAM: MRI CERVICAL SPINE WITHOUT CONTRAST  TECHNIQUE: Multiplanar, multisequence MR imaging of the cervical spine was performed. No intravenous contrast was administered.  COMPARISON:  None Available.  FINDINGS: Axial sequences are moderately to severely motion degraded.  Alignment: Cervical spine straightening.  No listhesis.  Vertebrae: No fracture, suspicious marrow lesion, or significant marrow edema.  Cord: Normal signal.  Posterior Fossa, vertebral arteries, paraspinal tissues: Grossly unremarkable within limitations of motion artifact.  Disc levels:  C2-3: Negative.  C3-4: Minimal disc bulging and minimal uncovertebral spurring without significant stenosis.  C4-5: Minimal disc bulging and uncovertebral spurring without significant stenosis.  C5-6: Right eccentric disc bulging and uncovertebral spurring result in mild right neural foraminal stenosis without significant spinal stenosis.  C6-7: Minimal disc bulging without significant stenosis.  C7-T1: Mild facet arthrosis without significant  stenosis.  IMPRESSION: 1. Motion degraded examination. 2. Mild cervical spondylosis without significant spinal stenosis. 3. Mild right neural foraminal stenosis at C5-6.  Electronically Signed   By: Sebastian Ache M.D.   On: 07/20/2022 18:31 Note: Reviewed        Physical Exam  General appearance: Well nourished, well developed, and well hydrated. In no apparent acute distress Mental status: Alert, oriented x 3 (person, place, & time)       Respiratory: No evidence of acute respiratory distress Eyes: PERLA Vitals: BP 106/68 (BP Location: Right Arm, Patient Position: Sitting, Cuff Size: Normal)   Pulse 82   Temp (!) 97.3 F (36.3 C) (Temporal)   Ht 5\' 5"  (1.651 m)   Wt 185 lb (83.9 kg)   LMP 07/09/2015 (Exact Date)   SpO2 99%   BMI 30.79 kg/m  BMI: Estimated body mass index is 30.79 kg/m as calculated from the following:   Height as of this encounter: 5\' 5"  (1.651 m).   Weight as of this encounter: 185 lb (83.9 kg). Ideal: Ideal body weight: 57 kg (125 lb 10.6 oz) Adjusted ideal body weight: 67.8 kg (149 lb 6.4 oz)   Right middle and ring finger in brace  Assessment   Diagnosis Status  1. Chronic pain syndrome   2. Chronic pelvic pain in female   3. Endometriosis   4. Insomnia, persistent   5. Mild major depression (HCC)   6. Cervical radiculitis     Persistent Persistent Persistent    Plan of Care    Ms. JOSILYNN OLINSKI has a current  medication list which includes the following long-term medication(s): promethazine and valsartan-hydrochlorothiazide.  Pharmacotherapy (Medications Ordered): Meds ordered this encounter  Medications   tapentadol HCl (NUCYNTA) 75 MG tablet    Sig: Take 1 tablet (75 mg total) by mouth 2 (two) times daily as needed for moderate pain.    Dispense:  60 tablet    Refill:  0    Chronic pain syndrome   tapentadol HCl (NUCYNTA) 75 MG tablet    Sig: Take 1 tablet (75 mg total) by mouth 2 (two) times daily as needed for moderate  pain.    Dispense:  60 tablet    Refill:  0    Chronic pain syndrome   tapentadol HCl (NUCYNTA) 75 MG tablet    Sig: Take 1 tablet (75 mg total) by mouth 2 (two) times daily as needed for moderate pain.    Dispense:  60 tablet    Refill:  0    Chronic pain syndrome   Orders:  No orders of the defined types were placed in this encounter.  Follow-up plan:   Return in about 3 months (around 01/13/2023) for Medication Management, in person.    Recent Visits No visits were found meeting these conditions. Showing recent visits within past 90 days and meeting all other requirements Today's Visits Date Type Provider Dept  10/06/22 Office Visit Edward Jolly, MD [facility]  Showing today's visits and meeting all other requirements Future Appointments No visits were found meeting these conditions. Showing future appointments within next 90 days and meeting all other requirements  I discussed the assessment and treatment plan with the patient. The patient was provided an opportunity to ask questions and all were answered. The patient agreed with the plan and demonstrated an understanding of the instructions.  Patient advised to call back or seek an in-person evaluation if the symptoms or condition worsens.  Duration of encounter: .  Total time on encounter, as per AMA guidelines included both the face-to-face and non-face-to-face time personally spent by the physician and/or other qualified health care professional(s) on the day of the encounter (includes time in activities that require the physician or other qualified health care professional and does not include time in activities normally performed by clinical staff). Physician's time may include the following activities when performed: preparing to see the patient (eg, review of tests, pre-charting review of records) obtaining and/or reviewing separately obtained history performing a medically appropriate examination  and/or evaluation counseling and educating the patient/family/caregiver ordering medications, tests, or procedures referring and communicating with other health care professionals (when not separately reported) documenting clinical information in the electronic or other health record independently interpreting results (not separately reported) and communicating results to the patient/ family/caregiver care coordination (not separately reported)  Note by: Edward Jolly, MD Date: 10/06/2022; Time: 8:15 AM

## 2022-10-07 ENCOUNTER — Encounter: Payer: Self-pay | Admitting: Gastroenterology

## 2022-10-07 NOTE — Telephone Encounter (Signed)
The patient middle of her stomach that is a throbbing pain that is constant. She states the pain will let up some off and on but it does not let up much. The pain has been bad for 2 weeks. The pain is a 7/10 on the pain scale. Has had nausea but no vomiting. Has had constipation but had a bowel movement couple days ago. States she takes Amitiza and Miralax twice a week for the constipation. States she has bowel movements once a week if she lucky denies any blood in stool.

## 2022-10-08 ENCOUNTER — Encounter
Payer: Federal, State, Local not specified - PPO | Admitting: Student in an Organized Health Care Education/Training Program

## 2022-10-15 DIAGNOSIS — M65331 Trigger finger, right middle finger: Secondary | ICD-10-CM | POA: Diagnosis not present

## 2022-11-08 ENCOUNTER — Encounter: Payer: Self-pay | Admitting: Student in an Organized Health Care Education/Training Program

## 2022-11-09 ENCOUNTER — Telehealth: Payer: Self-pay

## 2022-11-09 NOTE — Telephone Encounter (Signed)
Stacey Stanley  P Pm-Clinical (supporting Edward Jolly, MD)11 hours ago (8:46 PM)    Hi Dr Cherylann Ratel I'm currently taking methyl prednisone for post operative surgery swelling. I am having extreme pelvic pain and would like to know if I can refill my nucynta earlier. I am not taking or receiving any other narcotics post op. I am only taking mexloxicam. Thanks

## 2022-11-10 DIAGNOSIS — H35413 Lattice degeneration of retina, bilateral: Secondary | ICD-10-CM | POA: Diagnosis not present

## 2022-11-10 DIAGNOSIS — H02882 Meibomian gland dysfunction right lower eyelid: Secondary | ICD-10-CM | POA: Diagnosis not present

## 2022-11-10 DIAGNOSIS — H1045 Other chronic allergic conjunctivitis: Secondary | ICD-10-CM | POA: Diagnosis not present

## 2022-11-10 DIAGNOSIS — H02885 Meibomian gland dysfunction left lower eyelid: Secondary | ICD-10-CM | POA: Diagnosis not present

## 2022-11-10 NOTE — Telephone Encounter (Signed)
Left patient a message to call the office to see if she was taking APAP post surgery and when she got her last Nucynta filled.  Will discuss with Dr Cherylann Ratel when I find out information.

## 2022-11-11 DIAGNOSIS — H43823 Vitreomacular adhesion, bilateral: Secondary | ICD-10-CM | POA: Diagnosis not present

## 2022-11-11 DIAGNOSIS — H33322 Round hole, left eye: Secondary | ICD-10-CM | POA: Diagnosis not present

## 2022-11-11 DIAGNOSIS — H31091 Other chorioretinal scars, right eye: Secondary | ICD-10-CM | POA: Diagnosis not present
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
## 2022-11-18 ENCOUNTER — Encounter: Payer: Self-pay | Admitting: Family Medicine

## 2022-11-18 ENCOUNTER — Telehealth: Payer: Self-pay | Admitting: Student in an Organized Health Care Education/Training Program

## 2022-11-18 ENCOUNTER — Telehealth (INDEPENDENT_AMBULATORY_CARE_PROVIDER_SITE_OTHER): Payer: Federal, State, Local not specified - PPO | Admitting: Family Medicine

## 2022-11-18 VITALS — Wt 195.0 lb

## 2022-11-18 DIAGNOSIS — F325 Major depressive disorder, single episode, in full remission: Secondary | ICD-10-CM

## 2022-11-18 DIAGNOSIS — I10 Essential (primary) hypertension: Secondary | ICD-10-CM

## 2022-11-18 DIAGNOSIS — E8881 Metabolic syndrome: Secondary | ICD-10-CM

## 2022-11-18 DIAGNOSIS — E669 Obesity, unspecified: Secondary | ICD-10-CM | POA: Diagnosis not present

## 2022-11-18 DIAGNOSIS — Z8673 Personal history of transient ischemic attack (TIA), and cerebral infarction without residual deficits: Secondary | ICD-10-CM

## 2022-11-18 DIAGNOSIS — G4700 Insomnia, unspecified: Secondary | ICD-10-CM

## 2022-11-18 DIAGNOSIS — Z6832 Body mass index (BMI) 32.0-32.9, adult: Secondary | ICD-10-CM

## 2022-11-18 DIAGNOSIS — F411 Generalized anxiety disorder: Secondary | ICD-10-CM

## 2022-11-18 DIAGNOSIS — E66811 Obesity, class 1: Secondary | ICD-10-CM

## 2022-11-18 DIAGNOSIS — G894 Chronic pain syndrome: Secondary | ICD-10-CM

## 2022-11-18 MED ORDER — WEGOVY 0.25 MG/0.5ML ~~LOC~~ SOAJ: 0.2500 mg | SUBCUTANEOUS | 0 refills | Status: DC

## 2022-11-18 MED ORDER — VALSARTAN-HYDROCHLOROTHIAZIDE 160-12.5 MG PO TABS: 1.0000 | ORAL_TABLET | Freq: Every day | ORAL | 0 refills | Status: DC

## 2022-11-18 NOTE — Telephone Encounter (Signed)
[facility], they report that the Rx for Nucynta is there for today and next month. Message left on patient's voicemail.

## 2022-11-18 NOTE — Telephone Encounter (Signed)
Patient called to get script for Nucynta filled, due today, was told they do not have any scripts for her to fill. Please call pharmacy and find out the problem.

## 2022-11-20 ENCOUNTER — Telehealth: Payer: Self-pay

## 2022-11-20 DIAGNOSIS — H33322 Round hole, left eye: Secondary | ICD-10-CM | POA: Diagnosis not present

## 2022-11-20 NOTE — Telephone Encounter (Signed)
Spoke with patient and she expressed behavioral modifications along with an increase in physical activity and diet modifications that have failed over the last 3 months in her efforts to lose weight. She would like to try [facility] and her insurance requires the above to be documented.

## 2022-11-25 DIAGNOSIS — L853 Xerosis cutis: Secondary | ICD-10-CM | POA: Diagnosis not present

## 2022-11-25 DIAGNOSIS — L905 Scar conditions and fibrosis of skin: Secondary | ICD-10-CM | POA: Diagnosis not present

## 2022-11-25 DIAGNOSIS — D225 Melanocytic nevi of trunk: Secondary | ICD-10-CM | POA: Diagnosis not present

## 2022-11-25 DIAGNOSIS — M65331 Trigger finger, right middle finger: Secondary | ICD-10-CM | POA: Diagnosis not present

## 2022-11-25 DIAGNOSIS — L821 Other seborrheic keratosis: Secondary | ICD-10-CM | POA: Diagnosis not present

## 2022-12-08 ENCOUNTER — Encounter: Payer: Self-pay | Admitting: Family Medicine

## 2022-12-08 DIAGNOSIS — R002 Palpitations: Secondary | ICD-10-CM | POA: Diagnosis not present

## 2022-12-08 DIAGNOSIS — I499 Cardiac arrhythmia, unspecified: Secondary | ICD-10-CM | POA: Diagnosis not present

## 2022-12-08 DIAGNOSIS — R079 Chest pain, unspecified: Secondary | ICD-10-CM | POA: Diagnosis not present

## 2022-12-08 DIAGNOSIS — I213 ST elevation (STEMI) myocardial infarction of unspecified site: Secondary | ICD-10-CM | POA: Diagnosis not present

## 2022-12-13 ENCOUNTER — Emergency Department (HOSPITAL_BASED_OUTPATIENT_CLINIC_OR_DEPARTMENT_OTHER)
Admission: EM | Admit: 2022-12-13 | Discharge: 2022-12-13 | Disposition: A | Discharge status: Home / Self Care | Payer: Federal, State, Local not specified - PPO | Attending: Emergency Medicine | Admitting: Emergency Medicine

## 2022-12-13 ENCOUNTER — Encounter (HOSPITAL_BASED_OUTPATIENT_CLINIC_OR_DEPARTMENT_OTHER): Payer: Self-pay | Admitting: Emergency Medicine

## 2022-12-13 ENCOUNTER — Other Ambulatory Visit: Payer: Self-pay

## 2022-12-13 ENCOUNTER — Emergency Department (HOSPITAL_BASED_OUTPATIENT_CLINIC_OR_DEPARTMENT_OTHER): Payer: Federal, State, Local not specified - PPO | Admitting: Radiology

## 2022-12-13 DIAGNOSIS — Z79899 Other long term (current) drug therapy: Secondary | ICD-10-CM | POA: Diagnosis not present

## 2022-12-13 DIAGNOSIS — I10 Essential (primary) hypertension: Secondary | ICD-10-CM | POA: Diagnosis not present

## 2022-12-13 DIAGNOSIS — R002 Palpitations: Secondary | ICD-10-CM | POA: Diagnosis not present

## 2022-12-13 DIAGNOSIS — E119 Type 2 diabetes mellitus without complications: Secondary | ICD-10-CM | POA: Diagnosis not present

## 2022-12-13 DIAGNOSIS — R55 Syncope and collapse: Secondary | ICD-10-CM | POA: Diagnosis not present

## 2022-12-13 DIAGNOSIS — Z7982 Long term (current) use of aspirin: Secondary | ICD-10-CM | POA: Diagnosis not present

## 2022-12-13 DIAGNOSIS — R42 Dizziness and giddiness: Secondary | ICD-10-CM | POA: Diagnosis not present

## 2022-12-13 LAB — BASIC METABOLIC PANEL
Anion gap: 9 (ref 5–15)
BUN: 12 mg/dL (ref 6–20)
CO2: 26 mmol/L (ref 22–32)
Calcium: 10.5 mg/dL — ABNORMAL HIGH (ref 8.9–10.3)
Chloride: 104 mmol/L (ref 98–111)
Creatinine, Ser: 0.8 mg/dL (ref 0.44–1.00)
GFR, Estimated: >60 mL/min (ref >60)
Glucose, Bld: 103 mg/dL — ABNORMAL HIGH (ref 70–99)
Potassium: 3.6 mmol/L (ref 3.5–5.1)
Sodium: 139 mmol/L (ref 135–145)

## 2022-12-13 LAB — CBC
HCT: 43.6 % (ref 36.0–46.0)
Hemoglobin: 14.8 g/dL (ref 12.0–15.0)
MCH: 29.1 pg (ref 26.0–34.0)
MCHC: 33.9 g/dL (ref 30.0–36.0)
MCV: 85.7 fL (ref 80.0–100.0)
Platelets: 303 10*3/uL (ref 150–400)
RBC: 5.09 MIL/uL (ref 3.87–5.11)
RDW: 12.6 % (ref 11.5–15.5)
WBC: 7.1 10*3/uL (ref 4.0–10.5)
nRBC: 0 % (ref 0.0–0.2)

## 2022-12-13 LAB — D-DIMER, QUANTITATIVE: D-Dimer, Quant: <0.27 ug/mL-FEU (ref 0.00–0.50)

## 2022-12-13 LAB — TROPONIN I (HIGH SENSITIVITY): Troponin I (High Sensitivity): <2 ng/L (ref <18)

## 2022-12-13 LAB — MAGNESIUM: Magnesium: 1.7 mg/dL (ref 1.7–2.4)

## 2022-12-13 NOTE — ED Provider Notes (Signed)
EMERGENCY DEPARTMENT AT [facility] Provider Note   CSN: [id number] Arrival date & time: 12/13/22  2841     History  Chief Complaint  Patient presents with   Near Syncope   Chest Pain    Stacey Stanley is a 47 y.o. female.   Near Syncope Associated symptoms include chest pain.  Chest Pain Associated symptoms: near-syncope and palpitations      47 year old female with medical history significant for hypertension, SVT, GERD, depression, obesity, DM 2, thyroid nodule, TIA who presents to the emergency department with multiple episodes of heart palpitations and lightheadedness.  The patient states that earlier this week she had an episode of heart palpitations and lightheadedness and called EMS.  She states that her heart rate was elevated but is not sure how high it was at the time.  She initially declined transport at that time.  She had another episode this morning when she was in the [place] parking lot with lightheadedness and a sensation of heart palpitations.  Symptoms have since resolved.  She endorsed some dull discomfort in her chest over the past week.  No changes acutely today.  She also felt some cramping in her left thigh, no lower extremity swelling.  It felt similar to prior episodes of hypokalemia.  She has previously been on metoprolol and about 12 years ago had a Holter monitor placed.  She is currently on losartan hydrochlorothiazide and no longer on metoprolol.  No numbness, weakness, facial droop, difficulty speaking or swallowing.  Home Medications Prior to Admission medications   Medication Sig Start Date End Date Taking? Authorizing Provider  acetaminophen (TYLENOL) 500 MG tablet Take 500 mg by mouth every 6 (six) hours as needed for mild pain.    [provider]  aspirin 81 MG chewable tablet Chew 1 tablet (81 mg total) by mouth daily. 12/12/21   Karie Mainland, Amjad, PA-C  clindamycin (CLEOCIN T) 1 % lotion Apply topically every morning.  08/03/22   [provider]  Dapsone 5 % topical gel Apply 60 g topically daily. 09/21/19   [provider]  hydrOXYzine (ATARAX) 10 MG tablet Take 1-2 tablets (10-20 mg total) by mouth at bedtime. 05/15/22   Alba Cory, MD  ibuprofen (ADVIL) 200 MG tablet Take 200 mg by mouth every 6 (six) hours as needed for mild pain.    [provider]  imiquimod (ALDARA) 5 % cream Apply 1 packet topically daily as needed (warts). 09/21/19   [provider]  lubiprostone (AMITIZA) 24 MCG capsule Take 1 capsule (24 mcg total) by mouth 2 (two) times daily with a meal. 08/19/22   Vanga, Loel Dubonnet, MD  mupirocin ointment (BACTROBAN) 2 % Apply topically. 08/03/22   [provider]  Omega-3 Fatty Acids (FISH OIL) 1000 MG CAPS Take 2,000 mg by mouth daily.    [provider]  ondansetron (ZOFRAN ODT) 4 MG disintegrating tablet Take 1 tablet (4 mg total) by mouth every 8 (eight) hours as needed for nausea or vomiting. 08/19/22   Vanga, Loel Dubonnet, MD  Semaglutide-Weight Management (WEGOVY) 0.25 MG/0.5ML SOAJ Inject 0.25 mg into the skin once a week. 11/18/22   Alba Cory, MD  tapentadol HCl (NUCYNTA) 75 MG tablet Take 1 tablet (75 mg total) by mouth 2 (two) times daily as needed for moderate pain. 11/18/22 12/18/22  Edward Jolly, MD  tapentadol HCl (NUCYNTA) 75 MG tablet Take 1 tablet (75 mg total) by mouth 2 (two) times daily as needed for moderate pain.  12/18/22 01/17/23  Edward Jolly, MD  TRI-LUMA 0.01-4-0.05 % CREA Apply 30 mg topically daily. 09/21/19   [provider]  TRULANCE 3 MG TABS Take 1 tablet by mouth daily as needed (constipation). 09/29/21   [provider]  valsartan-hydrochlorothiazide (DIOVAN-HCT) 160-12.5 MG tablet Take 1 tablet by mouth daily. 11/18/22   Alba Cory, MD      Allergies    Nuvigil [armodafinil] and Ppd [tuberculin purified protein derivative]    Review of Systems   Review of Systems  Cardiovascular:   Positive for chest pain, palpitations and near-syncope.  All other systems reviewed and are negative.   Physical Exam Updated Vital Signs BP 119/87   Pulse 86   Temp 97.7 F (36.5 C) (Oral)   Resp 16   LMP 07/09/2015 (Exact Date)   SpO2 100%  Physical Exam Vitals and nursing note reviewed.  Constitutional:      General: She is not in acute distress.    Appearance: She is well-developed.  HENT:     Head: Normocephalic and atraumatic.  Eyes:     Conjunctiva/sclera: Conjunctivae normal.  Cardiovascular:     Rate and Rhythm: Normal rate and regular rhythm.  Pulmonary:     Effort: Pulmonary effort is normal. No respiratory distress.     Breath sounds: Normal breath sounds.  Abdominal:     Palpations: Abdomen is soft.     Tenderness: There is no abdominal tenderness.  Musculoskeletal:     Cervical back: Neck supple.     Right lower leg: No edema.     Left lower leg: No edema.  Skin:    General: Skin is warm and dry.     Capillary Refill: Capillary refill takes less than 2 seconds.  Neurological:     Mental Status: She is alert.     Comments: MENTAL STATUS EXAM:    Orientation: Alert and oriented to person, place and time.  Memory: Cooperative, follows commands well.  Language: Speech is clear and language is normal.   CRANIAL NERVES:    CN 2 (Optic): Visual fields intact to confrontation.  CN 3,4,6 (EOM): Pupils equal and reactive to light. Full extraocular eye movement without nystagmus.  CN 5 (Trigeminal): Facial sensation is normal, no weakness of masticatory muscles.  CN 7 (Facial): No facial weakness or asymmetry.  CN 8 (Auditory): Auditory acuity grossly normal.  CN 9,10 (Glossophar): The uvula is midline, the palate elevates symmetrically.  CN 11 (spinal access): Normal sternocleidomastoid and trapezius strength.  CN 12 (Hypoglossal): The tongue is midline. No atrophy or fasciculations.Marland Kitchen   MOTOR:  Muscle Strength: 5/5RUE, 5/5LUE, 5/5RLE, 5/5LLE.    COORDINATION:   No tremor.   SENSATION:   Intact to light touch all four extremities.    Psychiatric:        Mood and Affect: Mood normal.     ED Results / Procedures / Treatments   Labs (all labs ordered are listed, but only abnormal results are displayed) Labs Reviewed  BASIC METABOLIC PANEL - Abnormal; Notable for the following components:      Result Value   Glucose, Bld 103 (*)    Calcium 10.5 (*)    All other components within normal limits  CBC  MAGNESIUM  D-DIMER, QUANTITATIVE  TROPONIN I (HIGH SENSITIVITY)    EKG EKG Interpretation Date/Time:  Sunday December 13 2022 07:53:55 EDT Ventricular Rate:  80 PR Interval:  144 QRS Duration:  81 QT Interval:  374 QTC Calculation: 432 R Axis:  49  Text Interpretation: Sinus rhythm Confirmed by Ernie Avena ([id number]) on 12/13/2022 8:34:53 AM  Radiology No results found.  Procedures Procedures    Medications Ordered in ED Medications - No data to display  ED Course/ Medical Decision Making/ A&P                             Medical Decision Making Amount and/or Complexity of Data Reviewed Labs: ordered. Radiology: ordered.     47 year old female with medical history significant for hypertension, SVT, GERD, depression, obesity, DM 2, thyroid nodule, TIA who presents to the emergency department with multiple episodes of heart palpitations and lightheadedness.  The patient states that earlier this week she had an episode of heart palpitations and lightheadedness and called EMS.  She states that her heart rate was elevated but is not sure how high it was at the time.  She initially declined transport at that time.  She had another episode this morning when she was in the [place] parking lot with lightheadedness and a sensation of heart palpitations.  Symptoms have since resolved.  She endorsed some dull discomfort in her chest over the past week.  No changes acutely today.  She also felt some cramping in her left thigh,  no lower extremity swelling.  It felt similar to prior episodes of hypokalemia.  She has previously been on metoprolol and about 12 years ago had a Holter monitor placed.  She is currently on losartan hydrochlorothiazide and no longer on metoprolol.  No numbness, weakness, facial droop, difficulty speaking or swallowing.  On arrival, the patient was vitally stable, afebrile, not tachycardic or tachypneic, saturating well on room air, BP 118/95.  Patient presenting with intermittent episodes of heart palpitations and lightheadedness.  No full syncope.  Some dull discomfort in her chest over the past week which has not changed in character over the past several days.  Differential diagnosis includes SVT, PE, anxiety, electrolyte abnormality.  Considered orthostatic hypotension, vasovagal near syncope.  Initial EKG revealed sinus rhythm, ventricular rate 8 0, no acute ischemic changes and no abnormal intervals.  Chest x-ray revealed clear lungs on my evaluation.  Laboratory evaluation significant for initial troponin less than 2, D-dimer negative CBC without a leukocytosis or anemia, magnesium normal, BMP without electrolyte abnormality other than mild hypercalcemia to 10.5.  The patient's dull chest discomfort has been ongoing for the past week, with a negative troponin and reassuring EKG, low concern for ACS.  With a negative D-dimer, low suspicion for DVT or PE.  No evidence of electrolyte abnormality.  Orthostatic vitals were collected in the emergency department and resulted negative.  The patient could be having episodes of SVT.  She was monitored in the emergency department for 2 hours with no further recurrence of symptoms.  Low suspicion for acute cardiac or intrathoracic abnormality requiring inpatient hospitalization or further workup at this time.  I recommended that the patient follow-up outpatient with her primary care provider for consideration for Holter monitor placement given her reassuring  workup today.  Overall stable at this time for discharge.  Final Clinical Impression(s) / ED Diagnoses Final diagnoses:  Palpitations  Near syncope    Rx / DC Orders ED Discharge Orders     None         Ernie Avena, MD 12/13/22 [phone]

## 2022-12-13 NOTE — Discharge Instructions (Addendum)
Your EKG, chest x-ray and laboratory evaluation was reassuring today.  Your symptoms could be due to recurrent episodes of intermittent SVT.  Follow-up with your PCP for consideration for cardiac monitor placement.

## 2022-12-13 NOTE — ED Triage Notes (Signed)
For about a week intermittent dizziness, no pattern. Pt had hand and eye surgery in the past month and was on steroids "for a while". Has finished steroids. Pt was just in [employer] before admission and she felt like she was going to pass out. Pt states she is feeling dizzy currently and that she has low level uncomfortable feeling left chest like her heart is beating fast, states she has had that feeling over the past week as well.

## 2022-12-13 NOTE — ED Notes (Signed)
Dc instructions reviewed with patient. Patient voiced understanding. Dc with belongings.  °

## 2022-12-13 NOTE — ED Triage Notes (Signed)
Pt also states she has had to be hospitalized in the  past for low potassium. And her left leg has also been cramping for a week.

## 2022-12-13 NOTE — ED Notes (Signed)
Pt also states she has had low potassium in  the past requiring hospitalization and has had left leg cramping for a week along with other symptoms.

## 2022-12-23 DIAGNOSIS — H43823 Vitreomacular adhesion, bilateral: Secondary | ICD-10-CM | POA: Diagnosis not present

## 2022-12-23 DIAGNOSIS — H35413 Lattice degeneration of retina, bilateral: Secondary | ICD-10-CM | POA: Diagnosis not present

## 2022-12-23 DIAGNOSIS — M65331 Trigger finger, right middle finger: Secondary | ICD-10-CM | POA: Diagnosis not present

## 2022-12-23 DIAGNOSIS — H31093 Other chorioretinal scars, bilateral: Secondary | ICD-10-CM | POA: Diagnosis not present

## 2022-12-27 ENCOUNTER — Emergency Department (HOSPITAL_BASED_OUTPATIENT_CLINIC_OR_DEPARTMENT_OTHER): Payer: Federal, State, Local not specified - PPO

## 2022-12-27 ENCOUNTER — Encounter (HOSPITAL_BASED_OUTPATIENT_CLINIC_OR_DEPARTMENT_OTHER): Payer: Self-pay | Admitting: *Deleted

## 2022-12-27 ENCOUNTER — Emergency Department (HOSPITAL_BASED_OUTPATIENT_CLINIC_OR_DEPARTMENT_OTHER)
Admission: EM | Admit: 2022-12-27 | Discharge: 2022-12-27 | Disposition: A | Discharge status: Home / Self Care | Payer: Federal, State, Local not specified - PPO | Attending: Emergency Medicine | Admitting: Emergency Medicine

## 2022-12-27 ENCOUNTER — Other Ambulatory Visit: Payer: Self-pay

## 2022-12-27 DIAGNOSIS — Z794 Long term (current) use of insulin: Secondary | ICD-10-CM | POA: Insufficient documentation

## 2022-12-27 DIAGNOSIS — I10 Essential (primary) hypertension: Secondary | ICD-10-CM | POA: Insufficient documentation

## 2022-12-27 DIAGNOSIS — E119 Type 2 diabetes mellitus without complications: Secondary | ICD-10-CM | POA: Insufficient documentation

## 2022-12-27 DIAGNOSIS — R0789 Other chest pain: Secondary | ICD-10-CM | POA: Diagnosis not present

## 2022-12-27 DIAGNOSIS — R079 Chest pain, unspecified: Secondary | ICD-10-CM | POA: Diagnosis not present

## 2022-12-27 DIAGNOSIS — Z7982 Long term (current) use of aspirin: Secondary | ICD-10-CM | POA: Insufficient documentation

## 2022-12-27 LAB — CBC WITH DIFFERENTIAL/PLATELET
Abs Immature Granulocytes: 0.01 10*3/uL (ref 0.00–0.07)
Basophils Absolute: 0 10*3/uL (ref 0.0–0.1)
Basophils Relative: 1 %
Eosinophils Absolute: 0.2 10*3/uL (ref 0.0–0.5)
Eosinophils Relative: 2 %
HCT: 40.6 % (ref 36.0–46.0)
Hemoglobin: 13.8 g/dL (ref 12.0–15.0)
Immature Granulocytes: 0 %
Lymphocytes Relative: 51 %
Lymphs Abs: 3.3 10*3/uL (ref 0.7–4.0)
MCH: 29.2 pg (ref 26.0–34.0)
MCHC: 34 g/dL (ref 30.0–36.0)
MCV: 86 fL (ref 80.0–100.0)
Monocytes Absolute: 0.3 10*3/uL (ref 0.1–1.0)
Monocytes Relative: 5 %
Neutro Abs: 2.6 10*3/uL (ref 1.7–7.7)
Neutrophils Relative %: 41 %
Platelets: 258 10*3/uL (ref 150–400)
RBC: 4.72 MIL/uL (ref 3.87–5.11)
RDW: 12.5 % (ref 11.5–15.5)
WBC: 6.3 10*3/uL (ref 4.0–10.5)
nRBC: 0 % (ref 0.0–0.2)

## 2022-12-27 LAB — BASIC METABOLIC PANEL
Anion gap: 7 (ref 5–15)
BUN: 10 mg/dL (ref 6–20)
CO2: 30 mmol/L (ref 22–32)
Calcium: 10.1 mg/dL (ref 8.9–10.3)
Chloride: 105 mmol/L (ref 98–111)
Creatinine, Ser: 0.81 mg/dL (ref 0.44–1.00)
GFR, Estimated: >60 mL/min (ref >60)
Glucose, Bld: 96 mg/dL (ref 70–99)
Potassium: 3.5 mmol/L (ref 3.5–5.1)
Sodium: 142 mmol/L (ref 135–145)

## 2022-12-27 LAB — TROPONIN I (HIGH SENSITIVITY): Troponin I (High Sensitivity): <2 ng/L (ref <18)

## 2022-12-27 MED ORDER — KETOROLAC TROMETHAMINE 30 MG/ML IJ SOLN
30.0000 mg | Freq: Once | INTRAMUSCULAR | Status: AC
  Administered 2022-12-27: 30 mg via INTRAVENOUS
  Filled 2022-12-27: qty 1

## 2022-12-27 MED ORDER — NAPROXEN 500 MG PO TABS: 500.0000 mg | ORAL_TABLET | Freq: Two times a day (BID) | ORAL | 0 refills | Status: DC

## 2022-12-27 NOTE — ED Notes (Signed)
Reviewed AVS with patient, patient expressed understanding of directions, denies further questions at this time. 

## 2022-12-27 NOTE — ED Triage Notes (Signed)
C/o chest pain for the past couple weeks. States she was sleeping and pain woke her up. States pain comes and goes. Describes as sharp. Has taken aspirin and ibuprofen with no relief. Also took antacids. Denies any sob or n/v. Has been seen for same symptoms by the ED.

## 2022-12-27 NOTE — Discharge Instructions (Signed)
Begin taking naproxen as prescribed.  Rest.  Follow-up with your primary doctor if not improving in the next week.  Return to the ER if symptoms significantly worsen or change.

## 2022-12-27 NOTE — ED Provider Notes (Signed)
[place] EMERGENCY DEPARTMENT AT [facility] Provider Note   CSN: [id number] Arrival date & time: 12/27/22  0357     History  Chief Complaint  Patient presents with   Chest Pain    Stacey Stanley is a 47 y.o. female.  Patient is a 47 year old female with past medical history of hypertension, tachycardia, GERD, diabetes.  Patient presenting today for evaluation of chest discomfort.  She describes a 1 month history of intermittent pains just behind her left breast.  This has been occurring several times daily, and begin in the absence of any precipitating factors.  There is no relation to food or exertion.  She denies shortness of breath, nausea, diaphoresis, or radiation of the pain.  She was seen here 2 weeks ago with similar complaints and workup was unremarkable.  She reports a history of having had a stress test probably 15 years ago that she tells me was normal.  The history is provided by the patient.       Home Medications Prior to Admission medications   Medication Sig Start Date End Date Taking? Authorizing Provider  acetaminophen (TYLENOL) 500 MG tablet Take 500 mg by mouth every 6 (six) hours as needed for mild pain.    [provider]  aspirin 81 MG chewable tablet Chew 1 tablet (81 mg total) by mouth daily. 12/12/21   Karie Mainland, Amjad, PA-C  clindamycin (CLEOCIN T) 1 % lotion Apply topically every morning. 08/03/22   [provider]  Dapsone 5 % topical gel Apply 60 g topically daily. 09/21/19   [provider]  ibuprofen (ADVIL) 200 MG tablet Take 200 mg by mouth every 6 (six) hours as needed for mild pain.    [provider]  imiquimod (ALDARA) 5 % cream Apply 1 packet topically daily as needed (warts). 09/21/19   [provider]  mupirocin ointment (BACTROBAN) 2 % Apply topically. 08/03/22   [provider]  Omega-3 Fatty Acids (FISH OIL) 1000 MG CAPS Take 2,000 mg by mouth daily.    [provider]   Semaglutide-Weight Management (WEGOVY) 0.25 MG/0.5ML SOAJ Inject 0.25 mg into the skin once a week. 11/18/22   Alba Cory, MD  tapentadol HCl (NUCYNTA) 75 MG tablet Take 1 tablet (75 mg total) by mouth 2 (two) times daily as needed for moderate pain. 12/18/22 01/17/23  Edward Jolly, MD  TRI-LUMA 0.01-4-0.05 % CREA Apply 30 mg topically daily. 09/21/19   [provider]  TRULANCE 3 MG TABS Take 1 tablet by mouth daily as needed (constipation). 09/29/21   [provider]  valsartan-hydrochlorothiazide (DIOVAN-HCT) 160-12.5 MG tablet Take 1 tablet by mouth daily. 11/18/22   Alba Cory, MD      Allergies    Nuvigil [armodafinil] and Ppd [tuberculin purified protein derivative]    Review of Systems   Review of Systems  All other systems reviewed and are negative.   Physical Exam Updated Vital Signs BP 117/84   Pulse 78   Temp 98.7 F (37.1 C)   Resp 12   Ht 5\' 5"  (1.651 m)   Wt 86.2 kg   LMP 07/09/2015 (Exact Date)   SpO2 99%   BMI 31.62 kg/m  Physical Exam Vitals and nursing note reviewed.  Constitutional:      General: She is not in acute distress.    Appearance: She is well-developed. She is not diaphoretic.  HENT:     Head: Normocephalic and atraumatic.  Cardiovascular:     Rate and Rhythm:  Normal rate and regular rhythm.     Heart sounds: No murmur heard.    No friction rub. No gallop.  Pulmonary:     Effort: Pulmonary effort is normal. No respiratory distress.     Breath sounds: Normal breath sounds. No wheezing.  Abdominal:     General: Bowel sounds are normal. There is no distension.     Palpations: Abdomen is soft.     Tenderness: There is no abdominal tenderness.  Musculoskeletal:        General: Normal range of motion.     Cervical back: Normal range of motion and neck supple.     Right lower leg: No tenderness. No edema.     Left lower leg: No tenderness. No edema.  Skin:    General: Skin is warm and dry.  Neurological:     General:  No focal deficit present.     Mental Status: She is alert and oriented to person, place, and time.     ED Results / Procedures / Treatments   Labs (all labs ordered are listed, but only abnormal results are displayed) Labs Reviewed  CBC WITH DIFFERENTIAL/PLATELET  BASIC METABOLIC PANEL  TROPONIN I (HIGH SENSITIVITY)    EKG EKG Interpretation Date/Time:  Sunday December 27 2022 04:07:07 EDT Ventricular Rate:  79 PR Interval:  156 QRS Duration:  82 QT Interval:  368 QTC Calculation: 422 R Axis:   58  Text Interpretation: Sinus rhythm RSR' in V1 or V2, probably normal variant No significant change since 12/13/2022 Confirmed by Geoffery Lyons ([id number]) on 12/27/2022 4:19:20 AM  Radiology No results found.  Procedures Procedures    Medications Ordered in ED Medications - No data to display  ED Course/ Medical Decision Making/ A&P  Patient presenting here with a 1 month history of pain behind her left breast that is sharp in nature.  Patient arrives here with stable vital signs and is afebrile.  There is no hypoxia.  Workup initiated including CBC, metabolic panel, and troponin, all of which are unremarkable.  Chest x-ray is negative.  Patient given IV Toradol and seems to be feeling somewhat better.  Symptoms seem very atypical for cardiac pain.  With a 1 month history of symptoms that occur intermittently and negative workup, I feel as though patient can safely be discharged.  I will have her follow-up with her primary doctor if not improving.  She will be treated with naproxen and rest.  Final Clinical Impression(s) / ED Diagnoses Final diagnoses:  None    Rx / DC Orders ED Discharge Orders     None         Geoffery Lyons, MD 12/27/22 [phone]

## 2022-12-28 ENCOUNTER — Other Ambulatory Visit: Payer: Self-pay

## 2022-12-28 ENCOUNTER — Encounter (HOSPITAL_COMMUNITY): Payer: Self-pay

## 2022-12-28 ENCOUNTER — Emergency Department (HOSPITAL_COMMUNITY)
Admission: EM | Admit: 2022-12-28 | Discharge: 2022-12-28 | Disposition: A | Discharge status: Home / Self Care | Payer: Federal, State, Local not specified - PPO | Attending: Emergency Medicine | Admitting: Emergency Medicine

## 2022-12-28 DIAGNOSIS — Z7982 Long term (current) use of aspirin: Secondary | ICD-10-CM | POA: Insufficient documentation

## 2022-12-28 DIAGNOSIS — E119 Type 2 diabetes mellitus without complications: Secondary | ICD-10-CM | POA: Insufficient documentation

## 2022-12-28 DIAGNOSIS — I10 Essential (primary) hypertension: Secondary | ICD-10-CM | POA: Insufficient documentation

## 2022-12-28 DIAGNOSIS — R519 Headache, unspecified: Secondary | ICD-10-CM | POA: Diagnosis not present

## 2022-12-28 DIAGNOSIS — R42 Dizziness and giddiness: Secondary | ICD-10-CM | POA: Diagnosis not present

## 2022-12-28 DIAGNOSIS — Z79899 Other long term (current) drug therapy: Secondary | ICD-10-CM | POA: Insufficient documentation

## 2022-12-28 LAB — CBC
HCT: 43.2 % (ref 36.0–46.0)
Hemoglobin: 14 g/dL (ref 12.0–15.0)
MCH: 28.1 pg (ref 26.0–34.0)
MCHC: 32.4 g/dL (ref 30.0–36.0)
MCV: 86.6 fL (ref 80.0–100.0)
Platelets: 299 10*3/uL (ref 150–400)
RBC: 4.99 MIL/uL (ref 3.87–5.11)
RDW: 12.2 % (ref 11.5–15.5)
WBC: 7.5 10*3/uL (ref 4.0–10.5)
nRBC: 0 % (ref 0.0–0.2)

## 2022-12-28 LAB — BASIC METABOLIC PANEL
Anion gap: 13 (ref 5–15)
BUN: 8 mg/dL (ref 6–20)
CO2: 25 mmol/L (ref 22–32)
Calcium: 9.8 mg/dL (ref 8.9–10.3)
Chloride: 101 mmol/L (ref 98–111)
Creatinine, Ser: 0.92 mg/dL (ref 0.44–1.00)
GFR, Estimated: >60 mL/min (ref >60)
Glucose, Bld: 88 mg/dL (ref 70–99)
Potassium: 4 mmol/L (ref 3.5–5.1)
Sodium: 139 mmol/L (ref 135–145)

## 2022-12-28 MED ORDER — DROPERIDOL 2.5 MG/ML IJ SOLN
2.5000 mg | Freq: Once | INTRAMUSCULAR | Status: AC
  Administered 2022-12-28: 2.5 mg via INTRAVENOUS
  Filled 2022-12-28: qty 2

## 2022-12-28 MED ORDER — KETOROLAC TROMETHAMINE 15 MG/ML IJ SOLN
15.0000 mg | Freq: Once | INTRAMUSCULAR | Status: AC
  Administered 2022-12-28: 15 mg via INTRAVENOUS
  Filled 2022-12-28: qty 1

## 2022-12-28 NOTE — ED Triage Notes (Signed)
Reports headache and dizziness x 3 weeks.  Has been seen 2 times this month for multiple other complaints.  Reports "the left side of my body is just not right". Denies vomiting and blurred vision.  Reports she can hear her heart beat in left ear.

## 2022-12-28 NOTE — Discharge Instructions (Addendum)
You were seen in the emergency department today for headache and dizziness.  Your blood work today was reassuring.  We initially had ordered CT imaging, but you felt improved after the medications given.  I recommend following up with the neurologist if you continue to have symptoms.  You can contact the office which are previously seen, but have also attached the contact information for an office we work with if you would prefer to see them.  Continue to monitor how you're doing and return to the ER for new or worsening symptoms.

## 2022-12-28 NOTE — ED Provider Notes (Signed)
[place] EMERGENCY DEPARTMENT AT [facility] Provider Note   CSN: [id number] Arrival date & time: 12/28/22  1045     History  Chief Complaint  Patient presents with   Headache   Dizziness    Stacey Stanley is a 47 y.o. female with history of endometriosis, HTN, anxiety, GERD, insomnia, depression, diabetes, chronic pain syndrome (sees pain management), who presents to the ER complaining of headache and dizziness x 3 weeks. Describes it as "the left side of my body is just not right" with associated pain. Denies vomiting or blurry vision. Feels like she can hear her heart beating in her left ear.  She states that she is also been having intermittent chest pain and palpitations, but had none of this today.  States that she was sitting down on her couch when she felt like she was "shot in the back of the head" and had severe pain.  Her pain is somewhat relieved, but still has dizziness.  She describes her dizziness as feeling as though she is going to pass out, but has not done so.  She reports having previously been told she might of had a TIA, and had workup done for this back in 2023.  She had reassuring imaging, and followed up with a neurologist.  The neurologist did not believe she had a TIA, and took her off the previously prescribed medications.   Headache Associated symptoms: dizziness   Dizziness Associated symptoms: headaches        Home Medications Prior to Admission medications   Medication Sig Start Date End Date Taking? Authorizing Provider  acetaminophen (TYLENOL) 500 MG tablet Take 500 mg by mouth every 6 (six) hours as needed for mild pain.    [provider]  aspirin 81 MG chewable tablet Chew 1 tablet (81 mg total) by mouth daily. 12/12/21   Karie Mainland, Amjad, PA-C  clindamycin (CLEOCIN T) 1 % lotion Apply topically every morning. 08/03/22   [provider]  Dapsone 5 % topical gel Apply 60 g topically daily. 09/21/19   [provider]   ibuprofen (ADVIL) 200 MG tablet Take 200 mg by mouth every 6 (six) hours as needed for mild pain.    [provider]  imiquimod (ALDARA) 5 % cream Apply 1 packet topically daily as needed (warts). 09/21/19   [provider]  mupirocin ointment (BACTROBAN) 2 % Apply topically. 08/03/22   [provider]  naproxen (NAPROSYN) 500 MG tablet Take 1 tablet (500 mg total) by mouth 2 (two) times daily. 12/27/22   Geoffery Lyons, MD  Omega-3 Fatty Acids (FISH OIL) 1000 MG CAPS Take 2,000 mg by mouth daily.    [provider]  Semaglutide-Weight Management (WEGOVY) 0.25 MG/0.5ML SOAJ Inject 0.25 mg into the skin once a week. 11/18/22   Alba Cory, MD  tapentadol HCl (NUCYNTA) 75 MG tablet Take 1 tablet (75 mg total) by mouth 2 (two) times daily as needed for moderate pain. 12/18/22 01/17/23  Edward Jolly, MD  TRI-LUMA 0.01-4-0.05 % CREA Apply 30 mg topically daily. 09/21/19   [provider]  TRULANCE 3 MG TABS Take 1 tablet by mouth daily as needed (constipation). 09/29/21   [provider]  valsartan-hydrochlorothiazide (DIOVAN-HCT) 160-12.5 MG tablet Take 1 tablet by mouth daily. 11/18/22   Alba Cory, MD      Allergies    Nuvigil [armodafinil] and Ppd [tuberculin purified protein derivative]    Review of Systems   Review of Systems  Neurological:  Positive for dizziness and headaches.  All other systems reviewed and are negative.   Physical Exam Updated Vital Signs BP (!) 135/91 (BP Location: Right Arm)   Pulse (!) 102   Temp 98.3 F (36.8 C) (Oral)   Resp 16   Ht 5\' 5"  (1.651 m)   Wt 86.2 kg   LMP 07/09/2015 (Exact Date)   SpO2 100%   BMI 31.62 kg/m  Physical Exam Vitals and nursing note reviewed.  Constitutional:      Appearance: Normal appearance.  HENT:     Head: Normocephalic and atraumatic.  Eyes:     Conjunctiva/sclera: Conjunctivae normal.  Cardiovascular:     Rate and Rhythm: Normal rate and regular rhythm.   Pulmonary:     Effort: Pulmonary effort is normal. No respiratory distress.     Breath sounds: Normal breath sounds.  Abdominal:     General: There is no distension.     Palpations: Abdomen is soft.     Tenderness: There is no abdominal tenderness.  Musculoskeletal:     Comments: Compartments of the extremities are soft, normal ROM of arms and legs  Skin:    General: Skin is warm and dry.  Neurological:     General: No focal deficit present.     Mental Status: She is alert.     Comments: Neuro: Speech is clear, able to follow commands. CN III-XII intact grossly intact. PERRLA. EOMI. Sensation intact throughout. Str 5/5 all extremities.    ED Results / Procedures / Treatments   Labs (all labs ordered are listed, but only abnormal results are displayed) Labs Reviewed  CBC  BASIC METABOLIC PANEL    EKG None  Radiology DG Chest [facility] 1 View  Result Date: 12/27/2022 CLINICAL DATA:  47 year old female with chest pain.  Woken by pain. EXAM: PORTABLE CHEST 1 VIEW COMPARISON:  Chest radiograph 12/13/2022 and earlier. FINDINGS: Portable AP view at 0435 hours. Stable lung volumes at the upper limits of normal. Normal cardiac size and mediastinal contours. Visualized tracheal air column is within normal limits. Allowing for portable technique the lungs are clear. No pneumothorax or pleural effusion. Negative visible bowel gas and osseous structures. IMPRESSION: Negative portable chest. Electronically Signed   By: Odessa Fleming M.D.   On: 12/27/2022 04:43    Procedures Procedures    Medications Ordered in ED Medications  ketorolac (TORADOL) 15 MG/ML injection 15 mg (15 mg Intravenous Given 12/28/22 1337)  droperidol (INAPSINE) 2.5 MG/ML injection 2.5 mg (2.5 mg Intravenous Given 12/28/22 1337)    ED Course/ Medical Decision Making/ A&P                             Medical Decision Making  This patient is a 47 y.o. female  who presents to the ED for concern of persistent dizziness x 3  weeks, worsening headache today.   Differential diagnoses prior to evaluation: The emergent differential diagnosis includes, but is not limited to,  BPPV, vestibular migraine, head trauma, AVM, intracranial tumor, multiple sclerosis, drug-related, CVA, vasovagal syncope, orthostatic hypotension, sepsis, hypoglycemia, electrolyte disturbance, anemia, anxiety/panic attack. This is not an exhaustive differential.   Past Medical History / Co-morbidities / Social History: endometriosis, HTN, anxiety, GERD, insomnia, depression, diabetes, chronic pain syndrome (sees pain management)  Additional history: Chart reviewed. Pertinent results include: Reviewed 2 prior ER visits from 7/14, and yesterday.  At that time patient had been complaining of palpitations and some chest pain, but  did not experience those today.  Also reviewed ER visit from 12/17 of last year in which patient was seen for possible TIA.  She followed up with neurology.  Physical Exam: Physical exam performed. The pertinent findings include: Mildly hypertensive, otherwise normal vital signs, no acute distress.  Endorses pain in her left arm and left leg, normal range of motion, normal sensation.  Compartments of the extremities are soft.  Normal neurologic exam as above.  Lab Tests/Imaging studies: I personally interpreted labs/imaging and the pertinent results include: CBC and BMP unremarkable.  CT angio and venogram pending. However, patient felt improved after medications and does not want to stay for imaging. I feel this is reasonable with symptom resolution.   Medications: I ordered medication including Toradol and droperidol.  I have reviewed the patients home medicines and have made adjustments as needed.   Disposition: After consideration of the diagnostic results and the patients response to treatment, I feel that emergency department workup does not suggest an emergent condition requiring admission or immediate intervention  beyond what has been performed at this time. The plan is: discharge to home with symptomatic management of ongoing dizziness and headache. Recommend following up with neurology and given contact information. No emergent etiology found for symptoms today, very reassuring that patient's symptoms have resolved after medications given. The patient is safe for discharge and has been instructed to return immediately for worsening symptoms, change in symptoms or any other concerns.  Final Clinical Impression(s) / ED Diagnoses Final diagnoses:  Dizziness  Acute nonintractable headache, unspecified headache type    Rx / DC Orders ED Discharge Orders     None      Portions of this report may have been transcribed using voice recognition software. Every effort was made to ensure accuracy; however, inadvertent computerized transcription errors may be present.    Jeanella Flattery 12/28/22 1518    Melene Plan, DO 12/28/22 1519

## 2022-12-31 ENCOUNTER — Emergency Department (HOSPITAL_BASED_OUTPATIENT_CLINIC_OR_DEPARTMENT_OTHER)
Admission: EM | Admit: 2022-12-31 | Discharge: 2022-12-31 | Disposition: A | Discharge status: Home / Self Care | Payer: Federal, State, Local not specified - PPO | Attending: Emergency Medicine | Admitting: Emergency Medicine

## 2022-12-31 ENCOUNTER — Other Ambulatory Visit (HOSPITAL_BASED_OUTPATIENT_CLINIC_OR_DEPARTMENT_OTHER): Payer: Self-pay

## 2022-12-31 ENCOUNTER — Other Ambulatory Visit: Payer: Self-pay

## 2022-12-31 ENCOUNTER — Encounter (HOSPITAL_BASED_OUTPATIENT_CLINIC_OR_DEPARTMENT_OTHER): Payer: Self-pay | Admitting: Emergency Medicine

## 2022-12-31 ENCOUNTER — Emergency Department (HOSPITAL_BASED_OUTPATIENT_CLINIC_OR_DEPARTMENT_OTHER): Payer: Federal, State, Local not specified - PPO | Admitting: Radiology

## 2022-12-31 DIAGNOSIS — R0789 Other chest pain: Secondary | ICD-10-CM | POA: Diagnosis not present

## 2022-12-31 DIAGNOSIS — Z7982 Long term (current) use of aspirin: Secondary | ICD-10-CM | POA: Insufficient documentation

## 2022-12-31 DIAGNOSIS — R079 Chest pain, unspecified: Secondary | ICD-10-CM

## 2022-12-31 DIAGNOSIS — R110 Nausea: Secondary | ICD-10-CM | POA: Insufficient documentation

## 2022-12-31 LAB — CBC
HCT: 43.3 % (ref 36.0–46.0)
Hemoglobin: 14.4 g/dL (ref 12.0–15.0)
MCH: 28.3 pg (ref 26.0–34.0)
MCHC: 33.3 g/dL (ref 30.0–36.0)
MCV: 85.1 fL (ref 80.0–100.0)
Platelets: 294 10*3/uL (ref 150–400)
RBC: 5.09 MIL/uL (ref 3.87–5.11)
RDW: 12.2 % (ref 11.5–15.5)
WBC: 7.1 10*3/uL (ref 4.0–10.5)
nRBC: 0 % (ref 0.0–0.2)

## 2022-12-31 LAB — BASIC METABOLIC PANEL
Anion gap: 10 (ref 5–15)
BUN: 10 mg/dL (ref 6–20)
CO2: 27 mmol/L (ref 22–32)
Calcium: 10.4 mg/dL — ABNORMAL HIGH (ref 8.9–10.3)
Chloride: 102 mmol/L (ref 98–111)
Creatinine, Ser: 0.8 mg/dL (ref 0.44–1.00)
GFR, Estimated: >60 mL/min (ref >60)
Glucose, Bld: 93 mg/dL (ref 70–99)
Potassium: 3.5 mmol/L (ref 3.5–5.1)
Sodium: 139 mmol/L (ref 135–145)

## 2022-12-31 LAB — TROPONIN I (HIGH SENSITIVITY)
Troponin I (High Sensitivity): <2 ng/L (ref <18)
Troponin I (High Sensitivity): <2 ng/L (ref <18)

## 2022-12-31 MED ORDER — LIDOCAINE VISCOUS HCL 2 % MT SOLN
15.0000 mL | Freq: Once | OROMUCOSAL | Status: AC
  Administered 2022-12-31: 15 mL via ORAL
  Filled 2022-12-31: qty 15

## 2022-12-31 MED ORDER — OMEPRAZOLE 40 MG PO CPDR
40.0000 mg | DELAYED_RELEASE_CAPSULE | Freq: Every day | ORAL | 0 refills | Status: DC
  Filled 2022-12-31: qty 30, 30d supply, fill #0

## 2022-12-31 MED ORDER — FAMOTIDINE IN NACL 20-0.9 MG/50ML-% IV SOLN
20.0000 mg | Freq: Once | INTRAVENOUS | Status: AC
  Administered 2022-12-31: 20 mg via INTRAVENOUS
  Filled 2022-12-31: qty 50

## 2022-12-31 MED ORDER — ALUM & MAG HYDROXIDE-SIMETH 200-200-20 MG/5ML PO SUSP
30.0000 mL | Freq: Once | ORAL | Status: AC
  Administered 2022-12-31: 30 mL via ORAL
  Filled 2022-12-31: qty 30

## 2022-12-31 NOTE — Discharge Instructions (Signed)
You were seen in the emergency department for chest pain and palpitations.  You had blood work EKG and chest x-ray that did not show an obvious cause of your symptoms.  We are starting you on some acid medication to see if that may help your symptoms.  We have also put a referral in for you to follow-up with cardiology.  Follow-up with your regular doctor.  Return to the emergency department if any worsening or concerning symptoms

## 2022-12-31 NOTE — ED Provider Notes (Signed)
EMERGENCY DEPARTMENT AT [facility] Provider Note   CSN: [id number] Arrival date & time: 12/31/22  4098     History  Chief Complaint  Patient presents with   Chest Pain    Stacey Stanley is a 47 y.o. female.  She is here with a complaint of pain in her chest that is been going on for 3 weeks.  It happens daily usually with exertion.  Associated with palpitations.  She said she cannot sleep at night because her heart just keeps beating so hard.  Keeping her up and has not had any sleep for 24 hours.  She has tried multiple NSAIDs and simethicone without improvement.  She is also having dizziness when she gets the chest pain and palpitations.  She has been seen in the ED few times recently for same.  She is to have a cardiologist but does not have one anymore.  Has been trying to get in with her PCP but has not seen them yet.  Former smoker, denies any cocaine.  No history of coronary disease.  Status post cholecystectomy.  No shortness of breath cough vomiting diarrhea.  Minimal nausea.  The history is provided by the patient.  Chest Pain Pain location:  Substernal area Pain quality: aching   Pain severity:  Severe Onset quality:  Gradual Duration:  3 weeks Timing:  Intermittent Progression:  Worsening Chronicity:  New Relieved by:  Nothing Worsened by:  Exertion Ineffective treatments:  Antacids and aspirin Associated symptoms: dizziness, nausea and near-syncope   Associated symptoms: no abdominal pain, no claudication, no cough, no diaphoresis, no fever, no shortness of breath and no vomiting        Home Medications Prior to Admission medications   Medication Sig Start Date End Date Taking? Authorizing Provider  acetaminophen (TYLENOL) 500 MG tablet Take 500 mg by mouth every 6 (six) hours as needed for mild pain.    [provider]  aspirin 81 MG chewable tablet Chew 1 tablet (81 mg total) by mouth daily. 12/12/21   Karie Mainland, Amjad, PA-C   clindamycin (CLEOCIN T) 1 % lotion Apply topically every morning. 08/03/22   [provider]  Dapsone 5 % topical gel Apply 60 g topically daily. 09/21/19   [provider]  ibuprofen (ADVIL) 200 MG tablet Take 200 mg by mouth every 6 (six) hours as needed for mild pain.    [provider]  imiquimod (ALDARA) 5 % cream Apply 1 packet topically daily as needed (warts). 09/21/19   [provider]  mupirocin ointment (BACTROBAN) 2 % Apply topically. 08/03/22   [provider]  naproxen (NAPROSYN) 500 MG tablet Take 1 tablet (500 mg total) by mouth 2 (two) times daily. 12/27/22   Geoffery Lyons, MD  Omega-3 Fatty Acids (FISH OIL) 1000 MG CAPS Take 2,000 mg by mouth daily.    [provider]  Semaglutide-Weight Management (WEGOVY) 0.25 MG/0.5ML SOAJ Inject 0.25 mg into the skin once a week. 11/18/22   Alba Cory, MD  tapentadol HCl (NUCYNTA) 75 MG tablet Take 1 tablet (75 mg total) by mouth 2 (two) times daily as needed for moderate pain. 12/18/22 01/17/23  Edward Jolly, MD  TRI-LUMA 0.01-4-0.05 % CREA Apply 30 mg topically daily. 09/21/19   [provider]  TRULANCE 3 MG TABS Take 1 tablet by mouth daily as needed (constipation). 09/29/21   [provider]  valsartan-hydrochlorothiazide (DIOVAN-HCT) 160-12.5 MG tablet Take 1 tablet by mouth daily. 11/18/22   Sowles, Danna Hefty,  MD      Allergies    Nuvigil [armodafinil] and Ppd [tuberculin purified protein derivative]    Review of Systems   Review of Systems  Constitutional:  Negative for diaphoresis and fever.  Respiratory:  Negative for cough and shortness of breath.   Cardiovascular:  Positive for chest pain and near-syncope. Negative for claudication.  Gastrointestinal:  Positive for nausea. Negative for abdominal pain and vomiting.  Neurological:  Positive for dizziness.    Physical Exam Updated Vital Signs BP 139/88 (BP Location: Left Arm)   Pulse 96   Temp 98.4 F (36.9  C) (Oral)   Resp 13   Wt 86.2 kg   LMP 07/09/2015 (Exact Date)   SpO2 99%   BMI 31.62 kg/m  Physical Exam Vitals and nursing note reviewed.  Constitutional:      General: She is not in acute distress.    Appearance: Normal appearance. She is well-developed.  HENT:     Head: Normocephalic and atraumatic.  Eyes:     Conjunctiva/sclera: Conjunctivae normal.  Cardiovascular:     Rate and Rhythm: Normal rate and regular rhythm.     Heart sounds: Normal heart sounds. No murmur heard. Pulmonary:     Effort: Pulmonary effort is normal. No respiratory distress.     Breath sounds: Normal breath sounds.  Abdominal:     Palpations: Abdomen is soft.     Tenderness: There is no abdominal tenderness.  Musculoskeletal:        General: No swelling. Normal range of motion.     Cervical back: Neck supple.     Right lower leg: No tenderness. No edema.     Left lower leg: No tenderness. No edema.  Skin:    General: Skin is warm and dry.     Capillary Refill: Capillary refill takes less than 2 seconds.  Neurological:     General: No focal deficit present.     Mental Status: She is alert.     Sensory: No sensory deficit.     Motor: No weakness.     ED Results / Procedures / Treatments   Labs (all labs ordered are listed, but only abnormal results are displayed) Labs Reviewed  BASIC METABOLIC PANEL - Abnormal; Notable for the following components:      Result Value   Calcium 10.4 (*)    All other components within normal limits  CBC  TROPONIN I (HIGH SENSITIVITY)  TROPONIN I (HIGH SENSITIVITY)    EKG EKG Interpretation Date/Time:  Thursday December 31 2022 06:25:27 EDT Ventricular Rate:  95 PR Interval:  145 QRS Duration:  100 QT Interval:  340 QTC Calculation: 428 R Axis:   50  Text Interpretation: Sinus rhythm Normal ECG No significant change was found Confirmed by Paula Libra ([id number]) on 12/31/2022 6:26:43 AM  Radiology DG Chest 2 View  Result Date: 12/31/2022 CLINICAL  DATA:  Chest pain. EXAM: CHEST - 2 VIEW COMPARISON:  Chest x-ray 12/27/2022. FINDINGS: The heart size and mediastinal contours are within normal limits. Both lungs are clear. No visible pleural effusions or pneumothorax. No acute osseous abnormality. IMPRESSION: No active cardiopulmonary disease. Electronically Signed   By: Feliberto Harts M.D.   On: 12/31/2022 07:13    Procedures Procedures    Medications Ordered in ED Medications  alum & mag hydroxide-simeth (MAALOX/MYLANTA) 200-200-20 MG/5ML suspension 30 mL (30 mLs Oral Given 12/31/22 0736)    And  lidocaine (XYLOCAINE) 2 % viscous mouth solution 15 mL (15 mLs Oral Given 12/31/22  1308)  famotidine (PEPCID) IVPB 20 mg premix (0 mg Intravenous Stopped 12/31/22 1126)    ED Course/ Medical Decision Making/ A&P Clinical Course as of 12/31/22 1738  Thu Dec 31, 2022  6578 Chest x-ray interpreted by me as no acute infiltrate.  Awaiting radiology reading. [MB]  [phone] Patient continues to have waves of pain.  Awaiting second troponin.  No response to GI cocktail.  She had a negative D-dimer 2 weeks ago with her workup. [MB]  1055 Patient states she feels a little bit better after the Pepcid.  Will put her on PPI and have her follow-up with PCP. [MB]    Clinical Course User Index [MB] Terrilee Files, MD                                 Medical Decision Making Amount and/or Complexity of Data Reviewed Labs: ordered. Radiology: ordered.  Risk OTC drugs. Prescription drug management.   This patient complains of chest pain and palpitations; this involves an extensive number of treatment Options and is a complaint that carries with it a high risk of complications and morbidity. The differential includes ACS, arrhythmia, musculoskeletal, PE, pneumothorax, pneumonia, vascular, reflux  I ordered, reviewed and interpreted labs, which included CBC normal chemistries normal troponins flat I ordered medication GI cocktail Pepcid and reviewed PMP  when indicated. I ordered imaging studies which included chest x-ray and I independently    visualized and interpreted imaging which showed no acute findings Previous records obtained and reviewed in epic, multiple ED visits for similar presentation  Cardiac monitoring reviewed, normal sinus rhythm Social determinants considered, no significant barriers Critical Interventions: None  After the interventions stated above, I reevaluated the patient and found patient to be hemodynamically stable and well-appearing Admission and further testing considered, no indications for admission or further workup at this time.  Will start patient on PPI and recommended follow-up with PCP.  Also given referral for cardiology.  Return instructions discussed         Final Clinical Impression(s) / ED Diagnoses Final diagnoses:  Nonspecific chest pain    Rx / DC Orders ED Discharge Orders     None         Terrilee Files, MD 12/31/22 1740

## 2022-12-31 NOTE — ED Triage Notes (Signed)
Pt in with L chest pressure x 3 wks, but pt states pain is now worsened since last night. Reporting L arm pain, dizziness and nausea, denies any sob. Recently evaluated and sent home with Naproxyn

## 2023-01-04 ENCOUNTER — Encounter: Payer: Self-pay | Admitting: Family Medicine

## 2023-01-04 ENCOUNTER — Ambulatory Visit: Payer: Federal, State, Local not specified - PPO | Attending: Family Medicine

## 2023-01-04 ENCOUNTER — Ambulatory Visit: Payer: Self-pay

## 2023-01-04 ENCOUNTER — Ambulatory Visit: Payer: Federal, State, Local not specified - PPO | Admitting: Family Medicine

## 2023-01-04 VITALS — BP 104/68 | HR 91 | Temp 97.9°F | Resp 16 | Ht 65.0 in | Wt 183.6 lb

## 2023-01-04 DIAGNOSIS — R002 Palpitations: Secondary | ICD-10-CM | POA: Diagnosis not present

## 2023-01-04 DIAGNOSIS — M79605 Pain in left leg: Secondary | ICD-10-CM

## 2023-01-04 DIAGNOSIS — I10 Essential (primary) hypertension: Secondary | ICD-10-CM

## 2023-01-04 DIAGNOSIS — R079 Chest pain, unspecified: Secondary | ICD-10-CM

## 2023-01-04 DIAGNOSIS — Z09 Encounter for follow-up examination after completed treatment for conditions other than malignant neoplasm: Secondary | ICD-10-CM

## 2023-01-04 DIAGNOSIS — I471 Supraventricular tachycardia, unspecified: Secondary | ICD-10-CM

## 2023-01-04 DIAGNOSIS — F419 Anxiety disorder, unspecified: Secondary | ICD-10-CM

## 2023-01-04 MED ORDER — HYDROXYZINE PAMOATE 25 MG PO CAPS
25.0000 mg | ORAL_CAPSULE | Freq: Three times a day (TID) | ORAL | 0 refills | Status: DC | PRN
Start: 2023-01-04 — End: 2023-02-03

## 2023-01-04 MED ORDER — VALSARTAN-HYDROCHLOROTHIAZIDE 160-12.5 MG PO TABS
0.5000 | ORAL_TABLET | Freq: Every day | ORAL | Status: DC
Start: 2023-01-04 — End: 2023-02-03

## 2023-01-04 MED ORDER — METOPROLOL SUCCINATE ER 25 MG PO TB24
25.0000 mg | ORAL_TABLET | Freq: Every day | ORAL | 1 refills | Status: DC
Start: 2023-01-04 — End: 2023-02-03
  Filled 2023-01-26 – 2023-01-29 (×2): qty 30, 30d supply, fill #0

## 2023-01-04 MED ORDER — BUSPIRONE HCL 5 MG PO TABS
5.0000 mg | ORAL_TABLET | Freq: Two times a day (BID) | ORAL | 1 refills | Status: DC | PRN
Start: 2023-01-04 — End: 2023-02-03
  Filled 2023-01-29: qty 60, 10d supply, fill #0

## 2023-01-04 NOTE — Progress Notes (Unsigned)
Patient ID: Stacey Stanley, female    DOB: 12/26/1975, 47 y.o.   MRN: [id number]  PCP: Alba Cory, MD  Chief Complaint  Patient presents with   Dizziness   Palpitations    On going for a few months, mainly happens while in grocery store, has seen ER several times nothing found. Pt believe could be related to POTS    Subjective:   Stacey Stanley is a 47 y.o. female, presents to clinic with CC of the following:  HPI  She is having episodes if palpitations and dizziness usually when she is standing or walking, does not happen after immediately getting up, often happens when she is at the grocery store Multiple ER visits  Happening every day While in exam room she started to have CP described as pressure to her left chest, and leg pain, she is wearing a stocking, started to wear it a week ago, pain in leg comes and goes with chest discomfort   Recently stopped smoking - maybe 2 weeks ago Continued taking bp med, usually bp is higher   Pulse Readings from Last 3 Encounters:  01/04/23 91  12/31/22 90  12/28/22 (!) 102   BP Readings from Last 3 Encounters:  01/04/23 104/68  12/31/22 (!) 119/96  12/28/22 (!) 135/91      Patient Active Problem List   Diagnosis Date Noted   History of TIA (transient ischemic attack) 02/05/2022   Hypercalcemia 11/13/2021   Vitamin D deficiency 11/13/2021   Squamous cell carcinoma in situ of skin 08/10/2021   Major depression, recurrent, chronic (HCC) 08/08/2021   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 08/08/2021   Chronic diarrhea of unknown origin    Gastric erosion    Polyp of rectum    Chronic pain syndrome 12/28/2017   Opiate use 12/28/2017   Chronic idiopathic constipation 10/13/2017   Mass of left ovary 06/28/2017   Kidney stone on left side 06/28/2017   Cervical radiculitis 03/08/2017   Vitamin B12 deficiency 08/19/2016   Prediabetes 08/19/2016   Right thyroid nodule 07/31/2015   Chronic pelvic pain in female  04/05/2015   GERD (gastroesophageal reflux disease) 04/05/2015   Insomnia, persistent 11/25/2014   Endometriosis 11/25/2014   Hypertension, benign 11/25/2014      Current Outpatient Medications:    acetaminophen (TYLENOL) 500 MG tablet, Take 500 mg by mouth every 6 (six) hours as needed for mild pain., Disp: , Rfl:    aspirin 81 MG chewable tablet, Chew 1 tablet (81 mg total) by mouth daily., Disp: 90 tablet, Rfl: 0   clindamycin (CLEOCIN T) 1 % lotion, Apply topically every morning., Disp: , Rfl:    Dapsone 5 % topical gel, Apply 60 g topically daily., Disp: , Rfl:    ibuprofen (ADVIL) 200 MG tablet, Take 200 mg by mouth every 6 (six) hours as needed for mild pain., Disp: , Rfl:    imiquimod (ALDARA) 5 % cream, Apply 1 packet topically daily as needed (warts)., Disp: , Rfl:    meloxicam (MOBIC) 15 MG tablet, Take 15 mg by mouth daily as needed., Disp: , Rfl:    methylPREDNISolone (MEDROL DOSEPAK) 4 MG TBPK tablet, Take 4 mg by mouth taper from 4 doses each day to 1 dose and stop., Disp: , Rfl:    mupirocin ointment (BACTROBAN) 2 %, Apply topically., Disp: , Rfl:    naproxen (NAPROSYN) 500 MG tablet, Take 1 tablet (500 mg total) by mouth 2 (two) times daily., Disp: 20  tablet, Rfl: 0   Omega-3 Fatty Acids (FISH OIL) 1000 MG CAPS, Take 2,000 mg by mouth daily., Disp: , Rfl:    omeprazole (PRILOSEC) 40 MG capsule, Take 1 capsule (40 mg total) by mouth daily., Disp: 30 capsule, Rfl: 0   Semaglutide-Weight Management (WEGOVY) 0.25 MG/0.5ML SOAJ, Inject 0.25 mg into the skin once a week., Disp: 2 mL, Rfl: 0   tapentadol HCl (NUCYNTA) 75 MG tablet, Take 1 tablet (75 mg total) by mouth 2 (two) times daily as needed for moderate pain., Disp: 60 tablet, Rfl: 0   TRI-LUMA 0.01-4-0.05 % CREA, Apply 30 mg topically daily., Disp: , Rfl:    TRULANCE 3 MG TABS, Take 1 tablet by mouth daily as needed (constipation)., Disp: , Rfl:    valsartan-hydrochlorothiazide (DIOVAN-HCT) 160-12.5 MG tablet, Take 1  tablet by mouth daily., Disp: 90 tablet, Rfl: 0   Allergies  Allergen Reactions   Nuvigil [Armodafinil]     Chest pain    Ppd [Tuberculin Purified Protein Derivative]      Social History   Tobacco Use   Smoking status: Former    Current packs/day: 0.50    Average packs/day: 0.5 packs/day for 29.1 years (14.6 ttl pk-yrs)    Types: Cigarettes    Start date: 11/25/1993   Smokeless tobacco: Never  Vaping Use   Vaping status: Former   Start date: 06/01/2014   Quit date: 06/02/2015  Substance Use Topics   Alcohol use: No    Alcohol/week: 0.0 standard drinks of alcohol   Drug use: No      Chart Review Today: I personally reviewed active problem list, medication list, allergies, family history, social history, health maintenance, notes from last encounter, lab results, imaging with the patient/caregiver today.   Review of Systems  Constitutional: Negative.   HENT: Negative.    Eyes: Negative.   Respiratory: Negative.    Cardiovascular: Negative.   Gastrointestinal: Negative.   Endocrine: Negative.   Genitourinary: Negative.   Musculoskeletal: Negative.   Skin: Negative.   Allergic/Immunologic: Negative.   Neurological: Negative.   Hematological: Negative.   Psychiatric/Behavioral: Negative.    All other systems reviewed and are negative.      Objective:   Vitals:   01/04/23 0910  BP: 104/68  Pulse: 91  Resp: 16  Temp: 97.9 F (36.6 C)  TempSrc: Oral  SpO2: 98%  Weight: 183 lb 9.6 oz (83.3 kg)  Height: 5\' 5"  (1.651 m)    Body mass index is 30.55 kg/m.  Physical Exam Vitals and nursing note reviewed.  Constitutional:      General: She is not in acute distress.    Appearance: Normal appearance. She is well-developed. She is not ill-appearing, toxic-appearing or diaphoretic.  HENT:     Head: Normocephalic and atraumatic.     Right Ear: External ear normal.     Left Ear: External ear normal.  Eyes:     General: Lids are normal. No scleral icterus.        Right eye: No discharge.        Left eye: No discharge.     Conjunctiva/sclera: Conjunctivae normal.  Neck:     Trachea: Phonation normal. No tracheal deviation.  Cardiovascular:     Rate and Rhythm: Normal rate and regular rhythm.     Pulses: Normal pulses.          Radial pulses are 2+ on the right side and 2+ on the left side.       Posterior tibial  pulses are 2+ on the right side and 2+ on the left side.     Heart sounds: Normal heart sounds. No murmur heard.    No friction rub. No gallop.  Pulmonary:     Effort: Pulmonary effort is normal. No respiratory distress.     Breath sounds: Normal breath sounds. No stridor. No wheezing, rhonchi or rales.  Chest:     Chest wall: No tenderness.  Abdominal:     General: Bowel sounds are normal. There is no distension.     Palpations: Abdomen is soft.  Musculoskeletal:     Right lower leg: No edema.     Left lower leg: No edema.  Skin:    General: Skin is warm and dry.     Coloration: Skin is not jaundiced or pale.     Findings: No rash.  Neurological:     Mental Status: She is alert.     Motor: No abnormal muscle tone.     Gait: Gait normal.  Psychiatric:        Mood and Affect: Mood normal.        Speech: Speech normal.        Behavior: Behavior normal. Behavior is cooperative.      Results for orders placed or performed during the hospital encounter of 12/31/22  Basic metabolic panel  Result Value Ref Range   Sodium 139 135 - 145 mmol/L   Potassium 3.5 3.5 - 5.1 mmol/L   Chloride 102 98 - 111 mmol/L   CO2 27 22 - 32 mmol/L   Glucose, Bld 93 70 - 99 mg/dL   BUN 10 6 - 20 mg/dL   Creatinine, Ser 1.61 0.44 - 1.00 mg/dL   Calcium 09.6 (H) 8.9 - 10.3 mg/dL   GFR, Estimated >04 >54 mL/min   Anion gap 10 5 - 15  CBC  Result Value Ref Range   WBC 7.1 4.0 - 10.5 K/uL   RBC 5.09 3.87 - 5.11 MIL/uL   Hemoglobin 14.4 12.0 - 15.0 g/dL   HCT 09.8 11.9 - 14.7 %   MCV 85.1 80.0 - 100.0 fL   MCH 28.3 26.0 - 34.0 pg   MCHC 33.3  30.0 - 36.0 g/dL   RDW 82.9 56.2 - 13.0 %   Platelets 294 150 - 400 K/uL   nRBC 0.0 0.0 - 0.2 %  Troponin I (High Sensitivity)  Result Value Ref Range   Troponin I (High Sensitivity) <2 <18 ng/L  Troponin I (High Sensitivity)  Result Value Ref Range   Troponin I (High Sensitivity) <2 <18 ng/L       Assessment & Plan:   1. Palpitations Several ECGs done in ED setting, ECG today NSR, pulses and HR RRR on exam, hx of SVT, refer to cardiology and get holter, start lower dose BB (she was on before 10-15 years ago) - metoprolol succinate (TOPROL-XL) 25 MG 24 hr tablet; Take 1 tablet (25 mg total) by mouth daily.  Dispense: 30 tablet; Refill: 1 - LONG TERM MONITOR (3-14 DAYS) - EKG 12-Lead  2. Chest pain, unspecified type She developed CP while in exam room, grabbing chest, ECG obtained, no ST elevation or depression, pt can use ASA and BB while waiting for cardiology consult, when pt has normal HR doubt CP be cardiac -there may be some aspect of anxiety/stress involved - metoprolol succinate (TOPROL-XL) 25 MG 24 hr tablet; Take 1 tablet (25 mg total) by mouth daily.  Dispense: 30 tablet; Refill: 1 - LONG  TERM MONITOR (3-14 DAYS) - EKG 12-Lead  3. Hypertension, benign Episodes of palpitations/dizziness have occurred more often with lower bp - pt will try 1/2 dose of her meds for a few weeks to see if there are less episodes BP Readings from Last 6 Encounters:  01/04/23 104/68  12/31/22 (!) 119/96  12/28/22 (!) 135/91  12/27/22 115/81  12/13/22 119/87  10/06/22 106/68  - valsartan-hydrochlorothiazide (DIOVAN-HCT) 160-12.5 MG tablet; Take 0.5 tablets by mouth daily.  4. SVT (supraventricular tachycardia) Hx of same years ago on BB RRR today, she will restart metoprolol - LONG TERM MONITOR (3-14 DAYS) - EKG 12-Lead  5. Left leg pain Deep achy leg pain associated with her CP, she has been wearing a stocking on it since onset. D-dimer neg since onset, unlikely to be DVT - unclear  etiology No weakness/numbness, edema, no color change Watch for neuro deficits, color change - would recommend ED eval if concerning sx start  6. Anxiety Possibly some anxiety contributing to sx Pt has sx in setting of some acute stressors - taking NP boards, looking for a job, willing to try prn meds  Explained possible benefit/SE and dosing - busPIRone (BUSPAR) 5 MG tablet; Take 1-3 tablets (5-15 mg total) by mouth 2 (two) times daily as needed.  Dispense: 60 tablet; Refill: 1 - hydrOXYzine (VISTARIL) 25 MG capsule; Take 1 capsule (25 mg total) by mouth 3 (three) times daily as needed for anxiety (or at bedtime for anxiety).  Dispense: 30 capsule; Refill: 0  7. Encounter for examination following treatment at hospital All recent ED visits, labs, imaging, and ECGs reviewed  ECG done today reviewed no ST elevation or depression, NSR HR 77   Danelle Berry, PA-C 01/04/23 9:25 AM

## 2023-01-04 NOTE — Telephone Encounter (Signed)
  Chief Complaint: Dizziness, SOB, palpitations Symptoms: above Frequency: weeks Pertinent Negatives: Patient denies  Disposition: [] ED /[] Urgent Care (no appt availability in office) / [x] Appointment(In office/virtual)/ []  [facility]/ [] Home Care/ [] Refused Recommended Disposition /[] [facility]/ []  Follow-up with PCP Additional Notes: Pt has had s/s for several weeks and has gone to ED several times without resolution. Pt is requesting OV ASAP.    Reason for Disposition  Requesting regular office appointment  Answer Assessment - Initial Assessment Questions 1. REASON FOR CALL or QUESTION: "What is your reason for calling today?" or "How can I best help you?" or "What question do you have that I can help answer?"     Pt has been seen in ED several times for the same s/s without resolution. Pt requesting OV asap.  Protocols used: Information Only Call - No Triage-A-AH

## 2023-01-07 ENCOUNTER — Encounter
Payer: Federal, State, Local not specified - PPO | Admitting: Student in an Organized Health Care Education/Training Program

## 2023-01-07 DIAGNOSIS — R079 Chest pain, unspecified: Secondary | ICD-10-CM | POA: Diagnosis not present

## 2023-01-07 DIAGNOSIS — R002 Palpitations: Secondary | ICD-10-CM

## 2023-01-07 DIAGNOSIS — I471 Supraventricular tachycardia, unspecified: Secondary | ICD-10-CM

## 2023-01-08 ENCOUNTER — Ambulatory Visit: Payer: Federal, State, Local not specified - PPO | Admitting: Family Medicine

## 2023-01-11 ENCOUNTER — Encounter: Payer: Self-pay | Admitting: Student in an Organized Health Care Education/Training Program

## 2023-01-11 ENCOUNTER — Other Ambulatory Visit (HOSPITAL_BASED_OUTPATIENT_CLINIC_OR_DEPARTMENT_OTHER): Payer: Self-pay

## 2023-01-11 ENCOUNTER — Ambulatory Visit
Payer: Federal, State, Local not specified - PPO | Attending: Student in an Organized Health Care Education/Training Program | Admitting: Student in an Organized Health Care Education/Training Program

## 2023-01-11 VITALS — BP 119/90 | HR 81 | Temp 97.3°F | Resp 16 | Ht 65.0 in | Wt 185.0 lb

## 2023-01-11 DIAGNOSIS — F320 Major depressive disorder, single episode, mild: Secondary | ICD-10-CM | POA: Diagnosis not present

## 2023-01-11 DIAGNOSIS — G894 Chronic pain syndrome: Secondary | ICD-10-CM | POA: Insufficient documentation

## 2023-01-11 DIAGNOSIS — N809 Endometriosis, unspecified: Secondary | ICD-10-CM | POA: Insufficient documentation

## 2023-01-11 DIAGNOSIS — G8929 Other chronic pain: Secondary | ICD-10-CM | POA: Diagnosis not present

## 2023-01-11 DIAGNOSIS — G4700 Insomnia, unspecified: Secondary | ICD-10-CM | POA: Insufficient documentation

## 2023-01-11 DIAGNOSIS — R102 Pelvic and perineal pain: Secondary | ICD-10-CM | POA: Diagnosis not present

## 2023-01-11 MED ORDER — TAPENTADOL HCL 75 MG PO TABS
75.0000 mg | ORAL_TABLET | Freq: Two times a day (BID) | ORAL | 0 refills | Status: AC | PRN
Start: 2023-01-12 — End: 2023-02-11

## 2023-01-11 MED ORDER — TAPENTADOL HCL 75 MG PO TABS
75.0000 mg | ORAL_TABLET | Freq: Two times a day (BID) | ORAL | 0 refills | Status: DC | PRN
Start: 2023-02-11 — End: 2023-02-10

## 2023-01-11 MED ORDER — TAPENTADOL HCL 75 MG PO TABS
75.0000 mg | ORAL_TABLET | Freq: Two times a day (BID) | ORAL | 0 refills | Status: DC | PRN
Start: 2023-03-13 — End: 2023-02-10

## 2023-01-11 NOTE — Progress Notes (Signed)
Nursing Pain Medication Assessment:  Safety precautions to be maintained throughout the outpatient stay will include: orient to surroundings, keep bed in low position, maintain call bell within reach at all times, provide assistance with transfer out of bed and ambulation.  Medication Inspection Compliance: Pill count conducted under aseptic conditions, in front of the patient. Neither the pills nor the bottle was removed from the patient's sight at any time. Once count was completed pills were immediately returned to the patient in their original bottle.  Medication:  nucynta Pill/Patch Count:  4 of 60 pills remain Pill/Patch Appearance: Markings consistent with prescribed medication Bottle Appearance: Standard pharmacy container. Clearly labeled. Filled Date: 07 / 17 / 2024 Last Medication intake:  Today

## 2023-01-11 NOTE — Progress Notes (Signed)
PROVIDER NOTE: Information contained herein reflects review and annotations entered in association with encounter. Interpretation of such information and data should be left to medically-trained personnel. Information provided to patient can be located elsewhere in the medical record under "Patient Instructions". Document created using STT-dictation technology, any transcriptional errors that may result from process are unintentional.    Patient: Stacey Stanley  Service Category: E/M  Provider: Edward Jolly, MD  DOB: 1976-03-07  DOS: 01/11/2023  Referring Provider: Alba Cory, MD  MRN: [id number]  Specialty: Interventional Pain Management  PCP: Alba Cory, MD  Type: Established Patient  Setting: Ambulatory outpatient    Location: Office  Delivery: Face-to-face     HPI  [name], a 47 y.o. year old female, is here today because of her Chronic pain syndrome [G89.4]. Stacey Stanley primary complain today is Pelvic Pain Last encounter: My last encounter with her was on 10/06/22 Pertinent problems: Stacey Stanley has Endometriosis; Chronic pelvic pain in female; Cervical radiculitis; Chronic pain syndrome; and Opiate use on their pertinent problem list. Pain Assessment: Severity of Chronic pain is reported as a 7 /10. Location: Pelvis  /radiates to  eft. Onset: More than a month ago. Quality: Sharp. Timing: Constant. Modifying factor(s): medication. Vitals:  height is 5\' 5"  (1.651 m) and weight is 185 lb (83.9 kg). Her temperature is 97.3 F (36.3 C) (abnormal). Her blood pressure is 119/90 (abnormal) and her pulse is 81. Her respiration is 16 and oxygen saturation is 99%.   Reason for encounter: medication management.   No change in medical history since last visit.  Patient's pain is at baseline.  Patient continues multimodal pain regimen as prescribed.  States that it provides pain relief and improvement in functional status. Recovering from her trigger finger surgery which she  states was more complicated than expected, especially the post operative course She is also endorsing increased pelvic pain  Pharmacotherapy Assessment  Analgesic: Nucynta 75 mg twice daily as needed, quantity 45/month; MME equals 45   Monitoring: [place] PMP: PDMP reviewed during this encounter.       Pharmacotherapy: No side-effects or adverse reactions reported. Compliance: No problems identified. Effectiveness: Clinically acceptable.  Stacey Salts, RN  01/11/2023  1:13 PM  Sign when Signing Visit Nursing Pain Medication Assessment:  Safety precautions to be maintained throughout the outpatient stay will include: orient to surroundings, keep bed in low position, maintain call bell within reach at all times, provide assistance with transfer out of bed and ambulation.  Medication Inspection Compliance: Pill count conducted under aseptic conditions, in front of the patient. Neither the pills nor the bottle was removed from the patient's sight at any time. Once count was completed pills were immediately returned to the patient in their original bottle.  Medication:  nucynta Pill/Patch Count:  4 of 60 pills remain Pill/Patch Appearance: Markings consistent with prescribed medication Bottle Appearance: Standard pharmacy container. Clearly labeled. Filled Date: 07 / 17 / 2024 Last Medication intake:  Today    UDS:  Summary  Date Value Ref Range Status  01/08/2022 Note  Final    Comment:    ==================================================================== [employer]ox[employer] Select 13 (MW) ==================================================================== Test                             Result       Flag       Units  Drug Present and Declared for Prescription Verification   Tapentadol                     >  8621        EXPECTED   ng/mg creat    Source of tapentadol is a scheduled prescription medication.  ==================================================================== Test                       Result    Flag   Units      Ref Range   Creatinine              116              mg/dL      >=40 ==================================================================== Declared Medications:  The flagging and interpretation on this report are based on the  following declared medications.  Unexpected results may arise from  inaccuracies in the declared medications.   **Note: The testing scope of this panel includes these medications:   Tapentadol (Nucynta)   **Note: The testing scope of this panel does not include the  following reported medications:   Acetaminophen (Tylenol)  Aspirin  Clopidogrel (Plavix)  Dapsone  Fish Oil  Fluocinolone  Hydrochlorothiazide (Hydrodiuril)  Hydroxyzine (Atarax)  Ibuprofen (Advil)  Imiquimod (Aldara)  Ondansetron (Zofran)  Plecanatide (Trulance)  Prednisone  Semaglutide (Ozempic)  Tretinoin ==================================================================== For clinical consultation, please call [phone]. ====================================================================      ROS  Constitutional: Denies any fever or chills Gastrointestinal: No reported hemesis, hematochezia, vomiting, or acute GI distress Musculoskeletal: pelvic pain Neurological: No reported episodes of acute onset apraxia, aphasia, dysarthria, agnosia, amnesia, paralysis, loss of coordination, or loss of consciousness  Medication Review  Dapsone, Fish Oil, Fluocin-Hydroquinone-Tretinoin, Plecanatide, Semaglutide-Weight Management, acetaminophen, aspirin, busPIRone, clindamycin, hydrOXYzine, ibuprofen, imiquimod, meloxicam, methylPREDNISolone, metoprolol succinate, mupirocin ointment, naproxen, omeprazole, tapentadol HCl, and valsartan-hydrochlorothiazide  History Review  Allergy: Stacey Stanley is allergic to nuvigil [armodafinil] and ppd [tuberculin purified protein derivative]. Drug: Stacey Stanley  reports no history of drug use. Alcohol:  reports no history of  alcohol use. Tobacco:  reports that she has quit smoking. Her smoking use included cigarettes. She started smoking about 29 years ago. She has a 14.6 pack-year smoking history. She has never used smokeless tobacco. Social: Stacey Stanley  reports that she has quit smoking. Her smoking use included cigarettes. She started smoking about 29 years ago. She has a 14.6 pack-year smoking history. She has never used smokeless tobacco. She reports that she does not drink alcohol and does not use drugs. Medical:  has a past medical history of Anxiety, Arthritis, Chronic insomnia, Depression, Diabetes mellitus without complication (HCC), Dysrhythmia, Endometriosis, GERD (gastroesophageal reflux disease), Gout, Headache(784.0), History of kidney stones, Hypertension, Metabolic syndrome, Obesity, Right thyroid nodule (2017), Serum calcium elevated, Sleep disorder, circadian, shift work type, SVD (spontaneous vaginal delivery), Tachycardia, and TIA (transient ischemic attack). Surgical: Stacey Stanley  has a past surgical history that includes metatarsil  (2003); Dilation and curettage of uterus; Induced abortion; laparoscopy (1992); Eye surgery (06/28/2013); laparoscopy (N/A, 07/19/2013); Hysteroscopy with D & C (N/A, 07/19/2013); Abdominal hysterectomy (05/21/2016); Hemorroidectomy (04/04/2018); Colonoscopy (2019); Colonoscopy with propofol (N/A, 07/04/2021); Esophagogastroduodenoscopy (egd) with propofol (N/A, 07/04/2021); Rectal surgery (08/15/2021); and Hand surgery (Right). Family: family history includes Alzheimer's disease in her paternal grandfather; Diabetes in her mother; Heart disease in her brother; Hypertension in her father and mother; Kidney disease in her father; Leukemia in her paternal grandmother; Stroke in her maternal grandmother.  Laboratory Chemistry Profile   Renal Lab Results  Component Value Date   BUN 10 12/31/2022   CREATININE 0.80 12/31/2022   BCR SEE NOTE: 08/13/2022   GFRAA 108  11/28/2020    GFRNONAA >60 12/31/2022    Hepatic Lab Results  Component Value Date   AST 11 08/13/2022   ALT 9 08/13/2022   ALBUMIN 4.1 05/17/2022   ALKPHOS 52 05/17/2022   AMYLASE 102 (H) 05/22/2016   LIPASE 13 04/01/2022    Electrolytes Lab Results  Component Value Date   NA 139 12/31/2022   K 3.5 12/31/2022   CL 102 12/31/2022   CALCIUM 10.4 (H) 12/31/2022   MG 1.7 12/13/2022    Bone Lab Results  Component Value Date   VD25OH 37 08/13/2022    Inflammation (CRP: Acute Phase) (ESR: Chronic Phase) Lab Results  Component Value Date   CRP 0.2 05/06/2020   ESRSEDRATE 2 05/06/2020         Note: Above Lab results reviewed.  Recent Imaging Review  DG Chest 2 View CLINICAL DATA:  Chest pain.  EXAM: CHEST - 2 VIEW  COMPARISON:  Chest x-ray 12/27/2022.  FINDINGS: The heart size and mediastinal contours are within normal limits. Both lungs are clear. No visible pleural effusions or pneumothorax. No acute osseous abnormality.  IMPRESSION: No active cardiopulmonary disease.  Electronically Signed   By: Feliberto Harts M.D.   On: 12/31/2022 07:13 Note: Reviewed        Physical Exam  General appearance: Well nourished, well developed, and well hydrated. In no apparent acute distress Mental status: Alert, oriented x 3 (person, place, & time)       Respiratory: No evidence of acute respiratory distress Eyes: PERLA Vitals: BP (!) 119/90 Comment: takes blood pressure medication. Aware to f/u with PCP  Pulse 81   Temp (!) 97.3 F (36.3 C)   Resp 16   Ht 5\' 5"  (1.651 m)   Wt 185 lb (83.9 kg)   LMP 07/09/2015 (Exact Date)   SpO2 99%   BMI 30.79 kg/m  BMI: Estimated body mass index is 30.79 kg/m as calculated from the following:   Height as of this encounter: 5\' 5"  (1.651 m).   Weight as of this encounter: 185 lb (83.9 kg). Ideal: Ideal body weight: 57 kg (125 lb 10.6 oz) Adjusted ideal body weight: 67.8 kg (149 lb 6.4 oz)   Assessment   Diagnosis Status  1.  Chronic pain syndrome   2. Chronic pelvic pain in female   3. Endometriosis   4. Insomnia, persistent   5. Mild major depression (HCC)      Persistent Persistent Persistent    Plan of Care    Stacey Stanley has a current medication list which includes the following long-term medication(s): metoprolol succinate, omeprazole, and valsartan-hydrochlorothiazide.  Pharmacotherapy (Medications Ordered): Meds ordered this encounter  Medications   tapentadol HCl (NUCYNTA) 75 MG tablet    Sig: Take 1 tablet (75 mg total) by mouth 2 (two) times daily as needed for moderate pain.    Dispense:  60 tablet    Refill:  0    Chronic pain syndrome   tapentadol HCl (NUCYNTA) 75 MG tablet    Sig: Take 1 tablet (75 mg total) by mouth 2 (two) times daily as needed for moderate pain.    Dispense:  60 tablet    Refill:  0    Chronic pain syndrome   tapentadol HCl (NUCYNTA) 75 MG tablet    Sig: Take 1 tablet (75 mg total) by mouth 2 (two) times daily as needed for moderate pain.    Dispense:  60 tablet    Refill:  0  Chronic pain syndrome   Orders:  Orders Placed This Encounter  Procedures   ToxASSURE Select 13 (MW), Urine    Volume: 30 ml(s). Minimum 3 ml of urine is needed. Document temperature of fresh sample. Indications: Long term (current) use of opiate analgesic [phone])    Order Specific Question:   Release to patient    Answer:   Immediate   Follow-up plan:   Return in about 3 months (around 04/13/2023) for Medication Management, in person.    Recent Visits No visits were found meeting these conditions. Showing recent visits within past 90 days and meeting all other requirements Today's Visits Date Type Provider Dept  01/11/23 Office Visit Edward Jolly, MD [facility]  Showing today's visits and meeting all other requirements Future Appointments Date Type Provider Dept  04/08/23 Appointment Edward Jolly, MD [facility]  Showing future  appointments within next 90 days and meeting all other requirements  I discussed the assessment and treatment plan with the patient. The patient was provided an opportunity to ask questions and all were answered. The patient agreed with the plan and demonstrated an understanding of the instructions.  Patient advised to call back or seek an in-person evaluation if the symptoms or condition worsens.  Duration of encounter: .  Total time on encounter, as per AMA guidelines included both the face-to-face and non-face-to-face time personally spent by the physician and/or other qualified health care professional(s) on the day of the encounter (includes time in activities that require the physician or other qualified health care professional and does not include time in activities normally performed by clinical staff). Physician's time may include the following activities when performed: preparing to see the patient (eg, review of tests, pre-charting review of records) obtaining and/or reviewing separately obtained history performing a medically appropriate examination and/or evaluation counseling and educating the patient/family/caregiver ordering medications, tests, or procedures referring and communicating with other health care professionals (when not separately reported) documenting clinical information in the electronic or other health record independently interpreting results (not separately reported) and communicating results to the patient/ family/caregiver care coordination (not separately reported)  Note by: Edward Jolly, MD Date: 01/11/2023; Time: 1:42 PM

## 2023-01-11 NOTE — Patient Instructions (Signed)
Medication Rules  Applies to: All patients receiving prescriptions (written or electronic).  Pharmacy of record: Pharmacy where electronic prescriptions will be sent. If written prescriptions are taken to a different pharmacy, please inform the nursing staff. The pharmacy listed in the electronic medical record should be the one where you would like electronic prescriptions to be sent.  Prescription refills: Only during scheduled appointments. Applies to both, written and electronic prescriptions.  NOTE: The following applies primarily to controlled substances (Opioid* Pain Medications).   Patient's responsibilities: Pain Pills: Bring all pain pills to every appointment (except for procedure appointments). Pill Bottles: Bring pills in original pharmacy bottle. Always bring newest bottle. Bring bottle, even if empty. Medication refills: You are responsible for knowing and keeping track of what medications you need refilled. The day before your appointment, write a list of all prescriptions that need to be refilled. Bring that list to your appointment and give it to the admitting nurse. Prescriptions will be written only during appointments. If you forget a medication, it will not be "Called in", "Faxed", or "electronically sent". You will need to get another appointment to get these prescribed. Prescription Accuracy: You are responsible for carefully inspecting your prescriptions before leaving our office. Have the discharge nurse carefully go over each prescription with you, before taking them home. Make sure that your name is accurately spelled, that your address is correct. Check the name and dose of your medication to make sure it is accurate. Check the number of pills, and the written instructions to make sure they are clear and accurate. Make sure that you are given enough medication to last until your next medication refill appointment. Taking Medication: Take medication as prescribed. Never  take more pills than instructed. Never take medication more frequently than prescribed. Taking less pills or less frequently is permitted and encouraged, when it comes to controlled substances (written prescriptions).  Inform other Doctors: Always inform, all of your healthcare providers, of all the medications you take. Pain Medication from other Providers: You are not allowed to accept any additional pain medication from any other Doctor or Healthcare provider. There are two exceptions to this rule. (see below) In the event that you require additional pain medication, you are responsible for notifying us, as stated below. Medication Agreement: You are responsible for carefully reading and following our Medication Agreement. This must be signed before receiving any prescriptions from our practice. Safely store a copy of your signed Agreement. Violations to the Agreement will result in no further prescriptions. (Additional copies of our Medication Agreement are available upon request.) Laws, Rules, & Regulations: All patients are expected to follow all Federal and State Laws, Statutes, Rules, & Regulations. Ignorance of the Laws does not constitute a valid excuse. The use of any illegal substances is prohibited. Adopted CDC guidelines & recommendations: Target dosing levels will be at or below 60 MME/day. Use of benzodiazepines** is not recommended.  Exceptions: There are only two exceptions to the rule of not receiving pain medications from other Healthcare Providers. Exception #1 (Emergencies): In the event of an emergency (i.e.: accident requiring emergency care), you are allowed to receive additional pain medication. However, you are responsible for: As soon as you are able, call our office [phone], at any time of the day or night, and leave a message stating your name, the date and nature of the emergency, and the name and dose of the medication prescribed. In the event that your call is answered  by a member of   our staff, make sure to document and save the date, time, and the name of the person that took your information.  Exception #2 (Planned Surgery): In the event that you are scheduled by another doctor or dentist to have any type of surgery or procedure, you are allowed (for a period no longer than 30 days), to receive additional pain medication, for the acute post-op pain. However, in this case, you are responsible for picking up a copy of our "Post-op Pain Management for Surgeons" handout, and giving it to your surgeon or dentist. This document is available at our office, and does not require an appointment to obtain it. Simply go to our office during business hours (Monday-Thursday from 8:00 AM to 4:00 PM) (Friday 8:00 AM to 12:00 Noon) or if you have a scheduled appointment with us, prior to your surgery, and ask for it by name. In addition, you will need to provide us with your name, name of your surgeon, type of surgery, and date of procedure or surgery.  *Opioid medications include: morphine, codeine, oxycodone, oxymorphone, hydrocodone, hydromorphone, meperidine, tramadol, tapentadol, buprenorphine, fentanyl, methadone. **Benzodiazepine medications include: diazepam (Valium), alprazolam (Xanax), clonazepam (Klonopine), lorazepam (Ativan), clorazepate (Tranxene), chlordiazepoxide (Librium), estazolam (Prosom), oxazepam (Serax), temazepam (Restoril), triazolam (Halcion) (Last updated: 07/29/2017)  

## 2023-01-12 ENCOUNTER — Other Ambulatory Visit (HOSPITAL_BASED_OUTPATIENT_CLINIC_OR_DEPARTMENT_OTHER): Payer: Self-pay

## 2023-01-12 ENCOUNTER — Telehealth: Payer: Self-pay | Admitting: Student in an Organized Health Care Education/Training Program

## 2023-01-12 DIAGNOSIS — G894 Chronic pain syndrome: Secondary | ICD-10-CM | POA: Diagnosis not present

## 2023-01-12 NOTE — Telephone Encounter (Signed)
Informed patient that she would not be able to pick up the medication until 01-16-2023.  Patient states understanding.

## 2023-01-12 NOTE — Telephone Encounter (Signed)
PT called states that [facility] will not fill her prescription for Nucynta. PT stated that the pharmacy says she has to wait 30 days. PT give patient a call. TY

## 2023-01-13 ENCOUNTER — Encounter: Payer: Self-pay | Admitting: Family Medicine

## 2023-01-18 DIAGNOSIS — M65331 Trigger finger, right middle finger: Secondary | ICD-10-CM | POA: Diagnosis not present

## 2023-01-25 ENCOUNTER — Other Ambulatory Visit (HOSPITAL_BASED_OUTPATIENT_CLINIC_OR_DEPARTMENT_OTHER): Payer: Self-pay

## 2023-01-26 ENCOUNTER — Other Ambulatory Visit (HOSPITAL_BASED_OUTPATIENT_CLINIC_OR_DEPARTMENT_OTHER): Payer: Self-pay

## 2023-01-26 MED ORDER — VALSARTAN-HYDROCHLOROTHIAZIDE 80-12.5 MG PO TABS: 1.0000 | ORAL_TABLET | Freq: Every day | ORAL | 3 refills | Status: DC

## 2023-01-26 MED ORDER — ACETAMINOPHEN 500 MG PO TABS: 1000.0000 mg | ORAL_TABLET | Freq: Four times a day (QID) | ORAL | 1 refills | Status: DC | PRN

## 2023-01-26 MED ORDER — GABAPENTIN 300 MG PO CAPS: 300.0000 mg | ORAL_CAPSULE | Freq: Every day | ORAL | 1 refills | Status: DC

## 2023-01-26 MED ORDER — OZEMPIC (0.25 OR 0.5 MG/DOSE) 2 MG/3ML ~~LOC~~ SOPN: 0.5000 mg | PEN_INJECTOR | SUBCUTANEOUS | 0 refills | Status: DC

## 2023-01-26 MED ORDER — MELOXICAM 15 MG PO TABS: 15.0000 mg | ORAL_TABLET | Freq: Every day | ORAL | 0 refills | Status: DC | PRN

## 2023-01-26 MED ORDER — CLINDAMYCIN PHOSPHATE 1 % EX LOTN
1.0000 "application " | TOPICAL_LOTION | Freq: Every morning | CUTANEOUS | 2 refills | Status: DC
  Filled 2023-07-16: qty 60, 30d supply, fill #0

## 2023-01-26 MED ORDER — MUPIROCIN 2 % EX OINT
1.0000 | TOPICAL_OINTMENT | Freq: Three times a day (TID) | CUTANEOUS | 1 refills | Status: DC
  Filled 2023-07-16: qty 22, 8d supply, fill #0

## 2023-01-29 ENCOUNTER — Other Ambulatory Visit (HOSPITAL_BASED_OUTPATIENT_CLINIC_OR_DEPARTMENT_OTHER): Payer: Self-pay

## 2023-02-02 ENCOUNTER — Encounter: Payer: Self-pay | Admitting: Pharmacist

## 2023-02-02 DIAGNOSIS — M25641 Stiffness of right hand, not elsewhere classified: Secondary | ICD-10-CM | POA: Diagnosis not present

## 2023-02-02 DIAGNOSIS — M25541 Pain in joints of right hand: Secondary | ICD-10-CM | POA: Diagnosis not present

## 2023-02-02 DIAGNOSIS — M65331 Trigger finger, right middle finger: Secondary | ICD-10-CM | POA: Diagnosis not present
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
## 2023-02-03 ENCOUNTER — Encounter: Payer: Self-pay | Admitting: Family Medicine

## 2023-02-03 ENCOUNTER — Other Ambulatory Visit (HOSPITAL_BASED_OUTPATIENT_CLINIC_OR_DEPARTMENT_OTHER): Payer: Self-pay

## 2023-02-03 ENCOUNTER — Ambulatory Visit: Payer: Federal, State, Local not specified - PPO | Admitting: Family Medicine

## 2023-02-03 VITALS — BP 120/72 | HR 95 | Resp 16 | Ht 65.0 in | Wt 189.0 lb

## 2023-02-03 DIAGNOSIS — E538 Deficiency of other specified B group vitamins: Secondary | ICD-10-CM

## 2023-02-03 DIAGNOSIS — E669 Obesity, unspecified: Secondary | ICD-10-CM

## 2023-02-03 DIAGNOSIS — F411 Generalized anxiety disorder: Secondary | ICD-10-CM

## 2023-02-03 DIAGNOSIS — E8881 Metabolic syndrome: Secondary | ICD-10-CM

## 2023-02-03 DIAGNOSIS — Z23 Encounter for immunization: Secondary | ICD-10-CM

## 2023-02-03 DIAGNOSIS — G4700 Insomnia, unspecified: Secondary | ICD-10-CM | POA: Diagnosis not present

## 2023-02-03 DIAGNOSIS — I471 Supraventricular tachycardia, unspecified: Secondary | ICD-10-CM | POA: Diagnosis not present

## 2023-02-03 DIAGNOSIS — I10 Essential (primary) hypertension: Secondary | ICD-10-CM

## 2023-02-03 DIAGNOSIS — F325 Major depressive disorder, single episode, in full remission: Secondary | ICD-10-CM

## 2023-02-03 MED ORDER — WEGOVY 1 MG/0.5ML ~~LOC~~ SOAJ
1.0000 mg | SUBCUTANEOUS | 0 refills | Status: DC
  Filled 2023-02-03: qty 2, fill #0
  Filled 2023-02-25: qty 2, 28d supply, fill #0

## 2023-02-03 MED ORDER — WEGOVY 1.7 MG/0.75ML ~~LOC~~ SOAJ
1.7000 mg | SUBCUTANEOUS | 0 refills | Status: DC
Start: 2023-02-03 — End: 2023-05-05
  Filled 2023-02-03: qty 3, fill #0
  Filled 2023-02-25: qty 3, 28d supply, fill #0

## 2023-02-03 MED ORDER — WEGOVY 0.5 MG/0.5ML ~~LOC~~ SOAJ
0.5000 mg | SUBCUTANEOUS | 0 refills | Status: DC
Start: 2023-02-03 — End: 2023-05-05
  Filled 2023-02-03: qty 2, 28d supply, fill #0

## 2023-02-03 MED ORDER — METOPROLOL SUCCINATE ER 25 MG PO TB24
25.0000 mg | ORAL_TABLET | Freq: Every day | ORAL | 0 refills | Status: DC
Start: 2023-02-03 — End: 2023-05-05
  Filled 2023-02-03 – 2023-02-25 (×2): qty 90, 90d supply, fill #0

## 2023-02-03 MED ORDER — BUSPIRONE HCL 10 MG PO TABS
10.0000 mg | ORAL_TABLET | Freq: Two times a day (BID) | ORAL | 0 refills | Status: DC
  Filled 2023-02-03: qty 180, 90d supply, fill #0

## 2023-02-03 MED ORDER — VALSARTAN-HYDROCHLOROTHIAZIDE 80-12.5 MG PO TABS
1.0000 | ORAL_TABLET | Freq: Every day | ORAL | 0 refills | Status: DC
Start: 2023-02-03 — End: 2023-05-05
  Filled 2023-02-03: qty 90, 90d supply, fill #0

## 2023-02-03 NOTE — Addendum Note (Signed)
Addended by: Dollene Primrose on: 02/03/2023 11:11 AM   Modules accepted: Orders

## 2023-02-08 DIAGNOSIS — M25541 Pain in joints of right hand: Secondary | ICD-10-CM | POA: Diagnosis not present

## 2023-02-08 DIAGNOSIS — M65331 Trigger finger, right middle finger: Secondary | ICD-10-CM | POA: Diagnosis not present

## 2023-02-08 DIAGNOSIS — M25641 Stiffness of right hand, not elsewhere classified: Secondary | ICD-10-CM | POA: Diagnosis not present

## 2023-02-10 ENCOUNTER — Encounter: Payer: Self-pay | Admitting: Family Medicine

## 2023-02-10 ENCOUNTER — Other Ambulatory Visit: Payer: Self-pay | Admitting: *Deleted

## 2023-02-10 ENCOUNTER — Telehealth: Payer: Self-pay | Admitting: Student in an Organized Health Care Education/Training Program

## 2023-02-10 ENCOUNTER — Encounter: Payer: Self-pay | Admitting: Student in an Organized Health Care Education/Training Program

## 2023-02-10 DIAGNOSIS — G894 Chronic pain syndrome: Secondary | ICD-10-CM

## 2023-02-10 NOTE — Telephone Encounter (Signed)
Returned patient call, called [facility] to see what is going on with Rx;s for Nucynta, they report that the September and October Rx's were cancelled by provider. No documentation of that and they did fill August Rx.  Called patient back and told her I would send med request to [facility] and the pharmacy that she would like to use.  Told patient I would let her know his response on tomorrow.

## 2023-02-10 NOTE — Telephone Encounter (Signed)
PT states that she was told by [facility] that her prescription for Tapentadol 75 mg was closed by the provider and send to another pharmacy. PT states that she hasn't switch pharmacy. That she went thur the same thing last month. Please give patient a call. TY

## 2023-02-11 ENCOUNTER — Telehealth: Payer: Self-pay | Admitting: Student in an Organized Health Care Education/Training Program

## 2023-02-11 ENCOUNTER — Other Ambulatory Visit (HOSPITAL_BASED_OUTPATIENT_CLINIC_OR_DEPARTMENT_OTHER): Payer: Self-pay

## 2023-02-11 MED ORDER — TAPENTADOL HCL 75 MG PO TABS
75.0000 mg | ORAL_TABLET | Freq: Two times a day (BID) | ORAL | 0 refills | Status: DC | PRN
  Filled 2023-03-13: qty 60, 30d supply, fill #0

## 2023-02-11 MED ORDER — TAPENTADOL HCL 75 MG PO TABS
75.0000 mg | ORAL_TABLET | Freq: Two times a day (BID) | ORAL | 0 refills | Status: AC | PRN
  Filled 2023-02-11 – 2023-02-12 (×2): qty 60, 30d supply, fill #0

## 2023-02-11 NOTE — Telephone Encounter (Signed)
Noted and FYI Dr Cherylann Ratel.

## 2023-02-11 NOTE — Telephone Encounter (Signed)
PT states that her PC doctor was canceling Dr. Cherylann Ratel orders that he was putting in. PT states that she had send a msg thur [employer]

## 2023-02-12 ENCOUNTER — Other Ambulatory Visit (HOSPITAL_BASED_OUTPATIENT_CLINIC_OR_DEPARTMENT_OTHER): Payer: Self-pay

## 2023-02-14 ENCOUNTER — Encounter: Payer: Self-pay | Admitting: Family Medicine

## 2023-02-15 DIAGNOSIS — M25541 Pain in joints of right hand: Secondary | ICD-10-CM | POA: Diagnosis not present

## 2023-02-15 DIAGNOSIS — M25641 Stiffness of right hand, not elsewhere classified: Secondary | ICD-10-CM | POA: Diagnosis not present

## 2023-02-15 DIAGNOSIS — M65331 Trigger finger, right middle finger: Secondary | ICD-10-CM | POA: Diagnosis not present

## 2023-02-22 DIAGNOSIS — M65331 Trigger finger, right middle finger: Secondary | ICD-10-CM | POA: Diagnosis not present

## 2023-02-25 ENCOUNTER — Other Ambulatory Visit (HOSPITAL_BASED_OUTPATIENT_CLINIC_OR_DEPARTMENT_OTHER): Payer: Self-pay

## 2023-02-25 ENCOUNTER — Other Ambulatory Visit: Payer: Self-pay

## 2023-03-01 DIAGNOSIS — M65331 Trigger finger, right middle finger: Secondary | ICD-10-CM | POA: Diagnosis not present

## 2023-03-03 ENCOUNTER — Encounter: Payer: Self-pay | Admitting: Family Medicine

## 2023-03-03 DIAGNOSIS — M65331 Trigger finger, right middle finger: Secondary | ICD-10-CM | POA: Diagnosis not present

## 2023-03-03 DIAGNOSIS — M25541 Pain in joints of right hand: Secondary | ICD-10-CM | POA: Diagnosis not present

## 2023-03-03 DIAGNOSIS — M25641 Stiffness of right hand, not elsewhere classified: Secondary | ICD-10-CM | POA: Diagnosis not present

## 2023-03-04 ENCOUNTER — Other Ambulatory Visit (HOSPITAL_BASED_OUTPATIENT_CLINIC_OR_DEPARTMENT_OTHER): Payer: Self-pay

## 2023-03-09 ENCOUNTER — Other Ambulatory Visit (HOSPITAL_BASED_OUTPATIENT_CLINIC_OR_DEPARTMENT_OTHER): Payer: Self-pay

## 2023-03-11 ENCOUNTER — Other Ambulatory Visit (HOSPITAL_BASED_OUTPATIENT_CLINIC_OR_DEPARTMENT_OTHER): Payer: Self-pay

## 2023-03-12 ENCOUNTER — Other Ambulatory Visit (HOSPITAL_BASED_OUTPATIENT_CLINIC_OR_DEPARTMENT_OTHER): Payer: Self-pay

## 2023-03-13 ENCOUNTER — Other Ambulatory Visit (HOSPITAL_BASED_OUTPATIENT_CLINIC_OR_DEPARTMENT_OTHER): Payer: Self-pay

## 2023-03-22 DIAGNOSIS — M47812 Spondylosis without myelopathy or radiculopathy, cervical region: Secondary | ICD-10-CM | POA: Diagnosis not present

## 2023-03-22 DIAGNOSIS — M791 Myalgia, unspecified site: Secondary | ICD-10-CM | POA: Diagnosis not present

## 2023-03-23 ENCOUNTER — Ambulatory Visit: Payer: Federal, State, Local not specified - PPO | Admitting: Neurology

## 2023-03-23 ENCOUNTER — Other Ambulatory Visit (INDEPENDENT_AMBULATORY_CARE_PROVIDER_SITE_OTHER): Payer: Federal, State, Local not specified - PPO

## 2023-03-23 ENCOUNTER — Other Ambulatory Visit (HOSPITAL_BASED_OUTPATIENT_CLINIC_OR_DEPARTMENT_OTHER): Payer: Self-pay

## 2023-03-23 ENCOUNTER — Encounter: Payer: Self-pay | Admitting: Neurology

## 2023-03-23 VITALS — BP 133/79 | HR 82 | Ht 65.0 in | Wt 189.0 lb

## 2023-03-23 DIAGNOSIS — G2581 Restless legs syndrome: Secondary | ICD-10-CM | POA: Diagnosis not present

## 2023-03-23 LAB — TSH: TSH: 0.86 u[IU]/mL (ref 0.35–5.50)

## 2023-03-23 LAB — FERRITIN: Ferritin: 57.3 ng/mL (ref 10.0–291.0)

## 2023-03-23 MED ORDER — ROPINIROLE HCL 0.25 MG PO TABS
0.2500 mg | ORAL_TABLET | Freq: Every day | ORAL | 5 refills | Status: DC
  Filled 2023-03-23: qty 30, 30d supply, fill #0

## 2023-03-23 NOTE — Patient Instructions (Signed)
Start ropinirole 0.25mg  at bedtime Check labs

## 2023-03-23 NOTE — Progress Notes (Signed)
Follow-up Visit   Date: 03/23/2023    LOVELLA KUSLER MRN: [id number] DOB: [birth year]    Stacey Stanley is a 47 y.o. 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  The patient was accompanied self.     IMPRESSION/PLAN: Bilateral leg dysesthesia suggestive of restless leg syndrome  - Start ropinirole 0.25mg  at bedtime  - Check TSH, ferritin  Generalized paresthesias, resolved.  Return to clinic in 4 months  --------------------------------------------- History of present illness: In July 2023, she had left face, arm and leg numbness/tingling which progressed over 2-3 days.  She went to the ER for this and had MRI brain which did not show any acute change.  She was started on dual-antiplatelet therapy for possible TIA and now takes aspirin.  No associated headaches. She continues to have these symptoms 4-5 times per week, which lasts about 6 hours.    She also complains of crawling sensation in the legs which is intermittent throughout the week.  This feels as if there could stray hair on her skin.  These symptoms last a few minutes.  She denies urge to move the legs.   No falls or imbalance.    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    UPDATE 03/23/2023:  She is here for follow-up visit with new complaints of restless sensation of the legs.  Starting around July, she began having electrical sensation in the legs which she is awake and finds that her legs are always moving.  She denies the urge to move the legs, but getting up and walking tends to alleviate her symptoms.  She has tried leg massagers which can help. It can last for several hours.   She no longer has generalized numbness/tingling for which she was seen here in 2023.   Medications:  Current Outpatient Medications on File Prior to  Visit  Medication Sig Dispense Refill   acetaminophen (TYLENOL) 500 MG tablet Take 500 mg by mouth every 6 (six) hours as needed for mild pain.     aspirin 81 MG chewable tablet Chew 1 tablet (81 mg total) by mouth daily. 90 tablet 0   busPIRone (BUSPAR) 10 MG tablet Take 1 tablet (10 mg total) by mouth 2 (two) times daily. 180 tablet 0   clindamycin (CLEOCIN T) 1 % lotion Apply 1 application to affected area topically every morning. 60 mL 2   Dapsone 5 % topical gel Apply 60 g topically daily.     ibuprofen (ADVIL) 200 MG tablet Take 200 mg by mouth every 6 (six) hours as needed for mild pain.     imiquimod (ALDARA) 5 % cream Apply 1 packet topically daily as needed (warts).     metoprolol succinate (TOPROL-XL) 25 MG 24 hr tablet Take 1 tablet (25 mg total) by mouth daily. 90 tablet 0   mupirocin ointment (BACTROBAN) 2 % Apply 1 Application topically to affected area 2 (two) to 3 (three) times daily. 22 g 1   Omega-3 Fatty Acids (FISH OIL) 1000 MG CAPS Take 2,000 mg by mouth daily.     Semaglutide-Weight Management (WEGOVY) 0.5 MG/0.5ML SOAJ Inject 0.5 mg into the skin once a week. 2 mL 0   Semaglutide-Weight Management (WEGOVY) 1 MG/0.5ML SOAJ Inject 1 mg into the skin once a week. 2 mL 0   Semaglutide-Weight Management (WEGOVY) 1.7 MG/0.75ML  SOAJ Inject 1.7 mg into the skin once a week. 3 mL 0   tapentadol HCl (NUCYNTA) 75 MG tablet Take 1 tablet (75 mg total) by mouth 2 (two) times daily as needed for moderate pain. 60 tablet 0   TRI-LUMA 0.01-4-0.05 % CREA Apply 30 mg topically daily.     TRULANCE 3 MG TABS Take 1 tablet by mouth daily as needed (constipation).     valsartan-hydrochlorothiazide (DIOVAN-HCT) 80-12.5 MG tablet Take 1 tablet by mouth daily. 90 tablet 0   No current facility-administered medications on file prior to visit.    Allergies:  Allergies  Allergen Reactions   Nuvigil [Armodafinil]     Chest pain    Ppd [Tuberculin Purified Protein Derivative]     Vital  Signs:  BP 133/79   Pulse 82   Ht 5\' 5"  (1.651 m)   Wt 189 lb (85.7 kg)   LMP 07/09/2015 (Exact Date)   SpO2 98%   BMI 31.45 kg/m    Neurological Exam: MENTAL STATUS including orientation to time, place, person, recent and remote memory, attention span and concentration, language, and fund of knowledge is normal.  Speech is not dysarthric.  CRANIAL NERVES:  Pupils equal round and reactive to light.  Normal conjugate, extra-ocular eye movements in all directions of gaze.  No ptosis.  Face is symmetric. Palate elevates symmetrically.  Tongue is midline.  MOTOR:  Motor strength is 5/5 in all extremities.  No atrophy, fasciculations or abnormal movements.  No pronator drift.  Tone is normal.    MSRs:  Reflexes are 2+/4 throughout.  SENSORY:  Intact to vibration and pin prick throughout.  COORDINATION/GAIT:  Normal finger-to- nose-finger.  Intact rapid alternating movements bilaterally.  Gait narrow based and stable.   Data: Lab Results  Component Value Date   TSH 1.19 11/28/2020   Lab Results  Component Value Date   VITAMINB12 378 08/13/2022   Lab Results  Component Value Date   FERRITIN 43 01/10/2016   MRI cervical spine 07/18/2022: 1. Motion degraded examination. 2. Mild cervical spondylosis without significant spinal stenosis. 3. Mild right neural foraminal stenosis at C5-6.  MRI brain 05/17/2022: Substantial susceptibility artifact from dental braces, similar to July Brain MRI. No intracranial abnormality identified.   Thank you for allowing me to participate in patient's care.  If I can answer any additional questions, I would be pleased to do so.    Sincerely,    Jacquese Cassarino K. Allena Katz, DO

## 2023-03-29 DIAGNOSIS — M65331 Trigger finger, right middle finger: Secondary | ICD-10-CM | POA: Diagnosis not present

## 2023-03-31 ENCOUNTER — Encounter: Payer: Self-pay | Admitting: Neurology

## 2023-04-02 ENCOUNTER — Other Ambulatory Visit (HOSPITAL_COMMUNITY): Payer: Self-pay

## 2023-04-02 ENCOUNTER — Encounter (HOSPITAL_BASED_OUTPATIENT_CLINIC_OR_DEPARTMENT_OTHER): Payer: Self-pay | Admitting: *Deleted

## 2023-04-02 ENCOUNTER — Encounter (HOSPITAL_BASED_OUTPATIENT_CLINIC_OR_DEPARTMENT_OTHER): Payer: Self-pay | Admitting: Cardiovascular Disease

## 2023-04-02 ENCOUNTER — Ambulatory Visit (HOSPITAL_BASED_OUTPATIENT_CLINIC_OR_DEPARTMENT_OTHER): Payer: Federal, State, Local not specified - PPO | Admitting: Cardiovascular Disease

## 2023-04-02 VITALS — BP 100/78 | HR 88 | Ht 65.0 in | Wt 186.3 lb

## 2023-04-02 DIAGNOSIS — R079 Chest pain, unspecified: Secondary | ICD-10-CM

## 2023-04-02 DIAGNOSIS — I10 Essential (primary) hypertension: Secondary | ICD-10-CM

## 2023-04-02 DIAGNOSIS — R7303 Prediabetes: Secondary | ICD-10-CM | POA: Diagnosis not present

## 2023-04-02 DIAGNOSIS — Z8673 Personal history of transient ischemic attack (TIA), and cerebral infarction without residual deficits: Secondary | ICD-10-CM

## 2023-04-02 MED ORDER — IVABRADINE HCL 5 MG PO TABS
10.0000 mg | ORAL_TABLET | Freq: Once | ORAL | 0 refills | Status: DC
Start: 2023-04-02 — End: 2023-04-05
  Filled 2023-04-02: qty 2, 1d supply, fill #0

## 2023-04-02 NOTE — Patient Instructions (Addendum)
Medication Instructions:  TAKE IVABRADINE 5 MG 2 TABLETS 2 HOURS PRIOR TO CT   TAKE YOUR METOPROLOL 2 HOURS PRIOR TO CT   *If you need a refill on your cardiac medications before your next appointment, please call your pharmacy*  Lab Work: BMET 1 WEEK PRIOR TO CT   If you have labs (blood work) drawn today and your tests are completely normal, you will receive your results only by: MyChart Message (if you have MyChart) OR A paper copy in the mail If you have any lab test that is abnormal or we need to change your treatment, we will call you to review the results.  Testing/Procedures: Your physician has requested that you have cardiac CT. Cardiac computed tomography (CT) is a painless test that uses an x-ray machine to take clear, detailed pictures of your heart. For further information please visit [email or web address] Please follow instruction sheet as given.  Follow-Up: At [facility], you and your health needs are our priority.  As part of our continuing mission to provide you with exceptional heart care, we have created designated Provider Care Teams.  These Care Teams include your primary Cardiologist (physician) and Advanced Practice Providers (APPs -  Physician Assistants and Nurse Practitioners) who all work together to provide you with the care you need, when you need it.  We recommend signing up for the patient portal called "MyChart".  Sign up information is provided on this After Visit Summary.  MyChart is used to connect with patients for Virtual Visits (Telemedicine).  Patients are able to view lab/test results, encounter notes, upcoming appointments, etc.  Non-urgent messages can be sent to your provider as well.   To learn more about what you can do with MyChart, go to [email or web address].    Your next appointment:   2 month(s)  Provider:   Gillian Shields, NP    Other Instructions   Your cardiac CT will be scheduled at one of the below locations:    [facility] [address] [place], [place] [address] [phone]  OR  [facility] [address] [place], [place] [address] [phone]  OR   [facility] [address] [place], [place] [address] [phone]  If scheduled at [facility], please arrive at the [facility] and Children's Entrance (Entrance C2) of [facility] 30 minutes prior to test start time. You can use the FREE valet parking offered at entrance C (encouraged to control the heart rate for the test)  Proceed to the [facility] Radiology Department (first floor) to check-in and test prep.  All radiology patients and guests should use entrance C2 at [facility], accessed from [facility], even though the hospital's physical address listed is [address]    If scheduled at [facility] or [facility], please arrive 15 mins early for check-in and test prep.  There is spacious parking and easy access to the radiology department from the [facility] Heart and Vascular entrance. Please enter here and check-in with the desk attendant.   Please follow these instructions carefully (unless otherwise directed):  An IV will be required for this test and Nitroglycerin will be given.  Hold all erectile dysfunction medications at least 3 days (72 hrs) prior to test. (Ie viagra, cialis, sildenafil, tadalafil, etc)   On the Night Before the Test: Be sure to Drink plenty of water. Do not consume any caffeinated/decaffeinated beverages  or chocolate 12 hours prior to your test. Do not take any antihistamines 12 hours prior to your test.  DAY OF TEST: Do not eat any food 1 hour prior to test. You may take your regular medications prior to the test.  Take metoprolol (Lopressor) two hours prior to test. If you take  Furosemide/Hydrochlorothiazide/Spironolactone, please HOLD on the morning of the test. FEMALES- please wear underwire-free bra if available, avoid dresses & tight clothing      After the Test: Drink plenty of water. After receiving IV contrast, you may experience a mild flushed feeling. This is normal. On occasion, you may experience a mild rash up to 24 hours after the test. This is not dangerous. If this occurs, you can take Benadryl 25 mg and increase your fluid intake. If you experience trouble breathing, this can be serious. If it is severe call 911 IMMEDIATELY. If it is mild, please call our office. If you take any of these medications: Glipizide/Metformin, Avandament, Glucavance, please do not take 48 hours after completing test unless otherwise instructed.  We will call to schedule your test 2-4 weeks out understanding that some insurance companies will need an authorization prior to the service being performed.   For more information and frequently asked questions, please visit our website : [email or web address]  For non-scheduling related questions, please contact the cardiac imaging nurse navigator should you have any questions/concerns: Cardiac Imaging Nurse Navigators Direct Office Dial: [phone]   For scheduling needs, including cancellations and rescheduling, please call [place], [phone]. Cardiac CT Angiogram A cardiac CT angiogram is a procedure to look at the heart and the area around the heart. It may be done to help find the cause of chest pains or other symptoms of heart disease. During this procedure, a substance called contrast dye is injected into a vein in the arm. The contrast highlights the blood vessels in the area to be checked. A large X-ray machine (CT scanner), then takes detailed pictures of the heart and the surrounding area. The procedure is also sometimes called a coronary CT angiogram, coronary artery scanning, or CTA. A cardiac CT  angiogram allows the health care provider to see how well blood is flowing to and from the heart. The provider will be able to see if there are any problems, such as: Blockage or narrowing of the arteries in the heart. Fluid around the heart. Signs of weakness or disease in the muscles, valves, and tissues of the heart. Tell a health care provider about: Any allergies you have. This is especially important if you have had a previous allergic reaction to medicines, contrast dye, or iodine. All medicines you are taking, including vitamins, herbs, eye drops, creams, and over-the-counter medicines. Any bleeding problems you have. Any surgeries you have had. Any medical conditions you have, including kidney problems or kidney failure. Whether you are pregnant or may be pregnant. Any anxiety disorders, chronic pain, or other conditions you have. These may increase your stress or prevent you from lying still. Any history of abnormal heart rhythms or heart procedures. What are the risks? Your provider will talk with you about risks. These may include: Bleeding. Infection. Allergic reactions to medicines or dyes. Damage to other structures or organs. Kidney damage from the contrast dye. Increased risk of cancer from radiation exposure. This risk is low. Talk with your provider about: The risks and benefits of testing. How you can receive the lowest dose of radiation. What happens before the procedure? Wear comfortable clothing and remove  any jewelry, glasses, dentures, and hearing aids. Follow instructions from your provider about eating and drinking. These may include: 12 hours before the procedure Avoid caffeine. This includes tea, coffee, soda, energy drinks, and diet pills. Drink plenty of water or other fluids that do not have caffeine in them. Being well hydrated can prevent complications. 4-6 hours before the procedure Stop eating and drinking. This will reduce the risk of nausea from the  contrast dye. Ask your provider about changing or stopping your regular medicines. These include: Diabetes medicines. Medicines to treat problems with erections (erectile dysfunction). If you have kidney problems, you may need to receive IV hydration before and after the test. What happens during the procedure?  Hair on your chest may need to be removed so that small sticky patches called electrodes can be placed on your chest. These will transmit information that helps to monitor your heart during the procedure. An IV will be inserted into one of your veins. You might be given a medicine to control your heart rate during the procedure. This will help to ensure that good images are obtained. You will be asked to lie on an exam table. This table will slide in and out of the CT machine during the procedure. Contrast dye will be injected into the IV. You might feel warm, or you may get a metallic taste in your mouth. You may be given medicines to relax or dilate the arteries in your heart. If you are allergic to contrast dyes or iodine you may be given medicine before the test to reduce the risk of an allergic reaction. The table that you are lying on will move into the CT machine tunnel for the scan. The person running the machine will give you instructions while the scans are being done. You may be asked to: Keep your arms above your head. Hold your breath for short periods. Stay very still, even if the table is moving. The procedure may vary among providers and hospitals. What can I expect after the procedure? After your procedure, it is common to have: A metallic taste in your mouth from the contrast dye. A feeling of warmth. A headache from the heart medicine. Follow these instructions at home: Take over-the-counter and prescription medicines only as told by your provider. If you are told, drink enough fluid to keep your pee pale yellow. This will help to flush the contrast dye out of  your body. Most people can return to their normal activities right after the procedure. Ask your provider what activities are safe for you. It is up to you to get the results of your procedure. Ask your provider, or the department that is doing the procedure, when your results will be ready. Contact a health care provider if: You have any symptoms of allergy to the contrast dye. These include: Shortness of breath. Rash or hives. A racing heartbeat. You notice a change in your peeing (urination). This information is not intended to replace advice given to you by your health care provider. Make sure you discuss any questions you have with your health care provider. Document Revised: 12/19/2021 Document Reviewed: 12/19/2021 Elsevier Patient Education  2024 [employer].

## 2023-04-02 NOTE — Progress Notes (Signed)
Cardiology Office Note:  .   Date:  04/02/2023  ID:  Stacey Stanley, DOB 08/08/1975, MRN [id number] PCP: Alba Cory, MD  [facility] Providers Cardiologist:  None     History of Present Illness: .   Stacey Stanley is a 47 y.o. female who is being seen today for the evaluation of dizziness and palpitations at the request of Terrilee Files, MD. she saw her PCP 12/2022 and reported months of dizziness and palpitations.  She noted that it often happens when going to the grocery store.  She is been seen in the emergency department several times without a clear explanation.  EKG at that visit was unremarkable.  She wore an ambulatory monitor that revealed 4 beats of SVT but was otherwise unremarkable.  She started on metoprolol and referred to cardiology for further evaluation.  During that visit she also reported active chest pain while in the room.  Again, EKG was unremarkable.  There is some suspicion that anxiety and stress may be contributing.  [name] presented with a chief complaint of persistent chest pain, dizziness, and shortness of breath. The symptoms began around the ninth or tenth of July and have progressively worsened. The chest pain is described as a squeezing sensation, as if "somebody's taking that heart and just won't let it go." The pain is constant and often occurs at night, disrupting sleep. The patient also reported episodes of excessive sweating associated with the chest pain.  In addition to the chest pain, the patient reported a nagging pain in the left calf and thigh. The discomfort in the leg is intermittent but fairly constant, occurring several times a day. The patient noted that the pain seems to worsen when lying down. To manage the discomfort, the patient has been wearing thigh-high compression socks, which provide some relief.  The patient has a 30-year history of smoking but quit recently. Despite cessation, the patient reported feeling worse now  than when she was smoking. The patient has visited the emergency room four times due to the severity of the symptoms but no cause has been identified.  The patient is currently on metoprolol, aspirin, and fish oil for cardiovascular health, and valsartan and hydrochlorothiazide for hypertension. The patient also reported taking Wegovy for weight management, but the usage has been inconsistent due to a shortage of the medication.  The patient denied any illicit drug use or alcohol consumption. The patient also reported a family history of stroke on the maternal side but no known history of heart disease. The patient expressed concern about her health and the impact of the symptoms on her daily life and future career plans.    ROS:  As per HPI  Studies Reviewed: [place] Kitchen       Ambulatory monitor 12/2022:  HR 52 - 140, average 83 bpm. 1 nonsustained SVT, longest 4 beats. Rare supraventricular and ventricular ectopy. No sustained arrhythmias. No atrial fibrillation. Symptom trigger episodes correspond to sinus rhythm.  Risk Assessment/Calculations:         Physical Exam:    VS:  BP 100/78   Pulse 88   Ht 5\' 5"  (1.651 m)   Wt 186 lb 4.8 oz (84.5 kg)   LMP 07/09/2015 (Exact Date)   SpO2 95%   BMI 31.00 kg/m  , BMI Body mass index is 31 kg/m. GENERAL:  Well appearing HEENT: Pupils equal round and reactive, fundi not visualized, oral mucosa unremarkable NECK:  No jugular venous distention, waveform within normal limits,  carotid upstroke brisk and symmetric, no bruits, no thyromegaly LUNGS:  Clear to auscultation bilaterally HEART:  RRR.  PMI not displaced or sustained,S1 and S2 within normal limits, no S3, no S4, no clicks, no rubs, no murmurs ABD:  Flat, positive bowel sounds normal in frequency in pitch, no bruits, no rebound, no guarding, no midline pulsatile mass, no hepatomegaly, no splenomegaly EXT:  2 plus pulses throughout, no edema, no cyanosis no clubbing SKIN:  No rashes no  nodules NEURO:  Cranial nerves II through XII grossly intact, motor grossly intact throughout PSYCH:  Cognitively intact, oriented to person place and time   ASSESSMENT AND PLAN: .    # Chest Pain Frequent episodes of chest pain, often at rest and at night, associated with diaphoresis. No nausea. Episodes of near syncope. Differential includes cardiac etiology vs esophageal spasm. -Order Cardiac CT to evaluate for coronary artery disease. -Continue Aspirin until results of Cardiac CT are available.  # Left Leg Pain Frequent discomfort in left calf and thigh, worse when lying down. Wearing compression socks provides some relief. -Continue use of compression socks.  # Supraventricular Tachycardia (SVT) Daily episodes of heart racing. -Continue Metoprolol 25mg  for symptom control.  # Hypertension Managed with Valsartan and Hydrochlorothiazide. -Continue current regimen.  # Weight Management On [id number] 1mg  for weight loss. -Continue current regimen.  General Health Maintenance -Encourage increased fluid intake to prevent dehydration. -Follow up with Gillian Shields, NP in 1 month.      Signed, Chilton Si, MD

## 2023-04-04 ENCOUNTER — Encounter: Payer: Self-pay | Admitting: Student in an Organized Health Care Education/Training Program

## 2023-04-05 ENCOUNTER — Other Ambulatory Visit (HOSPITAL_BASED_OUTPATIENT_CLINIC_OR_DEPARTMENT_OTHER): Payer: Self-pay

## 2023-04-05 ENCOUNTER — Other Ambulatory Visit (HOSPITAL_COMMUNITY): Payer: Self-pay

## 2023-04-05 MED ORDER — METOPROLOL TARTRATE 100 MG PO TABS
100.0000 mg | ORAL_TABLET | Freq: Once | ORAL | 0 refills | Status: DC
  Filled 2023-04-05: qty 1, 1d supply, fill #0

## 2023-04-08 ENCOUNTER — Ambulatory Visit
Payer: Federal, State, Local not specified - PPO | Attending: Student in an Organized Health Care Education/Training Program | Admitting: Student in an Organized Health Care Education/Training Program

## 2023-04-08 ENCOUNTER — Other Ambulatory Visit (HOSPITAL_BASED_OUTPATIENT_CLINIC_OR_DEPARTMENT_OTHER): Payer: Self-pay

## 2023-04-08 ENCOUNTER — Encounter: Payer: Self-pay | Admitting: Student in an Organized Health Care Education/Training Program

## 2023-04-08 VITALS — BP 117/83 | HR 79 | Temp 97.8°F | Resp 16 | Ht 65.0 in | Wt 186.0 lb

## 2023-04-08 DIAGNOSIS — N809 Endometriosis, unspecified: Secondary | ICD-10-CM | POA: Diagnosis not present

## 2023-04-08 DIAGNOSIS — F320 Major depressive disorder, single episode, mild: Secondary | ICD-10-CM | POA: Diagnosis not present

## 2023-04-08 DIAGNOSIS — Z79899 Other long term (current) drug therapy: Secondary | ICD-10-CM | POA: Diagnosis not present

## 2023-04-08 DIAGNOSIS — G8929 Other chronic pain: Secondary | ICD-10-CM | POA: Insufficient documentation

## 2023-04-08 DIAGNOSIS — G894 Chronic pain syndrome: Secondary | ICD-10-CM | POA: Insufficient documentation

## 2023-04-08 DIAGNOSIS — R102 Pelvic and perineal pain: Secondary | ICD-10-CM | POA: Insufficient documentation

## 2023-04-08 MED ORDER — TAPENTADOL HCL 75 MG PO TABS
75.0000 mg | ORAL_TABLET | Freq: Two times a day (BID) | ORAL | 0 refills | Status: AC | PRN
  Filled 2023-05-12: qty 60, 30d supply, fill #0

## 2023-04-08 MED ORDER — TAPENTADOL HCL 75 MG PO TABS
75.0000 mg | ORAL_TABLET | Freq: Two times a day (BID) | ORAL | 0 refills | Status: DC | PRN
  Filled 2023-06-11: qty 60, 30d supply, fill #0

## 2023-04-08 MED ORDER — TAPENTADOL HCL 75 MG PO TABS
75.0000 mg | ORAL_TABLET | Freq: Two times a day (BID) | ORAL | 0 refills | Status: AC | PRN
  Filled 2023-04-12: qty 60, 30d supply, fill #0

## 2023-04-08 NOTE — Progress Notes (Signed)
PROVIDER NOTE: Information contained herein reflects review and annotations entered in association with encounter. Interpretation of such information and data should be left to medically-trained personnel. Information provided to patient can be located elsewhere in the medical record under "Patient Instructions". Document created using STT-dictation technology, any transcriptional errors that may result from process are unintentional.    Patient: Stacey Stanley  Service Category: E/M  Provider: Edward Jolly, MD  DOB: 1976-01-21  DOS: 04/08/2023  Referring Provider: Alba Cory, MD  MRN: [id number]  Specialty: Interventional Pain Management  PCP: Alba Cory, MD  Type: Established Patient  Setting: Ambulatory outpatient    Location: Office  Delivery: Face-to-face     HPI  Stacey Stanley, a 47 y.o. year old female, is here today because of her Chronic pelvic pain in female [R10.2, G89.29]. Stacey Stanley primary complain today is Pelvic Pain Last encounter: My last encounter with her was on 01/11/23 Pertinent problems: Stacey Stanley has Endometriosis; Chronic pelvic pain in female; Cervical radiculitis; Chronic pain syndrome; and Opiate use on their pertinent problem list. Pain Assessment: Severity of Chronic pain is reported as a 6 /10. Location: Rectum  / . Onset: More than a month ago. Quality: Sharp, Burning. Timing: Intermittent. Modifying factor(s): warm water, medications, ice. Vitals:  height is 5\' 5"  (1.651 m) and weight is 186 lb (84.4 kg). Her temporal temperature is 97.8 F (36.6 C). Her blood pressure is 117/83 and her pulse is 79. Her respiration is 16 and oxygen saturation is 99%.   Reason for encounter: medication management.   No change in medical history since last visit.  Patient's pain is at baseline.  Patient continues multimodal pain regimen as prescribed.  States that it provides pain relief and improvement in functional status.   Pharmacotherapy Assessment   Analgesic: Nucynta 75 mg twice daily as needed, quantity 45/month; MME equals 45   Monitoring: [place] PMP: PDMP not reviewed this encounter.       Pharmacotherapy: No side-effects or adverse reactions reported. Compliance: No problems identified. Effectiveness: Clinically acceptable.  Stacey Elk, RN  04/08/2023  1:38 PM  Sign when Signing Visit Nursing Pain Medication Assessment:  Safety precautions to be maintained throughout the outpatient stay will include: orient to surroundings, keep bed in low position, maintain call bell within reach at all times, provide assistance with transfer out of bed and ambulation.  Medication Inspection Compliance: Pill count conducted under aseptic conditions, in front of the patient. Neither the pills nor the bottle was removed from the patient's sight at any time. Once count was completed pills were immediately returned to the patient in their original bottle.  Medication: Tapentadol (Nucynta) Pill/Patch Count:  4 of 60 pills remain Pill/Patch Appearance: Markings consistent with prescribed medication Bottle Appearance: Standard pharmacy container. Clearly labeled. Filled Date: 36 / 12 / 2024 Last Medication intake:  Today Safety precautions to be maintained throughout the outpatient stay will include: orient to surroundings, keep bed in low position, maintain call bell within reach at all times, provide assistance with transfer out of bed and ambulation.     UDS:  Summary  Date Value Ref Range Status  01/12/2023 Note  Final    Comment:    ==================================================================== [employer] 13 (MW) ==================================================================== Test                             Result       Flag  Units  Drug Present and Declared for Prescription Verification   Tapentadol                     >6579        EXPECTED   ng/mg creat    Source of tapentadol is a scheduled prescription  medication.  ==================================================================== Test                      Result    Flag   Units      Ref Range   Creatinine              152              mg/dL      >=16 ==================================================================== Declared Medications:  The flagging and interpretation on this report are based on the  following declared medications.  Unexpected results may arise from  inaccuracies in the declared medications.   **Note: The testing scope of this panel includes these medications:   Tapentadol (Nucynta)   **Note: The testing scope of this panel does not include the  following reported medications:   Acetaminophen (Tylenol)  Aspirin  Buspirone (Buspar)  Clindamycin (Cleocin)  Dapsone  Fish Oil  Hydrocortisone  Hydroxyzine (Vistaril)  Ibuprofen (Advil)  Imiquimod (Aldara)  Meloxicam (Mobic)  Methylprednisolone (Medrol)  Metoprolol (Toprol)  Mupirocin (Bactroban)  Naproxen (Naprosyn)  Omeprazole (Prilosec)  Plecanatide (Trulance)  Semaglutide [facility])  Topical  Tretinoin  Valsartan (Diovan) ==================================================================== For clinical consultation, please call [phone]. ====================================================================      ROS  Constitutional: Denies any fever or chills Gastrointestinal: No reported hemesis, hematochezia, vomiting, or acute GI distress Musculoskeletal: pelvic pain Neurological: No reported episodes of acute onset apraxia, aphasia, dysarthria, agnosia, amnesia, paralysis, loss of coordination, or loss of consciousness  Medication Review  Dapsone, Fish Oil, Fluocin-Hydroquinone-Tretinoin, Plecanatide, Semaglutide-Weight Management, acetaminophen, aspirin, busPIRone, clindamycin, ibuprofen, imiquimod, metoprolol succinate, metoprolol tartrate, mupirocin ointment, rOPINIRole, tapentadol HCl, and valsartan-hydrochlorothiazide  History  Review  Allergy: Stacey Stanley is allergic to nuvigil [armodafinil] and ppd [tuberculin purified protein derivative]. Drug: Stacey Stanley  reports no history of drug use. Alcohol:  reports no history of alcohol use. Tobacco:  reports that she has quit smoking. Her smoking use included cigarettes. She started smoking about 29 years ago. She has a 14.7 pack-year smoking history. She has never used smokeless tobacco. Social: Stacey Stanley  reports that she has quit smoking. Her smoking use included cigarettes. She started smoking about 29 years ago. She has a 14.7 pack-year smoking history. She has never used smokeless tobacco. She reports that she does not drink alcohol and does not use drugs. Medical:  has a past medical history of Anxiety, Arthritis, Chronic insomnia, Depression, Diabetes mellitus without complication (HCC), Dysrhythmia, Endometriosis, GERD (gastroesophageal reflux disease), Gout, Headache(784.0), History of kidney stones, Hypertension, Metabolic syndrome, Obesity, Right thyroid nodule (2017), Serum calcium elevated, Sleep disorder, circadian, shift work type, SVD (spontaneous vaginal delivery), Tachycardia, and TIA (transient ischemic attack). Surgical: Stacey Stanley  has a past surgical history that includes metatarsil  (2003); Dilation and curettage of uterus; Induced abortion; laparoscopy (1992); Eye surgery (06/28/2013); laparoscopy (N/A, 07/19/2013); Hysteroscopy with D & C (N/A, 07/19/2013); Abdominal hysterectomy (05/21/2016); Hemorroidectomy (04/04/2018); Colonoscopy (2019); Colonoscopy with propofol (N/A, 07/04/2021); Esophagogastroduodenoscopy (egd) with propofol (N/A, 07/04/2021); Rectal surgery (08/15/2021); and Hand surgery (Right). Family: family history includes Alzheimer's disease in her paternal grandfather; Diabetes in her mother; Heart disease in her brother; Hypertension in her father and mother;  Kidney disease in her father; Leukemia in her paternal grandmother; Stroke in her  maternal grandmother.  Laboratory Chemistry Profile   Renal Lab Results  Component Value Date   BUN 10 12/31/2022   CREATININE 0.80 12/31/2022   BCR SEE NOTE: 08/13/2022   GFRAA 108 11/28/2020   GFRNONAA >60 12/31/2022    Hepatic Lab Results  Component Value Date   AST 11 08/13/2022   ALT 9 08/13/2022   ALBUMIN 4.1 05/17/2022   ALKPHOS 52 05/17/2022   AMYLASE 102 (H) 05/22/2016   LIPASE 13 04/01/2022    Electrolytes Lab Results  Component Value Date   NA 139 12/31/2022   K 3.5 12/31/2022   CL 102 12/31/2022   CALCIUM 10.4 (H) 12/31/2022   MG 1.7 12/13/2022    Bone Lab Results  Component Value Date   VD25OH 37 08/13/2022    Inflammation (CRP: Acute Phase) (ESR: Chronic Phase) Lab Results  Component Value Date   CRP 0.2 05/06/2020   ESRSEDRATE 2 05/06/2020         Note: Above Lab results reviewed.  Recent Imaging Review  LONG TERM MONITOR (3-14 DAYS) HR 52 - 140, average 83 bpm. 1 nonsustained SVT, longest 4 beats. Rare supraventricular and ventricular ectopy. No sustained arrhythmias. No atrial fibrillation. Symptom trigger episodes correspond to sinus rhythm.  Sheria Lang T. Lalla Brothers, MD, [facility], [facility] Cardiac Electrophysiology Note: Reviewed        Physical Exam  General appearance: Well nourished, well developed, and well hydrated. In no apparent acute distress Mental status: Alert, oriented x 3 (person, place, & time)       Respiratory: No evidence of acute respiratory distress Eyes: PERLA Vitals: BP 117/83   Pulse 79   Temp 97.8 F (36.6 C) (Temporal)   Resp 16   Ht 5\' 5"  (1.651 m)   Wt 186 lb (84.4 kg)   LMP 07/09/2015 (Exact Date)   SpO2 99%   BMI 30.95 kg/m  BMI: Estimated body mass index is 30.95 kg/m as calculated from the following:   Height as of this encounter: 5\' 5"  (1.651 m).   Weight as of this encounter: 186 lb (84.4 kg). Ideal: Ideal body weight: 57 kg (125 lb 10.6 oz) Adjusted ideal body weight: 67.9 kg (149 lb 12.8  oz)   Assessment   Diagnosis Status  1. Chronic pelvic pain in female   2. Chronic pain syndrome   3. Endometriosis   4. Mild major depression (HCC)   5. Long term prescription benzodiazepine use       Persistent Persistent Persistent    Plan of Care    Stacey Stanley has a current medication list which includes the following long-term medication(s): metoprolol succinate, metoprolol tartrate, ropinirole, and valsartan-hydrochlorothiazide.  Pharmacotherapy (Medications Ordered): Meds ordered this encounter  Medications   tapentadol HCl (NUCYNTA) 75 MG tablet    Sig: Take 1 tablet (75 mg total) by mouth 2 (two) times daily as needed for moderate pain (pain score 4-6).    Dispense:  60 tablet    Refill:  0    Chronic pain syndrome   tapentadol HCl (NUCYNTA) 75 MG tablet    Sig: Take 1 tablet (75 mg total) by mouth 2 (two) times daily as needed for moderate pain (pain score 4-6).    Dispense:  60 tablet    Refill:  0    Chronic pain syndrome   tapentadol HCl (NUCYNTA) 75 MG tablet    Sig: Take 1 tablet (75 mg  total) by mouth 2 (two) times daily as needed for moderate pain (pain score 4-6).    Dispense:  60 tablet    Refill:  0    Chronic pain syndrome   Orders:  No orders of the defined types were placed in this encounter.  Follow-up plan:   Return in about 3 months (around 07/09/2023) for MM, F2F.    Recent Visits Date Type Provider Dept  01/11/23 Office Visit Edward Jolly, MD [facility]  Showing recent visits within past 90 days and meeting all other requirements Today's Visits Date Type Provider Dept  04/08/23 Office Visit Edward Jolly, MD [facility]  Showing today's visits and meeting all other requirements Future Appointments No visits were found meeting these conditions. Showing future appointments within next 90 days and meeting all other requirements  I discussed the assessment and treatment plan with the patient. The  patient was provided an opportunity to ask questions and all were answered. The patient agreed with the plan and demonstrated an understanding of the instructions.  Patient advised to call back or seek an in-person evaluation if the symptoms or condition worsens.  Duration of encounter: .  Total time on encounter, as per AMA guidelines included both the face-to-face and non-face-to-face time personally spent by the physician and/or other qualified health care professional(s) on the day of the encounter (includes time in activities that require the physician or other qualified health care professional and does not include time in activities normally performed by clinical staff). Physician's time may include the following activities when performed: preparing to see the patient (eg, review of tests, pre-charting review of records) obtaining and/or reviewing separately obtained history performing a medically appropriate examination and/or evaluation counseling and educating the patient/family/caregiver ordering medications, tests, or procedures referring and communicating with other health care professionals (when not separately reported) documenting clinical information in the electronic or other health record independently interpreting results (not separately reported) and communicating results to the patient/ family/caregiver care coordination (not separately reported)  Note by: Edward Jolly, MD Date: 04/08/2023; Time: 1:57 PM

## 2023-04-08 NOTE — Progress Notes (Signed)
Nursing Pain Medication Assessment:  Safety precautions to be maintained throughout the outpatient stay will include: orient to surroundings, keep bed in low position, maintain call bell within reach at all times, provide assistance with transfer out of bed and ambulation.  Medication Inspection Compliance: Pill count conducted under aseptic conditions, in front of the patient. Neither the pills nor the bottle was removed from the patient's sight at any time. Once count was completed pills were immediately returned to the patient in their original bottle.  Medication: Tapentadol (Nucynta) Pill/Patch Count:  4 of 60 pills remain Pill/Patch Appearance: Markings consistent with prescribed medication Bottle Appearance: Standard pharmacy container. Clearly labeled. Filled Date: 15 / 12 / 2024 Last Medication intake:  Today Safety precautions to be maintained throughout the outpatient stay will include: orient to surroundings, keep bed in low position, maintain call bell within reach at all times, provide assistance with transfer out of bed and ambulation.

## 2023-04-12 ENCOUNTER — Other Ambulatory Visit (HOSPITAL_BASED_OUTPATIENT_CLINIC_OR_DEPARTMENT_OTHER): Payer: Self-pay

## 2023-04-12 ENCOUNTER — Other Ambulatory Visit: Payer: Self-pay

## 2023-04-20 ENCOUNTER — Ambulatory Visit: Payer: Self-pay | Admitting: *Deleted

## 2023-04-20 ENCOUNTER — Ambulatory Visit (HOSPITAL_COMMUNITY): Payer: Federal, State, Local not specified - PPO

## 2023-04-20 NOTE — Telephone Encounter (Signed)
  Chief Complaint: ear pain Symptoms: R ear pain, sore throat Frequency: 4 days Pertinent Negatives: Patient denies fever Disposition: [] ED /[] Urgent Care (no appt availability in office) / [x] Appointment(In office/virtual)/ []  [place] [facility]/ [] Home Care/ [] Refused Recommended Disposition /[] [place] [facility]/ []  Follow-up with PCP Additional Notes: Patient has been scheduled with provider in office - care advice given- patient to call back if she should get worse before the appointment.

## 2023-04-20 NOTE — Telephone Encounter (Signed)
Reason for Disposition  Earache  (Exceptions: brief ear pain of < 60 minutes duration, earache occurring during air travel  Answer Assessment - Initial Assessment Questions 1. LOCATION: "Which ear is involved?"     Right ear 2. ONSET: "When did the ear start hurting"      4 days ago 3. SEVERITY: "How bad is the pain?"  (Scale 1-10; mild, moderate or severe)   - MILD (1-3): doesn't interfere with normal activities    - MODERATE (4-7): interferes with normal activities or awakens from sleep    - SEVERE (8-10): excruciating pain, unable to do any normal activities      5/10 4. URI SYMPTOMS: "Do you have a runny nose or cough?"     Sore throat- R 5. FEVER: "Do you have a fever?" If Yes, ask: "What is your temperature, how was it measured, and when did it start?"     No fever  Protocols used: Davina Poke

## 2023-04-21 ENCOUNTER — Other Ambulatory Visit (HOSPITAL_BASED_OUTPATIENT_CLINIC_OR_DEPARTMENT_OTHER): Payer: Self-pay

## 2023-04-21 ENCOUNTER — Ambulatory Visit: Payer: Federal, State, Local not specified - PPO | Admitting: Internal Medicine

## 2023-04-21 ENCOUNTER — Encounter: Payer: Self-pay | Admitting: Internal Medicine

## 2023-04-21 VITALS — BP 122/86 | HR 87 | Temp 98.1°F | Resp 18 | Ht 65.0 in | Wt 194.2 lb

## 2023-04-21 DIAGNOSIS — H9201 Otalgia, right ear: Secondary | ICD-10-CM | POA: Diagnosis not present

## 2023-04-21 DIAGNOSIS — J358 Other chronic diseases of tonsils and adenoids: Secondary | ICD-10-CM | POA: Diagnosis not present

## 2023-04-21 MED ORDER — NEOMYCIN-POLYMYXIN-HC 1 % OT SOLN
3.0000 [drp] | Freq: Four times a day (QID) | OTIC | 0 refills | Status: DC
  Filled 2023-04-21: qty 10, 17d supply, fill #0

## 2023-04-21 NOTE — Progress Notes (Signed)
Acute Office Visit  Subjective:     Patient ID: Stacey Stanley, female    DOB: 03/28/1976, 47 y.o.   MRN: [id number]  Chief Complaint  Patient presents with   Ear Pain    Right w/ sore throat onset 1 week    HPI Patient is in today for right ear pain and sore throat for 1 week.   Discussed the use of AI scribe software for clinical note transcription with the patient, who gave verbal consent to proceed.  History of Present Illness   The patient, with a history of tonsil stones, presents with right-sided ear pain and sore throat for about a week. They deny fever, changes in hearing, tinnitus, ear drainage, congestion, sinus pain, and headaches. They report a mild runny nose with clear discharge. They have not been around sick individuals and have not taken any over-the-counter cold medicines. They have tried managing the pain with Tylenol and ibuprofen. They also report a sensation of chest tightness and a feeling of neck swelling, particularly on the right side. They deny difficulty swallowing but describe it as uncomfortable. They have attempted to remove the tonsil stones at home with a kit and a water flosser, but have been unsuccessful. They have not been sticking anything in their ear, but have used an ear irrigation device about a month ago to remove wax.       Review of Systems  All other systems reviewed and are negative.       Objective:    BP 122/86   Pulse 87   Temp 98.1 F (36.7 C)   Resp 18   Ht 5\' 5"  (1.651 m)   Wt 194 lb 3.2 oz (88.1 kg)   LMP 07/09/2015 (Exact Date)   SpO2 99%   BMI 32.32 kg/m  BP Readings from Last 3 Encounters:  04/21/23 122/86  04/08/23 117/83  04/02/23 100/78   Wt Readings from Last 3 Encounters:  04/21/23 194 lb 3.2 oz (88.1 kg)  04/08/23 186 lb (84.4 kg)  04/02/23 186 lb 4.8 oz (84.5 kg)      Physical Exam Constitutional:      Appearance: Normal appearance.  HENT:     Head: Normocephalic and atraumatic.     Right  Ear: Ear canal and external ear normal.     Left Ear: Tympanic membrane, ear canal and external ear normal.     Ears:     Comments: Erythema at the 11 and 12 o'clock position on the right TM, no fluid, infection     Nose: Nose normal.     Mouth/Throat:     Mouth: Mucous membranes are moist.     Pharynx: Oropharynx is clear.     Comments: Large right sided tonsil stone, left moderate tonsil stone  Eyes:     Conjunctiva/sclera: Conjunctivae normal.  Cardiovascular:     Rate and Rhythm: Normal rate and regular rhythm.  Pulmonary:     Effort: Pulmonary effort is normal.     Breath sounds: Normal breath sounds.  Musculoskeletal:     Cervical back: Tenderness present.  Lymphadenopathy:     Cervical: Cervical adenopathy present.  Skin:    General: Skin is warm and dry.  Neurological:     General: No focal deficit present.     Mental Status: She is alert. Mental status is at baseline.  Psychiatric:        Mood and Affect: Mood normal.        Behavior: Behavior normal.  No results found for any visits on 04/21/23.      Assessment & Plan:   Assessment and Plan    Right-sided Otitis Media Mild inflammation at the top of the eardrum without signs of infection or fluid. No changes in hearing, tinnitus, or ear drainage. -Prescribe steroid ear drops, if cost-effective for the patient.  Right-sided Tonsillitis Presence of tonsil stones causing discomfort and throat pain. No difficulty swallowing or signs of infection. -Advise gargling with warm salt water regularly. -Continue use of water flosser for tonsil stone removal. -Consider oral steroid pack if symptoms persist or worsen.  General malaise Reports feeling unwell, similar to having a cold. No fever or significant respiratory symptoms. -Advise regular intake of ibuprofen 400-600mg  2-3 times daily for the next few days to reduce inflammation and swelling. -Follow-up if symptoms persist or worsen.       Orders Placed: -  NEOMYCIN-POLYMYXIN-HYDROCORTISONE (CORTISPORIN) 1 % SOLN OTIC solution; Place 3 drops into the right ear 4 (four) times daily.  Dispense: 10 mL; Refill: 0  Return if symptoms worsen or fail to improve.  Margarita Mail, DO

## 2023-04-21 NOTE — Addendum Note (Signed)
Addended by: Burnell Blanks on: 04/21/2023 08:38 AM   Modules accepted: Orders

## 2023-04-22 ENCOUNTER — Telehealth (HOSPITAL_BASED_OUTPATIENT_CLINIC_OR_DEPARTMENT_OTHER): Payer: Self-pay | Admitting: Family

## 2023-04-22 NOTE — Telephone Encounter (Signed)
Called patient to call and schedule a Calcium Scoring test. Informed patient she can call [phone] tp schedule that appointment

## 2023-04-26 DIAGNOSIS — N6452 Nipple discharge: Secondary | ICD-10-CM | POA: Diagnosis not present

## 2023-04-26 LAB — HM MAMMOGRAPHY

## 2023-05-02 ENCOUNTER — Encounter (HOSPITAL_BASED_OUTPATIENT_CLINIC_OR_DEPARTMENT_OTHER): Payer: Self-pay | Admitting: Cardiovascular Disease

## 2023-05-02 DIAGNOSIS — R079 Chest pain, unspecified: Secondary | ICD-10-CM

## 2023-05-02 HISTORY — DX: Chest pain, unspecified: R07.9

## 2023-05-03 ENCOUNTER — Other Ambulatory Visit (HOSPITAL_BASED_OUTPATIENT_CLINIC_OR_DEPARTMENT_OTHER): Payer: Self-pay

## 2023-05-03 DIAGNOSIS — Z01419 Encounter for gynecological examination (general) (routine) without abnormal findings: Secondary | ICD-10-CM | POA: Diagnosis not present

## 2023-05-04 ENCOUNTER — Ambulatory Visit: Payer: Federal, State, Local not specified - PPO | Admitting: Neurology

## 2023-05-05 ENCOUNTER — Ambulatory Visit: Payer: Federal, State, Local not specified - PPO | Admitting: Physician Assistant

## 2023-05-05 ENCOUNTER — Encounter: Payer: Self-pay | Admitting: Physician Assistant

## 2023-05-05 ENCOUNTER — Other Ambulatory Visit (HOSPITAL_BASED_OUTPATIENT_CLINIC_OR_DEPARTMENT_OTHER): Payer: Self-pay

## 2023-05-05 VITALS — BP 130/76 | HR 93 | Resp 16 | Ht 65.0 in | Wt 199.0 lb

## 2023-05-05 DIAGNOSIS — I10 Essential (primary) hypertension: Secondary | ICD-10-CM

## 2023-05-05 DIAGNOSIS — I471 Supraventricular tachycardia, unspecified: Secondary | ICD-10-CM | POA: Diagnosis not present

## 2023-05-05 DIAGNOSIS — E66811 Obesity, class 1: Secondary | ICD-10-CM | POA: Insufficient documentation

## 2023-05-05 DIAGNOSIS — F339 Major depressive disorder, recurrent, unspecified: Secondary | ICD-10-CM | POA: Diagnosis not present

## 2023-05-05 DIAGNOSIS — F411 Generalized anxiety disorder: Secondary | ICD-10-CM | POA: Diagnosis not present

## 2023-05-05 MED ORDER — METOPROLOL SUCCINATE ER 25 MG PO TB24
25.0000 mg | ORAL_TABLET | Freq: Every day | ORAL | 0 refills | Status: DC
  Filled 2023-05-05: qty 90, 90d supply, fill #0

## 2023-05-05 MED ORDER — BUSPIRONE HCL 10 MG PO TABS
10.0000 mg | ORAL_TABLET | Freq: Two times a day (BID) | ORAL | 0 refills | Status: DC
  Filled 2023-05-05: qty 180, 90d supply, fill #0

## 2023-05-05 MED ORDER — VALSARTAN-HYDROCHLOROTHIAZIDE 80-12.5 MG PO TABS
1.0000 | ORAL_TABLET | Freq: Every day | ORAL | 0 refills | Status: DC
  Filled 2023-05-05: qty 90, 90d supply, fill #0

## 2023-05-05 NOTE — Progress Notes (Signed)
Established Patient Office Visit  Name: Stacey Stanley   MRN: [id number]    DOB: [birth year]   Date:05/05/2023  Today's Provider: Jacquelin Hawking, MHS, PA-C Introduced myself to the patient as a PA-C and provided education on APPs in clinical practice.         Subjective  Chief Complaint  Chief Complaint  Patient presents with   Medication Refill    HPI  Hypertension: - Medications: metoprolol 25 mg PO every day, Valsartan-hydrochlorothiazide 80-12.5 mg PO QD - Compliance: good  - Checking BP at home: she checks regularly at home - usually 110s/70s  - Denies any SOB, CP, vision changes, LE edema, medication SEs, or symptoms of hypotension   She reports her mood has been doing okay She denies increased anxiety symptoms or depressed mood since per last visit    Weight Management   She reports she was told by ED provider to stop when she was seen for palpitation and chest pain  She stopped this per recommendations- last dose was likely in Oct  She would like to restart this  She reports nausea when she takes it  She has one remaining test with Cardiology and an apt in Jan with them       05/05/2023    9:55 AM 04/21/2023    8:11 AM 04/08/2023    1:41 PM 02/03/2023   10:36 AM 01/11/2023    1:12 PM  Depression screen PHQ 2/9  Decreased Interest 0 0 0 0 0  Down, Depressed, Hopeless 0 0 0 0 0  PHQ - 2 Score 0 0 0 0 0  Altered sleeping 3 0  0   Tired, decreased energy 0 0  0   Change in appetite 0 0  0   Feeling bad or failure about yourself  0 0  0   Trouble concentrating 0 0  0   Moving slowly or fidgety/restless 0 0  0   Suicidal thoughts 0 0  0   PHQ-9 Score 3 0  0   Difficult doing work/chores  Not difficult at all          11/18/2022    8:45 AM 08/13/2022    9:37 AM 05/15/2022    9:55 AM 11/11/2020    8:41 AM  GAD 7 : Generalized Anxiety Score  Nervous, Anxious, on Edge 0 3 0 0  Control/stop worrying 0 3 0 0  Worry too much - different things 0 3 0 0   Trouble relaxing 0 3 0 0  Restless 0 1 0 0  Easily annoyed or irritable 0 1 0 0  Afraid - awful might happen 0 3 0 0  Total GAD 7 Score 0 17 0 0  Anxiety Difficulty  Extremely difficult        Patient Active Problem List   Diagnosis Date Noted   Obesity (BMI 30.0-34.9) 05/05/2023   Chest pain of uncertain etiology 05/02/2023   History of TIA (transient ischemic attack) 02/05/2022   Hypercalcemia 11/13/2021   Vitamin D deficiency 11/13/2021   Squamous cell carcinoma in situ of skin 08/10/2021   Major depression, recurrent, chronic (HCC) 08/08/2021   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 08/08/2021   Chronic diarrhea of unknown origin    Gastric erosion    Polyp of rectum    Chronic pain syndrome 12/28/2017   Opiate use 12/28/2017   Chronic idiopathic constipation 10/13/2017   Mass of left ovary  06/28/2017   Kidney stone on left side 06/28/2017   Cervical radiculitis 03/08/2017   Vitamin B12 deficiency 08/19/2016   Prediabetes 08/19/2016   Right thyroid nodule 07/31/2015   Chronic pelvic pain in female 04/05/2015   GERD (gastroesophageal reflux disease) 04/05/2015   Insomnia, persistent 11/25/2014   Endometriosis 11/25/2014   Hypertension, benign 11/25/2014    Past Surgical History:  Procedure Laterality Date   ABDOMINAL HYSTERECTOMY  05/21/2016   COLONOSCOPY  2019   COLONOSCOPY WITH PROPOFOL N/A 07/04/2021   Procedure: COLONOSCOPY WITH PROPOFOL;  Surgeon: Toney Reil, MD;  Location: [facility];  Service: Gastroenterology;  Laterality: N/A;   DILATION AND CURETTAGE OF UTERUS     ESOPHAGOGASTRODUODENOSCOPY (EGD) WITH PROPOFOL N/A 07/04/2021   Procedure: ESOPHAGOGASTRODUODENOSCOPY (EGD) WITH PROPOFOL;  Surgeon: Toney Reil, MD;  Location: [facility] ENDOSCOPY;  Service: Gastroenterology;  Laterality: N/A;   EYE SURGERY  06/28/2013   right laser eye surgery - repair retina   HAND SURGERY Right    HEMORROIDECTOMY  04/04/2018   Dr. Brantley Fling     HYSTEROSCOPY WITH D & C N/A 07/19/2013   Procedure: DILATATION AND CURETTAGE Melton Krebs;  Surgeon: Serita Kyle, MD;  Location: [facility] ORS;  Service: Gynecology;  Laterality: N/A;   INDUCED ABORTION     LAPAROSCOPY  1992   endometriosis   LAPAROSCOPY N/A 07/19/2013   Procedure: LAPAROSCOPY DIAGNOSTIC  with resection of endometriosis;  Surgeon: Serita Kyle, MD;  Location: [facility] ORS;  Service: Gynecology;  Laterality: N/A;   metatarsil   2003   left foot surgery    RECTAL SURGERY  08/15/2021    Family History  Problem Relation Age of Onset   Diabetes Mother    Hypertension Mother    Hypertension Father    Kidney disease Father    Stroke Maternal Grandmother    Leukemia Paternal Grandmother    Alzheimer's disease Paternal Grandfather    Heart disease Brother    Anesthesia problems Neg Hx    Hypotension Neg Hx    Malignant hyperthermia Neg Hx    Pseudochol deficiency Neg Hx     Social History   Tobacco Use   Smoking status: Former    Current packs/day: 0.50    Average packs/day: 0.5 packs/day for 29.4 years (14.7 ttl pk-yrs)    Types: Cigarettes    Start date: 11/25/1993   Smokeless tobacco: Never  Substance Use Topics   Alcohol use: No    Alcohol/week: 0.0 standard drinks of alcohol     Current Outpatient Medications:    acetaminophen (TYLENOL) 500 MG tablet, Take 500 mg by mouth every 6 (six) hours as needed for mild pain., Disp: , Rfl:    aspirin 81 MG chewable tablet, Chew 1 tablet (81 mg total) by mouth daily., Disp: 90 tablet, Rfl: 0   clindamycin (CLEOCIN T) 1 % lotion, Apply 1 application to affected area topically every morning., Disp: 60 mL, Rfl: 2   Dapsone 5 % topical gel, Apply 60 g topically daily., Disp: , Rfl:    ibuprofen (ADVIL) 200 MG tablet, Take 200 mg by mouth every 6 (six) hours as needed for mild pain., Disp: , Rfl:    imiquimod (ALDARA) 5 % cream, Apply 1 packet topically daily as needed (warts)., Disp: , Rfl:    mupirocin ointment  (BACTROBAN) 2 %, Apply 1 Application topically to affected area 2 (two) to 3 (three) times daily., Disp: 22 g, Rfl: 1   NEOMYCIN-POLYMYXIN-HYDROCORTISONE (CORTISPORIN) 1 % SOLN OTIC solution, Place  3 drops into the right ear 4 (four) times daily., Disp: 10 mL, Rfl: 0   Omega-3 Fatty Acids (FISH OIL) 1000 MG CAPS, Take 2,000 mg by mouth daily., Disp: , Rfl:    rOPINIRole (REQUIP) 0.25 MG tablet, Take 1 tablet (0.25 mg total) by mouth at bedtime., Disp: 30 tablet, Rfl: 5   tapentadol HCl (NUCYNTA) 75 MG tablet, Take 1 tablet (75 mg total) by mouth 2 (two) times daily as needed for moderate pain (pain score 4-6)., Disp: 60 tablet, Rfl: 0   [START ON 05/12/2023] tapentadol HCl (NUCYNTA) 75 MG tablet, Take 1 tablet (75 mg total) by mouth 2 (two) times daily as needed for moderate pain (pain score 4-6)., Disp: 60 tablet, Rfl: 0   [START ON 06/11/2023] tapentadol HCl (NUCYNTA) 75 MG tablet, Take 1 tablet (75 mg total) by mouth 2 (two) times daily as needed for moderate pain (pain score 4-6)., Disp: 60 tablet, Rfl: 0   TRI-LUMA 0.01-4-0.05 % CREA, Apply 30 mg topically daily., Disp: , Rfl:    TRULANCE 3 MG TABS, Take 1 tablet by mouth daily as needed (constipation)., Disp: , Rfl:    busPIRone (BUSPAR) 10 MG tablet, Take 1 tablet (10 mg total) by mouth 2 (two) times daily., Disp: 180 tablet, Rfl: 0   metoprolol succinate (TOPROL-XL) 25 MG 24 hr tablet, Take 1 tablet (25 mg total) by mouth daily., Disp: 90 tablet, Rfl: 0   valsartan-hydrochlorothiazide (DIOVAN-HCT) 80-12.5 MG tablet, Take 1 tablet by mouth daily., Disp: 90 tablet, Rfl: 0  Allergies  Allergen Reactions   Nuvigil [Armodafinil]     Chest pain    Ppd [Tuberculin Purified Protein Derivative]     I personally reviewed active problem list, medication list, allergies, health maintenance, notes from last encounter, lab results with the patient/caregiver today.   Review of Systems  Eyes:  Negative for blurred vision and double vision.   Respiratory:  Negative for shortness of breath and wheezing.   Cardiovascular:  Negative for chest pain, palpitations and leg swelling.  Neurological:  Positive for headaches. Negative for dizziness and tingling.      Objective  Vitals:   05/05/23 0952  BP: 130/76  Pulse: 93  Resp: 16  SpO2: 99%  Weight: 199 lb (90.3 kg)  Height: 5\' 5"  (1.651 m)    Body mass index is 33.12 kg/m.  Physical Exam Vitals reviewed.  Constitutional:      General: She is awake.     Appearance: Normal appearance. She is well-developed and well-groomed.  HENT:     Head: Normocephalic and atraumatic.  Cardiovascular:     Rate and Rhythm: Normal rate and regular rhythm.     Pulses: Normal pulses.          Radial pulses are 2+ on the right side and 2+ on the left side.     Heart sounds: Normal heart sounds. No murmur heard.    No friction rub. No gallop.  Pulmonary:     Effort: Pulmonary effort is normal.     Breath sounds: Normal breath sounds. No decreased air movement. No decreased breath sounds, wheezing, rhonchi or rales.  Musculoskeletal:     Cervical back: Normal range of motion.     Right lower leg: No edema.     Left lower leg: No edema.  Neurological:     General: No focal deficit present.     Mental Status: She is alert and oriented to person, place, and time. Mental status is at baseline.  GCS: GCS eye subscore is 4. GCS verbal subscore is 5. GCS motor subscore is 6.  Psychiatric:        Attention and Perception: Attention and perception normal.        Mood and Affect: Mood and affect normal.        Speech: Speech normal.        Behavior: Behavior normal. Behavior is cooperative.        Thought Content: Thought content normal.        Cognition and Memory: Cognition normal.      Recent Results (from the past 2160 hour(s))  Ferritin     Status: None   Collection Time: 03/23/23  1:49 PM  Result Value Ref Range   Ferritin 57.3 10.0 - 291.0 ng/mL  TSH     Status: None    Collection Time: 03/23/23  1:49 PM  Result Value Ref Range   TSH 0.86 0.35 - 5.50 uIU/mL     PHQ2/9:    05/05/2023    9:55 AM 04/21/2023    8:11 AM 04/08/2023    1:41 PM 02/03/2023   10:36 AM 01/11/2023    1:12 PM  Depression screen PHQ 2/9  Decreased Interest 0 0 0 0 0  Down, Depressed, Hopeless 0 0 0 0 0  PHQ - 2 Score 0 0 0 0 0  Altered sleeping 3 0  0   Tired, decreased energy 0 0  0   Change in appetite 0 0  0   Feeling bad or failure about yourself  0 0  0   Trouble concentrating 0 0  0   Moving slowly or fidgety/restless 0 0  0   Suicidal thoughts 0 0  0   PHQ-9 Score 3 0  0   Difficult doing work/chores  Not difficult at all         Fall Risk:    05/05/2023    9:55 AM 04/21/2023    8:09 AM 04/08/2023    1:41 PM 03/23/2023    1:11 PM 02/03/2023   10:36 AM  Fall Risk   Falls in the past year? 0 0 0 0 0  Number falls in past yr: 0 0  0 0  Injury with Fall? 0 0  0 0  Risk for fall due to : No Fall Risks    No Fall Risks  Follow up Falls prevention discussed   Falls evaluation completed Falls prevention discussed      Functional Status Survey: Is the patient deaf or have difficulty hearing?: No Does the patient have difficulty seeing, even when wearing glasses/contacts?: No Does the patient have difficulty concentrating, remembering, or making decisions?: No Does the patient have difficulty walking or climbing stairs?: No Does the patient have difficulty dressing or bathing?: No Does the patient have difficulty doing errands alone such as visiting a doctor's office or shopping?: No    Assessment & Plan  Problem List Items Addressed This Visit       Cardiovascular and Mediastinum   Hypertension, benign    Chronic, historic condition  Appears well managed at this time  Currently taking metoprolol 25 mg PO every day, Valsartan-hydrochlorothiazide 80-12.5 mg PO every day Appears to be tolerating well  Checking BP at home and reports avg results are usually  in 110s/70s which is in goal Continue current regimen Follow up in 3 months or sooner if concerns arise        Relevant Medications   valsartan-hydrochlorothiazide (DIOVAN-HCT)  80-12.5 MG tablet   metoprolol succinate (TOPROL-XL) 25 MG 24 hr tablet     Other   Major depression, recurrent, chronic (HCC) - Primary    Chronic, historic condition  Reviewed PHQ9 results during apt today - score of 3 today She reports good management with Buspar 10 mg PO BID  Continue current regimen Refills provided today Follow up in 3 months or sooner if concerns arise        Relevant Medications   busPIRone (BUSPAR) 10 MG tablet   Obesity (BMI 30.0-34.9)    Chronic, historic Ongoing She was previously taking Zepbound but was told to stop by another provider due to concerns for palpitations and chest pain She has further workup planned with Cardiology  She has not been on GLP1 for over month so will need to restart from lowest dose and titrate up- discussed that she should get clearance from Cardiology prior to restarting and she can follow up with PCP for this. Follow up in 3 months or sooner if concerns arise        Other Visit Diagnoses     SVT (supraventricular tachycardia) (HCC)       previously had SVT and was on a BB, she saw Dr. Harl Bowie years ago   Relevant Medications   valsartan-hydrochlorothiazide (DIOVAN-HCT) 80-12.5 MG tablet   metoprolol succinate (TOPROL-XL) 25 MG 24 hr tablet   GAD (generalized anxiety disorder)       Relevant Medications   metoprolol succinate (TOPROL-XL) 25 MG 24 hr tablet   busPIRone (BUSPAR) 10 MG tablet        No follow-ups on file.   I, Shantel Helwig E Tevon Berhane, PA-C, have reviewed all documentation for this visit. The documentation on 05/05/23 for the exam, diagnosis, procedures, and orders are all accurate and complete.   Jacquelin Hawking, MHS, PA-C [facility]

## 2023-05-05 NOTE — Assessment & Plan Note (Signed)
Chronic, historic condition  Reviewed PHQ9 results during apt today - score of 3 today She reports good management with Buspar 10 mg PO BID  Continue current regimen Refills provided today Follow up in 3 months or sooner if concerns arise

## 2023-05-05 NOTE — Assessment & Plan Note (Signed)
Chronic, historic Ongoing She was previously taking Zepbound but was told to stop by another provider due to concerns for palpitations and chest pain She has further workup planned with Cardiology  She has not been on GLP1 for over month so will need to restart from lowest dose and titrate up- discussed that she should get clearance from Cardiology prior to restarting and she can follow up with PCP for this. Follow up in 3 months or sooner if concerns arise

## 2023-05-05 NOTE — Assessment & Plan Note (Signed)
Chronic, historic condition  Appears well managed at this time  Currently taking metoprolol 25 mg PO every day, Valsartan-hydrochlorothiazide 80-12.5 mg PO every day Appears to be tolerating well  Checking BP at home and reports avg results are usually in 110s/70s which is in goal Continue current regimen Follow up in 3 months or sooner if concerns arise

## 2023-05-11 ENCOUNTER — Other Ambulatory Visit (HOSPITAL_BASED_OUTPATIENT_CLINIC_OR_DEPARTMENT_OTHER): Payer: Self-pay

## 2023-05-11 ENCOUNTER — Telehealth: Payer: Self-pay

## 2023-05-11 MED ORDER — ONDANSETRON HCL 4 MG PO TABS: 4.0000 mg | ORAL_TABLET | Freq: Three times a day (TID) | ORAL | 1 refills | Status: DC | PRN

## 2023-05-11 MED ORDER — PROMETHAZINE HCL 25 MG PO TABS: 25.0000 mg | ORAL_TABLET | Freq: Four times a day (QID) | ORAL | 0 refills | Status: DC | PRN

## 2023-05-11 NOTE — Telephone Encounter (Signed)
Last office visit 02/05/2022 IBS with constipation  Last refill Zofran we have never filled for her  Phenergan 25 mg 06/22/2022 0 refills 14 tablets  Has appointment 06/16/2023 with tina   Patient states she has constant nausea and it has been really bad the last 3 days.   If you do both please let me know what strength of zofran and how many

## 2023-05-11 NOTE — Telephone Encounter (Signed)
Pt left a vm requesting a refill on her Zofran and phenergan. Please send to [place], [place].

## 2023-05-11 NOTE — Telephone Encounter (Signed)
Sent medications to pharmacy and called patient and informed patient that it was sent to the pharmacy

## 2023-05-11 NOTE — Addendum Note (Signed)
Addended by: Radene Knee L on: 05/11/2023 04:36 PM   Modules accepted: Orders

## 2023-05-12 ENCOUNTER — Other Ambulatory Visit (HOSPITAL_BASED_OUTPATIENT_CLINIC_OR_DEPARTMENT_OTHER): Payer: Self-pay

## 2023-05-17 DIAGNOSIS — M65331 Trigger finger, right middle finger: Secondary | ICD-10-CM | POA: Diagnosis not present

## 2023-05-18 ENCOUNTER — Ambulatory Visit (HOSPITAL_COMMUNITY): Payer: Federal, State, Local not specified - PPO

## 2023-05-19 ENCOUNTER — Other Ambulatory Visit (HOSPITAL_BASED_OUTPATIENT_CLINIC_OR_DEPARTMENT_OTHER): Payer: Self-pay

## 2023-05-19 ENCOUNTER — Encounter (HOSPITAL_BASED_OUTPATIENT_CLINIC_OR_DEPARTMENT_OTHER): Payer: Self-pay | Admitting: Emergency Medicine

## 2023-05-19 ENCOUNTER — Other Ambulatory Visit (HOSPITAL_COMMUNITY): Payer: Federal, State, Local not specified - PPO

## 2023-05-19 ENCOUNTER — Emergency Department (HOSPITAL_BASED_OUTPATIENT_CLINIC_OR_DEPARTMENT_OTHER): Payer: Federal, State, Local not specified - PPO

## 2023-05-19 ENCOUNTER — Emergency Department (HOSPITAL_BASED_OUTPATIENT_CLINIC_OR_DEPARTMENT_OTHER)
Admission: EM | Admit: 2023-05-19 | Discharge: 2023-05-19 | Disposition: A | Discharge status: Home / Self Care | Payer: Federal, State, Local not specified - PPO | Attending: Emergency Medicine | Admitting: Emergency Medicine

## 2023-05-19 ENCOUNTER — Other Ambulatory Visit: Payer: Self-pay

## 2023-05-19 DIAGNOSIS — R519 Headache, unspecified: Secondary | ICD-10-CM | POA: Insufficient documentation

## 2023-05-19 DIAGNOSIS — Z7982 Long term (current) use of aspirin: Secondary | ICD-10-CM | POA: Insufficient documentation

## 2023-05-19 DIAGNOSIS — M542 Cervicalgia: Secondary | ICD-10-CM | POA: Insufficient documentation

## 2023-05-19 DIAGNOSIS — Z79899 Other long term (current) drug therapy: Secondary | ICD-10-CM | POA: Diagnosis not present

## 2023-05-19 LAB — CBC
HCT: 39.6 % (ref 36.0–46.0)
Hemoglobin: 12.7 g/dL (ref 12.0–15.0)
MCH: 28.2 pg (ref 26.0–34.0)
MCHC: 32.1 g/dL (ref 30.0–36.0)
MCV: 88 fL (ref 80.0–100.0)
Platelets: 263 10*3/uL (ref 150–400)
RBC: 4.5 MIL/uL (ref 3.87–5.11)
RDW: 13.2 % (ref 11.5–15.5)
WBC: 7.4 10*3/uL (ref 4.0–10.5)
nRBC: 0 % (ref 0.0–0.2)

## 2023-05-19 LAB — BASIC METABOLIC PANEL
Anion gap: 7 (ref 5–15)
BUN: 14 mg/dL (ref 6–20)
CO2: 28 mmol/L (ref 22–32)
Calcium: 9.6 mg/dL (ref 8.9–10.3)
Chloride: 106 mmol/L (ref 98–111)
Creatinine, Ser: 0.87 mg/dL (ref 0.44–1.00)
GFR, Estimated: >60 mL/min (ref >60)
Glucose, Bld: 106 mg/dL — ABNORMAL HIGH (ref 70–99)
Potassium: 3.9 mmol/L (ref 3.5–5.1)
Sodium: 141 mmol/L (ref 135–145)

## 2023-05-19 MED ORDER — DIPHENHYDRAMINE HCL 50 MG/ML IJ SOLN
12.5000 mg | Freq: Once | INTRAMUSCULAR | Status: AC
  Administered 2023-05-19: 12.5 mg via INTRAVENOUS
  Filled 2023-05-19: qty 1

## 2023-05-19 MED ORDER — KETOROLAC TROMETHAMINE 15 MG/ML IJ SOLN
15.0000 mg | Freq: Once | INTRAMUSCULAR | Status: AC
  Administered 2023-05-19: 15 mg via INTRAVENOUS
  Filled 2023-05-19: qty 1

## 2023-05-19 MED ORDER — NAPROXEN 375 MG PO TABS
375.0000 mg | ORAL_TABLET | Freq: Two times a day (BID) | ORAL | 0 refills | Status: DC
  Filled 2023-05-19: qty 20, 10d supply, fill #0

## 2023-05-19 MED ORDER — CYCLOBENZAPRINE HCL 10 MG PO TABS
10.0000 mg | ORAL_TABLET | Freq: Two times a day (BID) | ORAL | 0 refills | Status: DC | PRN
Start: 2023-05-19 — End: 2023-07-08
  Filled 2023-05-19: qty 20, 10d supply, fill #0

## 2023-05-19 MED ORDER — PROCHLORPERAZINE EDISYLATE 10 MG/2ML IJ SOLN
10.0000 mg | Freq: Once | INTRAMUSCULAR | Status: AC
Start: 2023-05-19 — End: 2023-05-19
  Administered 2023-05-19: 10 mg via INTRAVENOUS
  Filled 2023-05-19: qty 2

## 2023-05-19 NOTE — Discharge Instructions (Addendum)
The blood tests and CT scan were reassuring.  Take the medications as needed.  Follow-up with your doctor to be rechecked

## 2023-05-19 NOTE — ED Triage Notes (Signed)
Pt c/o HA x 4 days, Reports neck pain that started yesterday. Ibuprofen and tylenol at 0800 today

## 2023-05-19 NOTE — ED Provider Notes (Signed)
[place] EMERGENCY DEPARTMENT AT [facility] Provider Note   CSN: [id number] Arrival date & time: 05/19/23  1914     History  Chief Complaint  Patient presents with   Headache    Stacey Stanley is a 47 y.o. female.   Headache    Patient has a history of chronic pain syndrome, cervical radiculitis, depression who presents to the ED with complaints of a headache.  Patient states she does not regularly have headaches.  She started having pain in the back of her head and neck.  She denies any fevers or chills.  No cough no sore throat.  No vomiting or diarrhea.  Patient tried over-the-counter medications without relief so she came to the ED  Home Medications Prior to Admission medications   Medication Sig Start Date End Date Taking? Authorizing Provider  cyclobenzaprine (FLEXERIL) 10 MG tablet Take 1 tablet (10 mg total) by mouth 2 (two) times daily as needed for muscle spasms. 05/19/23  Yes Linwood Dibbles, MD  naproxen (NAPROSYN) 375 MG tablet Take 1 tablet (375 mg total) by mouth 2 (two) times daily. 05/19/23  Yes Linwood Dibbles, MD  acetaminophen (TYLENOL) 500 MG tablet Take 500 mg by mouth every 6 (six) hours as needed for mild pain.    [provider]  aspirin 81 MG chewable tablet Chew 1 tablet (81 mg total) by mouth daily. 12/12/21   Karie Mainland, Amjad, PA-C  busPIRone (BUSPAR) 10 MG tablet Take 1 tablet (10 mg total) by mouth 2 (two) times daily. 05/05/23   Mecum, Erin E, PA-C  clindamycin (CLEOCIN T) 1 % lotion Apply 1 application to affected area topically every morning. 08/03/22     Dapsone 5 % topical gel Apply 60 g topically daily. 09/21/19   [provider]  ibuprofen (ADVIL) 200 MG tablet Take 200 mg by mouth every 6 (six) hours as needed for mild pain.    [provider]  imiquimod (ALDARA) 5 % cream Apply 1 packet topically daily as needed (warts). 09/21/19   [provider]  metoprolol succinate (TOPROL-XL) 25 MG 24 hr tablet Take 1 tablet  (25 mg total) by mouth daily. 05/05/23   Mecum, Erin E, PA-C  mupirocin ointment (BACTROBAN) 2 % Apply 1 Application topically to affected area 2 (two) to 3 (three) times daily. 08/03/22     NEOMYCIN-POLYMYXIN-HYDROCORTISONE (CORTISPORIN) 1 % SOLN OTIC solution Place 3 drops into the right ear 4 (four) times daily. 04/21/23   Margarita Mail, DO  Omega-3 Fatty Acids (FISH OIL) 1000 MG CAPS Take 2,000 mg by mouth daily.    [provider]  ondansetron (ZOFRAN) 4 MG tablet Take 1 tablet (4 mg total) by mouth every 8 (eight) hours as needed for nausea or vomiting. 05/11/23   Vanga, Loel Dubonnet, MD  promethazine (PHENERGAN) 25 MG tablet Take 1 tablet (25 mg total) by mouth every 6 (six) hours as needed for nausea or vomiting. 05/11/23   Vanga, Loel Dubonnet, MD  rOPINIRole (REQUIP) 0.25 MG tablet Take 1 tablet (0.25 mg total) by mouth at bedtime. 03/23/23   Nita Sickle K, DO  tapentadol HCl (NUCYNTA) 75 MG tablet Take 1 tablet (75 mg total) by mouth 2 (two) times daily as needed for moderate pain (pain score 4-6). 05/12/23 06/11/23  Edward Jolly, MD  tapentadol HCl (NUCYNTA) 75 MG tablet Take 1 tablet (75 mg total) by mouth 2 (two) times daily as needed for moderate pain (pain score 4-6). 06/11/23 07/11/23  Edward Jolly, MD  TRI-LUMA 0.01-4-0.05 % CREA Apply 30 mg topically daily. 09/21/19   [provider]  TRULANCE 3 MG TABS Take 1 tablet by mouth daily as needed (constipation). 09/29/21   [provider]  valsartan-hydrochlorothiazide (DIOVAN-HCT) 80-12.5 MG tablet Take 1 tablet by mouth daily. 05/05/23   Mecum, Erin E, PA-C      Allergies    Nuvigil [armodafinil] and Ppd [tuberculin purified protein derivative]    Review of Systems   Review of Systems  Neurological:  Positive for headaches.    Physical Exam Updated Vital Signs BP 109/76   Pulse 79   Temp 98 F (36.7 C)   Resp 16   Wt 90.7 kg   LMP 07/09/2015 (Exact Date)   SpO2 100%   BMI 33.28 kg/m  Physical  Exam Vitals and nursing note reviewed.  Constitutional:      Appearance: She is well-developed.  HENT:     Head: Normocephalic and atraumatic.     Right Ear: External ear normal.     Left Ear: External ear normal.  Eyes:     General: No scleral icterus.       Right eye: No discharge.        Left eye: No discharge.     Conjunctiva/sclera: Conjunctivae normal.  Neck:     Trachea: No tracheal deviation.  Cardiovascular:     Rate and Rhythm: Normal rate and regular rhythm.  Pulmonary:     Effort: Pulmonary effort is normal. No respiratory distress.     Breath sounds: Normal breath sounds. No stridor. No wheezing or rales.  Abdominal:     General: Bowel sounds are normal. There is no distension.     Palpations: Abdomen is soft.     Tenderness: There is no abdominal tenderness. There is no guarding or rebound.  Musculoskeletal:        General: No tenderness or deformity.     Cervical back: Normal range of motion and neck supple. No rigidity.  Skin:    General: Skin is warm and dry.     Findings: No rash.  Neurological:     General: No focal deficit present.     Mental Status: She is alert.     Cranial Nerves: No cranial nerve deficit, dysarthria or facial asymmetry.     Sensory: No sensory deficit.     Motor: No abnormal muscle tone or seizure activity.     Coordination: Coordination normal.  Psychiatric:        Mood and Affect: Mood normal.     ED Results / Procedures / Treatments   Labs (all labs ordered are listed, but only abnormal results are displayed) Labs Reviewed  BASIC METABOLIC PANEL - Abnormal; Notable for the following components:      Result Value   Glucose, Bld 106 (*)    All other components within normal limits  CBC    EKG None  Radiology CT Head Wo Contrast Result Date: 05/19/2023 CLINICAL DATA:  Worsening headache for 4 days. EXAM: CT HEAD WITHOUT CONTRAST TECHNIQUE: Contiguous axial images were obtained from the base of the skull through the  vertex without intravenous contrast. RADIATION DOSE REDUCTION: This exam was performed according to the departmental dose-optimization program which includes automated exposure control, adjustment of the mA and/or kV according to patient size and/or use of iterative reconstruction technique. COMPARISON:  05/17/2022 FINDINGS: Brain: There is no evidence for acute hemorrhage, hydrocephalus, mass lesion, or abnormal extra-axial fluid collection. No definite CT evidence for acute  infarction. Vascular: No hyperdense vessel or unexpected calcification. Skull: No evidence for fracture. No worrisome lytic or sclerotic lesion. Sinuses/Orbits: The visualized paranasal sinuses and mastoid air cells are clear. Visualized portions of the globes and intraorbital fat are unremarkable. Other: None. IMPRESSION: No acute intracranial abnormality. Electronically Signed   By: Kennith Center M.D.   On: 05/19/2023 12:12    Procedures Procedures    Medications Ordered in ED Medications  ketorolac (TORADOL) 15 MG/ML injection 15 mg (15 mg Intravenous Given 05/19/23 1047)  prochlorperazine (COMPAZINE) injection 10 mg (10 mg Intravenous Given 05/19/23 1048)  diphenhydrAMINE (BENADRYL) injection 12.5 mg (12.5 mg Intravenous Given 05/19/23 1048)    ED Course/ Medical Decision Making/ A&P Clinical Course as of 05/19/23 1227  Wed May 19, 2023  1144 CBC metabolic panel normal [JK]  1218 Head CT without acute abnormality [JK]  1222 Pt is felling better.  Headache has resolved  [JK]    Clinical Course User Index [JK] Linwood Dibbles, MD                                 Medical Decision Making Problems Addressed: Nonintractable headache, unspecified chronicity pattern, unspecified headache type: acute illness or injury that poses a threat to life or bodily functions  Amount and/or Complexity of Data Reviewed Labs: ordered. Decision-making details documented in ED Course. Radiology: ordered and independent interpretation  performed.  Risk Prescription drug management.   Presented ED with complaints of headache.  ED workup reassuring.  I have low suspicion for meningitis at this time.  She is not having any infectious symptoms.  She has good range of motion her neck without signs of meningismus.  CT does not show any signs of hemorrhage or other acute abnormality.  Patient symptoms have improved with treatment in ED.  Symptoms may be possible tension or migraine type headache  Evaluation and diagnostic testing in the emergency department does not suggest an emergent condition requiring admission or immediate intervention beyond what has been performed at this time.  The patient is safe for discharge and has been instructed to return immediately for worsening symptoms, change in symptoms or any other concerns.        Final Clinical Impression(s) / ED Diagnoses Final diagnoses:  Nonintractable headache, unspecified chronicity pattern, unspecified headache type    Rx / DC Orders ED Discharge Orders          Ordered    naproxen (NAPROSYN) 375 MG tablet  2 times daily        05/19/23 1226    cyclobenzaprine (FLEXERIL) 10 MG tablet  2 times daily PRN        05/19/23 1226              Linwood Dibbles, MD 05/19/23 1228

## 2023-05-19 NOTE — ED Notes (Signed)
Called pt to make aware that earrings left behind. LMVM

## 2023-06-04 ENCOUNTER — Telehealth: Payer: Self-pay | Admitting: *Deleted

## 2023-06-04 NOTE — Progress Notes (Signed)
 Transition Care Management Unsuccessful Follow-up Telephone Call  Date of discharge and from where:  [facility] 05/19/2023  Attempts:  1st Attempt  Reason for unsuccessful TCM follow-up call:  Left voice message

## 2023-06-07 ENCOUNTER — Telehealth: Payer: Self-pay | Admitting: *Deleted

## 2023-06-07 NOTE — Progress Notes (Signed)
 Transition Care Management Unsuccessful Follow-up Telephone Call  Date of discharge and from where:  [facility]  05/19/2023  Attempts:  2nd Attempt  Reason for unsuccessful TCM follow-up call:  No answer/busy

## 2023-06-09 ENCOUNTER — Ambulatory Visit (HOSPITAL_COMMUNITY): Payer: Federal, State, Local not specified - PPO

## 2023-06-10 ENCOUNTER — Other Ambulatory Visit (HOSPITAL_BASED_OUTPATIENT_CLINIC_OR_DEPARTMENT_OTHER): Payer: Self-pay

## 2023-06-10 ENCOUNTER — Other Ambulatory Visit: Payer: Self-pay | Admitting: Student in an Organized Health Care Education/Training Program

## 2023-06-10 DIAGNOSIS — G894 Chronic pain syndrome: Secondary | ICD-10-CM

## 2023-06-11 ENCOUNTER — Other Ambulatory Visit (HOSPITAL_BASED_OUTPATIENT_CLINIC_OR_DEPARTMENT_OTHER): Payer: Self-pay

## 2023-06-13 ENCOUNTER — Encounter: Payer: Self-pay | Admitting: Student in an Organized Health Care Education/Training Program

## 2023-06-15 NOTE — Progress Notes (Signed)
 Brigitte Canard, PA-C [address]  [address]  [place], [place] [address]  Main: [phone]  Fax: [phone]   Primary Care Physician: Arleen Lacer, MD  Primary Gastroenterologist:  Brigitte Canard, PA-C / Dr. Ellis Guys    CC: IBS, constipation, nausea  HPI: Stacey Stanley is a 48 y.o. female, presents for f/u of irritable bowel syndrome with chronic constipation, generalized abdominal pain, and nausea.  Current symptoms: She is having severe constipation with generalized periumbilical and LUQ abdominal pain and bloating.  Last bowel movement 1 week ago.  Denies rectal bleeding, weight loss, or vomiting.  Has moderate nausea.  She takes Zofran  and Phenergan  as needed for nausea and needs refill.  Also takes Trulance  3 mg once daily plus Amitiza  24 mcg twice daily which works well and needs refill on medications.  She cannot take this treatment every day because it causes diarrhea which inhibits her work/job.  She has tried multiple other treatments in the past including Linzess  (caused cramping), OTC laxatives (caused cramping) lactulose (caused increased gas).  She was on amitriptyline  in the past which helped her IBS and abdominal pain.  She would like to try that again.  Last saw Dr. Baldomero Bone 01/2022 for chronic diarrhea.  Since 07/2020, she has had episodes of watery diarrhea, epigastric pain, RUQ quadrant pain, constipation and nausea.  History of chronic pelvic pain and endometriosis.  Denies alcohol, tobacco or NSAID use.  Has moderate stress.  Underwent cholecystectomy 11/2020 for cholelithiasis.  Had worsening diarrhea after cholecystectomy.  Tried cholestyramine powder 3 times daily which caused constipation.  Underwent hemorrhoidectomy in 2022.    CT abdomen and pelvis 04/2022: no acute intra-abdominal pathology.  Previous hysterectomy.  Lab 05/2023: Normal CBC, BMP, TSH.  07/2021 EGD: Erosive gastropathy, otherwise normal.  Biopsies negative for H. pylori and  dysplasia.  07/2021 colonoscopy: Fair prep.  4 small (4 mm to 5 mm) hyperplastic rectal polyps removed.  Biopsies negative for microscopic colitis.     Current Outpatient Medications  Medication Sig Dispense Refill   acetaminophen  (TYLENOL ) 500 MG tablet Take 500 mg by mouth every 6 (six) hours as needed for mild pain.     amitriptyline  (ELAVIL ) 10 MG tablet Take 1 tablet (10 mg total) by mouth at bedtime. 30 tablet 3   aspirin  81 MG chewable tablet Chew 1 tablet (81 mg total) by mouth daily. 90 tablet 0   busPIRone  (BUSPAR ) 10 MG tablet Take 1 tablet (10 mg total) by mouth 2 (two) times daily. 180 tablet 0   clindamycin  (CLEOCIN  T) 1 % lotion Apply 1 application to affected area topically every morning. 60 mL 2   cyclobenzaprine  (FLEXERIL ) 10 MG tablet Take 1 tablet (10 mg total) by mouth 2 (two) times daily as needed for muscle spasms. 20 tablet 0   Dapsone 5 % topical gel Apply 60 g topically daily.     ibuprofen  (ADVIL ) 200 MG tablet Take 200 mg by mouth every 6 (six) hours as needed for mild pain.     imiquimod (ALDARA) 5 % cream Apply 1 packet topically daily as needed (warts).     lubiprostone  (AMITIZA ) 24 MCG capsule Take 1 capsule (24 mcg total) by mouth 2 (two) times daily with a meal. 60 capsule 11   metoprolol  succinate (TOPROL -XL) 25 MG 24 hr tablet Take 1 tablet (25 mg total) by mouth daily. 90 tablet 0   mupirocin  ointment (BACTROBAN ) 2 % Apply 1 Application topically to affected area 2 (two) to  3 (three) times daily. 22 g 1   naproxen  (NAPROSYN ) 375 MG tablet Take 1 tablet (375 mg total) by mouth 2 (two) times daily. 20 tablet 0   NEOMYCIN -POLYMYXIN-HYDROCORTISONE (CORTISPORIN ) 1 % SOLN OTIC solution Place 3 drops into the right ear 4 (four) times daily. 10 mL 0   Omega-3 Fatty Acids (FISH OIL) 1000 MG CAPS Take 2,000 mg by mouth daily.     polyethylene glycol (GOLYTELY ) 236 g solution Take 4,000 mLs by mouth once for 1 dose. 4000 mL 0   promethazine  (PHENERGAN ) 25 MG tablet  Take 1 tablet (25 mg total) by mouth every 6 (six) hours as needed for nausea or vomiting. 14 tablet 0   rOPINIRole  (REQUIP ) 0.25 MG tablet Take 1 tablet (0.25 mg total) by mouth at bedtime. 30 tablet 5   tapentadol  HCl (NUCYNTA ) 75 MG tablet Take 1 tablet (75 mg total) by mouth 2 (two) times daily as needed for moderate pain (pain score 4-6). 60 tablet 0   TRI-LUMA 0.01-4-0.05 % CREA Apply 30 mg topically daily.     valsartan -hydrochlorothiazide  (DIOVAN -HCT) 80-12.5 MG tablet Take 1 tablet by mouth daily. 90 tablet 0   ondansetron  (ZOFRAN ) 4 MG tablet Take 1 tablet (4 mg total) by mouth every 8 (eight) hours as needed for nausea or vomiting. 30 tablet 1   TRULANCE  3 MG TABS Take 1 tablet (3 mg total) by mouth daily as needed (constipation). 30 tablet 11   No current facility-administered medications for this visit.    Allergies as of 06/16/2023 - Review Complete 06/16/2023  Allergen Reaction Noted   Nuvigil  [armodafinil ]  07/27/2017   Ppd [tuberculin purified protein derivative]  12/13/2020    Past Medical History:  Diagnosis Date   Anxiety    no current meds   Arthritis    left knee pain - otc med prn   Chest pain of uncertain etiology 05/02/2023   Chronic insomnia    Depression    Diabetes mellitus without complication (HCC)    Dysrhythmia    Home with daughter Mae Schlossman   Endometriosis    GERD (gastroesophageal reflux disease)    diet control - no meds   Gout    wrist, feet, hands bilateral   Headache(784.0)    hx - last one 2 yrs ago - no meds   History of kidney stones    Hypertension    Metabolic syndrome    Obesity    Right thyroid  nodule 2017   benign   Serum calcium elevated    Sleep disorder, circadian, shift work type    SVD (spontaneous vaginal delivery)    x 2   Tachycardia    History - neg cardiac tests-stress related   TIA (transient ischemic attack)     Past Surgical History:  Procedure Laterality Date   ABDOMINAL HYSTERECTOMY  05/21/2016    COLONOSCOPY  2019   COLONOSCOPY WITH PROPOFOL  N/A 07/04/2021   Procedure: COLONOSCOPY WITH PROPOFOL ;  Surgeon: Selena Daily, MD;  Location: [facility];  Service: Gastroenterology;  Laterality: N/A;   DILATION AND CURETTAGE OF UTERUS     ESOPHAGOGASTRODUODENOSCOPY (EGD) WITH PROPOFOL  N/A 07/04/2021   Procedure: ESOPHAGOGASTRODUODENOSCOPY (EGD) WITH PROPOFOL ;  Surgeon: Selena Daily, MD;  Location: [facility];  Service: Gastroenterology;  Laterality: N/A;   EYE SURGERY  06/28/2013   right laser eye surgery - repair retina   HAND SURGERY Right    HEMORROIDECTOMY  04/04/2018   Dr. Arta Bihari    HYSTEROSCOPY WITH D &  C N/A 07/19/2013   Procedure: DILATATION AND CURETTAGE /HYSTEROSCOPY;  Surgeon: Kandra Orn, MD;  Location: [facility];  Service: Gynecology;  Laterality: N/A;   INDUCED ABORTION     LAPAROSCOPY  1992   endometriosis   LAPAROSCOPY N/A 07/19/2013   Procedure: LAPAROSCOPY DIAGNOSTIC  with resection of endometriosis;  Surgeon: Kandra Orn, MD;  Location: [facility];  Service: Gynecology;  Laterality: N/A;   metatarsil   2003   left foot surgery    RECTAL SURGERY  08/15/2021    Review of Systems:    All systems reviewed and negative except where noted in HPI.   Physical Examination:   BP 111/71   Pulse 85   Temp 98.2 F (36.8 C)   Ht 5\' 5"  (1.651 m)   Wt 210 lb 9.6 oz (95.5 kg)   LMP 07/09/2015 (Exact Date)   BMI 35.05 kg/m   General: Well-nourished, well-developed in no acute distress.  Lungs: Clear to auscultation bilaterally. Non-labored. Heart: Regular rate and rhythm, no murmurs rubs or gallops.  Abdomen: Bowel sounds are normal; Abdomen is Soft; No hepatosplenomegaly, masses or hernias;  No Abdominal Tenderness; No guarding or rebound tenderness. Neuro: Alert and oriented x 3.  Grossly intact.  Psych: Alert and cooperative, normal mood and affect.   Imaging Studies: CT Head Wo Contrast Result Date: 05/19/2023 CLINICAL DATA:   Worsening headache for 4 days. EXAM: CT HEAD WITHOUT CONTRAST TECHNIQUE: Contiguous axial images were obtained from the base of the skull through the vertex without intravenous contrast. RADIATION DOSE REDUCTION: This exam was performed according to the departmental dose-optimization program which includes automated exposure control, adjustment of the mA and/or kV according to patient size and/or use of iterative reconstruction technique. COMPARISON:  05/17/2022 FINDINGS: Brain: There is no evidence for acute hemorrhage, hydrocephalus, mass lesion, or abnormal extra-axial fluid collection. No definite CT evidence for acute infarction. Vascular: No hyperdense vessel or unexpected calcification. Skull: No evidence for fracture. No worrisome lytic or sclerotic lesion. Sinuses/Orbits: The visualized paranasal sinuses and mastoid air cells are clear. Visualized portions of the globes and intraorbital fat are unremarkable. Other: None. IMPRESSION: No acute intracranial abnormality. Electronically Signed   By: Donnal Fusi M.D.   On: 05/19/2023 12:12    Assessment and Plan:   CIYANNA OSTERLOH is a 48 y.o. y/o female returns for F/U:  Irritable Bowel Syndrome, Constipation Predominant Refilled Trulance  3 Mg 1 capsule daily, #30, 11 refills. Rx Amitiza  24 micrograms 1 capsule twice daily, #60, 11 refills. Rx GoLytely  Colon Purge today. Recommend high-fiber diet with fruits, vegetables, whole grains. Drink 64 ounces of water  fluids daily. I offered trial of Ibsrela samples and patient declined.  2.  Chronic Nausea  Refilled Zofran  4mg  BID prn.  3.  Generalized abdominal pain due to irritable bowel syndrome  Rx amitriptyline  10mg  1 tablet at bedtime, #30, 3 RF.  Brigitte Canard, PA-C  Follow up 3 months with Dr. Baldomero Bone

## 2023-06-16 ENCOUNTER — Other Ambulatory Visit: Payer: Self-pay

## 2023-06-16 ENCOUNTER — Ambulatory Visit: Payer: Federal, State, Local not specified - PPO | Admitting: Physician Assistant

## 2023-06-16 ENCOUNTER — Other Ambulatory Visit (HOSPITAL_BASED_OUTPATIENT_CLINIC_OR_DEPARTMENT_OTHER): Payer: Self-pay

## 2023-06-16 ENCOUNTER — Encounter: Payer: Self-pay | Admitting: Physician Assistant

## 2023-06-16 VITALS — BP 111/71 | HR 85 | Temp 98.2°F | Ht 65.0 in | Wt 210.6 lb

## 2023-06-16 DIAGNOSIS — K581 Irritable bowel syndrome with constipation: Secondary | ICD-10-CM

## 2023-06-16 DIAGNOSIS — R110 Nausea: Secondary | ICD-10-CM | POA: Diagnosis not present

## 2023-06-16 DIAGNOSIS — R140 Abdominal distension (gaseous): Secondary | ICD-10-CM

## 2023-06-16 MED ORDER — ONDANSETRON HCL 4 MG PO TABS
4.0000 mg | ORAL_TABLET | Freq: Three times a day (TID) | ORAL | 1 refills | Status: AC | PRN
  Filled 2023-06-16: qty 30, 10d supply, fill #0
  Filled 2023-09-01: qty 30, 10d supply, fill #1

## 2023-06-16 MED ORDER — PEG 3350-KCL-NABCB-NACL-NASULF 236 G PO SOLR
4000.0000 mL | Freq: Once | ORAL | 0 refills | Status: AC
  Filled 2023-06-16: qty 4000, 1d supply, fill #0

## 2023-06-16 MED ORDER — TRULANCE 3 MG PO TABS
1.0000 | ORAL_TABLET | Freq: Every day | ORAL | 11 refills | Status: DC | PRN
  Filled 2023-06-16: qty 30, 30d supply, fill #0

## 2023-06-16 MED ORDER — LUBIPROSTONE 24 MCG PO CAPS
24.0000 ug | ORAL_CAPSULE | Freq: Two times a day (BID) | ORAL | 11 refills | Status: DC
  Filled 2023-06-16: qty 60, 30d supply, fill #0

## 2023-06-16 MED ORDER — AMITRIPTYLINE HCL 10 MG PO TABS
10.0000 mg | ORAL_TABLET | Freq: Every day | ORAL | 3 refills | Status: DC
  Filled 2023-06-16 – 2023-09-01 (×2): qty 30, 30d supply, fill #0

## 2023-06-21 ENCOUNTER — Ambulatory Visit (HOSPITAL_BASED_OUTPATIENT_CLINIC_OR_DEPARTMENT_OTHER): Payer: Federal, State, Local not specified - PPO | Admitting: Family

## 2023-06-23 ENCOUNTER — Encounter (HOSPITAL_BASED_OUTPATIENT_CLINIC_OR_DEPARTMENT_OTHER): Payer: Self-pay | Admitting: Family

## 2023-06-24 ENCOUNTER — Other Ambulatory Visit (HOSPITAL_BASED_OUTPATIENT_CLINIC_OR_DEPARTMENT_OTHER): Payer: Self-pay

## 2023-06-28 ENCOUNTER — Other Ambulatory Visit (HOSPITAL_BASED_OUTPATIENT_CLINIC_OR_DEPARTMENT_OTHER): Payer: Self-pay

## 2023-07-06 ENCOUNTER — Encounter: Payer: Self-pay | Admitting: Physician Assistant

## 2023-07-06 ENCOUNTER — Telehealth: Payer: Self-pay

## 2023-07-06 DIAGNOSIS — G8929 Other chronic pain: Secondary | ICD-10-CM

## 2023-07-06 NOTE — Telephone Encounter (Signed)
Called and offered her appointment today at 1:30 and she states she has no one to drive her today. She took appointment for tomorrow and she will go get lab work today

## 2023-07-06 NOTE — Progress Notes (Deleted)
 Ellouise Console, PA-C [address]  [address]  [place], [place] [address]  Main: [phone]  Fax: [phone]   Primary Care Physician: Glenard Mire, MD  Primary Gastroenterologist:  Ellouise Console, PA-C / Dr. Corinn Brooklyn    CC: Epigastric pain  HPI: Stacey Stanley is a 48 y.o. female, estab. Pt. Of Dr. Brooklyn, presents for flare-up of epigastric pain.  She has history of chronic epigastric pain.  Labs were ordered yesterday: CBC, CMP, Lipase.  Pt. Has not had labs drawn yet.  Not currently taking a PPI or H2 RB.  NSAIDs?  I saw patient 2 weeks ago 06/16/2023 to evaluate severe constipation, generalized abdominal pain and bloating.  I refilled her Trulance  3 mg daily and Amitiza  24 mcg twice daily.  I also gave her GoLytely  colon purge.  Also started her on amitriptyline  10 Mg 1 tablet nightly for IBS/abdominal pain.  Refilled Zofran  as needed nausea.  Since 07/2020, she has had episodes of watery diarrhea, epigastric pain, RUQ quadrant pain, constipation and nausea.  History of chronic pelvic pain and endometriosis.  Denies alcohol, tobacco or NSAID use.  Has moderate stress.   Underwent cholecystectomy 11/2020 for cholelithiasis.  Had worsening diarrhea after cholecystectomy.  Tried cholestyramine powder 3 times daily which caused constipation.  Underwent hemorrhoidectomy in 2022.     CT abdomen and pelvis 04/2022: no acute intra-abdominal pathology.  Previous hysterectomy.  Lab 05/2023: Normal CBC, BMP, TSH.   07/2021 EGD: Erosive gastropathy, otherwise normal.  Biopsies negative for H. pylori and dysplasia.   07/2021 colonoscopy: Fair prep.  4 small (4 mm to 5 mm) hyperplastic rectal polyps removed.  Biopsies negative for microscopic colitis.  Current Outpatient Medications  Medication Sig Dispense Refill   acetaminophen  (TYLENOL ) 500 MG tablet Take 500 mg by mouth every 6 (six) hours as needed for mild pain.     amitriptyline  (ELAVIL ) 10 MG tablet Take 1 tablet (10 mg  total) by mouth at bedtime. 30 tablet 3   aspirin  81 MG chewable tablet Chew 1 tablet (81 mg total) by mouth daily. 90 tablet 0   busPIRone  (BUSPAR ) 10 MG tablet Take 1 tablet (10 mg total) by mouth 2 (two) times daily. 180 tablet 0   clindamycin  (CLEOCIN  T) 1 % lotion Apply 1 application to affected area topically every morning. 60 mL 2   cyclobenzaprine  (FLEXERIL ) 10 MG tablet Take 1 tablet (10 mg total) by mouth 2 (two) times daily as needed for muscle spasms. 20 tablet 0   Dapsone 5 % topical gel Apply 60 g topically daily.     ibuprofen  (ADVIL ) 200 MG tablet Take 200 mg by mouth every 6 (six) hours as needed for mild pain.     imiquimod (ALDARA) 5 % cream Apply 1 packet topically daily as needed (warts).     lubiprostone  (AMITIZA ) 24 MCG capsule Take 1 capsule (24 mcg total) by mouth 2 (two) times daily with a meal. 60 capsule 11   metoprolol  succinate (TOPROL -XL) 25 MG 24 hr tablet Take 1 tablet (25 mg total) by mouth daily. 90 tablet 0   mupirocin  ointment (BACTROBAN ) 2 % Apply 1 Application topically to affected area 2 (two) to 3 (three) times daily. 22 g 1   naproxen  (NAPROSYN ) 375 MG tablet Take 1 tablet (375 mg total) by mouth 2 (two) times daily. 20 tablet 0   NEOMYCIN -POLYMYXIN-HYDROCORTISONE (CORTISPORIN ) 1 % SOLN OTIC solution Place 3 drops into the right ear 4 (four) times daily. 10 mL  0   Omega-3 Fatty Acids (FISH OIL) 1000 MG CAPS Take 2,000 mg by mouth daily.     ondansetron  (ZOFRAN ) 4 MG tablet Take 1 tablet (4 mg total) by mouth every 8 (eight) hours as needed for nausea or vomiting. 30 tablet 1   promethazine  (PHENERGAN ) 25 MG tablet Take 1 tablet (25 mg total) by mouth every 6 (six) hours as needed for nausea or vomiting. 14 tablet 0   rOPINIRole  (REQUIP ) 0.25 MG tablet Take 1 tablet (0.25 mg total) by mouth at bedtime. 30 tablet 5   tapentadol  HCl (NUCYNTA ) 75 MG tablet Take 1 tablet (75 mg total) by mouth 2 (two) times daily as needed for moderate pain (pain score 4-6). 60  tablet 0   TRI-LUMA 0.01-4-0.05 % CREA Apply 30 mg topically daily.     TRULANCE  3 MG TABS Take 1 tablet (3 mg total) by mouth daily as needed (constipation). 30 tablet 11   valsartan -hydrochlorothiazide  (DIOVAN -HCT) 80-12.5 MG tablet Take 1 tablet by mouth daily. 90 tablet 0   No current facility-administered medications for this visit.    Allergies as of 07/07/2023 - Review Complete 06/16/2023  Allergen Reaction Noted   Nuvigil  [armodafinil ]  07/27/2017   Ppd [tuberculin purified protein derivative]  12/13/2020    Past Medical History:  Diagnosis Date   Anxiety    no current meds   Arthritis    left knee pain - otc med prn   Chest pain of uncertain etiology 05/02/2023   Chronic insomnia    Depression    Diabetes mellitus without complication (HCC)    Dysrhythmia    Home with daughter Stacey Stanley   Endometriosis    GERD (gastroesophageal reflux disease)    diet control - no meds   Gout    wrist, feet, hands bilateral   Headache(784.0)    hx - last one 2 yrs ago - no meds   History of kidney stones    Hypertension    Metabolic syndrome    Obesity    Right thyroid  nodule 2017   benign   Serum calcium elevated    Sleep disorder, circadian, shift work type    SVD (spontaneous vaginal delivery)    x 2   Tachycardia    History - neg cardiac tests-stress related   TIA (transient ischemic attack)     Past Surgical History:  Procedure Laterality Date   ABDOMINAL HYSTERECTOMY  05/21/2016   COLONOSCOPY  2019   COLONOSCOPY WITH PROPOFOL  N/A 07/04/2021   Procedure: COLONOSCOPY WITH PROPOFOL ;  Surgeon: Unk Corinn Skiff, MD;  Location: [facility];  Service: Gastroenterology;  Laterality: N/A;   DILATION AND CURETTAGE OF UTERUS     ESOPHAGOGASTRODUODENOSCOPY (EGD) WITH PROPOFOL  N/A 07/04/2021   Procedure: ESOPHAGOGASTRODUODENOSCOPY (EGD) WITH PROPOFOL ;  Surgeon: Unk Corinn Skiff, MD;  Location: [facility];  Service: Gastroenterology;  Laterality: N/A;   EYE  SURGERY  06/28/2013   right laser eye surgery - repair retina   HAND SURGERY Right    HEMORROIDECTOMY  04/04/2018   Dr. Karleen    HYSTEROSCOPY WITH D & C N/A 07/19/2013   Procedure: DILATATION AND CURETTAGE LELDON;  Surgeon: Dickie DELENA Carder, MD;  Location: [facility];  Service: Gynecology;  Laterality: N/A;   INDUCED ABORTION     LAPAROSCOPY  1992   endometriosis   LAPAROSCOPY N/A 07/19/2013   Procedure: LAPAROSCOPY DIAGNOSTIC  with resection of endometriosis;  Surgeon: Dickie DELENA Carder, MD;  Location: [facility];  Service: Gynecology;  Laterality: N/A;  metatarsil   2003   left foot surgery    RECTAL SURGERY  08/15/2021    Review of Systems:    All systems reviewed and negative except where noted in HPI.   Physical Examination:   LMP 07/09/2015 (Exact Date)   General: Well-nourished, well-developed in no acute distress.  Lungs: Clear to auscultation bilaterally. Non-labored. Heart: Regular rate and rhythm, no murmurs rubs or gallops.  Abdomen: Bowel sounds are normal; Abdomen is Soft; No hepatosplenomegaly, masses or hernias;  No Abdominal Tenderness; No guarding or rebound tenderness. Neuro: Alert and oriented x 3.  Grossly intact.  Psych: Alert and cooperative, normal mood and affect.   Imaging Studies: No results found.  Assessment and Plan:   BILAN TEDESCO is a 48 y.o. y/o female   1.  Epigastric pain; history of erosive gastritis on EGD in 2023.  Avoid NSAIDs  Start pantoprazole  40 Mg 1 tablet once daily  Start Carafate 1 g 3 times daily  Previous cholecystectomy  Abdominal CT if labs are abnormal  Repeat EGD if anemic  2.  Chronic constipation  Continue Trulance  3 mg daily  Continue Amitiza  24 mcg twice daily  3   Irritable bowel syndrome, constipation predominant  Continue amitriptyline  10 Mg nightly    Ellouise Console, PA-C  Follow up ***  BP check ***

## 2023-07-06 NOTE — Telephone Encounter (Signed)
Call patient and tell her I can order lab work: CBC, CMP, lipase.  Diagnosis epigastric pain.  Also see if I have a work in spot available to see her this week.  Okay to use 11:15 AM or 3:30 PM  Celso Amy, PA-C

## 2023-07-07 ENCOUNTER — Ambulatory Visit: Payer: Federal, State, Local not specified - PPO | Admitting: Physician Assistant

## 2023-07-08 ENCOUNTER — Other Ambulatory Visit (HOSPITAL_BASED_OUTPATIENT_CLINIC_OR_DEPARTMENT_OTHER): Payer: Self-pay

## 2023-07-08 ENCOUNTER — Ambulatory Visit
Payer: Federal, State, Local not specified - PPO | Attending: Student in an Organized Health Care Education/Training Program | Admitting: Student in an Organized Health Care Education/Training Program

## 2023-07-08 ENCOUNTER — Encounter: Payer: Self-pay | Admitting: Student in an Organized Health Care Education/Training Program

## 2023-07-08 VITALS — BP 131/94 | HR 94 | Temp 97.5°F | Resp 16 | Ht 65.0 in | Wt 210.0 lb

## 2023-07-08 DIAGNOSIS — N809 Endometriosis, unspecified: Secondary | ICD-10-CM | POA: Insufficient documentation

## 2023-07-08 DIAGNOSIS — G8929 Other chronic pain: Secondary | ICD-10-CM | POA: Diagnosis not present

## 2023-07-08 DIAGNOSIS — G894 Chronic pain syndrome: Secondary | ICD-10-CM | POA: Diagnosis not present

## 2023-07-08 DIAGNOSIS — F320 Major depressive disorder, single episode, mild: Secondary | ICD-10-CM | POA: Diagnosis not present

## 2023-07-08 DIAGNOSIS — Z79899 Other long term (current) drug therapy: Secondary | ICD-10-CM | POA: Diagnosis not present

## 2023-07-08 DIAGNOSIS — R102 Pelvic and perineal pain: Secondary | ICD-10-CM | POA: Insufficient documentation

## 2023-07-08 MED ORDER — TAPENTADOL HCL 75 MG PO TABS
75.0000 mg | ORAL_TABLET | Freq: Two times a day (BID) | ORAL | 0 refills | Status: AC | PRN
  Filled 2023-08-10: qty 60, 30d supply, fill #0

## 2023-07-08 MED ORDER — TAPENTADOL HCL 75 MG PO TABS
75.0000 mg | ORAL_TABLET | Freq: Two times a day (BID) | ORAL | 0 refills | Status: AC | PRN
  Filled 2023-07-12: qty 60, 30d supply, fill #0

## 2023-07-08 MED ORDER — TAPENTADOL HCL 75 MG PO TABS
75.0000 mg | ORAL_TABLET | Freq: Two times a day (BID) | ORAL | 0 refills | Status: DC | PRN
  Filled 2023-09-09: qty 60, 30d supply, fill #0

## 2023-07-08 NOTE — Progress Notes (Signed)
 PROVIDER NOTE: Information contained herein reflects review and annotations entered in association with encounter. Interpretation of such information and data should be left to medically-trained personnel. Information provided to patient can be located elsewhere in the medical record under Patient Instructions. Document created using STT-dictation technology, any transcriptional errors that may result from process are unintentional.    Patient: Stacey Stanley  Service Category: E/M  Provider: Wallie Sherry, MD  DOB: May 15, 1976  DOS: 07/08/2023  Referring Provider: Sowles, Krichna, MD  MRN: [id number]  Specialty: Interventional Pain Management  PCP: Stacey Stanley, Krichna, MD  Type: Established Patient  Setting: Ambulatory outpatient    Location: Office  Delivery: Face-to-face     HPI  [name], a 48 y.o. year old female, is here today because of her Chronic pelvic pain in female [R10.2, G89.29]. [name] primary complain today is Pain (pelvis) Last encounter: My last encounter with her was on 04/08/23 Pertinent problems: [name] has Endometriosis; Chronic pelvic pain in female; Cervical radiculitis; Chronic pain syndrome; and Opiate use on their pertinent problem list. Pain Assessment: Severity of Chronic pain is reported as a 7 /10. Location: Pelvis  /to left side. Onset: More than a month ago. Quality: Stacey Stanley, Aching. Timing: Constant. Modifying factor(s): meds. Vitals:  height is 5' 5 (1.651 m) and weight is 210 lb (95.3 kg). Her temperature is 97.5 F (36.4 C) (abnormal). Her blood pressure is 131/94 (abnormal) and her pulse is 94. Her respiration is 16 and oxygen saturation is 100%.   Reason for encounter: medication management.   No change in medical history since last visit.  Patient's pain is at baseline.  Patient continues multimodal pain regimen as prescribed.  States that it provides pain relief and improvement in functional status.   Pharmacotherapy Assessment   Analgesic: Nucynta  75 mg twice daily as needed, quantity 45/month; MME equals 45   Monitoring: Stacey Stanley PMP: PDMP reviewed during this encounter.       Pharmacotherapy: No side-effects or adverse reactions reported. Compliance: No problems identified. Effectiveness: Clinically acceptable.  Stacey Pulling, RN  07/08/2023  2:58 PM  Sign when Signing Visit Nursing Pain Medication Assessment:  Safety precautions to be maintained throughout the outpatient stay will include: orient to surroundings, keep bed in low position, maintain call bell within reach at all times, provide assistance with transfer out of bed and ambulation.  Medication Inspection Compliance: Pill count conducted under aseptic conditions, in front of the patient. Neither the pills nor the bottle was removed from the patient's sight at any time. Once count was completed pills were immediately returned to the patient in their original bottle.  Medication: Tapentadol  (Nucynta ) Pill/Patch Count:  8 of 60 pills remain Pill/Patch Appearance: Markings consistent with prescribed medication Bottle Appearance: Standard pharmacy container. Clearly labeled. Filled Date: 1 / 10 / 2025 Last Medication intake:  Today    UDS:  Summary  Date Value Ref Range Status  01/12/2023 Note  Final    Comment:    ==================================================================== [employer] Select 13 (MW) ==================================================================== Test                             Result       Flag       Units  Drug Present and Declared for Prescription Verification   Tapentadol                      [phone]  EXPECTED   ng/mg creat    Source of tapentadol  is a scheduled prescription medication.  ==================================================================== Test                      Result    Flag   Units      Ref Range   Creatinine              152              mg/dL       >=79 ==================================================================== Declared Medications:  The flagging and interpretation on this report are based on the  following declared medications.  Unexpected results may arise from  inaccuracies in the declared medications.   **Note: The testing scope of this panel includes these medications:   Tapentadol  (Nucynta )   **Note: The testing scope of this panel does not include the  following reported medications:   Acetaminophen  (Tylenol )  Aspirin   Buspirone  (Buspar )  Clindamycin  (Cleocin )  Dapsone  Fish Oil  Hydrocortisone  Hydroxyzine  (Vistaril )  Ibuprofen  (Advil )  Imiquimod (Aldara)  Meloxicam  (Mobic )  Methylprednisolone  (Medrol )  Metoprolol  (Toprol )  Mupirocin  (Bactroban )  Naproxen  (Naprosyn )  Omeprazole  (Prilosec)  Plecanatide  (Trulance )  Semaglutide  (Wegovy )  Topical  Tretinoin  Valsartan  (Diovan ) ==================================================================== For clinical consultation, please call [phone]. ====================================================================      ROS  Constitutional: Denies any fever or chills Gastrointestinal: No reported hemesis, hematochezia, vomiting, or acute GI distress Musculoskeletal: pelvic pain Neurological: No reported episodes of acute onset apraxia, aphasia, dysarthria, agnosia, amnesia, paralysis, loss of coordination, or loss of consciousness  Medication Review  Dapsone, Fish Oil, Plecanatide , acetaminophen , amitriptyline , aspirin , busPIRone , clindamycin , ibuprofen , imiquimod, lubiprostone , metoprolol  succinate, mupirocin  ointment, naproxen , ondansetron , promethazine , tapentadol  HCl, and valsartan -hydrochlorothiazide   History Review  Allergy: Ms. [name] is allergic to nuvigil  [armodafinil ] and ppd [tuberculin purified protein derivative]. Drug: Ms. [name]  reports no history of drug use. Alcohol:  reports no history of alcohol use. Tobacco:  reports  that she has quit smoking. Her smoking use included cigarettes. She started smoking about 29 years ago. She has a 14.8 pack-year smoking history. She has never used smokeless tobacco. Social: Ms. [name]  reports that she has quit smoking. Her smoking use included cigarettes. She started smoking about 29 years ago. She has a 14.8 pack-year smoking history. She has never used smokeless tobacco. She reports that she does not drink alcohol and does not use drugs. Medical:  has a past medical history of Anxiety, Arthritis, Chest pain of uncertain etiology (05/02/2023), Chronic insomnia, Depression, Diabetes mellitus without complication (HCC), Dysrhythmia, Endometriosis, GERD (gastroesophageal reflux disease), Gout, Headache(784.0), History of kidney stones, Hypertension, Metabolic syndrome, Obesity, Right thyroid  nodule (2017), Serum calcium elevated, Sleep disorder, circadian, shift work type, SVD (spontaneous vaginal delivery), Tachycardia, and TIA (transient ischemic attack). Surgical: Ms. Ruest  has a past surgical history that includes metatarsil  (2003); Dilation and curettage of uterus; Induced abortion; laparoscopy (1992); Eye surgery (06/28/2013); laparoscopy (N/A, 07/19/2013); Hysteroscopy with D & C (N/A, 07/19/2013); Abdominal hysterectomy (05/21/2016); Hemorroidectomy (04/04/2018); Colonoscopy (2019); Colonoscopy with propofol  (N/A, 07/04/2021); Esophagogastroduodenoscopy (egd) with propofol  (N/A, 07/04/2021); Rectal surgery (08/15/2021); and Hand surgery (Right). Family: family history includes Alzheimer's disease in her paternal grandfather; Diabetes in her mother; Heart disease in her brother; Hypertension in her father and mother; Kidney disease in her father; Leukemia in her paternal grandmother; Stroke in her maternal grandmother.  Laboratory Chemistry Profile   Renal Lab Results  Component Value Date   BUN 14 05/19/2023  CREATININE 0.87 05/19/2023   BCR SEE NOTE: 08/13/2022   GFRAA  108 11/28/2020   GFRNONAA >60 05/19/2023    Hepatic Lab Results  Component Value Date   AST 11 08/13/2022   ALT 9 08/13/2022   ALBUMIN 4.1 05/17/2022   ALKPHOS 52 05/17/2022   AMYLASE 102 (H) 05/22/2016   LIPASE 13 04/01/2022    Electrolytes Lab Results  Component Value Date   NA 141 05/19/2023   K 3.9 05/19/2023   CL 106 05/19/2023   CALCIUM 9.6 05/19/2023   MG 1.7 12/13/2022    Bone Lab Results  Component Value Date   VD25OH 37 08/13/2022    Inflammation (CRP: Acute Phase) (ESR: Chronic Phase) Lab Results  Component Value Date   CRP 0.2 05/06/2020   ESRSEDRATE 2 05/06/2020         Note: Above Lab results reviewed.  Recent Imaging Review  CT Head Wo Contrast CLINICAL DATA:  Worsening headache for 4 days.  EXAM: CT HEAD WITHOUT CONTRAST  TECHNIQUE: Contiguous axial images were obtained from the base of the skull through the vertex without intravenous contrast.  RADIATION DOSE REDUCTION: This exam was performed according to the departmental dose-optimization program which includes automated exposure control, adjustment of the mA and/or kV according to patient size and/or use of iterative reconstruction technique.  COMPARISON:  05/17/2022  FINDINGS: Brain: There is no evidence for acute hemorrhage, hydrocephalus, mass lesion, or abnormal extra-axial fluid collection. No definite CT evidence for acute infarction.  Vascular: No hyperdense vessel or unexpected calcification.  Skull: No evidence for fracture. No worrisome lytic or sclerotic lesion.  Sinuses/Orbits: The visualized paranasal sinuses and mastoid air cells are clear. Visualized portions of the globes and intraorbital fat are unremarkable.  Other: None.  IMPRESSION: No acute intracranial abnormality.  Electronically Signed   By: Camellia Candle M.D.   On: 05/19/2023 12:12 Note: Reviewed        Physical Exam  General appearance: Well nourished, well developed, and well hydrated. In no  apparent acute distress Mental status: Alert, oriented x 3 (person, place, & time)       Respiratory: No evidence of acute respiratory distress Eyes: PERLA Vitals: BP (!) 131/94   Pulse 94   Temp (!) 97.5 F (36.4 C)   Resp 16   Ht 5' 5 (1.651 m)   Wt 210 lb (95.3 kg)   LMP 07/09/2015 (Exact Date)   SpO2 100%   BMI 34.95 kg/m  BMI: Estimated body mass index is 34.95 kg/m as calculated from the following:   Height as of this encounter: 5' 5 (1.651 m).   Weight as of this encounter: 210 lb (95.3 kg). Ideal: Ideal body weight: 57 kg (125 lb 10.6 oz) Adjusted ideal body weight: 72.3 kg (159 lb 6.4 oz)   Assessment   Diagnosis Status  1. Chronic pelvic pain in female   2. Chronic pain syndrome   3. Endometriosis   4. Mild major depression (HCC)   5. Long term prescription benzodiazepine use        Persistent Persistent Persistent    Plan of Care    Ms. Jinny R Krauser has a current medication list which includes the following long-term medication(s): amitriptyline , metoprolol  succinate, promethazine , and valsartan -hydrochlorothiazide .  Pharmacotherapy (Medications Ordered): Meds ordered this encounter  Medications   tapentadol  HCl (NUCYNTA ) 75 MG tablet    Sig: Take 1 tablet (75 mg total) by mouth 2 (two) times daily as needed for moderate pain (pain score 4-6).  Dispense:  60 tablet    Refill:  0    Chronic pain syndrome   tapentadol  HCl (NUCYNTA ) 75 MG tablet    Sig: Take 1 tablet (75 mg total) by mouth 2 (two) times daily as needed for moderate pain (pain score 4-6).    Dispense:  60 tablet    Refill:  0    Chronic pain syndrome   tapentadol  HCl (NUCYNTA ) 75 MG tablet    Sig: Take 1 tablet (75 mg total) by mouth 2 (two) times daily as needed for moderate pain (pain score 4-6).    Dispense:  60 tablet    Refill:  0    Chronic pain syndrome   Orders:  No orders of the defined types were placed in this encounter.  Follow-up plan:   Return in about  3 months (around 10/05/2023) for MM, F2F.    Recent Visits No visits were found meeting these conditions. Showing recent visits within past 90 days and meeting all other requirements Today's Visits Date Type Provider Dept  07/08/23 Office Visit Marcelino Nurse, MD [facility]  Showing today's visits and meeting all other requirements Future Appointments Date Type Provider Dept  09/30/23 Appointment Marcelino Nurse, MD [facility]  Showing future appointments within next 90 days and meeting all other requirements  I discussed the assessment and treatment plan with the patient. The patient was provided an opportunity to ask questions and all were answered. The patient agreed with the plan and demonstrated an understanding of the instructions.  Patient advised to call back or seek an in-person evaluation if the symptoms or condition worsens.  Duration of encounter: .  Total time on encounter, as per AMA guidelines included both the face-to-face and non-face-to-face time personally spent by the physician and/or other qualified health care professional(s) on the day of the encounter (includes time in activities that require the physician or other qualified health care professional and does not include time in activities normally performed by clinical staff). Physician's time may include the following activities when performed: preparing to see the patient (eg, review of tests, pre-charting review of records) obtaining and/or reviewing separately obtained history performing a medically appropriate examination and/or evaluation counseling and educating the patient/family/caregiver ordering medications, tests, or procedures referring and communicating with other health care professionals (when not separately reported) documenting clinical information in the electronic or other health record independently interpreting results (not separately reported) and communicating  results to the patient/ family/caregiver care coordination (not separately reported)  Note by: Nurse Marcelino, MD Date: 07/08/2023; Time: 3:24 PM

## 2023-07-08 NOTE — Patient Instructions (Signed)

## 2023-07-08 NOTE — Progress Notes (Signed)
 Nursing Pain Medication Assessment:  Safety precautions to be maintained throughout the outpatient stay will include: orient to surroundings, keep bed in low position, maintain call bell within reach at all times, provide assistance with transfer out of bed and ambulation.  Medication Inspection Compliance: Pill count conducted under aseptic conditions, in front of the patient. Neither the pills nor the bottle was removed from the patient's sight at any time. Once count was completed pills were immediately returned to the patient in their original bottle.  Medication: Tapentadol  (Nucynta ) Pill/Patch Count:  8 of 60 pills remain Pill/Patch Appearance: Markings consistent with prescribed medication Bottle Appearance: Standard pharmacy container. Clearly labeled. Filled Date: 1 / 10 / 2025 Last Medication intake:  Today

## 2023-07-12 ENCOUNTER — Other Ambulatory Visit (HOSPITAL_BASED_OUTPATIENT_CLINIC_OR_DEPARTMENT_OTHER): Payer: Self-pay

## 2023-07-16 ENCOUNTER — Other Ambulatory Visit: Payer: Self-pay | Admitting: Physician Assistant

## 2023-07-16 DIAGNOSIS — F411 Generalized anxiety disorder: Secondary | ICD-10-CM

## 2023-07-16 DIAGNOSIS — I10 Essential (primary) hypertension: Secondary | ICD-10-CM

## 2023-07-17 ENCOUNTER — Other Ambulatory Visit (HOSPITAL_BASED_OUTPATIENT_CLINIC_OR_DEPARTMENT_OTHER): Payer: Self-pay

## 2023-07-19 ENCOUNTER — Other Ambulatory Visit: Payer: Self-pay

## 2023-07-19 ENCOUNTER — Other Ambulatory Visit (HOSPITAL_BASED_OUTPATIENT_CLINIC_OR_DEPARTMENT_OTHER): Payer: Self-pay

## 2023-07-19 MED ORDER — VALSARTAN-HYDROCHLOROTHIAZIDE 80-12.5 MG PO TABS
1.0000 | ORAL_TABLET | Freq: Every day | ORAL | 0 refills | Status: DC
  Filled 2023-07-19 – 2023-07-26 (×3): qty 90, 90d supply, fill #0

## 2023-07-19 MED ORDER — BUSPIRONE HCL 10 MG PO TABS
10.0000 mg | ORAL_TABLET | Freq: Two times a day (BID) | ORAL | 0 refills | Status: DC
  Filled 2023-07-19 (×2): qty 180, 90d supply, fill #0

## 2023-07-19 NOTE — Telephone Encounter (Signed)
 Requested Prescriptions  Pending Prescriptions Disp Refills   valsartan-hydrochlorothiazide (DIOVAN-HCT) 80-12.5 MG tablet 90 tablet 0    Sig: Take 1 tablet by mouth daily.     Cardiovascular: ARB + Diuretic Combos Failed - 07/19/2023 11:49 AM      Failed - Last BP in normal range    BP Readings from Last 1 Encounters:  07/08/23 (!) 131/94         Passed - K in normal range and within 180 days    Potassium  Date Value Ref Range Status  05/19/2023 3.9 3.5 - 5.1 mmol/L Final         Passed - Na in normal range and within 180 days    Sodium  Date Value Ref Range Status  05/19/2023 141 135 - 145 mmol/L Final         Passed - Cr in normal range and within 180 days    Creat  Date Value Ref Range Status  08/13/2022 0.82 0.50 - 0.99 mg/dL Final   Creatinine, Ser  Date Value Ref Range Status  05/19/2023 0.87 0.44 - 1.00 mg/dL Final         Passed - eGFR is 10 or above and within 180 days    GFR, Est African American  Date Value Ref Range Status  11/28/2020 108 > OR = 60 mL/min/1.17m2 Final   GFR, Est Non African American  Date Value Ref Range Status  11/28/2020 93 > OR = 60 mL/min/1.57m2 Final   GFR, Estimated  Date Value Ref Range Status  05/19/2023 >60 >60 mL/min Final    Comment:    (NOTE) Calculated using the CKD-EPI Creatinine Equation (2021)    eGFR  Date Value Ref Range Status  08/13/2022 88 > OR = 60 mL/min/1.19m2 Final         Passed - Patient is not pregnant      Passed - Valid encounter within last 6 months    Recent Outpatient Visits           2 months ago Major depression, recurrent, chronic (HCC)   [facility] Mecum, Oswaldo Conroy, PA-C   2 months ago Right ear pain   [facility] Margarita Mail, DO   5 months ago GAD (generalized anxiety disorder)   [facility] Alba Cory, MD   6 months ago Palpitations   [facility] [place], Sheliah Mends, PA-C    8 months ago Hypertension, benign   [facility] Alba Cory, MD       Future Appointments             In 2 weeks Alba Cory, MD [facility]             busPIRone (BUSPAR) 10 MG tablet 180 tablet 0    Sig: Take 1 tablet (10 mg total) by mouth 2 (two) times daily.     Psychiatry: Anxiolytics/Hypnotics - Non-controlled Passed - 07/19/2023 11:49 AM      Passed - Valid encounter within last 12 months    Recent Outpatient Visits           2 months ago Major depression, recurrent, chronic (HCC)   [place] [facility] Mecum, Oswaldo Conroy, PA-C   2 months ago Right ear pain    [facility] Margarita Mail, DO   5 months ago GAD (generalized anxiety disorder)   [facility] Alba Cory, MD   6 months ago Palpitations   [facility] [place] [place]   8 months ago Hypertension, benign   [facility] Alba Cory, MD       Future Appointments             In 2 weeks Alba Cory, MD [facility], [facility]

## 2023-07-26 ENCOUNTER — Other Ambulatory Visit (HOSPITAL_BASED_OUTPATIENT_CLINIC_OR_DEPARTMENT_OTHER): Payer: Self-pay

## 2023-08-02 NOTE — Progress Notes (Deleted)
 Follow-up Visit   Date: 08/03/2023    Stacey Stanley MRN: [id number] DOB: 12-31-1975    Stacey Stanley is a 48 y.o. 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  The patient was accompanied self.     IMPRESSION/PLAN: Bilateral leg dysesthesia suggestive of restless leg syndrome  - Start ropinirole 0.25mg  at bedtime  - Check TSH, ferritin  Generalized paresthesias, resolved.  Return to clinic in 4 months  --------------------------------------------- History of present illness: In July 2023, she had left face, arm and leg numbness/tingling which progressed over 2-3 days.  She went to the ER for this and had MRI brain which did not show any acute change.  She was started on dual-antiplatelet therapy for possible TIA and now takes aspirin.  No associated headaches. She continues to have these symptoms 4-5 times per week, which lasts about 6 hours.    She also complains of crawling sensation in the legs which is intermittent throughout the week.  This feels as if there could stray hair on her skin.  These symptoms last a few minutes.  She denies urge to move the legs.   No falls or imbalance.    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    UPDATE 03/23/2023:  She is here for follow-up visit with new complaints of restless sensation of the legs.  Starting around July, she began having electrical sensation in the legs which she is awake and finds that her legs are always moving.  She denies the urge to move the legs, but getting up and walking tends to alleviate her symptoms.  She has tried leg massagers which can help. It can last for several hours.   She no longer has generalized numbness/tingling for which she was seen here in 2023.   UPDATE 08/03/2023:  She is here for 6 month follow-up visit.  ***At  her last visit, ropinirole 0.25mg  at bedtime was started.  She reports ***  Medications:  Current Outpatient Medications on File Prior to Visit  Medication Sig Dispense Refill   acetaminophen (TYLENOL) 500 MG tablet Take 500 mg by mouth every 6 (six) hours as needed for mild pain.     amitriptyline (ELAVIL) 10 MG tablet Take 1 tablet (10 mg total) by mouth at bedtime. 30 tablet 3   aspirin 81 MG chewable tablet Chew 1 tablet (81 mg total) by mouth daily. 90 tablet 0   busPIRone (BUSPAR) 10 MG tablet Take 1 tablet (10 mg total) by mouth 2 (two) times daily. 180 tablet 0   clindamycin (CLEOCIN T) 1 % lotion Apply 1 application to affected area topically every morning. 60 mL 2   Dapsone 5 % topical gel Apply 60 g topically daily.     ibuprofen (ADVIL) 200 MG tablet Take 200 mg by mouth every 6 (six) hours as needed for mild pain.     imiquimod (ALDARA) 5 % cream Apply 1 packet topically daily as needed (warts).     lubiprostone (AMITIZA) 24 MCG capsule Take 1 capsule (24 mcg total) by mouth 2 (two) times daily with a meal. 60 capsule 11   metoprolol succinate (TOPROL-XL) 25 MG 24 hr tablet Take 1 tablet (25 mg total) by mouth daily. 90 tablet 0   mupirocin ointment (BACTROBAN) 2 % Apply 1 Application topically to affected area 2 (two) to  3 (three) times daily. 22 g 1   naproxen (NAPROSYN) 375 MG tablet Take 1 tablet (375 mg total) by mouth 2 (two) times daily. 20 tablet 0   Omega-3 Fatty Acids (FISH OIL) 1000 MG CAPS Take 2,000 mg by mouth daily.     ondansetron (ZOFRAN) 4 MG tablet Take 1 tablet (4 mg total) by mouth every 8 (eight) hours as needed for nausea or vomiting. 30 tablet 1   promethazine (PHENERGAN) 25 MG tablet Take 1 tablet (25 mg total) by mouth every 6 (six) hours as needed for nausea or vomiting. 14 tablet 0   tapentadol HCl (NUCYNTA) 75 MG tablet Take 1 tablet (75 mg total) by mouth 2 (two) times daily as needed for moderate pain (pain score 4-6). 60 tablet 0   [START ON  08/10/2023] tapentadol HCl (NUCYNTA) 75 MG tablet Take 1 tablet (75 mg total) by mouth 2 (two) times daily as needed for moderate pain (pain score 4-6). 60 tablet 0   [START ON 09/09/2023] tapentadol HCl (NUCYNTA) 75 MG tablet Take 1 tablet (75 mg total) by mouth 2 (two) times daily as needed for moderate pain (pain score 4-6). 60 tablet 0   TRULANCE 3 MG TABS Take 1 tablet (3 mg total) by mouth daily as needed (constipation). 30 tablet 11   valsartan-hydrochlorothiazide (DIOVAN-HCT) 80-12.5 MG tablet Take 1 tablet by mouth daily. 90 tablet 0   No current facility-administered medications on file prior to visit.    Allergies:  Allergies  Allergen Reactions   Nuvigil [Armodafinil]     Chest pain    Ppd [Tuberculin Purified Protein Derivative]     Vital Signs:  LMP 07/09/2015 (Exact Date)    Neurological Exam: MENTAL STATUS including orientation to time, place, person, recent and remote memory, attention span and concentration, language, and fund of knowledge is normal.  Speech is not dysarthric.  CRANIAL NERVES:  Pupils equal round and reactive to light.  Normal conjugate, extra-ocular eye movements in all directions of gaze.  No ptosis.  Face is symmetric. Palate elevates symmetrically.  Tongue is midline.  MOTOR:  Motor strength is 5/5 in all extremities.  No atrophy, fasciculations or abnormal movements.  No pronator drift.  Tone is normal.    MSRs:  Reflexes are 2+/4 throughout.  SENSORY:  Intact to vibration and pin prick throughout.  COORDINATION/GAIT:  Normal finger-to- nose-finger.  Intact rapid alternating movements bilaterally.  Gait narrow based and stable.   Data: Lab Results  Component Value Date   TSH 0.86 03/23/2023   Lab Results  Component Value Date   VITAMINB12 378 08/13/2022   Lab Results  Component Value Date   FERRITIN 57.3 03/23/2023   MRI cervical spine 07/18/2022: 1. Motion degraded examination. 2. Mild cervical spondylosis without significant spinal  stenosis. 3. Mild right neural foraminal stenosis at C5-6.  MRI brain 05/17/2022: Substantial susceptibility artifact from dental braces, similar to July Brain MRI. No intracranial abnormality identified.   Thank you for allowing me to participate in patient's care.  If I can answer any additional questions, I would be pleased to do so.    Sincerely,    Stela Iwasaki K. Allena Katz, DO

## 2023-08-03 ENCOUNTER — Telehealth: Payer: Self-pay | Admitting: Physician Assistant

## 2023-08-03 ENCOUNTER — Ambulatory Visit: Payer: Federal, State, Local not specified - PPO | Admitting: Neurology

## 2023-08-03 NOTE — Telephone Encounter (Signed)
 The called in and left a voicemail requesting to schedule a follow up appointment with Celso Amy. I call her to let her know that I receive the patient message, and I left a voicemail for her to call us back.

## 2023-08-05 ENCOUNTER — Ambulatory Visit: Payer: Federal, State, Local not specified - PPO | Admitting: Family Medicine

## 2023-08-05 ENCOUNTER — Telehealth: Payer: Self-pay

## 2023-08-05 ENCOUNTER — Encounter: Payer: Self-pay | Admitting: Family Medicine

## 2023-08-05 ENCOUNTER — Encounter (HOSPITAL_BASED_OUTPATIENT_CLINIC_OR_DEPARTMENT_OTHER): Payer: Self-pay

## 2023-08-05 ENCOUNTER — Other Ambulatory Visit (HOSPITAL_BASED_OUTPATIENT_CLINIC_OR_DEPARTMENT_OTHER): Payer: Self-pay

## 2023-08-05 ENCOUNTER — Other Ambulatory Visit (HOSPITAL_COMMUNITY): Payer: Self-pay

## 2023-08-05 VITALS — BP 104/76 | HR 99 | Resp 16 | Ht 65.0 in | Wt 199.8 lb

## 2023-08-05 DIAGNOSIS — F339 Major depressive disorder, recurrent, unspecified: Secondary | ICD-10-CM | POA: Diagnosis not present

## 2023-08-05 DIAGNOSIS — M542 Cervicalgia: Secondary | ICD-10-CM | POA: Diagnosis not present

## 2023-08-05 DIAGNOSIS — M4802 Spinal stenosis, cervical region: Secondary | ICD-10-CM

## 2023-08-05 DIAGNOSIS — I10 Essential (primary) hypertension: Secondary | ICD-10-CM | POA: Diagnosis not present

## 2023-08-05 DIAGNOSIS — G8929 Other chronic pain: Secondary | ICD-10-CM

## 2023-08-05 DIAGNOSIS — F411 Generalized anxiety disorder: Secondary | ICD-10-CM

## 2023-08-05 DIAGNOSIS — E66811 Obesity, class 1: Secondary | ICD-10-CM | POA: Diagnosis not present

## 2023-08-05 DIAGNOSIS — Z23 Encounter for immunization: Secondary | ICD-10-CM

## 2023-08-05 DIAGNOSIS — I471 Supraventricular tachycardia, unspecified: Secondary | ICD-10-CM

## 2023-08-05 DIAGNOSIS — G2581 Restless legs syndrome: Secondary | ICD-10-CM

## 2023-08-05 MED ORDER — BACLOFEN 10 MG PO TABS
10.0000 mg | ORAL_TABLET | Freq: Every day | ORAL | 0 refills | Status: DC | PRN
  Filled 2023-08-05: qty 90, 90d supply, fill #0

## 2023-08-05 MED ORDER — METOPROLOL SUCCINATE ER 25 MG PO TB24
25.0000 mg | ORAL_TABLET | Freq: Every day | ORAL | 0 refills | Status: DC
  Filled 2023-08-05: qty 90, 90d supply, fill #0

## 2023-08-05 MED ORDER — WEGOVY 1 MG/0.5ML ~~LOC~~ SOAJ
1.0000 mg | SUBCUTANEOUS | 0 refills | Status: DC
Start: 2023-08-05 — End: 2023-10-29
  Filled 2023-08-05 – 2023-08-15 (×2): qty 6, 84d supply, fill #0

## 2023-08-05 NOTE — Telephone Encounter (Signed)
 [facility] pharmacy prior auth   Semaglutide-Weight Management [facility]) 1 MG/0.5ML SOAJ   [id number]

## 2023-08-05 NOTE — Telephone Encounter (Signed)
 PA done waiting on insurance to determine
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
## 2023-08-07 ENCOUNTER — Other Ambulatory Visit (HOSPITAL_BASED_OUTPATIENT_CLINIC_OR_DEPARTMENT_OTHER): Payer: Self-pay

## 2023-08-09 ENCOUNTER — Other Ambulatory Visit (HOSPITAL_BASED_OUTPATIENT_CLINIC_OR_DEPARTMENT_OTHER): Payer: Self-pay

## 2023-08-10 ENCOUNTER — Other Ambulatory Visit (HOSPITAL_BASED_OUTPATIENT_CLINIC_OR_DEPARTMENT_OTHER): Payer: Self-pay

## 2023-08-16 ENCOUNTER — Other Ambulatory Visit (HOSPITAL_BASED_OUTPATIENT_CLINIC_OR_DEPARTMENT_OTHER): Payer: Self-pay

## 2023-08-17 ENCOUNTER — Encounter: Payer: Self-pay | Admitting: Family Medicine

## 2023-08-18 ENCOUNTER — Other Ambulatory Visit: Payer: Self-pay

## 2023-08-21 ENCOUNTER — Other Ambulatory Visit (HOSPITAL_BASED_OUTPATIENT_CLINIC_OR_DEPARTMENT_OTHER): Payer: Self-pay

## 2023-09-01 ENCOUNTER — Other Ambulatory Visit (HOSPITAL_BASED_OUTPATIENT_CLINIC_OR_DEPARTMENT_OTHER): Payer: Self-pay

## 2023-09-03 ENCOUNTER — Other Ambulatory Visit (HOSPITAL_BASED_OUTPATIENT_CLINIC_OR_DEPARTMENT_OTHER): Payer: Self-pay

## 2023-09-07 ENCOUNTER — Ambulatory Visit: Payer: Self-pay

## 2023-09-07 ENCOUNTER — Ambulatory Visit: Admitting: Internal Medicine

## 2023-09-07 NOTE — Telephone Encounter (Signed)
 Chief Complaint: swelling in left ac Symptoms: swelling, pain, bruise Frequency: x 2 weeks Pertinent Negatives: Patient denies warmth  Disposition: [] ED /[] Urgent Care (no appt availability in office) / [x] Appointment(In office/virtual)/ []  [facility]/ [] Home Care/ [] Refused Recommended Disposition /[] [facility]/ []  Follow-up with PCP Additional Notes: Patient states that for 2 weeks she has noticed that her interior left ac has been swollen and tender to touch, and red/purple bruise about 4 in. Patient states that she doesn't recall anything to cause this. Denies recent blood work or anything to the area. States that the vein feels hard. States that bruising is a half moon shape and almost looks like a bite. Patient states doesn't want to go to  ED as this is ongoing for 2 weeks and not new.   Copied from CRM [id number]. Topic: Clinical - Red Word Triage >> Sep 07, 2023 10:29 AM Desma Mcgregor wrote: Red Word that prompted transfer to Nurse Triage: Swollen/hard vein in left arm and is being explained as "hard as a rock".  It is very painful. Dizziness as well for the last 2-3 weeks now. Does not want to go to ER. Reason for Disposition  [1] Red area or streak [2] large (> 2 in. or 5 cm)  Answer Assessment - Initial Assessment Questions 1. ONSET: "When did the swelling start?" (e.g., minutes, hours, days)    2 weeks 2. LOCATION: "What part of the arm is swollen?"  "Are both arms swollen or just one arm?"     Left interior ac vein 3. SEVERITY: "How bad is the swelling?" (e.g., localized; mild, moderate, severe)   - LOCALIZED: Small area of puffiness or swelling on just one arm   - JOINT SWELLING: Swelling of one joint   - MILD: Puffiness or swelling of hand   - MODERATE: Puffiness or swollen feeling of entire arm    - SEVERE: All of arm looks swollen; pitting edema     Localized to left interior ac 4. REDNESS: "Does the swelling look red or infected?"    Purple bruise about   5. PAIN: "Is the swelling painful to touch?" If Yes, ask: "How painful is it?"   (Scale 1-10; mild, moderate or severe)     Very painful to touch 6. FEVER: "Do you have a fever?" If Yes, ask: "What is it, how was it measured, and when did it start?"      No fever 7. CAUSE: "What do you think is causing the arm swelling?"     unknown 8. MEDICAL HISTORY: "Do you have a history of heart failure, kidney disease, liver failure, or cancer?"     no 9. RECURRENT SYMPTOM: "Have you had arm swelling before?" If Yes, ask: "When was the last time?" "What happened that time?"     no 10. OTHER SYMPTOMS: "Do you have any other symptoms?" (e.g., chest pain, difficulty breathing)       Headache, dizziness and chest pain  Protocols used: Arm Swelling and Edema-A-AH

## 2023-09-08 ENCOUNTER — Other Ambulatory Visit (HOSPITAL_BASED_OUTPATIENT_CLINIC_OR_DEPARTMENT_OTHER): Payer: Self-pay

## 2023-09-09 ENCOUNTER — Other Ambulatory Visit (HOSPITAL_BASED_OUTPATIENT_CLINIC_OR_DEPARTMENT_OTHER): Payer: Self-pay

## 2023-09-09 ENCOUNTER — Ambulatory Visit: Admitting: Family Medicine

## 2023-09-17 ENCOUNTER — Encounter: Payer: Self-pay | Admitting: Family Medicine

## 2023-09-19 ENCOUNTER — Other Ambulatory Visit: Payer: Self-pay | Admitting: Family Medicine

## 2023-09-19 MED ORDER — METAXALONE 800 MG PO TABS
800.0000 mg | ORAL_TABLET | Freq: Three times a day (TID) | ORAL | 0 refills | Status: DC | PRN
  Filled 2023-09-19: qty 90, 30d supply, fill #0

## 2023-09-20 ENCOUNTER — Other Ambulatory Visit (HOSPITAL_BASED_OUTPATIENT_CLINIC_OR_DEPARTMENT_OTHER): Payer: Self-pay

## 2023-09-24 DIAGNOSIS — S20212A Contusion of left front wall of thorax, initial encounter: Secondary | ICD-10-CM | POA: Diagnosis not present

## 2023-09-24 DIAGNOSIS — R0781 Pleurodynia: Secondary | ICD-10-CM | POA: Diagnosis not present

## 2023-09-27 ENCOUNTER — Encounter: Payer: Self-pay | Admitting: Student in an Organized Health Care Education/Training Program

## 2023-09-30 ENCOUNTER — Encounter: Payer: Self-pay | Admitting: Nurse Practitioner

## 2023-09-30 ENCOUNTER — Other Ambulatory Visit (HOSPITAL_BASED_OUTPATIENT_CLINIC_OR_DEPARTMENT_OTHER): Payer: Self-pay

## 2023-09-30 ENCOUNTER — Ambulatory Visit
Payer: Federal, State, Local not specified - PPO | Attending: Student in an Organized Health Care Education/Training Program | Admitting: Nurse Practitioner

## 2023-09-30 VITALS — BP 134/80 | HR 99 | Temp 97.4°F | Ht 65.0 in | Wt 195.0 lb

## 2023-09-30 DIAGNOSIS — G8929 Other chronic pain: Secondary | ICD-10-CM | POA: Diagnosis not present

## 2023-09-30 DIAGNOSIS — M5412 Radiculopathy, cervical region: Secondary | ICD-10-CM

## 2023-09-30 DIAGNOSIS — N809 Endometriosis, unspecified: Secondary | ICD-10-CM | POA: Diagnosis not present

## 2023-09-30 DIAGNOSIS — G894 Chronic pain syndrome: Secondary | ICD-10-CM

## 2023-09-30 DIAGNOSIS — Z79899 Other long term (current) drug therapy: Secondary | ICD-10-CM

## 2023-09-30 DIAGNOSIS — R102 Pelvic and perineal pain unspecified side: Secondary | ICD-10-CM

## 2023-09-30 MED ORDER — TAPENTADOL HCL 75 MG PO TABS
75.0000 mg | ORAL_TABLET | Freq: Two times a day (BID) | ORAL | 0 refills | Status: AC | PRN
Start: 2023-10-09 — End: 2023-11-08
  Filled 2023-10-09: qty 60, 30d supply, fill #0

## 2023-09-30 MED ORDER — TAPENTADOL HCL 75 MG PO TABS
75.0000 mg | ORAL_TABLET | Freq: Two times a day (BID) | ORAL | 0 refills | Status: DC | PRN
  Filled 2023-12-08: qty 60, 30d supply, fill #0

## 2023-09-30 MED ORDER — TAPENTADOL HCL 75 MG PO TABS
75.0000 mg | ORAL_TABLET | Freq: Two times a day (BID) | ORAL | 0 refills | Status: AC | PRN
  Filled 2023-11-08: qty 60, 30d supply, fill #0

## 2023-09-30 NOTE — Progress Notes (Signed)
 PROVIDER NOTE: Interpretation of information contained herein should be left to medically-trained personnel. Specific patient instructions are provided elsewhere under "Patient Instructions" section of medical record. This document was created in part using AI and STT-dictation technology, any transcriptional errors that may result from this process are unintentional.  Patient: Stacey Stanley  Service: E/M   PCP: Sowles, Krichna, MD  DOB: 1975/12/25  DOS: 09/30/2023  Provider: Cherylin Corrigan, NP  MRN: [id number]  Delivery: Face-to-face  Specialty: Interventional Pain Management  Type: Established Patient  Setting: Ambulatory outpatient facility  Specialty designation: 09  Referring Prov.: Sowles, Krichna, MD  Location: Outpatient office facility       HPI  [name], a 48 y.o. year old female, is here today because of her Chronic pelvic pain in female [R10.2, G89.29]. Stacey Stanley primary complain today is Pelvic Pain (And rectal pain)   Pain Assessment: Severity of Chronic pain is reported as a 6 /10. Location: Pelvis (and rectal pain)  /denies. Onset: More than a month ago. Quality:  (electrical feeling). Timing: Constant. Modifying factor(s): meds. Vitals:  height is 5\' 5"  (1.651 m) and weight is 195 lb (88.5 kg). Her temperature is 97.4 F (36.3 C) (abnormal). Her blood pressure is 134/80 and her pulse is 99. Her oxygen saturation is 100%.  BMI: Estimated body mass index is 32.45 kg/m as calculated from the following:   Height as of this encounter: 5\' 5"  (1.651 m).   Weight as of this encounter: 195 lb (88.5 kg). Last encounter: 07/08/2023 Last procedure: Visit date not found.  Reason for encounter: medication management. No change in medical history since last visit.  Patient's pain is at baseline.  Patient continues multimodal pain regimen as prescribed.  States that it provides pain relief and improvement in functional status.  The patient has history of squamous cell  carcinoma, diagnosed in February 2023, which is contributing to her rectal pain.  Patient recently graduated as a Publishing rights manager.  Pharmacotherapy Assessment  Analgesic: tapentadol  HCl (NUCYNTA ) 75 mg tablet two times daily as needed for moderate pain. MME=60 Monitoring: [place] PMP: PDMP reviewed during this encounter.       Pharmacotherapy: No side-effects or adverse reactions reported. Compliance: No problems identified. Effectiveness: Clinically acceptable.  Stacey Staple, RN  09/30/2023  1:39 PM  Sign when Signing Visit Safety precautions to be maintained throughout the outpatient stay will include: orient to surroundings, keep bed in low position, maintain call bell within reach at all times, provide assistance with transfer out of bed and ambulation.   Nursing Pain Medication Assessment:  Safety precautions to be maintained throughout the outpatient stay will include: orient to surroundings, keep bed in low position, maintain call bell within reach at all times, provide assistance with transfer out of bed and ambulation.  Medication Inspection Compliance: Pill count conducted under aseptic conditions, in front of the patient. Neither the pills nor the bottle was removed from the patient's sight at any time. Once count was completed pills were immediately returned to the patient in their original bottle.  Medication:  nucynta  Pill/Patch Count:  14 of 60 pills remain Pill/Patch Appearance: Markings consistent with prescribed medication Bottle Appearance: Standard pharmacy container. Clearly labeled. Filled Date: 4 / 10 / 2025 Last Medication intake:  Today    No results found for: "CBDTHCR" No results found for: "D8THCCBX" No results found for: "D9THCCBX"  UDS:  Summary  Date Value Ref Range Status  01/12/2023 Note  Final  Comment:    ==================================================================== [employer] Select 13  (MW) ==================================================================== Test                             Result       Flag       Units  Drug Present and Declared for Prescription Verification   Tapentadol                      >6579        EXPECTED   ng/mg creat    Source of tapentadol  is a scheduled prescription medication.  ==================================================================== Test                      Result    Flag   Units      Ref Range   Creatinine              152              mg/dL      >=78 ==================================================================== Declared Medications:  The flagging and interpretation on this report are based on the  following declared medications.  Unexpected results may arise from  inaccuracies in the declared medications.   **Note: The testing scope of this panel includes these medications:   Tapentadol  (Nucynta )   **Note: The testing scope of this panel does not include the  following reported medications:   Acetaminophen  (Tylenol )  Aspirin   Buspirone  (Buspar )  Clindamycin  (Cleocin )  Dapsone  Fish Oil  Hydrocortisone  Hydroxyzine  (Vistaril )  Ibuprofen  (Advil )  Imiquimod (Aldara)  Meloxicam  (Mobic )  Methylprednisolone  (Medrol )  Metoprolol  (Toprol )  Mupirocin  (Bactroban )  Naproxen  (Naprosyn )  Omeprazole  (Prilosec)  Plecanatide  (Trulance )  Semaglutide  (Wegovy )  Topical  Tretinoin  Valsartan  (Diovan ) ==================================================================== For clinical consultation, please call [phone]. ====================================================================       ROS  Constitutional: Denies any fever or chills Gastrointestinal: No reported hemesis, hematochezia, vomiting, or acute GI distress Musculoskeletal: Pelvic pain, rectal pain Neurological: No reported episodes of acute onset apraxia, aphasia, dysarthria, agnosia, amnesia, paralysis, loss of coordination, or loss of  consciousness  Medication Review  Dapsone, Fish Oil, Plecanatide , Semaglutide -Weight Management, acetaminophen , amitriptyline , aspirin , busPIRone , clindamycin , imiquimod, lubiprostone , metaxalone , metoprolol  succinate, mupirocin  ointment, ondansetron , promethazine , tapentadol  HCl, and valsartan -hydrochlorothiazide   History Review  Allergy: Ms. Mane is allergic to nuvigil  [armodafinil ] and ppd [tuberculin purified protein derivative]. Drug: Ms. Hackenburg  reports no history of drug use. Alcohol:  reports no history of alcohol use. Tobacco:  reports that she has quit smoking. Her smoking use included cigarettes. She started smoking about 29 years ago. She has a 14.9 pack-year smoking history. She has never used smokeless tobacco. Social: Ms. Alles  reports that she has quit smoking. Her smoking use included cigarettes. She started smoking about 29 years ago. She has a 14.9 pack-year smoking history. She has never used smokeless tobacco. She reports that she does not drink alcohol and does not use drugs. Medical:  has a past medical history of Anxiety, Arthritis, Chest pain of uncertain etiology (05/02/2023), Chronic insomnia, Depression, Diabetes mellitus without complication (HCC), Dysrhythmia, Endometriosis, GERD (gastroesophageal reflux disease), Gout, Headache(784.0), History of kidney stones, Hypertension, Metabolic syndrome, Obesity, Right thyroid  nodule (2017), Serum calcium elevated, Sleep disorder, circadian, shift work type, SVD (spontaneous vaginal delivery), Tachycardia, and TIA (transient ischemic attack). Surgical: Ms. Yaccarino  has a past surgical history that includes metatarsil  (2003); Dilation and curettage of uterus; Induced abortion; laparoscopy (1992); Eye  surgery (06/28/2013); laparoscopy (N/A, 07/19/2013); Hysteroscopy with D & C (N/A, 07/19/2013); Abdominal hysterectomy (05/21/2016); Hemorroidectomy (04/04/2018); Colonoscopy (2019); Colonoscopy with propofol  (N/A, 07/04/2021);  Esophagogastroduodenoscopy (egd) with propofol  (N/A, 07/04/2021); Rectal surgery (08/15/2021); and Hand surgery (Right). Family: family history includes Alzheimer's disease in her paternal grandfather; Diabetes in her mother; Heart disease in her brother; Hypertension in her father and mother; Kidney disease in her father; Leukemia in her paternal grandmother; Stroke in her maternal grandmother.  Laboratory Chemistry Profile   Renal Lab Results  Component Value Date   BUN 14 05/19/2023   CREATININE 0.87 05/19/2023   BCR SEE NOTE: 08/13/2022   GFRAA 108 11/28/2020   GFRNONAA >60 05/19/2023    Hepatic Lab Results  Component Value Date   AST 11 08/13/2022   ALT 9 08/13/2022   ALBUMIN 4.1 05/17/2022   ALKPHOS 52 05/17/2022   AMYLASE 102 (H) 05/22/2016   LIPASE 13 04/01/2022    Electrolytes Lab Results  Component Value Date   NA 141 05/19/2023   K 3.9 05/19/2023   CL 106 05/19/2023   CALCIUM 9.6 05/19/2023   MG 1.7 12/13/2022    Bone Lab Results  Component Value Date   VD25OH 37 08/13/2022    Inflammation (CRP: Acute Phase) (ESR: Chronic Phase) Lab Results  Component Value Date   CRP 0.2 05/06/2020   ESRSEDRATE 2 05/06/2020         Note: Above Lab results reviewed.  Recent Imaging Review  CT Head Wo Contrast CLINICAL DATA:  Worsening headache for 4 days.  EXAM: CT HEAD WITHOUT CONTRAST  TECHNIQUE: Contiguous axial images were obtained from the base of the skull through the vertex without intravenous contrast.  RADIATION DOSE REDUCTION: This exam was performed according to the departmental dose-optimization program which includes automated exposure control, adjustment of the mA and/or kV according to patient size and/or use of iterative reconstruction technique.  COMPARISON:  05/17/2022  FINDINGS: Brain: There is no evidence for acute hemorrhage, hydrocephalus, mass lesion, or abnormal extra-axial fluid collection. No definite CT evidence for acute  infarction.  Vascular: No hyperdense vessel or unexpected calcification.  Skull: No evidence for fracture. No worrisome lytic or sclerotic lesion.  Sinuses/Orbits: The visualized paranasal sinuses and mastoid air cells are clear. Visualized portions of the globes and intraorbital fat are unremarkable.  Other: None.  IMPRESSION: No acute intracranial abnormality.  Electronically Signed   By: Donnal Fusi M.D.   On: 05/19/2023 12:12 Note: Reviewed        Physical Exam  General appearance: Well nourished, well developed, and well hydrated. In no apparent acute distress Mental status: Alert, oriented x 3 (person, place, & time)       Respiratory: No evidence of acute respiratory distress Eyes: PERLA Vitals: BP 134/80   Pulse 99   Temp (!) 97.4 F (36.3 C)   Ht 5\' 5"  (1.651 m)   Wt 195 lb (88.5 kg)   LMP 07/09/2015 (Exact Date)   SpO2 100%   BMI 32.45 kg/m  BMI: Estimated body mass index is 32.45 kg/m as calculated from the following:   Height as of this encounter: 5\' 5"  (1.651 m).   Weight as of this encounter: 195 lb (88.5 kg). Ideal: Ideal body weight: 57 kg (125 lb 10.6 oz) Adjusted ideal body weight: 69.6 kg (153 lb 6.4 oz)  Assessment   Diagnosis Status  1. Chronic pelvic pain in female   2. Chronic pain syndrome   3. Endometriosis   4. Cervical radiculitis  5. Medication management    Controlled Controlled Controlled   Updated Problems: Problem  Medication Management    Plan of Care  Problem-specific:  Assessment and Plan We will current continue on current medication regimen.  Prescribing drug monitoring (PDMP) consistent with current medication regimen.   Ms. VALLORIE CALABRO has a current medication list which includes the following long-term medication(s): amitriptyline , metoprolol  succinate, promethazine , and valsartan -hydrochlorothiazide .  Pharmacotherapy (Medications Ordered): Meds ordered this encounter  Medications   tapentadol  HCl  (NUCYNTA ) 75 MG tablet    Sig: Take 1 tablet (75 mg total) by mouth 2 (two) times daily as needed for moderate pain (pain score 4-6).    Dispense:  60 tablet    Refill:  0    Chronic pain syndrome   tapentadol  HCl (NUCYNTA ) 75 MG tablet    Sig: Take 1 tablet (75 mg total) by mouth 2 (two) times daily as needed for moderate pain (pain score 4-6).    Dispense:  60 tablet    Refill:  0    Chronic pain syndrome   tapentadol  HCl (NUCYNTA ) 75 MG tablet    Sig: Take 1 tablet (75 mg total) by mouth 2 (two) times daily as needed for moderate pain (pain score 4-6).    Dispense:  60 tablet    Refill:  0    Chronic pain syndrome   Orders:  No orders of the defined types were placed in this encounter.  Follow-up plan:   Return in about 3 months (around 12/31/2023) for (F2F), (MM), Marthe Slain NP.        Recent Visits Date Type Provider Dept  07/08/23 Office Visit Cephus Collin, MD [facility]  Showing recent visits within past 90 days and meeting all other requirements Today's Visits Date Type Provider Dept  09/30/23 Office Visit Corderro Koloski K, NP [facility]  Showing today's visits and meeting all other requirements Future Appointments No visits were found meeting these conditions. Showing future appointments within next 90 days and meeting all other requirements  I discussed the assessment and treatment plan with the patient. The patient was provided an opportunity to ask questions and all were answered. The patient agreed with the plan and demonstrated an understanding of the instructions.  Patient advised to call back or seek an in-person evaluation if the symptoms or condition worsens.  Duration of encounter: 30 minutes.  Total time on encounter, as per AMA guidelines included both the face-to-face and non-face-to-face time personally spent by the physician and/or other qualified health care professional(s) on the day of the encounter (includes time in activities  that require the physician or other qualified health care professional and does not include time in activities normally performed by clinical staff). Physician's time may include the following activities when performed: Preparing to see the patient (e.g., pre-charting review of records, searching for previously ordered imaging, lab work, and nerve conduction tests) Review of prior analgesic pharmacotherapies. Reviewing PMP Interpreting ordered tests (e.g., lab work, imaging, nerve conduction tests) Performing post-procedure evaluations, including interpretation of diagnostic procedures Obtaining and/or reviewing separately obtained history Performing a medically appropriate examination and/or evaluation Counseling and educating the patient/family/caregiver Ordering medications, tests, or procedures Referring and communicating with other health care professionals (when not separately reported) Documenting clinical information in the electronic or other health record Independently interpreting results (not separately reported) and communicating results to the patient/ family/caregiver Care coordination (not separately reported)  Note by: Tharun Cappella K Minetta Krisher, NP (TTS and AI technology used.  I apologize for any typographical errors that were not detected and corrected.) Date: 09/30/2023; Time: 1:50 PM

## 2023-09-30 NOTE — Progress Notes (Signed)
 Safety precautions to be maintained throughout the outpatient stay will include: orient to surroundings, keep bed in low position, maintain call bell within reach at all times, provide assistance with transfer out of bed and ambulation.   Nursing Pain Medication Assessment:  Safety precautions to be maintained throughout the outpatient stay will include: orient to surroundings, keep bed in low position, maintain call bell within reach at all times, provide assistance with transfer out of bed and ambulation.  Medication Inspection Compliance: Pill count conducted under aseptic conditions, in front of the patient. Neither the pills nor the bottle was removed from the patient's sight at any time. Once count was completed pills were immediately returned to the patient in their original bottle.  Medication:  nucynta  Pill/Patch Count:  14 of 60 pills remain Pill/Patch Appearance: Markings consistent with prescribed medication Bottle Appearance: Standard pharmacy container. Clearly labeled. Filled Date: 4 / 10 / 2025 Last Medication intake:  Today

## 2023-10-08 ENCOUNTER — Other Ambulatory Visit (HOSPITAL_BASED_OUTPATIENT_CLINIC_OR_DEPARTMENT_OTHER): Payer: Self-pay

## 2023-10-08 ENCOUNTER — Other Ambulatory Visit: Payer: Self-pay | Admitting: Family Medicine

## 2023-10-08 ENCOUNTER — Encounter (HOSPITAL_BASED_OUTPATIENT_CLINIC_OR_DEPARTMENT_OTHER): Payer: Self-pay

## 2023-10-08 DIAGNOSIS — I10 Essential (primary) hypertension: Secondary | ICD-10-CM

## 2023-10-08 NOTE — Telephone Encounter (Unsigned)
 Copied from CRM [id number]. Topic: Clinical - Medication Refill >> Oct 08, 2023  1:00 PM Ivette P wrote: Medication: valsartan -hydrochlorothiazide  (DIOVAN -HCT) 80-12.5 MG tablet  Has the patient contacted their pharmacy? Yes (Agent: If no, request that the patient contact the pharmacy for the refill. If patient does not wish to contact the pharmacy document the reason why and proceed with request.) (Agent: If yes, when and what did the pharmacy advise?)  This is the patient's preferred pharmacy:  [facility] [address] [place] [address] Phone: [phone] Fax: [phone]  Is this the correct pharmacy for this prescription? Yes If no, delete pharmacy and type the correct one.   Has the prescription been filled recently? Yes, 07/19/23  Is the patient out of the medication? Yes  Has the patient been seen for an appointment in the last year OR does the patient have an upcoming appointment? Yes, 08/05/2023  Can we respond through MyChart? Yes  Agent: Please be advised that Rx refills may take up to 3 business days. We ask that you follow-up with your pharmacy.

## 2023-10-09 ENCOUNTER — Other Ambulatory Visit (HOSPITAL_BASED_OUTPATIENT_CLINIC_OR_DEPARTMENT_OTHER): Payer: Self-pay

## 2023-10-11 ENCOUNTER — Other Ambulatory Visit (HOSPITAL_BASED_OUTPATIENT_CLINIC_OR_DEPARTMENT_OTHER): Payer: Self-pay

## 2023-10-11 MED ORDER — VALSARTAN-HYDROCHLOROTHIAZIDE 80-12.5 MG PO TABS
1.0000 | ORAL_TABLET | Freq: Every day | ORAL | 0 refills | Status: DC
  Filled 2023-10-11 – 2023-10-22 (×2): qty 30, 30d supply, fill #0

## 2023-10-11 NOTE — Telephone Encounter (Signed)
 Copied from CRM [id number]. Topic: Clinical - Medication Refill >> Oct 08, 2023  1:00 PM Ivette P wrote: Medication: valsartan -hydrochlorothiazide  (DIOVAN -HCT) 80-12.5 MG tablet  Has the patient contacted their pharmacy? Yes (Agent: If no, request that the patient contact the pharmacy for the refill. If patient does not wish to contact the pharmacy document the reason why and proceed with request.) (Agent: If yes, when and what did the pharmacy advise?)  This is the patient's preferred pharmacy:  [facility] [address] [place] [address] Phone: [phone] Fax: [phone]  Is this the correct pharmacy for this prescription? Yes If no, delete pharmacy and type the correct one.   Has the prescription been filled recently? Yes, 07/19/23  Is the patient out of the medication? Yes  Has the patient been seen for an appointment in the last year OR does the patient have an upcoming appointment? Yes, 08/05/2023  Can we respond through MyChart? Yes  Agent: Please be advised that Rx refills may take up to 3 business days. We ask that you follow-up with your pharmacy. >> Oct 08, 2023  2:50 PM Lizabeth Riggs wrote: Stacey Stanley did not understand why she needs an appointment to get medication refill since she had an appointment in March. She is out of this medication. I called the clinic and the CMA was with a patient. Please give [name] a call at [phone]. Thanks

## 2023-10-21 ENCOUNTER — Other Ambulatory Visit (HOSPITAL_BASED_OUTPATIENT_CLINIC_OR_DEPARTMENT_OTHER): Payer: Self-pay

## 2023-10-22 ENCOUNTER — Other Ambulatory Visit (HOSPITAL_BASED_OUTPATIENT_CLINIC_OR_DEPARTMENT_OTHER): Payer: Self-pay

## 2023-10-29 ENCOUNTER — Other Ambulatory Visit: Payer: Self-pay

## 2023-10-29 ENCOUNTER — Ambulatory Visit: Admitting: Family Medicine

## 2023-10-29 ENCOUNTER — Encounter: Payer: Self-pay | Admitting: Family Medicine

## 2023-10-29 ENCOUNTER — Other Ambulatory Visit (HOSPITAL_BASED_OUTPATIENT_CLINIC_OR_DEPARTMENT_OTHER): Payer: Self-pay

## 2023-10-29 VITALS — BP 122/82 | HR 97 | Resp 16 | Ht 65.0 in | Wt 206.0 lb

## 2023-10-29 DIAGNOSIS — R7303 Prediabetes: Secondary | ICD-10-CM

## 2023-10-29 DIAGNOSIS — I471 Supraventricular tachycardia, unspecified: Secondary | ICD-10-CM

## 2023-10-29 DIAGNOSIS — R102 Pelvic and perineal pain: Secondary | ICD-10-CM

## 2023-10-29 DIAGNOSIS — G8929 Other chronic pain: Secondary | ICD-10-CM

## 2023-10-29 DIAGNOSIS — I10 Essential (primary) hypertension: Secondary | ICD-10-CM | POA: Diagnosis not present

## 2023-10-29 DIAGNOSIS — G4700 Insomnia, unspecified: Secondary | ICD-10-CM

## 2023-10-29 DIAGNOSIS — F339 Major depressive disorder, recurrent, unspecified: Secondary | ICD-10-CM

## 2023-10-29 DIAGNOSIS — F411 Generalized anxiety disorder: Secondary | ICD-10-CM

## 2023-10-29 DIAGNOSIS — E559 Vitamin D deficiency, unspecified: Secondary | ICD-10-CM

## 2023-10-29 DIAGNOSIS — G2581 Restless legs syndrome: Secondary | ICD-10-CM | POA: Insufficient documentation

## 2023-10-29 DIAGNOSIS — K581 Irritable bowel syndrome with constipation: Secondary | ICD-10-CM

## 2023-10-29 DIAGNOSIS — E538 Deficiency of other specified B group vitamins: Secondary | ICD-10-CM

## 2023-10-29 DIAGNOSIS — R400 Somnolence: Secondary | ICD-10-CM

## 2023-10-29 DIAGNOSIS — E66811 Obesity, class 1: Secondary | ICD-10-CM

## 2023-10-29 DIAGNOSIS — Z13220 Encounter for screening for lipoid disorders: Secondary | ICD-10-CM

## 2023-10-29 MED ORDER — VALSARTAN-HYDROCHLOROTHIAZIDE 80-12.5 MG PO TABS
1.0000 | ORAL_TABLET | Freq: Every day | ORAL | 0 refills | Status: DC
  Filled 2023-10-29 – 2023-12-13 (×2): qty 90, 90d supply, fill #0

## 2023-10-29 MED ORDER — METOPROLOL SUCCINATE ER 25 MG PO TB24
25.0000 mg | ORAL_TABLET | Freq: Every day | ORAL | 0 refills | Status: DC
  Filled 2023-10-29: qty 90, 90d supply, fill #0

## 2023-10-29 MED ORDER — MODAFINIL 100 MG PO TABS
100.0000 mg | ORAL_TABLET | Freq: Every day | ORAL | 0 refills | Status: DC
  Filled 2023-10-29: qty 90, 90d supply, fill #0

## 2023-10-29 NOTE — Progress Notes (Signed)
 Name: Stacey Stanley   MRN: [id number]    DOB: April 13, 1976   Date:10/29/2023       Progress Note  Subjective  Chief Complaint  Chief Complaint  Patient presents with   Medical Management of Chronic Issues   Discussed the use of AI scribe software for clinical note transcription with the patient, who gave verbal consent to proceed.  History of Present Illness Stacey Stanley is a 48 year old female with hypertension and supraventricular tachycardia who presents with extreme daytime sleepiness.  She experiences extreme daytime sleepiness, particularly around 2 PM, which significantly affects her ability to function until her day ends at 5 PM. She last took Nuvigil  in 2018 for this issue but discontinued it due to chest pain, which she believes was unrelated to the medication. She almost fell asleep while driving home from work. No current chest pain is reported. She consumes excessive caffeine, including coffee, sodas, and energy drinks, to combat fatigue but finds it ineffective. Despite getting eight hours of sleep nightly, she still feels exhausted.  Her hypertension is currently managed with valsartan  and metoprolol  for SVT. She denies palpitation or chest pain at this time, but heart rate has been in the 90's during office visits.  She is reluctant to use antidepressants due to past withdrawal experiences with Cymbalta . 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 She consumes sugary beverages and needs to reduce them to manage her weight and prediabetes. 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 She has  IBS-C, for which she uses Trulance  and  MiraLAX to manage constipation. She is due for follow up with GI  No current symptoms of depression are reported. She is motivated but just tired. She has not been diagnosed with sleep apnea and is unsure if she snores, as her husband is asleep when she is. She has not worn her Apple watch in months, which could potentially track her sleep patterns.    Patient Active Problem List   Diagnosis Date Noted   Obesity (BMI 30.0-34.9) 05/05/2023   Chest pain of uncertain etiology 05/02/2023   History of TIA (transient ischemic attack) 02/05/2022   Hypercalcemia 11/13/2021   Vitamin D  deficiency 11/13/2021   Squamous cell carcinoma in situ of skin 08/10/2021   Major depression, recurrent, chronic (HCC) 08/08/2021   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 08/08/2021   Chronic diarrhea of unknown origin    Gastric erosion    Polyp of rectum    Medication management 11/11/2020   Chronic pain syndrome 12/28/2017   Opiate use 12/28/2017   Chronic idiopathic constipation 10/13/2017   Mass of left ovary 06/28/2017   Kidney stone on left side 06/28/2017   Cervical radiculitis 03/08/2017   Vitamin B12 deficiency 08/19/2016   Prediabetes 08/19/2016   Right thyroid  nodule 07/31/2015   Chronic pelvic pain in female 04/05/2015   GERD (gastroesophageal reflux disease) 04/05/2015   Insomnia, persistent 11/25/2014   Endometriosis 11/25/2014   Hypertension, benign 11/25/2014    Past Surgical History:  Procedure Laterality Date   ABDOMINAL HYSTERECTOMY  05/21/2016  COLONOSCOPY  2019   COLONOSCOPY WITH PROPOFOL  N/A 07/04/2021   Procedure: COLONOSCOPY WITH PROPOFOL ;  Surgeon: Selena Daily, MD;  Location: [facility] ENDOSCOPY;  Service: Gastroenterology;  Laterality: N/A;   DILATION AND CURETTAGE OF UTERUS     ESOPHAGOGASTRODUODENOSCOPY (EGD) WITH PROPOFOL  N/A 07/04/2021   Procedure: ESOPHAGOGASTRODUODENOSCOPY (EGD) WITH PROPOFOL ;  Surgeon: Selena Daily, MD;  Location: [facility] ENDOSCOPY;   Service: Gastroenterology;  Laterality: N/A;   EYE SURGERY  06/28/2013   right laser eye surgery - repair retina   HAND SURGERY Right    HEMORROIDECTOMY  04/04/2018   Dr. Arta Bihari    HYSTEROSCOPY WITH D & C N/A 07/19/2013   Procedure: DILATATION AND CURETTAGE Lorraine Roses;  Surgeon: Kandra Orn, MD;  Location: [facility] ORS;  Service: Gynecology;  Laterality: N/A;   INDUCED ABORTION     LAPAROSCOPY  1992   endometriosis   LAPAROSCOPY N/A 07/19/2013   Procedure: LAPAROSCOPY DIAGNOSTIC  with resection of endometriosis;  Surgeon: Kandra Orn, MD;  Location: [facility] ORS;  Service: Gynecology;  Laterality: N/A;   metatarsil   2003   left foot surgery    RECTAL SURGERY  08/15/2021    Family History  Problem Relation Age of Onset   Diabetes Mother    Hypertension Mother    Hypertension Father    Kidney disease Father    Stroke Maternal Grandmother    Leukemia Paternal Grandmother    Alzheimer's disease Paternal Grandfather    Heart disease Brother    Anesthesia problems Neg Hx    Hypotension Neg Hx    Malignant hyperthermia Neg Hx    Pseudochol deficiency Neg Hx     Social History   Tobacco Use   Smoking status: Former    Current packs/day: 0.50    Average packs/day: 0.5 packs/day for 29.9 years (15.0 ttl pk-yrs)    Types: Cigarettes    Start date: 11/25/1993   Smokeless tobacco: Never  Substance Use Topics   Alcohol use: No    Alcohol/week: 0.0 standard drinks of alcohol     Current Outpatient Medications:    acetaminophen  (TYLENOL ) 500 MG tablet, Take 500 mg by mouth every 6 (six) hours as needed for mild pain., Disp: , Rfl:    aspirin  81 MG chewable tablet, Chew 1 tablet (81 mg total) by mouth daily., Disp: 90 tablet, Rfl: 0   busPIRone  (BUSPAR ) 10 MG tablet, Take 1 tablet (10 mg total) by mouth 2 (two) times daily., Disp: 180 tablet, Rfl: 0   lubiprostone  (AMITIZA ) 24 MCG capsule, Take 1 capsule (24 mcg total) by mouth 2 (two) times daily with a meal., Disp: 60  capsule, Rfl: 11   metaxalone  (SKELAXIN ) 800 MG tablet, Take 1 tablet (800 mg total) by mouth 3 (three) times daily as needed for muscle spasms., Disp: 90 tablet, Rfl: 0   metoprolol  succinate (TOPROL -XL) 25 MG 24 hr tablet, Take 1 tablet (25 mg total) by mouth daily., Disp: 90 tablet, Rfl: 0   Omega-3 Fatty Acids (FISH OIL) 1000 MG CAPS, Take 2,000 mg by mouth daily., Disp: , Rfl:    ondansetron  (ZOFRAN ) 4 MG tablet, Take 1 tablet (4 mg total) by mouth every 8 (eight) hours as needed for nausea or vomiting., Disp: 30 tablet, Rfl: 1   promethazine  (PHENERGAN ) 25 MG tablet, Take 1 tablet (25 mg total) by mouth every 6 (six) hours as needed for nausea or vomiting., Disp: 14 tablet, Rfl: 0   tapentadol  HCl (NUCYNTA ) 75 MG tablet, Take 1 tablet (  75 mg total) by mouth 2 (two) times daily as needed for moderate pain (pain score 4-6)., Disp: 60 tablet, Rfl: 0   [START ON 11/08/2023] tapentadol  HCl (NUCYNTA ) 75 MG tablet, Take 1 tablet (75 mg total) by mouth 2 (two) times daily as needed for moderate pain (pain score 4-6)., Disp: 60 tablet, Rfl: 0   [START ON 12/08/2023] tapentadol  HCl (NUCYNTA ) 75 MG tablet, Take 1 tablet (75 mg total) by mouth 2 (two) times daily as needed for moderate pain (pain score 4-6)., Disp: 60 tablet, Rfl: 0   TRULANCE  3 MG TABS, Take 1 tablet (3 mg total) by mouth daily as needed (constipation)., Disp: 30 tablet, Rfl: 11   valsartan -hydrochlorothiazide  (DIOVAN -HCT) 80-12.5 MG tablet, Take 1 tablet by mouth daily., Disp: 30 tablet, Rfl: 0   amitriptyline  (ELAVIL ) 10 MG tablet, Take 1 tablet (10 mg total) by mouth at bedtime., Disp: 30 tablet, Rfl: 3   clindamycin  (CLEOCIN  T) 1 % lotion, Apply 1 application to affected area topically every morning. (Patient not taking: Reported on 10/29/2023), Disp: 60 mL, Rfl: 2   Dapsone 5 % topical gel, Apply 60 g topically daily. (Patient not taking: Reported on 10/29/2023), Disp: , Rfl:    imiquimod (ALDARA) 5 % cream, Apply 1 packet topically daily as  needed (warts). (Patient not taking: Reported on 10/29/2023), Disp: , Rfl:    mupirocin  ointment (BACTROBAN ) 2 %, Apply 1 Application topically to affected area 2 (two) to 3 (three) times daily. (Patient not taking: Reported on 10/29/2023), Disp: 22 g, Rfl: 1   Semaglutide -Weight Management (WEGOVY ) 1 MG/0.5ML SOAJ, Inject 1 mg into the skin once a week. (Patient not taking: Reported on 10/29/2023), Disp: 6 mL, Rfl: 0  Allergies  Allergen Reactions   Nuvigil  [Armodafinil ]     Chest pain    Ppd [Tuberculin Purified Protein Derivative]     I personally reviewed active problem list, medication list, allergies with the patient/caregiver today.   ROS  Ten systems reviewed and is negative except as mentioned in HPI    Objective Physical Exam VITALS: P- 97 CONSTITUTIONAL: Patient appears well-developed and well-nourished.  No distress. HEENT: Head atraumatic, normocephalic, neck supple. CARDIOVASCULAR: Normal rate, regular rhythm and normal heart sounds.  No murmur heard. No BLE edema. PULMONARY: Effort normal and breath sounds normal. No respiratory distress. ABDOMINAL: There is no tenderness or distention. MUSCULOSKELETAL: Normal gait. Without gross motor or sensory deficit. PSYCHIATRIC: Patient has a normal mood and affect. behavior is normal. Judgment and thought content normal.  Vitals:   10/29/23 1442  BP: 122/82  Pulse: 97  Resp: 16  SpO2: 100%  Weight: 206 lb (93.4 kg)  Height: 5\' 5"  (1.651 m)    Body mass index is 34.28 kg/m.    PHQ2/9:    10/29/2023    2:24 PM 08/05/2023    9:49 AM 07/08/2023    2:52 PM 05/05/2023    9:55 AM 04/21/2023    8:11 AM  Depression screen PHQ 2/9  Decreased Interest 0 0 0 0 0  Down, Depressed, Hopeless 0 0 0 0 0  PHQ - 2 Score 0 0 0 0 0  Altered sleeping  0  3 0  Tired, decreased energy  0  0 0  Change in appetite  0  0 0  Feeling bad or failure about yourself   0  0 0  Trouble concentrating  0  0 0  Moving slowly or fidgety/restless   0  0 0  Suicidal thoughts  0  0 0  PHQ-9 Score  0  3 0  Difficult doing work/chores  Not difficult at all   Not difficult at all    phq 9 is negative  Fall Risk:    10/29/2023    2:24 PM 09/30/2023    1:34 PM 07/08/2023    2:53 PM 05/05/2023    9:55 AM 04/21/2023    8:09 AM  Fall Risk   Falls in the past year? 0 0 0 0 0  Number falls in past yr: 0   0 0  Injury with Fall? 0   0 0  Risk for fall due to : No Fall Risks   No Fall Risks   Follow up Falls prevention discussed;Education provided;Falls evaluation completed   Falls prevention discussed       Assessment & Plan Daytime hypersomnia Experiences significant daytime sleepiness. ESS score 15. Differential includes sleep apnea. History of chest pain with Nuvigil . Provigil  prescribed with caution. - Order sleep study for sleep apnea evaluation. - Prescribe Provigil  for hypersomnia and to also assist with depression symptoms,  monitor for chest pain. - Advise to discontinue Provigil  if chest pain occurs.  Hypertension/SVT Blood pressure well-controlled. Concern for increase with Provigil . Heart rate 97 bpm, supraventricular tachycardia present. - Monitor blood pressure and heart rate with Provigil . - Advise wearing watch to monitor heart rate. - Adjust metoprolol  if heart rate >100 bpm or increase Valsartan  if bp goes up but not heart rate  Supraventricular tachycardia Heart rate 97 bpm. Potential increase with Provigil . - Monitor heart rate and adjust metoprolol  as needed.  Obesity BMI increasing. Declined Wegovy . Advised to reduce sweet beverages. - Advise reducing sweet beverages, including coffee, sodas, energy drinks. - Recommend gradual caffeine reduction, starting with eliminating energy drinks.  Prediabetes Previous hemoglobin A1c 6.1%. - Order hemoglobin A1c to reassess. - Advise on dietary sugar reduction.  Depression major recurrent and GAD Denies symptoms. Not on antidepressants due to past withdrawal.  Provigil  used for augmentation. - Use Provigil  for depression augmentation. - Continue Buspar  for anxiety as needed.  Chronic pelvic pain Pain likely related to past cancer treatment. - Continue current pain management with Nucynta .  Irritable bowel syndrome with constipation (IBS-C) Symptoms managed with Trulance  and MiraLAX. - Continue Trulance  and MiraLAX. - Advise follow-up with GI specialist as needed.  General Health Maintenance Taking vitamin D  and B12. Previous B12 low normal. - Continue vitamin D  and B12. - Order CBC, comprehensive metabolic panel, and B12 level.  Follow-up Prefers longer intervals between appointments. - Schedule follow-up in three months. - Advise return sooner if blood pressure increases.

## 2023-11-01 ENCOUNTER — Other Ambulatory Visit (HOSPITAL_BASED_OUTPATIENT_CLINIC_OR_DEPARTMENT_OTHER): Payer: Self-pay

## 2023-11-08 ENCOUNTER — Other Ambulatory Visit (HOSPITAL_BASED_OUTPATIENT_CLINIC_OR_DEPARTMENT_OTHER): Payer: Self-pay

## 2023-11-25 DIAGNOSIS — L905 Scar conditions and fibrosis of skin: Secondary | ICD-10-CM | POA: Diagnosis not present

## 2023-11-25 DIAGNOSIS — L821 Other seborrheic keratosis: Secondary | ICD-10-CM | POA: Diagnosis not present

## 2023-11-25 DIAGNOSIS — L853 Xerosis cutis: Secondary | ICD-10-CM | POA: Diagnosis not present

## 2023-11-25 DIAGNOSIS — D225 Melanocytic nevi of trunk: Secondary | ICD-10-CM | POA: Diagnosis not present

## 2023-11-27 ENCOUNTER — Encounter: Payer: Self-pay | Admitting: Family Medicine

## 2023-12-07 ENCOUNTER — Other Ambulatory Visit (HOSPITAL_BASED_OUTPATIENT_CLINIC_OR_DEPARTMENT_OTHER): Payer: Self-pay

## 2023-12-08 ENCOUNTER — Other Ambulatory Visit (HOSPITAL_BASED_OUTPATIENT_CLINIC_OR_DEPARTMENT_OTHER): Payer: Self-pay

## 2023-12-13 ENCOUNTER — Other Ambulatory Visit (HOSPITAL_BASED_OUTPATIENT_CLINIC_OR_DEPARTMENT_OTHER): Payer: Self-pay

## 2023-12-13 ENCOUNTER — Encounter: Payer: Self-pay | Admitting: Family Medicine

## 2023-12-13 ENCOUNTER — Ambulatory Visit: Admitting: Family Medicine

## 2023-12-13 DIAGNOSIS — I10 Essential (primary) hypertension: Secondary | ICD-10-CM | POA: Diagnosis not present

## 2023-12-13 DIAGNOSIS — R7303 Prediabetes: Secondary | ICD-10-CM | POA: Diagnosis not present

## 2023-12-13 DIAGNOSIS — M797 Fibromyalgia: Secondary | ICD-10-CM | POA: Diagnosis not present

## 2023-12-13 MED ORDER — TIRZEPATIDE-WEIGHT MANAGEMENT 2.5 MG/0.5ML ~~LOC~~ SOLN: 2.5000 mg | SUBCUTANEOUS | 0 refills | Status: DC

## 2023-12-13 MED ORDER — PREGABALIN 25 MG PO CAPS
25.0000 mg | ORAL_CAPSULE | Freq: Every day | ORAL | 0 refills | Status: DC
  Filled 2023-12-13: qty 60, 30d supply, fill #0

## 2023-12-13 NOTE — Progress Notes (Signed)
 Name: Stacey Stanley   MRN: [id number]    DOB: 03/07/1976   Date:12/13/2023       Progress Note  Subjective  Chief Complaint  Chief Complaint  Patient presents with   Weight Loss    Interested in zepbound    Fibromyalgia    Wanting pregabalin     Discussed the use of AI scribe software for clinical note transcription with the patient, who gave verbal consent to proceed.  History of Present Illness Stacey Stanley is a 48 year old female with obesity, hypertension, and prediabetes who presents for a consultation regarding Zepbound  and fibromyalgia management.  She is seeking consultation regarding the use of Zepbound  for obesity management. She reports a history of hypertension and prediabetes. She previously used GLP-1 agonists such as Ozempic  and Wegovy  but discontinued due to insurance issues. She is interested in obtaining Zepbound  through the [facility] program for a discounted price. She has a history of weight gain, with her current weight at 211 pounds, up from 199 pounds in March. Her highest recorded weight was 220 pounds. She has been attempting portion control but finds it frustrating as she is not losing weight.  She reports worsening fibromyalgia symptoms, describing generalized body aches that began around her 48th birthday. She has previously used gabapentin , which caused somnolence, impacting her ability to work. She has not used Lyrica  before. Her fibromyalgia symptoms are affecting her sleep and daily functioning.  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    Patient Active Problem List   Diagnosis Date Noted   Daytime somnolence 10/29/2023   GAD (generalized anxiety disorder) 10/29/2023   RLS (restless legs syndrome) 10/29/2023   SVT (supraventricular tachycardia) (HCC) 10/29/2023   Obesity (BMI 30.0-34.9) 05/05/2023   Chest pain of uncertain etiology  05/02/2023   History of TIA (transient ischemic attack) 02/05/2022   Hypercalcemia 11/13/2021   Vitamin D  deficiency 11/13/2021   Squamous cell carcinoma in situ of skin 08/10/2021   Major depression, recurrent, chronic (HCC) 08/08/2021   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 08/08/2021   Chronic diarrhea of unknown origin    Gastric erosion    Polyp of rectum    Irritable bowel syndrome with constipation 11/11/2020   Medication management 11/11/2020   Chronic pain syndrome 12/28/2017   Opiate use 12/28/2017   Chronic idiopathic constipation 10/13/2017   Mass of left ovary 06/28/2017   Kidney stone on left side 06/28/2017   Cervical radiculitis 03/08/2017   Vitamin B12 deficiency 08/19/2016   Prediabetes 08/19/2016   Right thyroid  nodule 07/31/2015   Chronic pelvic pain in female 04/05/2015   GERD (gastroesophageal reflux disease) 04/05/2015   Insomnia, persistent 11/25/2014   Endometriosis 11/25/2014   Hypertension, benign 11/25/2014   Dysmetabolic syndrome 11/25/2014    Social History   Tobacco Use   Smoking status: Former    Current packs/day: 0.50    Average packs/day: 0.5 packs/day for 30.0 years (15.0 ttl pk-yrs)    Types: Cigarettes    Start date: 11/25/1993   Smokeless tobacco: Never  Substance Use Topics   Alcohol use: No    Alcohol/week: 0.0 standard drinks of alcohol     Current Outpatient Medications:    acetaminophen  (TYLENOL ) 500 MG tablet, Take 500 mg by mouth every 6 (six) hours as needed for mild pain., Disp: , Rfl:    aspirin  81 MG chewable tablet, Chew 1 tablet (81 mg  total) by mouth daily., Disp: 90 tablet, Rfl: 0   busPIRone  (BUSPAR ) 10 MG tablet, Take 1 tablet (10 mg total) by mouth 2 (two) times daily., Disp: 180 tablet, Rfl: 0   lubiprostone  (AMITIZA ) 24 MCG capsule, Take 1 capsule (24 mcg total) by mouth 2 (two) times daily with a meal., Disp: 60 capsule, Rfl: 11   metaxalone  (SKELAXIN ) 800 MG tablet, Take 1 tablet (800 mg total) by mouth 3  (three) times daily as needed for muscle spasms., Disp: 90 tablet, Rfl: 0   metoprolol  succinate (TOPROL -XL) 25 MG 24 hr tablet, Take 1 tablet (25 mg total) by mouth daily., Disp: 90 tablet, Rfl: 0   modafinil  (PROVIGIL ) 100 MG tablet, Take 1 tablet (100 mg total) by mouth daily., Disp: 90 tablet, Rfl: 0   Omega-3 Fatty Acids (FISH OIL) 1000 MG CAPS, Take 2,000 mg by mouth daily., Disp: , Rfl:    ondansetron  (ZOFRAN ) 4 MG tablet, Take 1 tablet (4 mg total) by mouth every 8 (eight) hours as needed for nausea or vomiting., Disp: 30 tablet, Rfl: 1   pregabalin  (LYRICA ) 25 MG capsule, Take 1-2 capsules (25-50 mg total) by mouth at bedtime., Disp: 60 capsule, Rfl: 0   promethazine  (PHENERGAN ) 25 MG tablet, Take 1 tablet (25 mg total) by mouth every 6 (six) hours as needed for nausea or vomiting., Disp: 14 tablet, Rfl: 0   tapentadol  HCl (NUCYNTA ) 75 MG tablet, Take 1 tablet (75 mg total) by mouth 2 (two) times daily as needed for moderate pain (pain score 4-6)., Disp: 60 tablet, Rfl: 0   tirzepatide  (ZEPBOUND ) 2.5 MG/0.5ML injection vial, Inject 2.5 mg into the skin once a week., Disp: 0.5 mL, Rfl: 0   TRULANCE  3 MG TABS, Take 1 tablet (3 mg total) by mouth daily as needed (constipation)., Disp: 30 tablet, Rfl: 11   valsartan -hydrochlorothiazide  (DIOVAN -HCT) 80-12.5 MG tablet, Take 1 tablet by mouth daily., Disp: 90 tablet, Rfl: 0  Allergies  Allergen Reactions   Ppd [Tuberculin Purified Protein Derivative]     ROS  Ten systems reviewed and is negative except as mentioned in HPI    Objective  Vitals:   12/13/23 1526  BP: 124/84  Pulse: 84  Resp: 16  SpO2: 100%  Weight: 211 lb 12.8 oz (96.1 kg)  Height: 5' 5 (1.651 m)    Body mass index is 35.25 kg/m. Physical Exam CONSTITUTIONAL: Patient appears well-developed and well-nourished.  No distress. HEENT: Head atraumatic, normocephalic, neck supple. CARDIOVASCULAR: Normal rate, regular rhythm and normal heart sounds.  No murmur heard. No  BLE edema. PULMONARY: Effort normal and breath sounds normal. No respiratory distress. ABDOMINAL: There is no tenderness or distention. MUSCULOSKELETAL: Normal gait. Without gross motor or sensory deficit. Trigger point positive  PSYCHIATRIC: Patient has a normal mood and affect. behavior is normal. Judgment and thought content normal.   Assessment & Plan Morbid obesity (BMI =35) with comorbidities Morbid obesity with BMI 35.25, hypertension, and prediabetes. She is interested in Zepbound  for weight management and understands titration and side effects. - Prescribed Zepbound  2.5 mg weekly, titrate as needed. - Sent prescription to [facility] for self-pay. - Advised on portion control and Zepbound  side effects, including nausea. - Instructed to remind pharmacy to send prescriptions to [facility] for promotional pricing.  Fibromyalgia Chronic fibromyalgia with widespread pain. Gabapentin  was ineffective due to sedation. Considering pregabalin  for pain management despite potential weight gain. - Prescribed pregabalin  25 mg at night, titrate to 50 mg after one week  if tolerated. - Sent prescription to [facility] pharmacy. - Monitor for weight gain and effectiveness of pregabalin .

## 2023-12-14 ENCOUNTER — Other Ambulatory Visit: Payer: Self-pay

## 2023-12-14 ENCOUNTER — Other Ambulatory Visit (HOSPITAL_BASED_OUTPATIENT_CLINIC_OR_DEPARTMENT_OTHER): Payer: Self-pay

## 2023-12-14 DIAGNOSIS — I10 Essential (primary) hypertension: Secondary | ICD-10-CM

## 2023-12-14 DIAGNOSIS — R7303 Prediabetes: Secondary | ICD-10-CM

## 2023-12-14 MED ORDER — TIRZEPATIDE-WEIGHT MANAGEMENT 2.5 MG/0.5ML ~~LOC~~ SOLN: 2.5000 mg | SUBCUTANEOUS | 0 refills | Status: DC

## 2023-12-18 ENCOUNTER — Encounter: Payer: Self-pay | Admitting: Family Medicine

## 2023-12-20 ENCOUNTER — Other Ambulatory Visit (HOSPITAL_BASED_OUTPATIENT_CLINIC_OR_DEPARTMENT_OTHER): Payer: Self-pay

## 2023-12-20 ENCOUNTER — Other Ambulatory Visit: Payer: Self-pay | Admitting: Family Medicine

## 2023-12-20 ENCOUNTER — Encounter: Payer: Self-pay | Admitting: Family Medicine

## 2023-12-20 DIAGNOSIS — M797 Fibromyalgia: Secondary | ICD-10-CM

## 2023-12-20 DIAGNOSIS — G894 Chronic pain syndrome: Secondary | ICD-10-CM

## 2023-12-20 MED ORDER — GABAPENTIN 100 MG PO CAPS
100.0000 mg | ORAL_CAPSULE | Freq: Three times a day (TID) | ORAL | 0 refills | Status: DC
  Filled 2023-12-20: qty 90, 30d supply, fill #0

## 2023-12-22 ENCOUNTER — Other Ambulatory Visit (HOSPITAL_BASED_OUTPATIENT_CLINIC_OR_DEPARTMENT_OTHER): Payer: Self-pay

## 2023-12-22 ENCOUNTER — Encounter: Payer: Self-pay | Admitting: Nurse Practitioner

## 2023-12-22 ENCOUNTER — Ambulatory Visit: Attending: Nurse Practitioner | Admitting: Nurse Practitioner

## 2023-12-22 VITALS — BP 112/85 | HR 100 | Temp 97.5°F | Ht 65.0 in | Wt 211.0 lb

## 2023-12-22 DIAGNOSIS — M5412 Radiculopathy, cervical region: Secondary | ICD-10-CM | POA: Diagnosis not present

## 2023-12-22 DIAGNOSIS — Z79899 Other long term (current) drug therapy: Secondary | ICD-10-CM | POA: Diagnosis not present

## 2023-12-22 DIAGNOSIS — G8929 Other chronic pain: Secondary | ICD-10-CM | POA: Insufficient documentation

## 2023-12-22 DIAGNOSIS — G894 Chronic pain syndrome: Secondary | ICD-10-CM | POA: Insufficient documentation

## 2023-12-22 DIAGNOSIS — N809 Endometriosis, unspecified: Secondary | ICD-10-CM | POA: Diagnosis not present

## 2023-12-22 DIAGNOSIS — R102 Pelvic and perineal pain: Secondary | ICD-10-CM | POA: Insufficient documentation

## 2023-12-22 MED ORDER — OXYCODONE-ACETAMINOPHEN 10-325 MG PO TABS
1.0000 | ORAL_TABLET | Freq: Four times a day (QID) | ORAL | 0 refills | Status: AC | PRN
  Filled 2024-02-04 – 2024-02-05 (×2): qty 120, 30d supply, fill #0
  Filled ????-??-?? (×2): fill #0

## 2023-12-22 MED ORDER — OXYCODONE-ACETAMINOPHEN 10-325 MG PO TABS
1.0000 | ORAL_TABLET | Freq: Four times a day (QID) | ORAL | 0 refills | Status: DC | PRN
  Filled 2024-03-07: qty 120, 30d supply, fill #0

## 2023-12-22 MED ORDER — NALOXONE HCL 4 MG/0.1ML NA LIQD
1.0000 | NASAL | 1 refills | Status: AC | PRN
  Filled 2023-12-22: qty 2, 1d supply, fill #0

## 2023-12-22 MED ORDER — OXYCODONE-ACETAMINOPHEN 10-325 MG PO TABS
1.0000 | ORAL_TABLET | Freq: Four times a day (QID) | ORAL | 0 refills | Status: AC | PRN
  Filled 2024-01-07 (×2): qty 120, 30d supply, fill #0

## 2023-12-22 NOTE — Progress Notes (Signed)
 PROVIDER NOTE: Interpretation of information contained herein should be left to medically-trained personnel. Specific patient instructions are provided elsewhere under Patient Instructions section of medical record. This document was created in part using AI and STT-dictation technology, any transcriptional errors that may result from this process are unintentional.  Patient: Stacey Stanley  Service: E/M   PCP: Sowles, Krichna, MD  DOB: Jun 26, 1975  DOS: 12/22/2023  Provider: Emmy MARLA Blanch, NP  MRN: [id number]  Delivery: Face-to-face  Specialty: Interventional Pain Management  Type: Established Patient  Setting: Ambulatory outpatient facility  Specialty designation: 09  Referring Prov.: Sowles, Krichna, MD  Location: Outpatient office facility       History of present illness (HPI) Stacey Stanley, a 48 y.o. year old female, is here today because of her Chronic pain syndrome [G89.4]. Stacey Stanley primary complain today is Hip Pain (Both/)  Pertinent problems: Stacey Stanley has Endometriosis; Chronic pelvic pain in female; Cervical radiculitis; Opioid use; and Chronic pain syndrome on their pertinent problem list.  Pain Assessment: Severity of Chronic pain is reported as a 5 /10. Location: Pelvis Left, Right/pain radiaties toward her rectal area. Onset: More than a month ago. Quality: [name], Shooting, Radiating, Aching, Discomfort. Timing: Constant. Modifying factor(s): Meds and laying. Vitals:  height is 5' 5 (1.651 m) and weight is 211 lb (95.7 kg). Her temperature is 97.5 F (36.4 C) (abnormal). Her blood pressure is 112/85 and her pulse is 100. Her oxygen saturation is 100%.  BMI: Estimated body mass index is 35.11 kg/m as calculated from the following:   Height as of this encounter: 5' 5 (1.651 m).   Weight as of this encounter: 211 lb (95.7 kg).  Last encounter: 09/30/2023. Last procedure: Visit date not found.  Reason for encounter: medication management. No change in medical  history since last visit.  Patient's pain is at baseline.  Patient continues multimodal pain regimen as prescribed.  States that it provides pain relief and improvement in functional status; however the patient expressed concern about the cost of Nucynta , stating that she can no longer afford it.  She requested to switch to an alternative therapy with an equivalent MME. We discussed starting Norco; however the patient is concerned that alternative therapies may not adequately control her pain.  She prefers to switch to a more potent medication that provides effective pain relief. Pharmacotherapy Assessment   Started Oxycodone -acetaminophen  (Percocet) 10-325 mg tablet every 6 hours as needed for pain.  discontinue medication (Nucynta  MME=60)  Monitoring: [place] PMP: PDMP reviewed during this encounter.       Pharmacotherapy: No side-effects or adverse reactions reported. Compliance: No problems identified. Effectiveness: Clinically acceptable.  Delores Dorothe LABOR, RN  12/22/2023  8:30 AM  Sign when Signing Visit Nursing Pain Medication Assessment:  Safety precautions to be maintained throughout the outpatient stay will include: orient to surroundings, keep bed in low position, maintain call bell within reach at all times, provide assistance with transfer out of bed and ambulation.  Medication Inspection Compliance: Pill count conducted under aseptic conditions, in front of the patient. Neither the pills nor the bottle was removed from the patient's sight at any time. Once count was completed pills were immediately returned to the patient in their original bottle.  Medication: Tapentadol  (Nucynta ) Pill/Patch Count: 30 of 60 pills/patches remain Pill/Patch Appearance: Markings consistent with prescribed medication Bottle Appearance: Standard pharmacy container. Clearly labeled. Filled Date: 7 / 23 / 2025 Last Medication intake:  TodaySafety precautions to be maintained throughout the outpatient  stay will  include: orient to surroundings, keep bed in low position, maintain call bell within reach at all times, provide assistance with transfer out of bed and ambulation.     UDS:  Summary  Date Value Ref Range Status  01/12/2023 Note  Final    Comment:    ==================================================================== [employer] [facility] 13 (MW) ==================================================================== Test                             Result       Flag       Units  Drug Present and Declared for Prescription Verification   Tapentadol                      >6579        EXPECTED   ng/mg creat    Source of tapentadol  is a scheduled prescription medication.  ==================================================================== Test                      Result    Flag   Units      Ref Range   Creatinine              152              mg/dL      >=79 ==================================================================== Declared Medications:  The flagging and interpretation on this report are based on the  following declared medications.  Unexpected results may arise from  inaccuracies in the declared medications.   **Note: The testing scope of this panel includes these medications:   Tapentadol  (Nucynta )   **Note: The testing scope of this panel does not include the  following reported medications:   Acetaminophen  (Tylenol )  Aspirin   Buspirone  (Buspar )  Clindamycin  (Cleocin )  Dapsone  Fish Oil  Hydrocortisone  Hydroxyzine  (Vistaril )  Ibuprofen  (Advil )  Imiquimod (Aldara)  Meloxicam  (Mobic )  Methylprednisolone  (Medrol )  Metoprolol  (Toprol )  Mupirocin  (Bactroban )  Naproxen  (Naprosyn )  Omeprazole  (Prilosec)  Plecanatide  (Trulance )  Semaglutide  (Wegovy )  Topical  Tretinoin  Valsartan  (Diovan ) ==================================================================== For clinical consultation, please call [phone]. ====================================================================     No results found for: CBDTHCR No results found for: D8THCCBX No results found for: D9THCCBX  ROS  Constitutional: Denies any fever or chills Gastrointestinal: No reported hemesis, hematochezia, vomiting, or acute GI distress Musculoskeletal: Bilateral hip Neurological: No reported episodes of acute onset apraxia, aphasia, dysarthria, agnosia, amnesia, paralysis, loss of coordination, or loss of consciousness  Medication Review  Fish Oil, Plecanatide , acetaminophen , aspirin , busPIRone , gabapentin , lubiprostone , metaxalone , metoprolol  succinate, modafinil , naloxone , ondansetron , oxyCODONE -acetaminophen , promethazine , tirzepatide , and valsartan -hydrochlorothiazide   History Review  Allergy: Stacey Stanley is allergic to ppd [tuberculin purified protein derivative]. Drug: Stacey Stanley  reports no history of drug use. Alcohol:  reports no history of alcohol use. Tobacco:  reports that she has quit smoking. Her smoking use included cigarettes. She started smoking about 30 years ago. She has a 15 pack-year smoking history. She has never used smokeless tobacco. Social: Stacey Stanley  reports that she has quit smoking. Her smoking use included cigarettes. She started smoking about 30 years ago. She has a 15 pack-year smoking history. She has never used smokeless tobacco. She reports that she does not drink alcohol and does not use drugs. Medical:  has a past medical history of Anxiety, Arthritis, Chest pain of uncertain etiology (05/02/2023), Chronic insomnia, Depression, Diabetes mellitus without complication (HCC), Dysrhythmia, Endometriosis, GERD (gastroesophageal reflux disease), Gout, Headache(784.0), History of  kidney stones, Hypertension, Metabolic syndrome, Obesity, Right thyroid  nodule (2017), Serum calcium elevated, Sleep disorder, circadian, shift work type, SVD (spontaneous vaginal delivery), Tachycardia,  and TIA (transient ischemic attack). Surgical: Stacey Stanley  has a past surgical history that includes metatarsil  (2003); Dilation and curettage of uterus; Induced abortion; laparoscopy (1992); Eye surgery (06/28/2013); laparoscopy (N/A, 07/19/2013); Hysteroscopy with D & C (N/A, 07/19/2013); Abdominal hysterectomy (05/21/2016); Hemorroidectomy (04/04/2018); Colonoscopy (2019); Colonoscopy with propofol  (N/A, 07/04/2021); Esophagogastroduodenoscopy (egd) with propofol  (N/A, 07/04/2021); Rectal surgery (08/15/2021); and Hand surgery (Right). Family: family history includes Alzheimer's disease in her paternal grandfather; Diabetes in her mother; Heart disease in her brother; Hypertension in her father and mother; Kidney disease in her father; Leukemia in her paternal grandmother; Stroke in her maternal grandmother.  Laboratory Chemistry Profile   Renal Lab Results  Component Value Date   BUN 14 05/19/2023   CREATININE 0.87 05/19/2023   BCR SEE NOTE: 08/13/2022   GFRAA 108 11/28/2020   GFRNONAA >60 05/19/2023    Hepatic Lab Results  Component Value Date   AST 11 08/13/2022   ALT 9 08/13/2022   ALBUMIN 4.1 05/17/2022   ALKPHOS 52 05/17/2022   AMYLASE 102 (H) 05/22/2016   LIPASE 13 04/01/2022    Electrolytes Lab Results  Component Value Date   NA 141 05/19/2023   K 3.9 05/19/2023   CL 106 05/19/2023   CALCIUM 9.6 05/19/2023   MG 1.7 12/13/2022    Bone Lab Results  Component Value Date   VD25OH 37 08/13/2022    Inflammation (CRP: Acute Phase) (ESR: Chronic Phase) Lab Results  Component Value Date   CRP 0.2 05/06/2020   ESRSEDRATE 2 05/06/2020         Note: Above Lab results reviewed.  Recent Imaging Review  CT Head Wo Contrast CLINICAL DATA:  Worsening headache for 4 days.  EXAM: CT HEAD WITHOUT CONTRAST  TECHNIQUE: Contiguous axial images were obtained from the base of the skull through the vertex without intravenous contrast.  RADIATION DOSE REDUCTION: This exam  was performed according to the departmental dose-optimization program which includes automated exposure control, adjustment of the mA and/or kV according to patient size and/or use of iterative reconstruction technique.  COMPARISON:  05/17/2022  FINDINGS: Brain: There is no evidence for acute hemorrhage, hydrocephalus, mass lesion, or abnormal extra-axial fluid collection. No definite CT evidence for acute infarction.  Vascular: No hyperdense vessel or unexpected calcification.  Skull: No evidence for fracture. No worrisome lytic or sclerotic lesion.  Sinuses/Orbits: The visualized paranasal sinuses and mastoid air cells are clear. Visualized portions of the globes and intraorbital fat are unremarkable.  Other: None.  IMPRESSION: No acute intracranial abnormality.  Electronically Signed   By: Camellia Candle M.D.   On: 05/19/2023 12:12 Note: Reviewed        Physical Exam  Vitals: BP 112/85   Pulse 100   Temp (!) 97.5 F (36.4 C)   Ht 5' 5 (1.651 m)   Wt 211 lb (95.7 kg)   LMP 07/09/2015 (Exact Date)   SpO2 100%   BMI 35.11 kg/m  BMI: Estimated body mass index is 35.11 kg/m as calculated from the following:   Height as of this encounter: 5' 5 (1.651 m).   Weight as of this encounter: 211 lb (95.7 kg). Ideal: Ideal body weight: 57 kg (125 lb 10.6 oz) Adjusted ideal body weight: 72.5 kg (159 lb 12.8 oz) General appearance: Well nourished, well developed, and well hydrated. In no apparent acute distress Mental  status: Alert, oriented x 3 (person, place, & time)       Respiratory: No evidence of acute respiratory distress Eyes: PERLA   Assessment   Diagnosis Status  1. Chronic pain syndrome   2. Medication management   3. Long term prescription benzodiazepine use   4. Chronic pelvic pain in female   5. Endometriosis   6. Cervical radiculitis    Controlled Controlled Controlled   Updated Problems: No problems updated.  Plan of Care  Problem-specific:   Assessment and Plan Discontinue Nucynta  75 Mg 2 times daily as needed pain  Started Percocet 10-325 mg tablet every 6 hours as needed for pain. Prescribing drug monitoring (PDMP) reviewed; findings consistent with the use of prescribed medication and no evidence of other narcotic misuse or abuse.  Urine drug screening (UDS) up-to-date.  Schedule follow-up in 90 days for medication management.   Stacey Stanley has a current medication list which includes the following long-term medication(s): gabapentin , metoprolol  succinate, modafinil , promethazine , and valsartan -hydrochlorothiazide .  Pharmacotherapy (Medications Ordered): Meds ordered this encounter  Medications   oxyCODONE -acetaminophen  (PERCOCET) 10-325 MG tablet    Sig: Take 1 tablet by mouth every 6 (six) hours as needed for pain. Must last 30 days.    Dispense:  120 tablet    Refill:  0    Chronic Pain: STOP Act (Not applicable) Fill 1 day early if closed on refill date. Avoid benzodiazepines within 8 hours of opioids   oxyCODONE -acetaminophen  (PERCOCET) 10-325 MG tablet    Sig: Take 1 tablet by mouth every 6 (six) hours as needed for pain. Must last 30 days.    Dispense:  120 tablet    Refill:  0    Chronic Pain: STOP Act (Not applicable) Fill 1 day early if closed on refill date. Avoid benzodiazepines within 8 hours of opioids   oxyCODONE -acetaminophen  (PERCOCET) 10-325 MG tablet    Sig: Take 1 tablet by mouth every 6 (six) hours as needed for pain. Must last 30 days.    Dispense:  120 tablet    Refill:  0    Chronic Pain: STOP Act (Not applicable) Fill 1 day early if closed on refill date. Avoid benzodiazepines within 8 hours of opioids   naloxone  (NARCAN ) nasal spray 4 mg/0.1 mL    Sig: In case of emergency (overdose), spray once into each nostril. If no response within 3 minutes, repeat application and call 911.    Dispense:  1 each    Refill:  1    Instruct patient in proper use of device.   Orders:  No orders  of the defined types were placed in this encounter.       Return in about 3 months (around 03/23/2024) for (F2F), (MM), Emmy Blanch NP.    Recent Visits Date Type Provider Dept  09/30/23 Office Visit Jayley Hustead K, NP [facility]  Showing recent visits within past 90 days and meeting all other requirements Today's Visits Date Type Provider Dept  12/22/23 Office Visit Jayvian Escoe K, NP [facility]  Showing today's visits and meeting all other requirements Future Appointments No visits were found meeting these conditions. Showing future appointments within next 90 days and meeting all other requirements  I discussed the assessment and treatment plan with the patient. The patient was provided an opportunity to ask questions and all were answered. The patient agreed with the plan and demonstrated an understanding of the instructions.  Patient advised to call back or seek an  in-person evaluation if the symptoms or condition worsens.  Duration of encounter: 30 minutes.  Total time on encounter, as per AMA guidelines included both the face-to-face and non-face-to-face time personally spent by the physician and/or other qualified health care professional(s) on the day of the encounter (includes time in activities that require the physician or other qualified health care professional and does not include time in activities normally performed by clinical staff). Physician's time may include the following activities when performed: Preparing to see the patient (e.g., pre-charting review of records, searching for previously ordered imaging, lab work, and nerve conduction tests) Review of prior analgesic pharmacotherapies. Reviewing PMP Interpreting ordered tests (e.g., lab work, imaging, nerve conduction tests) Performing post-procedure evaluations, including interpretation of diagnostic procedures Obtaining and/or reviewing separately obtained history Performing a medically  appropriate examination and/or evaluation Counseling and educating the patient/family/caregiver Ordering medications, tests, or procedures Referring and communicating with other health care professionals (when not separately reported) Documenting clinical information in the electronic or other health record Independently interpreting results (not separately reported) and communicating results to the patient/ family/caregiver Care coordination (not separately reported)  Note by: Darrel Gloss K Deidre Carino, NP (TTS and AI technology used. I apologize for any typographical errors that were not detected and corrected.) Date: 12/22/2023; Time: 9:51 AM

## 2023-12-22 NOTE — Progress Notes (Signed)
 Nursing Pain Medication Assessment:  Safety precautions to be maintained throughout the outpatient stay will include: orient to surroundings, keep bed in low position, maintain call bell within reach at all times, provide assistance with transfer out of bed and ambulation.  Medication Inspection Compliance: Pill count conducted under aseptic conditions, in front of the patient. Neither the pills nor the bottle was removed from the patient's sight at any time. Once count was completed pills were immediately returned to the patient in their original bottle.  Medication: Tapentadol  (Nucynta ) Pill/Patch Count: 30 of 60 pills/patches remain Pill/Patch Appearance: Markings consistent with prescribed medication Bottle Appearance: Standard pharmacy container. Clearly labeled. Filled Date: 7 / 26 / 2025 Last Medication intake:  TodaySafety precautions to be maintained throughout the outpatient stay will include: orient to surroundings, keep bed in low position, maintain call bell within reach at all times, provide assistance with transfer out of bed and ambulation.

## 2023-12-30 ENCOUNTER — Encounter: Admitting: Nurse Practitioner

## 2024-01-03 ENCOUNTER — Encounter: Admitting: Nurse Practitioner

## 2024-01-03 ENCOUNTER — Other Ambulatory Visit (HOSPITAL_BASED_OUTPATIENT_CLINIC_OR_DEPARTMENT_OTHER): Payer: Self-pay

## 2024-01-03 ENCOUNTER — Encounter: Payer: Self-pay | Admitting: Family Medicine

## 2024-01-03 ENCOUNTER — Other Ambulatory Visit: Payer: Self-pay | Admitting: Family Medicine

## 2024-01-03 DIAGNOSIS — R7303 Prediabetes: Secondary | ICD-10-CM

## 2024-01-03 DIAGNOSIS — I10 Essential (primary) hypertension: Secondary | ICD-10-CM

## 2024-01-04 ENCOUNTER — Encounter: Payer: Self-pay | Admitting: Family Medicine

## 2024-01-04 ENCOUNTER — Other Ambulatory Visit: Payer: Self-pay

## 2024-01-04 DIAGNOSIS — R7303 Prediabetes: Secondary | ICD-10-CM

## 2024-01-04 DIAGNOSIS — I10 Essential (primary) hypertension: Secondary | ICD-10-CM

## 2024-01-04 MED ORDER — TIRZEPATIDE-WEIGHT MANAGEMENT 2.5 MG/0.5ML ~~LOC~~ SOLN
2.5000 mg | SUBCUTANEOUS | 0 refills | Status: DC
Start: 2024-01-04 — End: 2024-01-24

## 2024-01-06 ENCOUNTER — Other Ambulatory Visit (HOSPITAL_BASED_OUTPATIENT_CLINIC_OR_DEPARTMENT_OTHER): Payer: Self-pay

## 2024-01-07 ENCOUNTER — Other Ambulatory Visit (HOSPITAL_BASED_OUTPATIENT_CLINIC_OR_DEPARTMENT_OTHER): Payer: Self-pay

## 2024-01-12 ENCOUNTER — Other Ambulatory Visit (HOSPITAL_BASED_OUTPATIENT_CLINIC_OR_DEPARTMENT_OTHER): Payer: Self-pay

## 2024-01-19 ENCOUNTER — Ambulatory Visit (HOSPITAL_BASED_OUTPATIENT_CLINIC_OR_DEPARTMENT_OTHER): Admitting: Nurse Practitioner

## 2024-01-19 ENCOUNTER — Encounter: Payer: Self-pay | Admitting: Nurse Practitioner

## 2024-01-19 ENCOUNTER — Other Ambulatory Visit: Payer: Self-pay | Admitting: Family Medicine

## 2024-01-19 DIAGNOSIS — R7303 Prediabetes: Secondary | ICD-10-CM

## 2024-01-19 DIAGNOSIS — G894 Chronic pain syndrome: Secondary | ICD-10-CM

## 2024-01-19 DIAGNOSIS — I10 Essential (primary) hypertension: Secondary | ICD-10-CM

## 2024-01-19 DIAGNOSIS — Z91199 Patient's noncompliance with other medical treatment and regimen due to unspecified reason: Secondary | ICD-10-CM

## 2024-01-19 NOTE — Progress Notes (Signed)
 01/19/2024-No Show

## 2024-01-24 ENCOUNTER — Other Ambulatory Visit (HOSPITAL_BASED_OUTPATIENT_CLINIC_OR_DEPARTMENT_OTHER): Payer: Self-pay

## 2024-01-24 ENCOUNTER — Other Ambulatory Visit: Payer: Self-pay | Admitting: Family Medicine

## 2024-01-24 MED ORDER — TIRZEPATIDE-WEIGHT MANAGEMENT 5 MG/0.5ML ~~LOC~~ SOLN: 5.0000 mg | SUBCUTANEOUS | 0 refills | Status: DC

## 2024-01-24 MED ORDER — TIRZEPATIDE-WEIGHT MANAGEMENT 5 MG/0.5ML ~~LOC~~ SOLN
5.0000 mg | SUBCUTANEOUS | 0 refills | Status: DC
  Filled 2024-01-24: qty 2, 28d supply, fill #0

## 2024-01-26 ENCOUNTER — Other Ambulatory Visit: Payer: Self-pay | Admitting: Family Medicine

## 2024-01-26 DIAGNOSIS — I10 Essential (primary) hypertension: Secondary | ICD-10-CM

## 2024-01-26 DIAGNOSIS — R7303 Prediabetes: Secondary | ICD-10-CM

## 2024-01-27 ENCOUNTER — Encounter: Payer: Self-pay | Admitting: Nurse Practitioner

## 2024-01-27 ENCOUNTER — Other Ambulatory Visit: Payer: Self-pay

## 2024-01-27 ENCOUNTER — Other Ambulatory Visit (HOSPITAL_BASED_OUTPATIENT_CLINIC_OR_DEPARTMENT_OTHER): Payer: Self-pay

## 2024-01-27 ENCOUNTER — Ambulatory Visit: Attending: Nurse Practitioner | Admitting: Nurse Practitioner

## 2024-01-27 VITALS — BP 120/82 | HR 85 | Temp 97.4°F | Resp 18 | Ht 65.0 in | Wt 199.0 lb

## 2024-01-27 DIAGNOSIS — N809 Endometriosis, unspecified: Secondary | ICD-10-CM | POA: Insufficient documentation

## 2024-01-27 DIAGNOSIS — G894 Chronic pain syndrome: Secondary | ICD-10-CM | POA: Diagnosis not present

## 2024-01-27 DIAGNOSIS — R102 Pelvic and perineal pain: Secondary | ICD-10-CM | POA: Diagnosis not present

## 2024-01-27 DIAGNOSIS — Z79899 Other long term (current) drug therapy: Secondary | ICD-10-CM | POA: Insufficient documentation

## 2024-01-27 DIAGNOSIS — G8929 Other chronic pain: Secondary | ICD-10-CM | POA: Diagnosis present

## 2024-01-27 DIAGNOSIS — M5412 Radiculopathy, cervical region: Secondary | ICD-10-CM | POA: Insufficient documentation

## 2024-01-27 MED ORDER — BUPRENORPHINE 5 MCG/HR TD PTWK
1.0000 | MEDICATED_PATCH | TRANSDERMAL | 0 refills | Status: DC
  Filled 2024-01-27: qty 4, 28d supply, fill #0

## 2024-01-27 NOTE — Progress Notes (Signed)
 PROVIDER NOTE: Interpretation of information contained herein should be left to medically-trained personnel. Specific patient instructions are provided elsewhere under Patient Instructions section of medical record. This document was created in part using AI and STT-dictation technology, any transcriptional errors that may result from this process are unintentional.  Patient: Stacey Stanley  Service: E/M   PCP: Sowles, Krichna, MD  DOB: 11/15/1975  DOS: 01/27/2024  Provider: Emmy MARLA Blanch, NP  MRN: [id number]  Delivery: Face-to-face  Specialty: Interventional Pain Management  Type: Established Patient  Setting: Ambulatory outpatient facility  Specialty designation: 09  Referring Prov.: Sowles, Krichna, MD  Location: Outpatient office facility       History of present illness (HPI) [name], a 48 y.o. year old female, is here today because of her Chronic pain syndrome [G89.4]. [name]'s primary complain today is Pelvic Pain (Pelvic Pain and Rectal Pain /)  Pertinent problems: [name]  has Endometriosis; Chronic pelvic pain in female; Cervical radiculitis; Opioid use; and Chronic pain syndrome on their pertinent problem list.   Pain Assessment: Severity of Chronic pain is reported as a 6 /10. Location: Pelvis Lower/Pain radiates toward rectal area. Onset: More than a month ago. Quality: Aching, Discomfort, Shooting, Sharp, Radiating. Timing: Constant. Modifying factor(s): Medication and Laying down. Vitals:  height is 5' 5 (1.651 m) and weight is 199 lb (90.3 kg). Her temporal temperature is 97.4 F (36.3 C) (abnormal). Her blood pressure is 120/82 and her pulse is 85. Her respiration is 18 and oxygen saturation is 98%.  BMI: Estimated body mass index is 33.12 kg/m as calculated from the following:   Height as of this encounter: 5' 5 (1.651 m).   Weight as of this encounter: 199 lb (90.3 kg).  Last encounter: 01/19/2024. Last procedure: Visit date not found.  Reason  for encounter: follow-up evaluation for medication management. No change in medical history since last visit.  Patient's pain is at baseline.  Patient continues multimodal pain regimen as prescribed.  States that it provides pain relief and improvement in functional status.  The patient presents for evaluation of medication change.  Due to cost concerns, her medication was previously adjusted at the last visit.  She reports that the Percocet provides some pain relief but is not as effective as Nucynta .  I explained that Nucynta  is a brand-name medication whereas Percocet is available in the generic form, and sometimes the body requires time to adjust to different medications.  We discussed initiating a low-dose Butrans  patch to be applied once weekly.  I advised her to continue Percocet up to twice daily along with the Butrans  patch.  The goal is to gradually reduce the frequency of Percocet use at the next visit while utilizing Butrans  for baseline pain control and Percocet for breakthrough pain.  Pharmacotherapy Assessment   Started Oxycodone -acetaminophen  (Percocet) 10-325 mg tablet every 6 hours as needed for pain. MME=60 discontinue medication (Nucynta  MME=60)  Butrans  5 mcg/h, 1 patch place onto skin once a week Monitoring: [place] PMP: PDMP reviewed during this encounter.       Pharmacotherapy: No side-effects or adverse reactions reported. Compliance: No problems identified. Effectiveness: Clinically acceptable.  No notes on file  UDS:  Summary  Date Value Ref Range Status  01/12/2023 Note  Final    Comment:    ==================================================================== [employer] Select 13 (MW) ==================================================================== Test  Result       Flag       Units  Drug Present and Declared for Prescription Verification   Tapentadol                      >6579        EXPECTED   ng/mg creat    Source of tapentadol  is a  scheduled prescription medication.  ==================================================================== Test                      Result    Flag   Units      Ref Range   Creatinine              152              mg/dL      >=79 ==================================================================== Declared Medications:  The flagging and interpretation on this report are based on the  following declared medications.  Unexpected results may arise from  inaccuracies in the declared medications.   **Note: The testing scope of this panel includes these medications:   Tapentadol  (Nucynta )   **Note: The testing scope of this panel does not include the  following reported medications:   Acetaminophen  (Tylenol )  Aspirin   Buspirone  (Buspar )  Clindamycin  (Cleocin )  Dapsone  Fish Oil  Hydrocortisone  Hydroxyzine  (Vistaril )  Ibuprofen  (Advil )  Imiquimod (Aldara)  Meloxicam  (Mobic )  Methylprednisolone  (Medrol )  Metoprolol  (Toprol )  Mupirocin  (Bactroban )  Naproxen  (Naprosyn )  Omeprazole  (Prilosec)  Plecanatide  (Trulance )  Semaglutide  (Wegovy )  Topical  Tretinoin  Valsartan  (Diovan ) ==================================================================== For clinical consultation, please call [phone]. ====================================================================     No results found for: CBDTHCR No results found for: D8THCCBX No results found for: D9THCCBX  ROS  Constitutional: Denies any fever or chills Gastrointestinal: No reported hemesis, hematochezia, vomiting, or acute GI distress Musculoskeletal: Denies any acute onset joint swelling, redness, loss of ROM, or weakness Neurological: No reported episodes of acute onset apraxia, aphasia, dysarthria, agnosia, amnesia, paralysis, loss of coordination, or loss of consciousness  Medication Review  Fish Oil, Plecanatide , acetaminophen , aspirin , buprenorphine , busPIRone , gabapentin , lubiprostone , metaxalone ,  metoprolol  succinate, modafinil , naloxone , ondansetron , oxyCODONE -acetaminophen , promethazine , tirzepatide , and valsartan -hydrochlorothiazide   History Review  Allergy: [name] is allergic to ppd [tuberculin purified protein derivative]. Drug: [name]  reports no history of drug use. Alcohol:  reports no history of alcohol use. Tobacco:  reports that she has quit smoking. Her smoking use included cigarettes. She started smoking about 30 years ago. She has a 15.1 pack-year smoking history. She has never used smokeless tobacco. Social: [name]  reports that she has quit smoking. Her smoking use included cigarettes. She started smoking about 30 years ago. She has a 15.1 pack-year smoking history. She has never used smokeless tobacco. She reports that she does not drink alcohol and does not use drugs. Medical:  has a past medical history of Anxiety, Arthritis, Chest pain of uncertain etiology (05/02/2023), Chronic insomnia, Depression, Diabetes mellitus without complication (HCC), Dysrhythmia, Endometriosis, GERD (gastroesophageal reflux disease), Gout, Headache(784.0), History of kidney stones, Hypertension, Metabolic syndrome, Obesity, Right thyroid  nodule (2017), Serum calcium elevated, Sleep disorder, circadian, shift work type, SVD (spontaneous vaginal delivery), Tachycardia, and TIA (transient ischemic attack). Surgical: [name]  has a past surgical history that includes metatarsil  (2003); Dilation and curettage of uterus; Induced abortion; laparoscopy (1992); Eye surgery (06/28/2013); laparoscopy (N/A, 07/19/2013); Hysteroscopy with D & C (N/A, 07/19/2013); Abdominal hysterectomy (05/21/2016); Hemorroidectomy (04/04/2018); Colonoscopy (2019); Colonoscopy with propofol  (N/A, 07/04/2021);  Esophagogastroduodenoscopy (egd) with propofol  (N/A, 07/04/2021); Rectal surgery (08/15/2021); and Hand surgery (Right). Family: family history includes Alzheimer's disease in her paternal  grandfather; Diabetes in her mother; Heart disease in her brother; Hypertension in her father and mother; Kidney disease in her father; Leukemia in her paternal grandmother; Stroke in her maternal grandmother.  Laboratory Chemistry Profile   Renal Lab Results  Component Value Date   BUN 14 05/19/2023   CREATININE 0.87 05/19/2023   BCR SEE NOTE: 08/13/2022   GFRAA 108 11/28/2020   GFRNONAA >60 05/19/2023    Hepatic Lab Results  Component Value Date   AST 11 08/13/2022   ALT 9 08/13/2022   ALBUMIN 4.1 05/17/2022   ALKPHOS 52 05/17/2022   AMYLASE 102 (H) 05/22/2016   LIPASE 13 04/01/2022    Electrolytes Lab Results  Component Value Date   NA 141 05/19/2023   K 3.9 05/19/2023   CL 106 05/19/2023   CALCIUM 9.6 05/19/2023   MG 1.7 12/13/2022    Bone Lab Results  Component Value Date   VD25OH 37 08/13/2022    Inflammation (CRP: Acute Phase) (ESR: Chronic Phase) Lab Results  Component Value Date   CRP 0.2 05/06/2020   ESRSEDRATE 2 05/06/2020         Note: Above Lab results reviewed.  Recent Imaging Review  CT Head Wo Contrast CLINICAL DATA:  Worsening headache for 4 days.  EXAM: CT HEAD WITHOUT CONTRAST  TECHNIQUE: Contiguous axial images were obtained from the base of the skull through the vertex without intravenous contrast.  RADIATION DOSE REDUCTION: This exam was performed according to the departmental dose-optimization program which includes automated exposure control, adjustment of the mA and/or kV according to patient size and/or use of iterative reconstruction technique.  COMPARISON:  05/17/2022  FINDINGS: Brain: There is no evidence for acute hemorrhage, hydrocephalus, mass lesion, or abnormal extra-axial fluid collection. No definite CT evidence for acute infarction.  Vascular: No hyperdense vessel or unexpected calcification.  Skull: No evidence for fracture. No worrisome lytic or sclerotic lesion.  Sinuses/Orbits: The visualized paranasal  sinuses and mastoid air cells are clear. Visualized portions of the globes and intraorbital fat are unremarkable.  Other: None.  IMPRESSION: No acute intracranial abnormality.  Electronically Signed   By: Camellia Candle M.D.   On: 05/19/2023 12:12 Note: Reviewed        Physical Exam  Vitals: BP 120/82 (BP Location: Right Arm, Patient Position: Sitting)   Pulse 85   Temp (!) 97.4 F (36.3 C) (Temporal)   Resp 18   Ht 5' 5 (1.651 m)   Wt 199 lb (90.3 kg)   LMP 07/09/2015 (Exact Date)   SpO2 98%   BMI 33.12 kg/m  BMI: Estimated body mass index is 33.12 kg/m as calculated from the following:   Height as of this encounter: 5' 5 (1.651 m).   Weight as of this encounter: 199 lb (90.3 kg). Ideal: Ideal body weight: 57 kg (125 lb 10.6 oz) Adjusted ideal body weight: 70.3 kg (155 lb) General appearance: Well nourished, well developed, and well hydrated. In no apparent acute distress Mental status: Alert, oriented x 3 (person, place, & time)       Respiratory: No evidence of acute respiratory distress Eyes: PERLA   Assessment   Diagnosis Status  1. Chronic pain syndrome   2. Chronic pelvic pain in female   3. Long term prescription benzodiazepine use   4. Endometriosis   5. Cervical radiculitis    Controlled Controlled Controlled  Updated Problems: No problems updated.  Plan of Care  Problem-specific:  Assessment and Plan Started on Butrans  5 mcg/h patch, 1 patch place onto skin once a week. Continue on  Percocet 10-325 mg tablet every 6 hours as needed for pain. (Take twice with Butrans  patch) Prescribing drug monitoring (PDMP) reviewed; findings consistent with the use of prescribed medication and no evidence of other narcotic misuse or abuse.  Urine drug screening (UDS) up-to-date.  Schedule follow-up in 90 days for medication management.  [name] has a current medication list which includes the following long-term medication(s): gabapentin ,  metoprolol  succinate, modafinil , promethazine , and valsartan -hydrochlorothiazide .  Pharmacotherapy (Medications Ordered): Meds ordered this encounter  Medications   buprenorphine  (BUTRANS ) 5 MCG/HR PTWK    Sig: Place 1 patch onto the skin once a week for 28 days.    Dispense:  4 patch    Refill:  0   Orders:  No orders of the defined types were placed in this encounter.       Return in about 2 months (around 03/28/2024) for (F2F), (MM), Emmy Blanch NP.    Recent Visits Date Type Provider Dept  12/22/23 Office Visit Rylah Fukuda K, NP [facility]  Showing recent visits within past 90 days and meeting all other requirements Today's Visits Date Type Provider Dept  01/27/24 Office Visit Burtis Imhoff K, NP [facility]  Showing today's visits and meeting all other requirements Future Appointments Date Type Provider Dept  03/22/24 Appointment Lei Dower K, NP [facility]  Showing future appointments within next 90 days and meeting all other requirements  I discussed the assessment and treatment plan with the patient. The patient was provided an opportunity to ask questions and all were answered. The patient agreed with the plan and demonstrated an understanding of the instructions.  Patient advised to call back or seek an in-person evaluation if the symptoms or condition worsens.  Duration of encounter: 30 minutes.  Total time on encounter, as per AMA guidelines included both the face-to-face and non-face-to-face time personally spent by the physician and/or other qualified health care professional(s) on the day of the encounter (includes time in activities that require the physician or other qualified health care professional and does not include time in activities normally performed by clinical staff). Physician's time may include the following activities when performed: Preparing to see the patient (e.g., pre-charting review of records, searching for  previously ordered imaging, lab work, and nerve conduction tests) Review of prior analgesic pharmacotherapies. Reviewing PMP Interpreting ordered tests (e.g., lab work, imaging, nerve conduction tests) Performing post-procedure evaluations, including interpretation of diagnostic procedures Obtaining and/or reviewing separately obtained history Performing a medically appropriate examination and/or evaluation Counseling and educating the patient/family/caregiver Ordering medications, tests, or procedures Referring and communicating with other health care professionals (when not separately reported) Documenting clinical information in the electronic or other health record Independently interpreting results (not separately reported) and communicating results to the patient/ family/caregiver Care coordination (not separately reported)  Note by: Braylin Xu K Neriah Brott, NP (TTS and AI technology used. I apologize for any typographical errors that were not detected and corrected.) Date: 01/27/2024; Time: 10:32 AM

## 2024-02-02 ENCOUNTER — Other Ambulatory Visit: Payer: Self-pay

## 2024-02-02 ENCOUNTER — Encounter: Payer: Self-pay | Admitting: Nurse Practitioner

## 2024-02-02 ENCOUNTER — Other Ambulatory Visit (HOSPITAL_BASED_OUTPATIENT_CLINIC_OR_DEPARTMENT_OTHER): Payer: Self-pay

## 2024-02-04 ENCOUNTER — Ambulatory Visit: Admitting: Family Medicine

## 2024-02-04 ENCOUNTER — Other Ambulatory Visit (HOSPITAL_BASED_OUTPATIENT_CLINIC_OR_DEPARTMENT_OTHER): Payer: Self-pay

## 2024-02-04 ENCOUNTER — Encounter: Payer: Self-pay | Admitting: Family Medicine

## 2024-02-04 ENCOUNTER — Other Ambulatory Visit: Payer: Self-pay

## 2024-02-04 VITALS — BP 118/82 | HR 96 | Resp 16 | Ht 65.0 in | Wt 191.2 lb

## 2024-02-04 DIAGNOSIS — G894 Chronic pain syndrome: Secondary | ICD-10-CM

## 2024-02-04 DIAGNOSIS — K581 Irritable bowel syndrome with constipation: Secondary | ICD-10-CM | POA: Diagnosis not present

## 2024-02-04 DIAGNOSIS — F411 Generalized anxiety disorder: Secondary | ICD-10-CM

## 2024-02-04 DIAGNOSIS — F339 Major depressive disorder, recurrent, unspecified: Secondary | ICD-10-CM | POA: Diagnosis not present

## 2024-02-04 DIAGNOSIS — I10 Essential (primary) hypertension: Secondary | ICD-10-CM

## 2024-02-04 DIAGNOSIS — M797 Fibromyalgia: Secondary | ICD-10-CM

## 2024-02-04 DIAGNOSIS — R400 Somnolence: Secondary | ICD-10-CM

## 2024-02-04 DIAGNOSIS — G4700 Insomnia, unspecified: Secondary | ICD-10-CM

## 2024-02-04 DIAGNOSIS — I471 Supraventricular tachycardia, unspecified: Secondary | ICD-10-CM

## 2024-02-04 MED ORDER — MODAFINIL 100 MG PO TABS
100.0000 mg | ORAL_TABLET | Freq: Every day | ORAL | 0 refills | Status: DC
  Filled 2024-02-04: qty 90, 90d supply, fill #0

## 2024-02-04 MED ORDER — METOPROLOL SUCCINATE ER 25 MG PO TB24
25.0000 mg | ORAL_TABLET | Freq: Every day | ORAL | 1 refills | Status: DC
  Filled 2024-02-04: qty 90, 90d supply, fill #0
  Filled 2024-05-05: qty 90, 90d supply, fill #1

## 2024-02-04 MED ORDER — ZEPBOUND 2.5 MG/0.5ML ~~LOC~~ SOAJ: 2.5000 mg | SUBCUTANEOUS | 2 refills | Status: DC

## 2024-02-04 MED ORDER — GABAPENTIN 100 MG PO CAPS
100.0000 mg | ORAL_CAPSULE | Freq: Every day | ORAL | 1 refills | Status: DC
  Filled 2024-02-04: qty 90, 90d supply, fill #0

## 2024-02-04 MED ORDER — VALSARTAN-HYDROCHLOROTHIAZIDE 80-12.5 MG PO TABS
1.0000 | ORAL_TABLET | Freq: Every day | ORAL | 1 refills | Status: AC
  Filled 2024-02-04 – 2024-05-05 (×2): qty 90, 90d supply, fill #0

## 2024-02-04 NOTE — Progress Notes (Signed)
 Name: Stacey Stanley   MRN: [id number]    DOB: 1975-12-01   Date:02/04/2024       Progress Note  Subjective  Chief Complaint  Chief Complaint  Patient presents with   Medical Management of Chronic Issues    3 month follow-up   Discussed the use of AI scribe software for clinical note transcription with the patient, who gave verbal consent to proceed.  History of Present Illness Stacey Stanley is a 48 year old female with fibromyalgia who presents for a regular follow-up visit.  She has been experiencing worsening symptoms of fibromyalgia since turning 48 in June, characterized by 'whole body aching' with pain intense enough to affect her sleep and daytime focus. She previously tried gabapentin  but experienced somnolence and had not tried Lyrica  before. She was prescribed Lyrica  but switched back to gabapentin  after reporting that Lyrica  was not really working. Currently, she takes gabapentin  100 mg at night, which helps with sleep and pain management.  She has chronic pelvic and rectal pain, managed by a pain specialist. She previously used Butrans  but discontinued it. Currently, she takes oxycodone  10/325 mg, having switched from Nucynta  due to cost. She also uses Zofran  for nausea and has Narcan  due to continuous opioid use. She experiences chronic constipation and manages it with Miralax, having discontinued Trulance  due to inconsistent efficacy and Amitiza   She has hypertension and supraventricular tachycardia (SVT), managed with metoprolol  and valsartan  HCTZ 80/12.5 mg. She reports occasional palpitations and shortness of breath, with episodes lasting a couple of hours under stress.  She is managing obesity with Zepbound  2.5 mg since July, her weight decreased from 211 lbs to 191.3 lbs, she does not want to go up on dose to 5 mg due to noticing some lack of energy with medications. . She pays for Zepbound  out-of-pocket as insurance does not cover it. She is eating healthier but  not very physically active due to pain   She takes modafinil  100 mg for energy and daytime somnolence, without a recent sleep study. Her depression is currently stable and she does not take other medications for it. She continues to take baby aspirin  following a suspected TIA.    Patient Active Problem List   Diagnosis Date Noted   Daytime somnolence 10/29/2023   GAD (generalized anxiety disorder) 10/29/2023   RLS (restless legs syndrome) 10/29/2023   SVT (supraventricular tachycardia) (HCC) 10/29/2023   Obesity (BMI 30.0-34.9) 05/05/2023   Chest pain of uncertain etiology 05/02/2023   History of TIA (transient ischemic attack) 02/05/2022   Hypercalcemia 11/13/2021   Vitamin D  deficiency 11/13/2021   Squamous cell carcinoma in situ of skin 08/10/2021   Major depression, recurrent, chronic (HCC) 08/08/2021   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 08/08/2021   Chronic diarrhea of unknown origin    Gastric erosion    Polyp of rectum    Irritable bowel syndrome with constipation 11/11/2020   Medication management 11/11/2020   Chronic pain syndrome 12/28/2017   Opiate use 12/28/2017   Chronic idiopathic constipation 10/13/2017   Mass of left ovary 06/28/2017   Kidney stone on left side 06/28/2017   Cervical radiculitis 03/08/2017   Vitamin B12 deficiency 08/19/2016   Prediabetes 08/19/2016   Right thyroid  nodule 07/31/2015   Chronic pelvic pain in female 04/05/2015   GERD (gastroesophageal reflux disease) 04/05/2015   Insomnia, persistent 11/25/2014   Endometriosis 11/25/2014   Hypertension, benign 11/25/2014   Dysmetabolic syndrome 11/25/2014    Past Surgical History:  Procedure Laterality Date   ABDOMINAL HYSTERECTOMY  05/21/2016   COLONOSCOPY  2019   COLONOSCOPY WITH PROPOFOL  N/A 07/04/2021   Procedure: COLONOSCOPY WITH PROPOFOL ;  Surgeon: Unk Corinn Skiff, MD;  Location: [facility];  Service: Gastroenterology;  Laterality: N/A;   DILATION AND CURETTAGE OF UTERUS      ESOPHAGOGASTRODUODENOSCOPY (EGD) WITH PROPOFOL  N/A 07/04/2021   Procedure: ESOPHAGOGASTRODUODENOSCOPY (EGD) WITH PROPOFOL ;  Surgeon: Unk Corinn Skiff, MD;  Location: [facility] ENDOSCOPY;  Service: Gastroenterology;  Laterality: N/A;   EYE SURGERY  06/28/2013   right laser eye surgery - repair retina   HAND SURGERY Right    HEMORROIDECTOMY  04/04/2018   Dr. Karleen    HYSTEROSCOPY WITH D & C N/A 07/19/2013   Procedure: DILATATION AND CURETTAGE LELDON;  Surgeon: Dickie DELENA Carder, MD;  Location: [facility];  Service: Gynecology;  Laterality: N/A;   INDUCED ABORTION     LAPAROSCOPY  1992   endometriosis   LAPAROSCOPY N/A 07/19/2013   Procedure: LAPAROSCOPY DIAGNOSTIC  with resection of endometriosis;  Surgeon: Dickie DELENA Carder, MD;  Location: [facility];  Service: Gynecology;  Laterality: N/A;   metatarsil   2003   left foot surgery    RECTAL SURGERY  08/15/2021    Family History  Problem Relation Age of Onset   Diabetes Mother    Hypertension Mother    Hypertension Father    Kidney disease Father    Stroke Maternal Grandmother    Leukemia Paternal Grandmother    Alzheimer's disease Paternal Grandfather    Heart disease Brother    Anesthesia problems Neg Hx    Hypotension Neg Hx    Malignant hyperthermia Neg Hx    Pseudochol deficiency Neg Hx     Social History   Tobacco Use   Smoking status: Former    Current packs/day: 0.50    Average packs/day: 0.5 packs/day for 30.2 years (15.1 ttl pk-yrs)    Types: Cigarettes    Start date: 11/25/1993   Smokeless tobacco: Never  Substance Use Topics   Alcohol use: No    Alcohol/week: 0.0 standard drinks of alcohol     Current Outpatient Medications:    acetaminophen  (TYLENOL ) 500 MG tablet, Take 500 mg by mouth every 6 (six) hours as needed for mild pain., Disp: , Rfl:    aspirin  81 MG chewable tablet, Chew 1 tablet (81 mg total) by mouth daily., Disp: 90 tablet, Rfl: 0   gabapentin  (NEURONTIN ) 100 MG capsule, Take 1  capsule (100 mg total) by mouth 3 (three) times daily. To be taken in place of Lyrica ., Disp: 90 capsule, Rfl: 0   metoprolol  succinate (TOPROL -XL) 25 MG 24 hr tablet, Take 1 tablet (25 mg total) by mouth daily., Disp: 90 tablet, Rfl: 0   modafinil  (PROVIGIL ) 100 MG tablet, Take 1 tablet (100 mg total) by mouth daily., Disp: 90 tablet, Rfl: 0   naloxone  (NARCAN ) nasal spray 4 mg/0.1 mL, In case of emergency (overdose), spray once into each nostril. If no response within 3 minutes, repeat application and call 911., Disp: 1 each, Rfl: 1   Omega-3 Fatty Acids (FISH OIL) 1000 MG CAPS, Take 2,000 mg by mouth daily., Disp: , Rfl:    ondansetron  (ZOFRAN ) 4 MG tablet, Take 1 tablet (4 mg total) by mouth every 8 (eight) hours as needed for nausea or vomiting., Disp: 30 tablet, Rfl: 1   oxyCODONE -acetaminophen  (PERCOCET) 10-325 MG tablet, Take 1 tablet by mouth every 6 (six) hours as needed for pain. Must last 30  days., Disp: 120 tablet, Rfl: 0   [START ON 02/05/2024] oxyCODONE -acetaminophen  (PERCOCET) 10-325 MG tablet, Take 1 tablet by mouth every 6 (six) hours as needed for pain. Must last 30 days., Disp: 120 tablet, Rfl: 0   [START ON 03/07/2024] oxyCODONE -acetaminophen  (PERCOCET) 10-325 MG tablet, Take 1 tablet by mouth every 6 (six) hours as needed for pain. Must last 30 days., Disp: 120 tablet, Rfl: 0   promethazine  (PHENERGAN ) 25 MG tablet, Take 1 tablet (25 mg total) by mouth every 6 (six) hours as needed for nausea or vomiting., Disp: 14 tablet, Rfl: 0   tirzepatide  5 MG/0.5ML injection vial, Inject 5 mg into the skin once a week., Disp: 2 mL, Rfl: 0   valsartan -hydrochlorothiazide  (DIOVAN -HCT) 80-12.5 MG tablet, Take 1 tablet by mouth daily., Disp: 90 tablet, Rfl: 0  Allergies  Allergen Reactions   Ppd [Tuberculin Purified Protein Derivative]     I personally reviewed active problem list, medication list, allergies with the patient/caregiver today.   ROS  Ten systems reviewed and is negative  except as mentioned in HPI    Objective Physical Exam VITALS: BP- 118/82 MEASUREMENTS: Weight- 191.3. CONSTITUTIONAL: Patient appears well-developed and well-nourished. No distress. HEENT: Head atraumatic, normocephalic, neck supple. CARDIOVASCULAR: Normal rate, regular rhythm and normal heart sounds. No murmur heard. No BLE edema. PULMONARY: Effort normal and breath sounds normal. No respiratory distress. ABDOMINAL: There is no tenderness or distention. MUSCULOSKELETAL: Normal gait. Without gross motor or sensory deficit. Trigger points tender to palpation. PSYCHIATRIC: Patient has a normal mood and affect. Behavior is normal. Judgment and thought content normal.  Vitals:   02/04/24 1438  BP: 118/82  Pulse: 96  Resp: 16  SpO2: 97%  Weight: 191 lb 3 oz (86.7 kg)  Height: 5' 5 (1.651 m)    Body mass index is 31.82 kg/m.   PHQ2/9:    01/27/2024    9:55 AM 12/22/2023    8:37 AM 12/13/2023    3:24 PM 10/29/2023    2:24 PM 08/05/2023    9:49 AM  Depression screen PHQ 2/9  Decreased Interest 0 0 0 0 0  Down, Depressed, Hopeless 0 0 0 0 0  PHQ - 2 Score 0 0 0 0 0  Altered sleeping   0  0  Tired, decreased energy   0  0  Change in appetite   0  0  Feeling bad or failure about yourself    0  0  Trouble concentrating   0  0  Moving slowly or fidgety/restless   0  0  Suicidal thoughts   0  0  PHQ-9 Score   0  0  Difficult doing work/chores   Not difficult at all  Not difficult at all    phq 9 is negative  Fall Risk:    01/27/2024    9:55 AM 12/22/2023    8:37 AM 12/13/2023    3:24 PM 10/29/2023    2:24 PM 09/30/2023    1:34 PM  Fall Risk   Falls in the past year? 0 0 0 0 0  Number falls in past yr: 0  0 0   Injury with Fall? 0  0 0   Risk for fall due to :   No Fall Risks No Fall Risks   Follow up   Falls evaluation completed Falls prevention discussed;Education provided;Falls evaluation completed       Assessment & Plan Fibromyalgia and chronic pain syndrome Chronic  pain syndrome with fibromyalgia, worsening since June.  Pain intense, affecting sleep and daily focus. Gabapentin  aids sleep and pain. Chronic pelvic and rectal pain managed with oxycodone . Promethazine  and Trulance  discontinued. Chronic constipation managed with Miralax. Zofran  used for nausea. Narcan  prescribed due to opioid use. Pain level remains high, affecting function. - Continue gabapentin  100 mg at night. - Refill gabapentin  prescription with 90 tablets plus one. - Continue oxycodone  for chronic pain management. - Continue Zofran  for nausea. - Maintain Narcan  prescription due to opioid use. - Encourage finding a balance in activity to manage fibromyalgia symptoms.  Obesity, BMI >35 - history of morbid obesity  Obesity with BMI previously over 35, now reduced to 191.3 lbs from 211 lbs. Currently on Zepbound  2.5 mg weekly, self-paid. Experienced significant weight loss but reported fatigue on 5 mg dose, prefers 2.5 mg dose. Advised against phentermine due to SVT and current medication regimen. - Continue Zepbound  2.5 mg weekly. - Send prescription for Zepbound  to pharmacy for monthly refill. - Advise against use of phentermine due to SVT and current medication regimen.  Essential hypertension Hypertension managed with metoprolol  and valsartan /HCTZ. Blood pressure 118/82, indicating good control. Metoprolol  also used for SVT management. - Continue metoprolol  for hypertension and SVT. - Continue valsartan /HCTZ for hypertension.  Supraventricular tachycardia SVT managed with metoprolol , decreasing frequency of episodes. Episodes last hours under stress. Advised against phentermine due to potential exacerbation of SVT. - Continue metoprolol  for SVT management. - Advise against phentermine due to potential exacerbation of SVT.  Somnolence and insomnia Daytime somnolence managed with modafinil  100 mg. Modafinil  aids energy and depression. Insurance coverage for modafinil  uncertain with new  plan. - Continue modafinil  100 mg for daytime somnolence and energy. - Consider sleep study if time allows.  Depression Major Recurrent  Depression well-managed, PHQ-9 negative. Modafinil  used for energy and depression management.

## 2024-02-05 ENCOUNTER — Other Ambulatory Visit (HOSPITAL_BASED_OUTPATIENT_CLINIC_OR_DEPARTMENT_OTHER): Payer: Self-pay

## 2024-02-08 ENCOUNTER — Other Ambulatory Visit: Payer: Self-pay | Admitting: Family Medicine

## 2024-02-08 ENCOUNTER — Encounter: Payer: Self-pay | Admitting: Family Medicine

## 2024-02-08 MED ORDER — TIRZEPATIDE-WEIGHT MANAGEMENT 2.5 MG/0.5ML ~~LOC~~ SOLN: 2.5000 mg | SUBCUTANEOUS | 2 refills | Status: DC

## 2024-02-10 ENCOUNTER — Other Ambulatory Visit (HOSPITAL_COMMUNITY): Payer: Self-pay

## 2024-02-17 ENCOUNTER — Other Ambulatory Visit (HOSPITAL_BASED_OUTPATIENT_CLINIC_OR_DEPARTMENT_OTHER): Payer: Self-pay

## 2024-03-06 ENCOUNTER — Other Ambulatory Visit (HOSPITAL_BASED_OUTPATIENT_CLINIC_OR_DEPARTMENT_OTHER): Payer: Self-pay

## 2024-03-07 ENCOUNTER — Other Ambulatory Visit (HOSPITAL_BASED_OUTPATIENT_CLINIC_OR_DEPARTMENT_OTHER): Payer: Self-pay

## 2024-03-07 ENCOUNTER — Encounter: Payer: Self-pay | Admitting: Nurse Practitioner

## 2024-03-22 ENCOUNTER — Encounter: Admitting: Nurse Practitioner

## 2024-03-23 ENCOUNTER — Ambulatory Visit: Attending: Nurse Practitioner | Admitting: Nurse Practitioner

## 2024-03-23 ENCOUNTER — Encounter: Payer: Self-pay | Admitting: Nurse Practitioner

## 2024-03-23 ENCOUNTER — Other Ambulatory Visit (HOSPITAL_BASED_OUTPATIENT_CLINIC_OR_DEPARTMENT_OTHER): Payer: Self-pay

## 2024-03-23 VITALS — BP 108/83 | HR 102 | Temp 97.3°F | Resp 18 | Ht 65.0 in | Wt 177.0 lb

## 2024-03-23 DIAGNOSIS — G894 Chronic pain syndrome: Secondary | ICD-10-CM | POA: Insufficient documentation

## 2024-03-23 DIAGNOSIS — N809 Endometriosis, unspecified: Secondary | ICD-10-CM | POA: Insufficient documentation

## 2024-03-23 DIAGNOSIS — R102 Pelvic and perineal pain unspecified side: Secondary | ICD-10-CM | POA: Diagnosis present

## 2024-03-23 DIAGNOSIS — Z79899 Other long term (current) drug therapy: Secondary | ICD-10-CM | POA: Diagnosis present

## 2024-03-23 DIAGNOSIS — G8929 Other chronic pain: Secondary | ICD-10-CM | POA: Diagnosis present

## 2024-03-23 DIAGNOSIS — M5412 Radiculopathy, cervical region: Secondary | ICD-10-CM | POA: Diagnosis present

## 2024-03-23 MED ORDER — OXYCODONE-ACETAMINOPHEN 10-325 MG PO TABS
1.0000 | ORAL_TABLET | Freq: Four times a day (QID) | ORAL | 0 refills | Status: DC | PRN
  Filled 2024-06-05: qty 120, 30d supply, fill #0

## 2024-03-23 MED ORDER — OXYCODONE-ACETAMINOPHEN 10-325 MG PO TABS
1.0000 | ORAL_TABLET | Freq: Four times a day (QID) | ORAL | 0 refills | Status: AC | PRN
  Filled 2024-04-06: qty 120, 30d supply, fill #0

## 2024-03-23 MED ORDER — OXYCODONE-ACETAMINOPHEN 10-325 MG PO TABS
1.0000 | ORAL_TABLET | Freq: Four times a day (QID) | ORAL | 0 refills | Status: AC | PRN
  Filled 2024-05-06: qty 120, 30d supply, fill #0

## 2024-03-23 NOTE — Progress Notes (Signed)
 Nursing Pain Medication Assessment:  Safety precautions to be maintained throughout the outpatient stay will include: orient to surroundings, keep bed in low position, maintain call bell within reach at all times, provide assistance with transfer out of bed and ambulation.   Medication Inspection Compliance: Pill count conducted under aseptic conditions, in front of the patient. Neither the pills nor the bottle was removed from the patient's sight at any time. Once count was completed pills were immediately returned to the patient in their original bottle.  Medication: Oxycodone /APAP Pill/Patch Count: 26 of 120 pills/patches remain Pill/Patch Appearance: Markings consistent with prescribed medication Bottle Appearance: Standard pharmacy container. Clearly labeled. Filled Date: 60 / 07 / 2025 Last Medication intake:  Yesterday   Patient states she forgot to bring her pills at home in her pill box. She states there was 28 pills in her pill box.

## 2024-03-23 NOTE — Progress Notes (Signed)
 PROVIDER NOTE: Interpretation of information contained herein should be left to medically-trained personnel. Specific patient instructions are provided elsewhere under Patient Instructions section of medical record. This document was created in part using AI and STT-dictation technology, any transcriptional errors that may result from this process are unintentional.  Patient: Stacey Stanley  Service: E/M   PCP: Sowles, Krichna, MD  DOB: 1976-02-24  DOS: 03/23/2024  Provider: Emmy MARLA Blanch, NP  MRN: [id number]  Delivery: Face-to-face  Specialty: Interventional Pain Management  Type: Established Patient  Setting: Ambulatory outpatient facility  Specialty designation: 09  Referring Prov.: Sowles, Krichna, MD  Location: Outpatient office facility       History of present illness (HPI) Stacey Stanley, a 48 y.o. year old female, is here today because of her Chronic pain syndrome [G89.4]. Stacey Stanley primary complain today is Pelvic Pain (Radiates from front of pelvic area to the rectum )  Pertinent problems: Stacey Stanley has Endometriosis; Chronic pelvic pain in female; Cervical radiculitis; Opioid use; and Chronic pain syndrome on their pertinent problem list.  Pain Assessment: Severity of Chronic pain is reported as a 7 /10. Location: Pelvis Lower/From front of Pelvic area to the rectum. Onset: More than a month ago. Quality: Aching, Burning, Shooting, Sharp, Radiating, Discomfort. Timing: Constant. Modifying factor(s): Warm Baths. Vitals:  height is 5' 5 (1.651 m) and weight is 177 lb (80.3 kg). Her temporal temperature is 97.3 F (36.3 C) (abnormal). Her blood pressure is 108/83 and her pulse is 102 (abnormal). Her respiration is 18 and oxygen saturation is 100%.  BMI: Estimated body mass index is 29.45 kg/m as calculated from the following:   Height as of this encounter: 5' 5 (1.651 m).   Weight as of this encounter: 177 lb (80.3 kg).  Last encounter: 01/27/2024. Last procedure:  Visit date not found.  Reason for encounter: medication management. No change in medical history since last visit.  Patient's pain is at baseline.  Patient continues multimodal pain regimen as prescribed.  States that it provides pain relief and improvement in functional status.   Discussed the use of AI scribe software for clinical note transcription with the patient, who gave verbal consent to proceed.  History of Present Illness   Stacey Stanley is a 48 year old female with a history of anal cancer who presents with pain management issues. Due to cost concerns, her medication was previously adjusted at the last visit.  She reports that the Percocet provides some pain relief but is not as effective as Nucynta .  I explained that Nucynta  is a brand-name medication whereas Percocet is available in the generic form, and sometimes the body requires time to adjust to different medications.   She has been experiencing difficulty managing her pain due to the high cost of Nucynta , which she previously used with adequate relief. Due to cost issues, she was switched to Percocet, which does not provide relief, similar to her experience with it ten years ago. She is currently taking Percocet every six hours, with a prescription for 120 pills.  She reports that when she had anal cancer, Nucynta  provided adequate pain relief. She mentions that her pathology has changed since starting pain management, and she is concerned about the cost and insurance coverage for Nucynta .  She experiences pain primarily in the anal and pelvic area. Additionally, she reports flank pain on the left side, which she associates with kidney stones. She has not had recent labs to assess kidney function but  denies any known kidney failure.     Pharmacotherapy Assessment   Started Oxycodone -acetaminophen  (Percocet) 10-325 mg tablet every 6 hours as needed for pain. MME=60 Monitoring: [place] PMP: PDMP reviewed during this encounter.        Pharmacotherapy: No side-effects or adverse reactions reported. Compliance: No problems identified. Effectiveness: Clinically acceptable.  Bonner Norris, Stacey Stanley  03/23/2024  9:05 AM  Sign when Signing Visit Nursing Pain Medication Assessment:  Safety precautions to be maintained throughout the outpatient stay will include: orient to surroundings, keep bed in low position, maintain call bell within reach at all times, provide assistance with transfer out of bed and ambulation.   Medication Inspection Compliance: Pill count conducted under aseptic conditions, in front of the patient. Neither the pills nor the bottle was removed from the patient's sight at any time. Once count was completed pills were immediately returned to the patient in their original bottle.  Medication: Oxycodone /APAP Pill/Patch Count: 26 of 120 pills/patches remain Pill/Patch Appearance: Markings consistent with prescribed medication Bottle Appearance: Standard pharmacy container. Clearly labeled. Filled Date: 51 / 07 / 2025 Last Medication intake:  Yesterday   Patient states she forgot to bring her pills at home in her pill box. She states there was 28 pills in her pill box.     UDS:  Summary  Date Value Ref Range Status  01/12/2023 Note  Final    Comment:    ==================================================================== ToxASSURE Select 13 (MW) ==================================================================== Test                             Result       Flag       Units  Drug Present and Declared for Prescription Verification   Tapentadol                      >6579        EXPECTED   ng/mg creat    Source of tapentadol  is a scheduled prescription medication.  ==================================================================== Test                      Result    Flag   Units      Ref Range   Creatinine              152              mg/dL       >=79 ==================================================================== Declared Medications:  The flagging and interpretation on this report are based on the  following declared medications.  Unexpected results may arise from  inaccuracies in the declared medications.   **Note: The testing scope of this panel includes these medications:   Tapentadol  (Nucynta )   **Note: The testing scope of this panel does not include the  following reported medications:   Acetaminophen  (Tylenol )  Aspirin   Buspirone  (Buspar )  Clindamycin  (Cleocin )  Dapsone  Fish Oil  Hydrocortisone  Hydroxyzine  (Vistaril )  Ibuprofen  (Advil )  Imiquimod (Aldara)  Meloxicam  (Mobic )  Methylprednisolone  (Medrol )  Metoprolol  (Toprol )  Mupirocin  (Bactroban )  Naproxen  (Naprosyn )  Omeprazole  (Prilosec)  Plecanatide  (Trulance )  Semaglutide  (Wegovy )  Topical  Tretinoin  Valsartan  (Diovan ) ==================================================================== For clinical consultation, please call [phone]. ====================================================================     No results found for: CBDTHCR No results found for: D8THCCBX No results found for: D9THCCBX  ROS  Constitutional: Denies any fever or chills Gastrointestinal: No reported hemesis, hematochezia, vomiting, or acute GI distress Musculoskeletal: Pelvic Pain (Radiates  from front of pelvic area to the rectum ) Neurological: No reported episodes of acute onset apraxia, aphasia, dysarthria, agnosia, amnesia, paralysis, loss of coordination, or loss of consciousness  Medication Review  Fish Oil, acetaminophen , aspirin , gabapentin , metoprolol  succinate, modafinil , naloxone , ondansetron , oxyCODONE -acetaminophen , promethazine , tirzepatide , and valsartan -hydrochlorothiazide   History Review  Allergy: Stacey Stanley is allergic to ppd [tuberculin purified protein derivative]. Drug: Stacey Stanley  reports no history of drug use. Alcohol:   reports no history of alcohol use. Tobacco:  reports that she has quit smoking. Her smoking use included cigarettes. She started smoking about 30 years ago. She has a 15.2 pack-year smoking history. She has never used smokeless tobacco. Social: Stacey Stanley  reports that she has quit smoking. Her smoking use included cigarettes. She started smoking about 30 years ago. She has a 15.2 pack-year smoking history. She has never used smokeless tobacco. She reports that she does not drink alcohol and does not use drugs. Medical:  has a past medical history of Anxiety, Arthritis, Chest pain of uncertain etiology (05/02/2023), Chronic insomnia, Depression, Diabetes mellitus without complication (HCC), Dysrhythmia, Endometriosis, GERD (gastroesophageal reflux disease), Gout, Headache(784.0), History of kidney stones, Hypertension, Metabolic syndrome, Obesity, Right thyroid  nodule (2017), Serum calcium elevated, Sleep disorder, circadian, shift work type, SVD (spontaneous vaginal delivery), Tachycardia, and TIA (transient ischemic attack). Surgical: Mrs. Novitski  has a past surgical history that includes metatarsil  (2003); Dilation and curettage of uterus; Induced abortion; laparoscopy (1992); Eye surgery (06/28/2013); laparoscopy (N/A, 07/19/2013); Hysteroscopy with D & C (N/A, 07/19/2013); Abdominal hysterectomy (05/21/2016); Hemorroidectomy (04/04/2018); Colonoscopy (2019); Colonoscopy with propofol  (N/A, 07/04/2021); Esophagogastroduodenoscopy (egd) with propofol  (N/A, 07/04/2021); Rectal surgery (08/15/2021); and Hand surgery (Right). Family: family history includes Alzheimer's disease in her paternal grandfather; Diabetes in her mother; Heart disease in her brother; Hypertension in her father and mother; Kidney disease in her father; Leukemia in her paternal grandmother; Stroke in her maternal grandmother.  Laboratory Chemistry Profile   Renal Lab Results  Component Value Date   BUN 14 05/19/2023    CREATININE 0.87 05/19/2023   BCR SEE NOTE: 08/13/2022   GFRAA 108 11/28/2020   GFRNONAA >60 05/19/2023    Hepatic Lab Results  Component Value Date   AST 11 08/13/2022   ALT 9 08/13/2022   ALBUMIN 4.1 05/17/2022   ALKPHOS 52 05/17/2022   AMYLASE 102 (H) 05/22/2016   LIPASE 13 04/01/2022    Electrolytes Lab Results  Component Value Date   NA 141 05/19/2023   K 3.9 05/19/2023   CL 106 05/19/2023   CALCIUM 9.6 05/19/2023   MG 1.7 12/13/2022    Bone Lab Results  Component Value Date   VD25OH 37 08/13/2022    Inflammation (CRP: Acute Phase) (ESR: Chronic Phase) Lab Results  Component Value Date   CRP 0.2 05/06/2020   ESRSEDRATE 2 05/06/2020         Note: Above Lab results reviewed.  Recent Imaging Review  CT Head Wo Contrast CLINICAL DATA:  Worsening headache for 4 days.  EXAM: CT HEAD WITHOUT CONTRAST  TECHNIQUE: Contiguous axial images were obtained from the base of the skull through the vertex without intravenous contrast.  RADIATION DOSE REDUCTION: This exam was performed according to the departmental dose-optimization program which includes automated exposure control, adjustment of the mA and/or kV according to patient size and/or use of iterative reconstruction technique.  COMPARISON:  05/17/2022  FINDINGS: Brain: There is no evidence for acute hemorrhage, hydrocephalus, mass lesion, or abnormal extra-axial fluid collection. No definite CT evidence  for acute infarction.  Vascular: No hyperdense vessel or unexpected calcification.  Skull: No evidence for fracture. No worrisome lytic or sclerotic lesion.  Sinuses/Orbits: The visualized paranasal sinuses and mastoid air cells are clear. Visualized portions of the globes and intraorbital fat are unremarkable.  Other: None.  IMPRESSION: No acute intracranial abnormality.  Electronically Signed   By: Camellia Candle M.D.   On: 05/19/2023 12:12 Note: Reviewed        Physical Exam  Vitals: BP  108/83 (BP Location: Right Arm, Patient Position: Sitting, Cuff Size: Normal)   Pulse (!) 102   Temp (!) 97.3 F (36.3 C) (Temporal)   Resp 18   Ht 5' 5 (1.651 m)   Wt 177 lb (80.3 kg)   LMP 07/09/2015 (Exact Date)   SpO2 100%   BMI 29.45 kg/m  BMI: Estimated body mass index is 29.45 kg/m as calculated from the following:   Height as of this encounter: 5' 5 (1.651 m).   Weight as of this encounter: 177 lb (80.3 kg). Ideal: Ideal body weight: 57 kg (125 lb 10.6 oz) Adjusted ideal body weight: 66.3 kg (146 lb 3.2 oz) General appearance: Well nourished, well developed, and well hydrated. In no apparent acute distress Mental status: Alert, oriented x 3 (person, place, & time)       Respiratory: No evidence of acute respiratory distress Eyes: PERLA  Musculoskeletal: Pelvic Pain (Radiates from front of pelvic area to the rectum ) Assessment   Diagnosis Status  1. Chronic pain syndrome   2. Medication management   3. Chronic pelvic pain in female   4. Long term prescription benzodiazepine use   5. Endometriosis   6. Cervical radiculitis    Controlled Controlled Controlled   Updated Problems: No problems updated.  Plan of Care  Problem-specific:  Assessment and Plan    Chronic pain syndrome Chronic anal pain previously not adequately managed with Percocet. Nucynta  previously effective but unaffordable. New insurance may cover Nucynta  in January 2026. - Continue Percocet until January 2026. - Reassess insurance coverage for Nucynta  in January 2026. - Provide three-month prescription. - Collect urine sample for monitoring. Patient's pain is well-controlled with Percocet, will continue on current medication regimen.  Prescribing drug monitoring (PMP) reviewed, findings consistent with the use of prescribed medication and no evidence of narcotic misuse or abuse. Routine UDS ordered today.  The patient was advised to drink more water  to prevent from opioid induced constipation.   Schedule follow-up in 90 days for medication management.  Medication management:   Medications   oxyCODONE -acetaminophen  (PERCOCET) 10-325 MG tablet    Sig: Take 1 tablet by mouth every 6 (six) hours as needed for pain. Must last 30 days.    Dispense:  120 tablet    Refill:  0    Chronic Pain: STOP Act (Not applicable) Fill 1 day early if closed on refill date. Avoid benzodiazepines within 8 hours of opioids   oxyCODONE -acetaminophen  (PERCOCET) 10-325 MG tablet    Sig: Take 1 tablet by mouth every 6 (six) hours as needed for pain. Must last 30 days.    Dispense:  120 tablet    Refill:  0    Chronic Pain: STOP Act (Not applicable) Fill 1 day early if closed on refill date. Avoid benzodiazepines within 8 hours of opioids   oxyCODONE -acetaminophen  (PERCOCET) 10-325 MG tablet    Sig: Take 1 tablet by mouth every 6 (six) hours as needed for pain. Must last 30 days.    Dispense:  120 tablet    Refill:  0    Chronic Pain: STOP Act (Not applicable) Fill 1 day early if closed on refill date. Avoid benzodiazepines within 8 hours of opioids   Chronic pelvic pain in female: Patient continues experiencing some chronic pelvic pain and rectum pain; however, the current pain medication regimen helped to manage pain level and provide functional mobility without any side effects or adverse reaction from medication.       Stacey Stanley has a current medication list which includes the following long-term medication(s): gabapentin , metoprolol  succinate, modafinil , promethazine , and valsartan -hydrochlorothiazide .  Pharmacotherapy (Medications Ordered): Meds ordered this encounter  Medications   oxyCODONE -acetaminophen  (PERCOCET) 10-325 MG tablet    Sig: Take 1 tablet by mouth every 6 (six) hours as needed for pain. Must last 30 days.    Dispense:  120 tablet    Refill:  0    Chronic Pain: STOP Act (Not applicable) Fill 1 day early if closed on refill date. Avoid benzodiazepines within 8 hours  of opioids   oxyCODONE -acetaminophen  (PERCOCET) 10-325 MG tablet    Sig: Take 1 tablet by mouth every 6 (six) hours as needed for pain. Must last 30 days.    Dispense:  120 tablet    Refill:  0    Chronic Pain: STOP Act (Not applicable) Fill 1 day early if closed on refill date. Avoid benzodiazepines within 8 hours of opioids   oxyCODONE -acetaminophen  (PERCOCET) 10-325 MG tablet    Sig: Take 1 tablet by mouth every 6 (six) hours as needed for pain. Must last 30 days.    Dispense:  120 tablet    Refill:  0    Chronic Pain: STOP Act (Not applicable) Fill 1 day early if closed on refill date. Avoid benzodiazepines within 8 hours of opioids   Orders:  Orders Placed This Encounter  Procedures   ToxASSURE Select 13 (MW), Urine    Volume: 30 ml(s). Minimum 3 ml of urine is needed. Document temperature of fresh sample. Indications: Long term (current) use of opiate analgesic (S20.108)    Release to patient:   Immediate        Return in about 3 months (around 06/23/2024) for (F2F), (MM), Emmy Blanch NP.    Recent Visits Date Type Provider Dept  01/27/24 Office Visit Vail Vuncannon K, NP [facility]  Showing recent visits within past 90 days and meeting all other requirements Today's Visits Date Type Provider Dept  03/23/24 Office Visit Elana Jian K, NP [facility]  Showing today's visits and meeting all other requirements Future Appointments Date Type Provider Dept  06/13/24 Appointment Annjeanette Sarwar K, NP [facility]  Showing future appointments within next 90 days and meeting all other requirements  I discussed the assessment and treatment plan with the patient. The patient was provided an opportunity to ask questions and all were answered. The patient agreed with the plan and demonstrated an understanding of the instructions.  Patient advised to call back or seek an in-person evaluation if the symptoms or condition worsens. I personally spent a total of  30 minutes in the care of the patient today including preparing to see the patient, getting/reviewing separately obtained history, performing a medically appropriate exam/evaluation, counseling and educating, placing orders, referring and communicating with other health care professionals, documenting clinical information in the EHR, independently interpreting results, communicating results, and coordinating care.  Note by: Lesia Monica K Karolyna Bianchini, NP (TTS and AI technology used. I apologize for any  typographical errors that were not detected and corrected.) Date: 03/23/2024; Time: 11:44 AM

## 2024-03-30 LAB — TOXASSURE SELECT 13 (MW), URINE

## 2024-04-05 ENCOUNTER — Other Ambulatory Visit (HOSPITAL_BASED_OUTPATIENT_CLINIC_OR_DEPARTMENT_OTHER): Payer: Self-pay

## 2024-04-06 ENCOUNTER — Other Ambulatory Visit (HOSPITAL_BASED_OUTPATIENT_CLINIC_OR_DEPARTMENT_OTHER): Payer: Self-pay

## 2024-04-06 ENCOUNTER — Other Ambulatory Visit (HOSPITAL_COMMUNITY): Payer: Self-pay

## 2024-04-18 ENCOUNTER — Emergency Department (HOSPITAL_BASED_OUTPATIENT_CLINIC_OR_DEPARTMENT_OTHER)
Admission: EM | Admit: 2024-04-18 | Discharge: 2024-04-18 | Disposition: A | Discharge status: Home / Self Care | Attending: Emergency Medicine | Admitting: Emergency Medicine

## 2024-04-18 ENCOUNTER — Other Ambulatory Visit: Payer: Self-pay

## 2024-04-18 ENCOUNTER — Emergency Department (HOSPITAL_BASED_OUTPATIENT_CLINIC_OR_DEPARTMENT_OTHER): Admitting: Radiology

## 2024-04-18 ENCOUNTER — Encounter (HOSPITAL_BASED_OUTPATIENT_CLINIC_OR_DEPARTMENT_OTHER): Payer: Self-pay | Admitting: Emergency Medicine

## 2024-04-18 DIAGNOSIS — R252 Cramp and spasm: Secondary | ICD-10-CM | POA: Insufficient documentation

## 2024-04-18 DIAGNOSIS — R079 Chest pain, unspecified: Secondary | ICD-10-CM | POA: Diagnosis present

## 2024-04-18 DIAGNOSIS — Z7982 Long term (current) use of aspirin: Secondary | ICD-10-CM | POA: Insufficient documentation

## 2024-04-18 DIAGNOSIS — R0789 Other chest pain: Secondary | ICD-10-CM | POA: Insufficient documentation

## 2024-04-18 LAB — BASIC METABOLIC PANEL WITH GFR
Anion gap: 10 (ref 5–15)
BUN: 8 mg/dL (ref 6–20)
CO2: 25 mmol/L (ref 22–32)
Calcium: 10.4 mg/dL — ABNORMAL HIGH (ref 8.9–10.3)
Chloride: 105 mmol/L (ref 98–111)
Creatinine, Ser: 0.74 mg/dL (ref 0.44–1.00)
GFR, Estimated: >60 mL/min (ref >60)
Glucose, Bld: 98 mg/dL (ref 70–99)
Potassium: 3.6 mmol/L (ref 3.5–5.1)
Sodium: 140 mmol/L (ref 135–145)

## 2024-04-18 LAB — CBC
HCT: 36.5 % (ref 36.0–46.0)
Hemoglobin: 12.2 g/dL (ref 12.0–15.0)
MCH: 28.7 pg (ref 26.0–34.0)
MCHC: 33.4 g/dL (ref 30.0–36.0)
MCV: 85.9 fL (ref 80.0–100.0)
Platelets: 313 K/uL (ref 150–400)
RBC: 4.25 MIL/uL (ref 3.87–5.11)
RDW: 14 % (ref 11.5–15.5)
WBC: 6.1 K/uL (ref 4.0–10.5)
nRBC: 0 % (ref 0.0–0.2)

## 2024-04-18 LAB — D-DIMER, QUANTITATIVE: D-Dimer, Quant: <0.27 ug{FEU}/mL (ref 0.00–0.50)

## 2024-04-18 LAB — TROPONIN T, HIGH SENSITIVITY
Troponin T High Sensitivity: <15 ng/L (ref 0–19)
Troponin T High Sensitivity: <15 ng/L (ref 0–19)

## 2024-04-18 MED ORDER — IBUPROFEN 400 MG PO TABS
600.0000 mg | ORAL_TABLET | Freq: Once | ORAL | Status: AC
  Administered 2024-04-18: 600 mg via ORAL
  Filled 2024-04-18: qty 1

## 2024-04-18 NOTE — ED Notes (Signed)
 ED Provider at bedside.

## 2024-04-18 NOTE — ED Provider Notes (Signed)
  EMERGENCY DEPARTMENT AT [facility]  Provider Note  CSN: [id number] Arrival date & time: 04/18/24 0222  History Chief Complaint  Patient presents with   Chest Pain    Stacey Stanley is a 48 y.o. female with history of anxiety reports she began having cramping pain in L calf yesterday morning. Bothered her off and on all day, no significant swelling. She was lying down to go to bed tonight and began having some sharp chest pain. She began to feel anxious and so came to the ED for evaluation. No recent cough, congestion or fever. No recent travel. Does not smoke, no exogenous estrogen, no recent surgery or cancer treatment.    Home Medications Prior to Admission medications   Medication Sig Start Date End Date Taking? Authorizing Provider  acetaminophen  (TYLENOL ) 500 MG tablet Take 500 mg by mouth every 6 (six) hours as needed for mild pain.    [provider]  aspirin  81 MG chewable tablet Chew 1 tablet (81 mg total) by mouth daily. 12/12/21   Hildegard, Amjad, PA-C  gabapentin  (NEURONTIN ) 100 MG capsule Take 1 capsule (100 mg total) by mouth at bedtime. To be taken in place of Lyrica . 02/04/24   Sowles, Krichna, MD  metoprolol  succinate (TOPROL -XL) 25 MG 24 hr tablet Take 1 tablet (25 mg total) by mouth daily. 02/04/24   Sowles, Krichna, MD  modafinil  (PROVIGIL ) 100 MG tablet Take 1 tablet (100 mg total) by mouth daily. 02/04/24   Sowles, Krichna, MD  naloxone  (NARCAN ) nasal spray 4 mg/0.1 mL In case of emergency (overdose), spray once into each nostril. If no response within 3 minutes, repeat application and call 911. 12/22/23 12/21/24  Patel, Seema K, NP  Omega-3 Fatty Acids (FISH OIL) 1000 MG CAPS Take 2,000 mg by mouth daily.    [provider]  ondansetron  (ZOFRAN ) 4 MG tablet Take 1 tablet (4 mg total) by mouth every 8 (eight) hours as needed for nausea or vomiting. 06/16/23   [place], PA-C  oxyCODONE -acetaminophen  (PERCOCET) 10-325 MG tablet Take 1  tablet by mouth every 6 (six) hours as needed for pain. Must last 30 days. 04/06/24 05/06/24  Patel, Seema K, NP  oxyCODONE -acetaminophen  (PERCOCET) 10-325 MG tablet Take 1 tablet by mouth every 6 (six) hours as needed for pain. Must last 30 days. 05/06/24 06/05/24  Patel, Seema K, NP  oxyCODONE -acetaminophen  (PERCOCET) 10-325 MG tablet Take 1 tablet by mouth every 6 (six) hours as needed for pain. Must last 30 days. 06/05/24 07/05/24  Patel, Seema K, NP  promethazine  (PHENERGAN ) 25 MG tablet Take 1 tablet (25 mg total) by mouth every 6 (six) hours as needed for nausea or vomiting. 05/11/23   Vanga, Corinn Skiff, MD  tirzepatide  (ZEPBOUND ) 2.5 MG/0.5ML injection vial Inject 2.5 mg into the skin once a week. 02/08/24   Sowles, Krichna, MD  valsartan -hydrochlorothiazide  (DIOVAN -HCT) 80-12.5 MG tablet Take 1 tablet by mouth daily. 02/04/24   Sowles, Krichna, MD     Allergies    Ppd [tuberculin purified protein derivative]   Review of Systems   Review of Systems Please see HPI for pertinent positives and negatives  Physical Exam BP 111/80   Pulse 80   Temp (!) 97.1 F (36.2 C)   Resp 15   LMP 07/09/2015 (Exact Date)   SpO2 98%   Physical Exam Vitals and nursing note reviewed.  Constitutional:      Appearance: Normal appearance.  HENT:     Head: Normocephalic and atraumatic.  Nose: Nose normal.     Mouth/Throat:     Mouth: Mucous membranes are moist.  Eyes:     Extraocular Movements: Extraocular movements intact.     Conjunctiva/sclera: Conjunctivae normal.  Cardiovascular:     Rate and Rhythm: Normal rate.  Pulmonary:     Effort: Pulmonary effort is normal.     Breath sounds: Normal breath sounds.  Abdominal:     General: Abdomen is flat.     Palpations: Abdomen is soft.     Tenderness: There is no abdominal tenderness.  Musculoskeletal:        General: No swelling. Normal range of motion.     Cervical back: Neck supple.     Right lower leg: No edema.     Left lower leg:  Tenderness (L calf, no proximal deep vein tenderness) present. No edema.  Skin:    General: Skin is warm and dry.  Neurological:     General: No focal deficit present.     Mental Status: She is alert.  Psychiatric:        Mood and Affect: Mood normal.     ED Results / Procedures / Treatments   EKG None  Procedures Procedures  Medications Ordered in the ED Medications  ibuprofen  (ADVIL ) tablet 600 mg (600 mg Oral Given 04/18/24 0351)    Initial Impression and Plan  Patient here with atypical chest pain in setting of calf cramping earlier in the day. Low risk for CAD, DVT/PE, will check labs including dimer. Motrin  for pain.   ED Course   Clinical Course as of 04/18/24 0515  Tue Apr 18, 2024  0346 CBC is normal. Dimer is negative. No concern for PE or DVT now.  [CS]  0347 I personally viewed the images from radiology studies and agree with radiologist interpretation: CXR is clear [CS]  0347 BMP and Trop x1 are normal. Will check delta trop to complete ACS evaluation.  [CS]  0514 Delta trop remains normal. Patient resting comfortably. Recommend NSAID or her usual pain medications as needed. PCP follow up, RTED for any other concerns.   [CS]    Clinical Course User Index [CS] Roselyn Carlin NOVAK, MD     MDM Rules/Calculators/A&P Medical Decision Making Given presenting complaint, I considered that admission might be necessary. After review of results from ED lab and/or imaging studies, admission to the hospital is not indicated at this time.    Problems Addressed: Atypical chest pain: acute illness or injury Calf cramp: acute illness or injury  Amount and/or Complexity of Data Reviewed Labs: ordered. Decision-making details documented in ED Course. Radiology: ordered and independent interpretation performed. Decision-making details documented in ED Course. ECG/medicine tests: ordered and independent interpretation performed. Decision-making details documented in ED  Course.  Risk Decision regarding hospitalization.     Final Clinical Impression(s) / ED Diagnoses Final diagnoses:  Atypical chest pain  Calf cramp    Rx / DC Orders ED Discharge Orders     None        Roselyn Carlin NOVAK, MD 04/18/24 [phone]

## 2024-04-18 NOTE — ED Triage Notes (Signed)
 Left calf pain today. 0000 noticed CP, left sided radiating into back.

## 2024-04-21 ENCOUNTER — Other Ambulatory Visit: Payer: Self-pay | Admitting: Family Medicine

## 2024-04-23 NOTE — Telephone Encounter (Signed)
 Requested medication (s) are due for refill today -yes  Requested medication (s) are on the active medication list -yes  Future visit scheduled -yes  Last refill: 02/08/24 2ml 2RF  Notes to clinic: off protocol- provider review   Requested Prescriptions  Pending Prescriptions Disp Refills   ZEPBOUND  2.5 MG/0.5ML injection vial [Pharmacy Med Name: ZEPBOUND  2.5 MG/0.5ML SUBCUTANEOUS SOLUTION] 2 mL 2    Sig: INJECT 0.5 ML (2.5 MG) UNDER THE SKIN ONCE WEEKLY (0.5ML= 50 UNITS)     Off-Protocol Failed - 04/23/2024  8:29 AM      Failed - Medication not assigned to a protocol, review manually.      Passed - Valid encounter within last 12 months    Recent Outpatient Visits           2 months ago SVT (supraventricular tachycardia)   [facility] Glenard Mire, MD   4 months ago Morbid obesity [facility])   [facility] Glenard Mire, MD   5 months ago SVT (supraventricular tachycardia)   [facility] Glenard Mire, MD   8 months ago Immunization due   [facility] Glenard Mire, MD       Future Appointments             In 1 week Sowles, Krichna, MD [facility], [place]               Requested Prescriptions  Pending Prescriptions Disp Refills   ZEPBOUND  2.5 MG/0.5ML injection vial [Pharmacy Med Name: ZEPBOUND  2.5 MG/0.5ML SUBCUTANEOUS SOLUTION] 2 mL 2    Sig: INJECT 0.5 ML (2.5 MG) UNDER THE SKIN ONCE WEEKLY (0.5ML= 50 UNITS)     Off-Protocol Failed - 04/23/2024  8:29 AM      Failed - Medication not assigned to a protocol, review manually.      Passed - Valid encounter within last 12 months    Recent Outpatient Visits           2 months ago SVT (supraventricular tachycardia)   [employer] [facility] Glenard Mire, MD   4 months ago Morbid obesity [facility])   [place] [facility] Glenard Mire, MD    5 months ago SVT (supraventricular tachycardia)   [facility] Sowles, Krichna, MD   8 months ago Immunization due   [facility] Sowles, Krichna, MD       Future Appointments             In 1 week Sowles, Krichna, MD [facility], [place]

## 2024-05-05 ENCOUNTER — Encounter: Payer: Self-pay | Admitting: Family Medicine

## 2024-05-05 ENCOUNTER — Ambulatory Visit: Admitting: Family Medicine

## 2024-05-05 ENCOUNTER — Other Ambulatory Visit (HOSPITAL_BASED_OUTPATIENT_CLINIC_OR_DEPARTMENT_OTHER): Payer: Self-pay

## 2024-05-05 VITALS — BP 112/74 | HR 94 | Resp 16 | Ht 65.0 in | Wt 166.7 lb

## 2024-05-05 DIAGNOSIS — G4700 Insomnia, unspecified: Secondary | ICD-10-CM

## 2024-05-05 DIAGNOSIS — G8929 Other chronic pain: Secondary | ICD-10-CM

## 2024-05-05 DIAGNOSIS — Z8639 Personal history of other endocrine, nutritional and metabolic disease: Secondary | ICD-10-CM

## 2024-05-05 DIAGNOSIS — M542 Cervicalgia: Secondary | ICD-10-CM | POA: Diagnosis not present

## 2024-05-05 DIAGNOSIS — R102 Pelvic and perineal pain unspecified side: Secondary | ICD-10-CM | POA: Diagnosis not present

## 2024-05-05 DIAGNOSIS — F325 Major depressive disorder, single episode, in full remission: Secondary | ICD-10-CM | POA: Diagnosis not present

## 2024-05-05 DIAGNOSIS — M797 Fibromyalgia: Secondary | ICD-10-CM

## 2024-05-05 DIAGNOSIS — Z23 Encounter for immunization: Secondary | ICD-10-CM | POA: Diagnosis not present

## 2024-05-05 DIAGNOSIS — I10 Essential (primary) hypertension: Secondary | ICD-10-CM | POA: Diagnosis not present

## 2024-05-05 DIAGNOSIS — I471 Supraventricular tachycardia, unspecified: Secondary | ICD-10-CM | POA: Diagnosis not present

## 2024-05-05 DIAGNOSIS — K581 Irritable bowel syndrome with constipation: Secondary | ICD-10-CM

## 2024-05-05 MED ORDER — METHYLPREDNISOLONE 4 MG PO TBPK
ORAL_TABLET | ORAL | 0 refills | Status: DC
  Filled 2024-05-05: qty 21, 6d supply, fill #0

## 2024-05-05 MED ORDER — METOPROLOL SUCCINATE ER 25 MG PO TB24: 25.0000 mg | ORAL_TABLET | Freq: Every day | ORAL | 1 refills | Status: AC

## 2024-05-05 MED ORDER — ZEPBOUND 2.5 MG/0.5ML ~~LOC~~ SOLN: 2.5000 mg | SUBCUTANEOUS | 2 refills | Status: AC

## 2024-05-05 MED ORDER — CYCLOBENZAPRINE HCL 5 MG PO TABS
5.0000 mg | ORAL_TABLET | Freq: Every evening | ORAL | 0 refills | Status: DC | PRN
  Filled 2024-05-05: qty 30, 30d supply, fill #0

## 2024-05-05 NOTE — Progress Notes (Signed)
 Name: Stacey Stanley   MRN: [id number]    DOB: 16-Jun-1975   Date:05/05/2024       Progress Note  Subjective  Chief Complaint  Chief Complaint  Patient presents with   Medical Management of Chronic Issues   Discussed the use of AI scribe software for clinical note transcription with the patient, who gave verbal consent to proceed.  History of Present Illness Stacey Stanley is a 48 year old female who presents for a regular follow-up visit at the pain clinic.  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 She has not yet started the prescribed medication for this new back pain, she was given Flexeril  and medrol  dose pack by [facility] yesterday.  She has a history of obesity and has been actively managing her weight. She started on Zepbound  in July 2025 with a weight of 211 pounds and weight is down to  166 pounds currently. She follows a diet with very little fat, a lot of vegetables, and no sugar. She is currently on a 2.5 mg dose of Zepbound  weekly. She has previously tried Ozempic  and Wegovy  but did not work as well for her  She has chronic pain issues including fibromyalgia, chronic neck pain, pelvic pain, and rectal pain post-cancer removal. She was previously on Nucynta  but switched to oxycodone  due to insurance issues. She experiences constipation as a side effect of oxycodone , for which she takes Miralax and it works well for IBS constipation and opioid induced constipation . She experiences occasional nausea, for which she uses Zofran .  She has  supraventricular tachycardia and is on metoprolol , which she recently ran out of. She also takes valsartan  for blood pressure management, which is currently well-controlled.   She no longer takes modafinil   for daytime somnolence or gabapentin  for pain/FMS since she has been feeling better due to weight loss.  She has a history of major  depression, which is currently in remission. She is not on any antidepressants.  She has IBS with constipation but prefers not to use prescription medications for it, opting for fiber instead.  She is transitioning to a new job with the [employer], moving away from her current job due to dissatisfaction.      Patient Active Problem List   Diagnosis Date Noted   Daytime somnolence 10/29/2023   GAD (generalized anxiety disorder) 10/29/2023   RLS (restless legs syndrome) 10/29/2023   SVT (supraventricular tachycardia) 10/29/2023   Obesity (BMI 30.0-34.9) 05/05/2023   Chest pain of uncertain etiology 05/02/2023   History of TIA (transient ischemic attack) 02/05/2022   Hypercalcemia 11/13/2021   Vitamin D  deficiency 11/13/2021   Squamous cell carcinoma in situ of skin 08/10/2021   Major depression, recurrent, chronic 08/08/2021   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 08/08/2021   Chronic diarrhea of unknown origin    Gastric erosion    Polyp of rectum    Irritable bowel syndrome with constipation 11/11/2020   Medication management 11/11/2020   Chronic pain syndrome 12/28/2017   Opiate use 12/28/2017   Chronic idiopathic constipation 10/13/2017   Mass of left ovary 06/28/2017   Kidney stone on left side 06/28/2017   Cervical radiculitis 03/08/2017   Vitamin B12 deficiency 08/19/2016   Prediabetes 08/19/2016   Right thyroid  nodule 07/31/2015   Chronic pelvic pain in female 04/05/2015   GERD (gastroesophageal reflux disease) 04/05/2015   Insomnia,  persistent 11/25/2014   Endometriosis 11/25/2014   Hypertension, benign 11/25/2014   Dysmetabolic syndrome 11/25/2014    Past Surgical History:  Procedure Laterality Date   ABDOMINAL HYSTERECTOMY  05/21/2016   COLONOSCOPY  2019   COLONOSCOPY WITH PROPOFOL  N/A 07/04/2021   Procedure: COLONOSCOPY WITH PROPOFOL ;  Surgeon: Unk Corinn Skiff, MD;  Location[facility] ENDOSCOPY;  Service: Gastroenterology;   Laterality: N/A;   DILATION AND CURETTAGE OF UTERUS     ESOPHAGOGASTRODUODENOSCOPY (EGD) WITH PROPOFOL  N/A 07/04/2021   Procedure: ESOPHAGOGASTRODUODENOSCOPY (EGD) WITH PROPOFOL ;  Surgeon: Unk Corinn Skiff, MD;  Location: [facility] ENDOSCOPY;  Service: Gastroenterology;  Laterality: N/A;   EYE SURGERY  06/28/2013   right laser eye surgery - repair retina   HAND SURGERY Right    HEMORROIDECTOMY  04/04/2018   Dr. Karleen    HYSTEROSCOPY WITH D & C N/A 07/19/2013   Procedure: DILATATION AND CURETTAGE LELDON;  Surgeon: Dickie DELENA Carder, MD;  Location: [facility] ORS;  Service: Gynecology;  Laterality: N/A;   INDUCED ABORTION     LAPAROSCOPY  1992   endometriosis   LAPAROSCOPY N/A 07/19/2013   Procedure: LAPAROSCOPY DIAGNOSTIC  with resection of endometriosis;  Surgeon: Dickie DELENA Carder, MD;  Location: [facility] ORS;  Service: Gynecology;  Laterality: N/A;   metatarsil   2003   left foot surgery    RECTAL SURGERY  08/15/2021    Family History  Problem Relation Age of Onset   Diabetes Mother    Hypertension Mother    Hypertension Father    Kidney disease Father    Stroke Maternal Grandmother    Leukemia Paternal Grandmother    Alzheimer's disease Paternal Grandfather    Heart disease Brother    Anesthesia problems Neg Hx    Hypotension Neg Hx    Malignant hyperthermia Neg Hx    Pseudochol deficiency Neg Hx     Social History   Tobacco Use   Smoking status: Former    Current packs/day: 0.50    Average packs/day: 0.5 packs/day for 30.4 years (15.2 ttl pk-yrs)    Types: Cigarettes    Start date: 11/25/1993   Smokeless tobacco: Never  Substance Use Topics   Alcohol use: No    Alcohol/week: 0.0 standard drinks of alcohol     Current Outpatient Medications:    acetaminophen  (TYLENOL ) 500 MG tablet, Take 500 mg by mouth every 6 (six) hours as needed for mild pain., Disp: , Rfl:    aspirin  81 MG chewable tablet, Chew 1 tablet (81 mg total) by mouth daily., Disp: 90 tablet, Rfl:  0   cyclobenzaprine  (FLEXERIL ) 5 MG tablet, Take 1 tablet (5 mg total) by mouth at bedtime as needed., Disp: 30 tablet, Rfl: 0   methylPREDNISolone  (MEDROL ) 4 MG TBPK tablet, Take as directed per packaging instructions, Disp: 21 tablet, Rfl: 0   metoprolol  succinate (TOPROL -XL) 25 MG 24 hr tablet, Take 1 tablet (25 mg total) by mouth daily., Disp: 90 tablet, Rfl: 1   naloxone  (NARCAN ) nasal spray 4 mg/0.1 mL, In case of emergency (overdose), spray once into each nostril. If no response within 3 minutes, repeat application and call 911., Disp: 1 each, Rfl: 1   Omega-3 Fatty Acids (FISH OIL) 1000 MG CAPS, Take 2,000 mg by mouth daily., Disp: , Rfl:    ondansetron  (ZOFRAN ) 4 MG tablet, Take 1 tablet (4 mg total) by mouth every 8 (eight) hours as needed for nausea or vomiting., Disp: 30 tablet, Rfl: 1   oxyCODONE -acetaminophen  (PERCOCET) 10-325 MG tablet, Take 1  tablet by mouth every 6 (six) hours as needed for pain. Must last 30 days., Disp: 120 tablet, Rfl: 0   [START ON 05/06/2024] oxyCODONE -acetaminophen  (PERCOCET) 10-325 MG tablet, Take 1 tablet by mouth every 6 (six) hours as needed for pain. Must last 30 days., Disp: 120 tablet, Rfl: 0   [START ON 06/05/2024] oxyCODONE -acetaminophen  (PERCOCET) 10-325 MG tablet, Take 1 tablet by mouth every 6 (six) hours as needed for pain. Must last 30 days., Disp: 120 tablet, Rfl: 0   tirzepatide  (ZEPBOUND ) 2.5 MG/0.5ML injection vial, INJECT 0.5 ML (2.5 MG) UNDER THE SKIN ONCE WEEKLY (0.5ML= 50 UNITS), Disp: 2 mL, Rfl: 0   valsartan -hydrochlorothiazide  (DIOVAN -HCT) 80-12.5 MG tablet, Take 1 tablet by mouth daily., Disp: 90 tablet, Rfl: 1  Allergies  Allergen Reactions   Ppd [Tuberculin Purified Protein Derivative]     I personally reviewed active problem list, medication list, allergies with the patient/caregiver today.   ROS  Ten systems reviewed and is negative except as mentioned in HPI    Objective Physical Exam  CONSTITUTIONAL: Patient appears  well-developed and well-nourished.  No distress. HEENT: Head atraumatic, normocephalic, neck supple. CARDIOVASCULAR: Normal rate, regular rhythm and normal heart sounds.  No murmur heard. No BLE edema. PULMONARY: Effort normal and breath sounds normal. No respiratory distress. ABDOMINAL: There is no tenderness or distention. MUSCULOSKELETAL: Normal gait. Without gross motor or sensory deficit. PSYCHIATRIC: Patient has a normal mood and affect. behavior is normal. Judgment and thought content normal.  Vitals:   05/05/24 1331  BP: 112/74  Pulse: 94  Resp: 16  SpO2: 97%  Weight: 166 lb 11.2 oz (75.6 kg)  Height: 5' 5 (1.651 m)    Body mass index is 27.74 kg/m.  Recent Results (from the past 2160 hours)  ToxASSU[employer] Select 13 (MW), Urine     Status: None   Collection Time: 03/23/24 12:00 PM  Result Value Ref Range   Summary FINAL     Comment: ==================================================================== ToxAS[employer] Select 13 (MW) ==================================================================== Test                             Result       Flag       Units  Drug Present and Declared for Prescription Verification   Oxycodone                       >3165        EXPECTED   ng/mg creat   Oxymorphone                    1645         EXPECTED   ng/mg creat   Noroxycodone                   >3165        EXPECTED   ng/mg creat   Noroxymorphone                 >3165        EXPECTED   ng/mg creat    Sources of oxycodone  are scheduled prescription medications.    Oxymorphone, noroxycodone, and noroxymorphone are expected    metabolites of oxycodone . Oxymorphone is also available as a    scheduled prescription medication.  ==================================================================== Test                      Result    Flag  Units      Ref Range   Creatinine              316              mg/dL      >=79 ==  ================================================================== Declared Medications:  The flagging and interpretation on this report are based on the  following declared medications.  Unexpected results may arise from  inaccuracies in the declared medications.   **Note: The testing scope of this panel includes these medications:   Oxycodone  (Percocet)   **Note: The testing scope of this panel does not include the  following reported medications:   Acetaminophen  (Tylenol )  Acetaminophen  (Percocet)  Aspirin   Fish Oil  Gabapentin  (Neurontin )  Hydrochlorothiazide   Metoprolol  (Toprol )  Modafinil  (Provigil )  Naloxone  (Narcan )  Ondansetron  (Zofran )  Promethazine  (Phenergan )  Tirzepatide  (Zepbound )  Valsartan  (Diovan ) ==================================================================== For clinical consultation, please call [phone]. ====================================================================   CBC     Status: None   Collection Time: 04/18/24  2:37 AM  Result Value Ref Range   WBC 6.1 4.0 - 10.5 K/uL   RBC 4.25 3.87 - 5.11 MIL/uL   Hemoglobin 12.2 12.0 - 15.0 g/dL   HCT 63.4 63.9 - 53.9 %   MCV 85.9 80.0 - 100.0 fL   MCH 28.7 26.0 - 34.0 pg   MCHC 33.4 30.0 - 36.0 g/dL   RDW 85.9 88.4 - 84.4 %   Platelets 313 150 - 400 K/uL   nRBC 0.0 0.0 - 0.2 %    Comment: Performed at [facility], [address] [place], [place] [address]  Basic metabolic panel with GFR     Status: Abnormal   Collection Time: 04/18/24  3:19 AM  Result Value Ref Range   Sodium 140 135 - 145 mmol/L   Potassium 3.6 3.5 - 5.1 mmol/L   Chloride 105 98 - 111 mmol/L   CO2 25 22 - 32 mmol/L   Glucose, Bld 98 70 - 99 mg/dL    Comment: Glucose reference range applies only to samples taken after fasting for at least 8 hours.   BUN 8 6 - 20 mg/dL   Creatinine, Ser 9.25 0.44 - 1.00 mg/dL   Calcium 89.5 (H) 8.9 - 10.3 mg/dL   GFR, Estimated >39 >39 mL/min    Comment:  (NOTE) Calculated using the CKD-EPI Creatinine Equation (2021)    Anion gap 10 5 - 15    Comment: Performed at [facility], [address], [place], [place] [address]  D-dimer, quantitative     Status: None   Collection Time: 04/18/24  3:19 AM  Result Value Ref Range   D-Dimer, Quant <0.27 0.00 - 0.50 ug/mL-FEU    Comment: (NOTE) At the manufacturer cut-off value of 0.5 g/mL FEU, this assay has a negative predictive value of 95-100%.This assay is intended for use in conjunction with a clinical pretest probability (PTP) assessment model to exclude pulmonary embolism (PE) and deep venous thrombosis (DVT) in outpatients suspected of PE or DVT. Results should be correlated with clinical presentation. Performed at [facility], [address] [place], [place] [address]   Troponin T, High Sensitivity     Status: None   Collection Time: 04/18/24  3:19 AM  Result Value Ref Range   Troponin T High Sensitivity <15 0 - 19 ng/L    Comment: (NOTE) Biotin concentrations > 1000 ng/mL falsely decrease TnT results.  Serial cardiac troponin measurements are suggested.  Refer to the Links section  for chest pain algorithms and additional  guidance. Performed at [facility], [address] [place], [place] [address]   Troponin T, High Sensitivity     Status: None   Collection Time: 04/18/24  4:31 AM  Result Value Ref Range   Troponin T High Sensitivity <15 0 - 19 ng/L    Comment: (NOTE) Biotin concentrations > 1000 ng/mL falsely decrease TnT results.  Serial cardiac troponin measurements are suggested.  Refer to the Links section for chest pain algorithms and additional  guidance. Performed at [facility], [address], [place], [place] [address]       PHQ2/9:    05/05/2024    1:27 PM 03/23/2024    9:08 AM 01/27/2024    9:55 AM 12/22/2023    8:37 AM 12/13/2023    3:24 PM  Depression screen PHQ 2/9   Decreased Interest 0 0 0 0 0  Down, Depressed, Hopeless 0 0 0 0 0  PHQ - 2 Score 0 0 0 0 0  Altered sleeping 0    0  Tired, decreased energy 0    0  Change in appetite 0    0  Feeling bad or failure about yourself  0    0  Trouble concentrating 0    0  Moving slowly or fidgety/restless 0    0  Suicidal thoughts 0    0  PHQ-9 Score 0    0   Difficult doing work/chores Not difficult at all    Not difficult at all     Data saved with a previous flowsheet row definition    phq 9 is negative  Fall Risk:    05/05/2024    1:27 PM 03/23/2024    9:07 AM 01/27/2024    9:55 AM 12/22/2023    8:37 AM 12/13/2023    3:24 PM  Fall Risk   Falls in the past year? 0 0 0 0 0  Number falls in past yr: 0 0 0  0  Injury with Fall? 0 0  0   0   Risk for fall due to : No Fall Risks    No Fall Risks  Follow up Falls evaluation completed    Falls evaluation completed     Data saved with a previous flowsheet row definition     Assessment & Plan Chronic pain syndrome (fibromyalgia, chronic neck, pelvic, and rectal pain) Chronic pain managed by pain clinic. Fibromyalgia improved with weight loss and medication. Chronic pain managed with oxycodone . Previous rectal pain medication unaffordable. Cyclobenzaprine  prescribed for back injury. - Continue oxycodone  for chronic pain. - Pick up cyclobenzaprine  from [facility]. - Continue pain management with pain clinic.  History of obesity, currently overweight, on anti-obesity pharmacotherapy Significant weight loss from 211 lbs to 166 lbs with Zepbound . BMI now 27.74. Zepbound  well-tolerated, paid out of pocket. - Continue Zepbound  2.5 mg monthly with two refills. - Sent prescription for Zepbound  to [facility].  Essential hypertension Blood pressure controlled at 112/74 mmHg. Weight loss may contribute to control. - Continue valsartan . - Monitor for dizziness or palpitations.  Supraventricular tachycardia Managed with metoprolol . Recent lapse in  medication led to elevated heart rate. - Refilled metoprolol  prescription.  Irritable bowel syndrome with constipation Managed with Miralax. Prefers non-prescription due to cost and dependency concerns. - Continue Miralax.

## 2024-05-06 ENCOUNTER — Other Ambulatory Visit (HOSPITAL_BASED_OUTPATIENT_CLINIC_OR_DEPARTMENT_OTHER): Payer: Self-pay

## 2024-05-18 ENCOUNTER — Other Ambulatory Visit (HOSPITAL_BASED_OUTPATIENT_CLINIC_OR_DEPARTMENT_OTHER): Payer: Self-pay

## 2024-05-18 MED ORDER — PREDNISONE 10 MG (21) PO TBPK
ORAL_TABLET | ORAL | 0 refills | Status: AC
  Filled 2024-05-18: qty 21, 6d supply, fill #0

## 2024-05-30 ENCOUNTER — Other Ambulatory Visit (HOSPITAL_BASED_OUTPATIENT_CLINIC_OR_DEPARTMENT_OTHER): Payer: Self-pay

## 2024-06-05 ENCOUNTER — Other Ambulatory Visit (HOSPITAL_BASED_OUTPATIENT_CLINIC_OR_DEPARTMENT_OTHER): Payer: Self-pay

## 2024-06-11 NOTE — Progress Notes (Unsigned)
 PROVIDER NOTE: Interpretation of information contained herein should be left to medically-trained personnel. Specific patient instructions are provided elsewhere under Patient Instructions section of medical record. This document was created in part using AI and STT-dictation technology, any transcriptional errors that may result from this process are unintentional.  Patient: Stacey Stanley  Service: E/M   PCP: Sowles, Krichna, MD  DOB: 1976-04-01  DOS: 06/13/2024  Provider: Emmy MARLA Blanch, NP  MRN: [id number]  Delivery: Face-to-face  Specialty: Interventional Pain Management  Type: Established Patient  Setting: Ambulatory outpatient facility  Specialty designation: 09  Referring Prov.: Sowles, Krichna, MD  Location: Outpatient office facility       History of present illness (HPI) Stacey Stanley, a 49 y.o. year old female, is here today because of her No primary diagnosis found.. Stacey Stanley's primary complain today is No chief complaint on file.  Pertinent problems: Stacey Stanley has Dysmetabolic syndrome; Chronic pelvic pain in female; GERD (gastroesophageal reflux disease); Right thyroid  nodule; Cervical radiculitis; Kidney stone on left side; Chronic idiopathic constipation; Chronic pain syndrome; Opiate use; Medication management; 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; and GAD (generalized anxiety disorder) on their pertinent problem list.  Pain Assessment: Severity of   is reported as a  /10. Location:    / . Onset:  . Quality:  . Timing:  . Modifying factor(s):  SABRA Vitals:  vitals were not taken for this visit.  BMI: Estimated body mass index is 27.74 kg/m as calculated from the following:   Height as of 05/05/24: 5' 5 (1.651 m).   Weight as of 05/05/24: 166 lb 11.2 oz (75.6 kg).  Last encounter: 03/23/2024. Last procedure: Visit date not found.  Reason for encounter: {Blank single:19197::medication management,post-procedure evaluation and assessment,both, medication  management and post-procedure evaluation and assessment,evaluation of worsening, or previously known (established) problem,patient-requested evaluation,follow-up evaluation,evaluation for possible interventional PM therapy/treatment,evaluation of new problem, *** }.   Discussed the use of AI scribe software for clinical note transcription with the patient, who gave verbal consent to proceed.  History of Present Illness           Pharmacotherapy Assessment   Started Oxycodone -acetaminophen  (Percocet) 10-325 mg tablet every 6 hours as needed for pain. MME=60 Monitoring: [place] PMP: PDMP reviewed during this encounter.       Pharmacotherapy: No side-effects or adverse reactions reported. Compliance: No problems identified. Effectiveness: Clinically acceptable.  Bonner Norris, RN  03/23/2024  9:05 AM  Sign when Signing Visit Nursing Pain Medication Assessment:  Safety precautions to be maintained throughout the outpatient stay will include: orient to surroundings, keep bed in low position, maintain call bell within reach at all times, provide assistance with transfer out of bed and ambulation.   Medication Inspection Compliance: Pill count conducted under aseptic conditions, in front of the patient. Neither the pills nor the bottle was removed from the patient's sight at any time. Once count was completed pills were immediately returned to the patient in their original bottle.  Medication: Oxycodone /APAP Pill/Patch Count: 26 of 120 pills/patches remain Pill/Patch Appearance: Markings consistent with prescribed medication Bottle Appearance: Standard pharmacy container. Clearly labeled. Filled Date: 32 / 07 / 2025 Last Medication intake:  Yesterday   Patient states she forgot to bring her pills at home in her pill box. She states there was 28 pills in her pill box.     UDS:  Summary  Date Value Ref Range Status  01/12/2023 Note  Final    Comment:     ====================================================================  [employer] Select 13 (MW) ==================================================================== Test                             Result       Flag       Units  Drug Present and Declared for Prescription Verification   Tapentadol                      >6579        EXPECTED   ng/mg creat    Source of tapentadol  is a scheduled prescription medication.  ==================================================================== Test                      Result    Flag   Units      Ref Range   Creatinine              152              mg/dL      >=79 ==================================================================== Declared Medications:  The flagging and interpretation on this report are based on the  following declared medications.  Unexpected results may arise from  inaccuracies in the declared medications.   **Note: The testing scope of this panel includes these medications:   Tapentadol  (Nucynta )   **Note: The testing scope of this panel does not include the  following reported medications:   Acetaminophen  (Tylenol )  Aspirin   Buspirone  (Buspar )  Clindamycin  (Cleocin )  Dapsone  Fish Oil  Hydrocortisone  Hydroxyzine  (Vistaril )  Ibuprofen  (Advil )  Imiquimod (Aldara)  Meloxicam  (Mobic )  Methylprednisolone  (Medrol )  Metoprolol  (Toprol )  Mupirocin  (Bactroban )  Naproxen  (Naprosyn )  Omeprazole  (Prilosec)  Plecanatide  (Trulance )  Semaglutide  (Wegovy )  Topical  Tretinoin  Valsartan  (Diovan ) ==================================================================== For clinical consultation, please call [phone]. ====================================================================     No results found for: CBDTHCR No results found for: D8THCCBX No results found for: D9THCCBX  ROS  Constitutional: Denies any fever or chills Gastrointestinal: No reported hemesis, hematochezia, vomiting, or acute GI  distress Musculoskeletal: Pelvic Pain (Radiates from front of pelvic area to the rectum ) Neurological: No reported episodes of acute onset apraxia, aphasia, dysarthria, agnosia, amnesia, paralysis, loss of coordination, or loss of consciousness  Medication Review  Fish Oil, acetaminophen , aspirin , gabapentin , metoprolol  succinate, modafinil , naloxone , ondansetron , oxyCODONE -acetaminophen , promethazine , tirzepatide , and valsartan -hydrochlorothiazide   History Review  Allergy: Stacey Stanley is allergic to ppd [tuberculin purified protein derivative]. Drug: Stacey Stanley  reports no history of drug use. Alcohol:  reports no history of alcohol use. Tobacco:  reports that she has quit smoking. Her smoking use included cigarettes. She started smoking about 30 years ago. She has a 15.2 pack-year smoking history. She has never used smokeless tobacco. Social: Stacey Stanley  reports that she has quit smoking. Her smoking use included cigarettes. She started smoking about 30 years ago. She has a 15.2 pack-year smoking history. She has never used smokeless tobacco. She reports that she does not drink alcohol and does not use drugs. Medical:  has a past medical history of Anxiety, Arthritis, Chest pain of uncertain etiology (05/02/2023), Chronic insomnia, Depression, Diabetes mellitus without complication (HCC), Dysrhythmia, Endometriosis, GERD (gastroesophageal reflux disease), Gout, Headache(784.0), History of kidney stones, Hypertension, Metabolic syndrome, Obesity, Right thyroid  nodule (2017), Serum calcium elevated, Sleep disorder, circadian, shift work type, SVD (spontaneous vaginal delivery), Tachycardia, and TIA (transient ischemic attack). Surgical: Stacey Stanley  has a past surgical history that includes metatarsil  (2003); Dilation and curettage of uterus; Induced  abortion; laparoscopy (1992); Eye surgery (06/28/2013); laparoscopy (N/A, 07/19/2013); Hysteroscopy with D & C (N/A, 07/19/2013); Abdominal  hysterectomy (05/21/2016); Hemorroidectomy (04/04/2018); Colonoscopy (2019); Colonoscopy with propofol  (N/A, 07/04/2021); Esophagogastroduodenoscopy (egd) with propofol  (N/A, 07/04/2021); Rectal surgery (08/15/2021); and Hand surgery (Right). Family: family history includes Alzheimer's disease in her paternal grandfather; Diabetes in her mother; Heart disease in her brother; Hypertension in her father and mother; Kidney disease in her father; Leukemia in her paternal grandmother; Stroke in her maternal grandmother.  Laboratory Chemistry Profile   Renal Lab Results  Component Value Date   BUN 14 05/19/2023   CREATININE 0.87 05/19/2023   BCR SEE NOTE: 08/13/2022   GFRAA 108 11/28/2020   GFRNONAA >60 05/19/2023    Hepatic Lab Results  Component Value Date   AST 11 08/13/2022   ALT 9 08/13/2022   ALBUMIN 4.1 05/17/2022   ALKPHOS 52 05/17/2022   AMYLASE 102 (H) 05/22/2016   LIPASE 13 04/01/2022    Electrolytes Lab Results  Component Value Date   NA 141 05/19/2023   K 3.9 05/19/2023   CL 106 05/19/2023   CALCIUM 9.6 05/19/2023   MG 1.7 12/13/2022    Bone Lab Results  Component Value Date   VD25OH 37 08/13/2022    Inflammation (CRP: Acute Phase) (ESR: Chronic Phase) Lab Results  Component Value Date   CRP 0.2 05/06/2020   ESRSEDRATE 2 05/06/2020         Note: Above Lab results reviewed.  Recent Imaging Review  CT Head Wo Contrast CLINICAL DATA:  Worsening headache for 4 days.  EXAM: CT HEAD WITHOUT CONTRAST  TECHNIQUE: Contiguous axial images were obtained from the base of the skull through the vertex without intravenous contrast.  RADIATION DOSE REDUCTION: This exam was performed according to the departmental dose-optimization program which includes automated exposure control, adjustment of the mA and/or kV according to patient size and/or use of iterative reconstruction technique.  COMPARISON:  05/17/2022  FINDINGS: Brain: There is no evidence for acute  hemorrhage, hydrocephalus, mass lesion, or abnormal extra-axial fluid collection. No definite CT evidence for acute infarction.  Vascular: No hyperdense vessel or unexpected calcification.  Skull: No evidence for fracture. No worrisome lytic or sclerotic lesion.  Sinuses/Orbits: The visualized paranasal sinuses and mastoid air cells are clear. Visualized portions of the globes and intraorbital fat are unremarkable.  Other: None.  IMPRESSION: No acute intracranial abnormality.  Electronically Signed   By: Camellia Candle M.D.   On: 05/19/2023 12:12 Note: Reviewed        Physical Exam  Vitals: BP 108/83 (BP Location: Right Arm, Patient Position: Sitting, Cuff Size: Normal)   Pulse (!) 102   Temp (!) 97.3 F (36.3 C) (Temporal)   Resp 18   Ht 5' 5 (1.651 m)   Wt 177 lb (80.3 kg)   LMP 07/09/2015 (Exact Date)   SpO2 100%   BMI 29.45 kg/m  BMI: Estimated body mass index is 29.45 kg/m as calculated from the following:   Height as of this encounter: 5' 5 (1.651 m).   Weight as of this encounter: 177 lb (80.3 kg). Ideal: Ideal body weight: 57 kg (125 lb 10.6 oz) Adjusted ideal body weight: 66.3 kg (146 lb 3.2 oz) General appearance: Well nourished, well developed, and well hydrated. In no apparent acute distress Mental status: Alert, oriented x 3 (person, place, & time)       Respiratory: No evidence of acute respiratory distress Eyes: PERLA  Musculoskeletal: Pelvic Pain (Radiates from front of pelvic  area to the rectum ) Assessment   Diagnosis Status  1. Chronic pain syndrome   2. Medication management   3. Chronic pelvic pain in female   4. Long term prescription benzodiazepine use   5. Endometriosis   6. Cervical radiculitis    Controlled Controlled Controlled   Updated Problems: No problems updated.  Plan of Care  Problem-specific:  Assessment and Plan    Monitoring: [place] PMP: PDMP not reviewed this encounter. {Blank single:19197::Unable to conduct  review of the controlled substance reporting system due to technological failure.,     } Pharmacotherapy: {Blank single:19197::Opioid-induced constipation (OIC)(K59.03, T40.2X5A),No side-effects or adverse reactions reported.} Compliance: {Blank single:19197::Medication agreement violation - unsanctioned acquisition/use of additional opioid-containing medication,No problems identified.} Effectiveness: {Blank single:19197::Clinically acceptable.}  No notes on file  UDS:  Summary  Date Value Ref Range Status  03/23/2024 FINAL  Final    Comment:    ==================================================================== [employer] Select 13 (MW) ==================================================================== Test                             Result       Flag       Units  Drug Present and Declared for Prescription Verification   Oxycodone                       >3165        EXPECTED   ng/mg creat   Oxymorphone                    1645         EXPECTED   ng/mg creat   Noroxycodone                   >3165        EXPECTED   ng/mg creat   Noroxymorphone                 >3165        EXPECTED   ng/mg creat    Sources of oxycodone  are scheduled prescription medications.    Oxymorphone, noroxycodone, and noroxymorphone are expected    metabolites of oxycodone . Oxymorphone is also available as a    scheduled prescription medication.  ==================================================================== Test                      Result    Flag   Units      Ref Range   Creatinine              316              mg/dL      >=79 ==================================================================== Declared Medications:  The flagging and interpretation on this report are based on the  following declared medications.  Unexpected results may arise from  inaccuracies in the declared medications.   **Note: The testing scope of this panel includes these medications:   Oxycodone  (Percocet)    **Note: The testing scope of this panel does not include the  following reported medications:   Acetaminophen  (Tylenol )  Acetaminophen  (Percocet)  Aspirin   Fish Oil  Gabapentin  (Neurontin )  Hydrochlorothiazide   Metoprolol  (Toprol )  Modafinil  (Provigil )  Naloxone  (Narcan )  Ondansetron  (Zofran )  Promethazine  (Phenergan )  Tirzepatide  (Zepbound )  Valsartan  (Diovan ) ==================================================================== For clinical consultation, please call [phone]. ====================================================================     No results found for: CBDTHCR No results found for: D8THCCBX No results found for: D9THCCBX  ROS  Constitutional: {Blank single:19197::Denies any fever or chills} Gastrointestinal: {Blank single:19197::No reported hemesis, hematochezia, vomiting, or acute GI distress} Musculoskeletal: {Blank single:19197::Denies any acute onset joint swelling, redness, loss of ROM, or weakness} Neurological: {Blank single:19197::No reported episodes of acute onset apraxia, aphasia, dysarthria, agnosia, amnesia, paralysis, loss of coordination, or loss of consciousness}  Medication Review  Fish Oil, acetaminophen , aspirin , cyclobenzaprine , methylPREDNISolone , metoprolol  succinate, naloxone , ondansetron , oxyCODONE -acetaminophen , predniSONE , tirzepatide , and valsartan -hydrochlorothiazide   History Review  Allergy: Stacey Stanley is allergic to ppd [tuberculin purified protein derivative]. Drug: Stacey Stanley  reports no history of drug use. Alcohol:  reports no history of alcohol use. Tobacco:  reports that she has quit smoking. Her smoking use included cigarettes. She started smoking about 30 years ago. She has a 15.3 pack-year smoking history. She has never used smokeless tobacco. Social: Stacey Stanley  reports that she has quit smoking. Her smoking use included cigarettes. She started smoking about 30 years ago. She has a 15.3  pack-year smoking history. She has never used smokeless tobacco. She reports that she does not drink alcohol and does not use drugs. Medical:  has a past medical history of Anxiety, Arthritis, Chest pain of uncertain etiology (05/02/2023), Chronic insomnia, Depression, Diabetes mellitus without complication (HCC), Dysrhythmia, Endometriosis, GERD (gastroesophageal reflux disease), Gout, Headache(784.0), History of kidney stones, Hypertension, Metabolic syndrome, Obesity, Right thyroid  nodule (2017), Serum calcium elevated, Sleep disorder, circadian, shift work type, SVD (spontaneous vaginal delivery), Tachycardia, and TIA (transient ischemic attack). Surgical: Stacey Stanley  has a past surgical history that includes metatarsil  (2003); Dilation and curettage of uterus; Induced abortion; laparoscopy (1992); Eye surgery (06/28/2013); laparoscopy (N/A, 07/19/2013); Hysteroscopy with D & C (N/A, 07/19/2013); Abdominal hysterectomy (05/21/2016); Hemorroidectomy (04/04/2018); Colonoscopy (2019); Colonoscopy with propofol  (N/A, 07/04/2021); Esophagogastroduodenoscopy (egd) with propofol  (N/A, 07/04/2021); Rectal surgery (08/15/2021); and Hand surgery (Right). Family: family history includes Alzheimer's disease in her paternal grandfather; Diabetes in her mother; Heart disease in her brother; Hypertension in her father and mother; Kidney disease in her father; Leukemia in her paternal grandmother; Stroke in her maternal grandmother.  Laboratory Chemistry Profile   Renal Lab Results  Component Value Date   BUN 8 04/18/2024   CREATININE 0.74 04/18/2024   BCR SEE NOTE: 08/13/2022   GFRAA 108 11/28/2020   GFRNONAA >60 04/18/2024    Hepatic Lab Results  Component Value Date   AST 11 08/13/2022   ALT 9 08/13/2022   ALBUMIN 4.1 05/17/2022   ALKPHOS 52 05/17/2022   AMYLASE 102 (H) 05/22/2016   LIPASE 13 04/01/2022    Electrolytes Lab Results  Component Value Date   NA 140 04/18/2024   K 3.6 04/18/2024    CL 105 04/18/2024   CALCIUM 10.4 (H) 04/18/2024   MG 1.7 12/13/2022    Bone Lab Results  Component Value Date   VD25OH 37 08/13/2022    Inflammation (CRP: Acute Phase) (ESR: Chronic Phase) Lab Results  Component Value Date   CRP 0.2 05/06/2020   ESRSEDRATE 2 05/06/2020         Note: {Blank single:19197::Stacey Stanley indicates labs done and monitored by primary care practitioner using a non-[facility] EMR system,No results found under the [facility] electronic medical record,Patient noncompliant with Lab Work orders.,Results made available to patient.,Lab results reviewed and made available to patient.,Lab results reviewed and explained to patient in Layman's terms.,Above Lab results reviewed.}  Recent Imaging Review  DG Chest 2 View EXAM: 2 VIEW(S) XRAY OF THE CHEST 04/18/2024 02:51:29 AM  COMPARISON: Chest radiograph 12/31/2022  CLINICAL HISTORY: Chest  pain  FINDINGS:  LUNGS AND PLEURA: No focal pulmonary opacity. No pleural effusion. No pneumothorax.  HEART AND MEDIASTINUM: No acute abnormality of the cardiac and mediastinal silhouettes.  BONES AND SOFT TISSUES: No acute osseous abnormality.  IMPRESSION: 1. No acute process.  Electronically signed by: Gilmore Molt MD 04/18/2024 03:17 AM EST RP Workstation: [id number] Note: {Blank single:19197::No new results found.,No results found under the [facility] electronic medical record.,Imaging results reviewed and explained to patient in Layman's terms.,Results of ordered imaging test(s) reviewed and explained to patient in Layman's terms.,Imaging results reviewed.,Reviewed} {Blank single:19197::Results visible under [facility].,Results visible under [facility].,Results visible under [facility].,Results visible under [facility].,Results visible under Care Everywhere.,Results made available to patient.,Copy of results provided to patient.,Patient noncompliant with diagnostic imaging  orders.,     }  Physical Exam  Vitals: LMP 07/09/2015  BMI: Estimated body mass index is 27.74 kg/m as calculated from the following:   Height as of 05/05/24: 5' 5 (1.651 m).   Weight as of 05/05/24: 166 lb 11.2 oz (75.6 kg). Ideal: Patient weight not recorded General appearance: {general exam:210120802::Well nourished, well developed, and well hydrated. In no apparent acute distress} Mental status: {Blank single:19197::Alert and oriented x 3. Exaggerated physical and/or psychosocial pain behavior perceived.,Alert, oriented x 3 (person, place, & time)} {Blank single:19197::Stacey Stanley's speech pattern and demeanor seems to suggest oversedation,     } Respiratory: {Blank single:19197::Oxygen-dependent COPD,No evidence of acute respiratory distress} Eyes: {Blank single:19197::Miotic (pupilary constriction) due to opiate use,Midriatic,Anisocoric,Evidence of ptosis,Pin-point pupils,PERLA}   Assessment   Diagnosis Status  No diagnosis found. {Blank single:19197::Absent,Deteriorating,Having a Flare-up,Improved,Improving,Not improving,Not responding,Persistent,Present,Recurring,Reoccurring,Resolved,Responding,Stable,Unchanged,improved,Worsened,Worsening,Controlled} {Blank single:19197::Absent,Deteriorating,Having a Flare-up,Improved,Improving,Not improving,Not responding,Persistent,Present,Recurring,Reoccurring,Resolved,Responding,Stable,Unchanged,improved,Worsened,Worsening,Controlled} {Blank single:19197::Absent,Deteriorating,Having a Flare-up,Improved,Improving,Not improving,Not responding,Persistent,Present,Recurring,Reoccurring,Resolved,Responding,Stable,Unchanged,improved,Worsened,Worsening,Controlled}   Updated Problems: No problems updated.  Plan of Care  Problem-specific:  Assessment and Plan            Stacey Stanley has a current  medication list which includes the following long-term medication(s): metoprolol  succinate and valsartan -hydrochlorothiazide .  Pharmacotherapy (Medications Ordered): No orders of the defined types were placed in this encounter.  Orders:  No orders of the defined types were placed in this encounter.    {There is no content from the last Plan section.}   No follow-ups on file.    Recent Visits Date Type Provider Dept  03/23/24 Office Visit Crosby Bevan K, NP [facility]  Showing recent visits within past 90 days and meeting all other requirements Future Appointments Date Type Provider Dept  06/13/24 Appointment Deondrick Searls K, NP [facility]  Showing future appointments within next 90 days and meeting all other requirements  I discussed the assessment and treatment plan with the patient. The patient was provided an opportunity to ask questions and all were answered. The patient agreed with the plan and demonstrated an understanding of the instructions.  Patient advised to call back or seek an in-person evaluation if the symptoms or condition worsens.  Duration of encounter: *** minutes.  Total time on encounter, as per AMA guidelines included both the face-to-face and non-face-to-face time personally spent by the physician and/or other qualified health care professional(s) on the day of the encounter (includes time in activities that require the physician or other qualified health care professional and does not include time in activities normally performed by clinical staff). Physician's time may include the following activities when performed: Preparing to see the patient (e.g., pre-charting review of records, searching for previously ordered imaging, lab work, and nerve conduction tests) Review of prior analgesic pharmacotherapies.  Reviewing PMP Interpreting ordered tests (e.g., lab work, imaging, nerve conduction tests) Performing post-procedure evaluations,  including interpretation of diagnostic procedures Obtaining and/or reviewing separately obtained history Performing a medically appropriate examination and/or evaluation Counseling and educating the patient/family/caregiver Ordering medications, tests, or procedures Referring and communicating with other health care professionals (when not separately reported) Documenting clinical information in the electronic or other health record Independently interpreting results (not separately reported) and communicating results to the patient/ family/caregiver Care coordination (not separately reported)  Note by: Carolos Fecher K Dorrie Cocuzza, NP (TTS and AI technology used. I apologize for any typographical errors that were not detected and corrected.) Date: 06/13/2024; Time: 8:01 PM

## 2024-06-13 ENCOUNTER — Telehealth: Payer: Self-pay | Admitting: Student in an Organized Health Care Education/Training Program

## 2024-06-13 ENCOUNTER — Ambulatory Visit: Payer: Self-pay | Attending: Nurse Practitioner | Admitting: Nurse Practitioner

## 2024-06-13 ENCOUNTER — Other Ambulatory Visit (HOSPITAL_BASED_OUTPATIENT_CLINIC_OR_DEPARTMENT_OTHER): Payer: Self-pay

## 2024-06-13 ENCOUNTER — Encounter: Payer: Self-pay | Admitting: Nurse Practitioner

## 2024-06-13 ENCOUNTER — Other Ambulatory Visit: Payer: Self-pay | Admitting: *Deleted

## 2024-06-13 VITALS — BP 140/99 | HR 82 | Temp 98.8°F | Resp 16 | Ht 65.0 in | Wt 160.0 lb

## 2024-06-13 DIAGNOSIS — M5412 Radiculopathy, cervical region: Secondary | ICD-10-CM

## 2024-06-13 DIAGNOSIS — G894 Chronic pain syndrome: Secondary | ICD-10-CM

## 2024-06-13 DIAGNOSIS — Z79899 Other long term (current) drug therapy: Secondary | ICD-10-CM

## 2024-06-13 DIAGNOSIS — G8929 Other chronic pain: Secondary | ICD-10-CM

## 2024-06-13 DIAGNOSIS — N809 Endometriosis, unspecified: Secondary | ICD-10-CM | POA: Insufficient documentation

## 2024-06-13 DIAGNOSIS — R102 Pelvic and perineal pain unspecified side: Secondary | ICD-10-CM | POA: Insufficient documentation

## 2024-06-13 MED ORDER — OXYCODONE HCL 5 MG PO TABS
5.0000 mg | ORAL_TABLET | Freq: Three times a day (TID) | ORAL | 0 refills | Status: AC | PRN
  Filled 2024-06-13: qty 90, 30d supply, fill #0

## 2024-06-13 MED ORDER — OXYCODONE HCL 5 MG PO TABS: 5.0000 mg | ORAL_TABLET | Freq: Three times a day (TID) | ORAL | 0 refills | Status: DC | PRN

## 2024-06-13 NOTE — Progress Notes (Signed)
 Nursing Pain Medication Assessment:  Safety precautions to be maintained throughout the outpatient stay will include: orient to surroundings, keep bed in low position, maintain call bell within reach at all times, provide assistance with transfer out of bed and ambulation.  Medication Inspection Compliance: Pill count conducted under aseptic conditions, in front of the patient. Neither the pills nor the bottle was removed from the patient's sight at any time. Once count was completed pills were immediately returned to the patient in their original bottle.  Medication: Oxycodone /APAP Pill/Patch Count: 95.5 of 120 pills/patches remain Pill/Patch Appearance: Markings consistent with prescribed medication Bottle Appearance: Standard pharmacy container. Clearly labeled. Filled Date: 01 / 05 / 2026 Last Medication intake:  Friday

## 2024-06-13 NOTE — Telephone Encounter (Signed)
 Pt stated that she cannot fill her prescription for today until feb 4th it is wrong.

## 2024-06-13 NOTE — Patient Instructions (Signed)

## 2024-06-13 NOTE — Addendum Note (Signed)
 Addended by: Yumna Ebers on: 06/13/2024 03:38 PM   Modules accepted: Orders

## 2024-06-14 ENCOUNTER — Other Ambulatory Visit (HOSPITAL_BASED_OUTPATIENT_CLINIC_OR_DEPARTMENT_OTHER): Payer: Self-pay

## 2024-06-23 ENCOUNTER — Other Ambulatory Visit (HOSPITAL_BASED_OUTPATIENT_CLINIC_OR_DEPARTMENT_OTHER): Payer: Self-pay

## 2024-06-24 ENCOUNTER — Other Ambulatory Visit (HOSPITAL_BASED_OUTPATIENT_CLINIC_OR_DEPARTMENT_OTHER): Payer: Self-pay

## 2024-06-24 MED ORDER — NITROFURANTOIN MONOHYD MACRO 100 MG PO CAPS
100.0000 mg | ORAL_CAPSULE | Freq: Two times a day (BID) | ORAL | 0 refills | Status: AC
  Filled 2024-06-24: qty 10, 5d supply, fill #0

## 2024-06-28 ENCOUNTER — Other Ambulatory Visit (HOSPITAL_BASED_OUTPATIENT_CLINIC_OR_DEPARTMENT_OTHER): Payer: Self-pay

## 2024-06-29 ENCOUNTER — Emergency Department (HOSPITAL_BASED_OUTPATIENT_CLINIC_OR_DEPARTMENT_OTHER)
Admission: EM | Admit: 2024-06-29 | Discharge: 2024-06-29 | Disposition: A | Discharge status: Home / Self Care | Payer: Self-pay

## 2024-06-29 ENCOUNTER — Other Ambulatory Visit (HOSPITAL_BASED_OUTPATIENT_CLINIC_OR_DEPARTMENT_OTHER): Payer: Self-pay

## 2024-06-29 ENCOUNTER — Other Ambulatory Visit: Payer: Self-pay

## 2024-06-29 ENCOUNTER — Emergency Department (HOSPITAL_BASED_OUTPATIENT_CLINIC_OR_DEPARTMENT_OTHER): Payer: Self-pay

## 2024-06-29 ENCOUNTER — Encounter (HOSPITAL_BASED_OUTPATIENT_CLINIC_OR_DEPARTMENT_OTHER): Payer: Self-pay | Admitting: Emergency Medicine

## 2024-06-29 DIAGNOSIS — K5904 Chronic idiopathic constipation: Secondary | ICD-10-CM | POA: Insufficient documentation

## 2024-06-29 DIAGNOSIS — Z7982 Long term (current) use of aspirin: Secondary | ICD-10-CM | POA: Insufficient documentation

## 2024-06-29 DIAGNOSIS — R300 Dysuria: Secondary | ICD-10-CM | POA: Insufficient documentation

## 2024-06-29 LAB — URINALYSIS, W/ REFLEX TO CULTURE (INFECTION SUSPECTED)
Bacteria, UA: NONE SEEN
Bilirubin Urine: NEGATIVE
Glucose, UA: NEGATIVE mg/dL
Hgb urine dipstick: NEGATIVE
Ketones, ur: NEGATIVE mg/dL
Nitrite: NEGATIVE
Specific Gravity, Urine: 1.027 (ref 1.005–1.030)
pH: 5.5 (ref 5.0–8.0)

## 2024-06-29 LAB — CBC
HCT: 40.1 % (ref 36.0–46.0)
Hemoglobin: 13.4 g/dL (ref 12.0–15.0)
MCH: 29.6 pg (ref 26.0–34.0)
MCHC: 33.4 g/dL (ref 30.0–36.0)
MCV: 88.5 fL (ref 80.0–100.0)
Platelets: 289 10*3/uL (ref 150–400)
RBC: 4.53 MIL/uL (ref 3.87–5.11)
RDW: 12.9 % (ref 11.5–15.5)
WBC: 7.2 10*3/uL (ref 4.0–10.5)
nRBC: 0 % (ref 0.0–0.2)

## 2024-06-29 LAB — BASIC METABOLIC PANEL WITH GFR
Anion gap: 11 (ref 5–15)
BUN: 9 mg/dL (ref 6–20)
CO2: 25 mmol/L (ref 22–32)
Calcium: 10.9 mg/dL — ABNORMAL HIGH (ref 8.9–10.3)
Chloride: 103 mmol/L (ref 98–111)
Creatinine, Ser: 0.78 mg/dL (ref 0.44–1.00)
GFR, Estimated: >60 mL/min
Glucose, Bld: 106 mg/dL — ABNORMAL HIGH (ref 70–99)
Potassium: 4 mmol/L (ref 3.5–5.1)
Sodium: 140 mmol/L (ref 135–145)

## 2024-06-29 MED ORDER — IOHEXOL 300 MG/ML  SOLN
100.0000 mL | Freq: Once | INTRAMUSCULAR | Status: AC | PRN
  Administered 2024-06-29: 100 mL via INTRAVENOUS

## 2024-06-29 MED ORDER — SODIUM CHLORIDE 0.9 % IV BOLUS
1000.0000 mL | Freq: Once | INTRAVENOUS | Status: AC
  Administered 2024-06-29: 1000 mL via INTRAVENOUS

## 2024-06-29 MED ORDER — CEPHALEXIN 500 MG PO CAPS
500.0000 mg | ORAL_CAPSULE | Freq: Two times a day (BID) | ORAL | 0 refills | Status: AC
  Filled 2024-06-29: qty 14, 7d supply, fill #0

## 2024-06-29 NOTE — ED Provider Notes (Signed)
 " [facility] AT [facility] Provider Note   CSN: [id number] Arrival date & time: 06/29/24  9187     Patient presents with: Flank Pain   Stacey Stanley is a 49 y.o. female.   HPI  Patient presents with dysuria and concern for UTI.  States that she started with some foul-smelling urine and maybe some slight dysuria earlier the week.  Was started on Macrobid .  Completed Macrobid  but is not improving symptoms.  Still endorsing polyuria.  Endorses some flank pain on the left side as well.  A couple episodes of nausea but no vomiting.  No diarrhea.  Bowel moods been normal.  Max temperature was 100.2 F.  She has not had a UTI in a long time.  No abnormal vaginal discharge.  No history of nephrolithiasis.  Otherwise, tolerating p.o.  No other complaints.  No chest pain shortness of breath.    Previous medical history reviewed : Patient last seen in the ED in November 2025.  Chest pain at that time.  Unremarkable workup.     Prior to Admission medications  Medication Sig Start Date End Date Taking? Authorizing Provider  cephALEXin  (KEFLEX ) 500 MG capsule Take 1 capsule (500 mg total) by mouth 2 (two) times daily for 7 days. 06/29/24 07/06/24 Yes Simon Lavonia SAILOR, MD  aspirin  81 MG chewable tablet Chew 1 tablet (81 mg total) by mouth daily. 12/12/21   Hildegard Loge, PA-C  metoprolol  succinate (TOPROL -XL) 25 MG 24 hr tablet Take 1 tablet (25 mg total) by mouth daily. 05/05/24   Sowles, Krichna, MD  naloxone  (NARCAN ) nasal spray 4 mg/0.1 mL In case of emergency (overdose), spray once into each nostril. If no response within 3 minutes, repeat application and call 911. 12/22/23 12/21/24  Patel, Seema K, NP  nitrofurantoin , macrocrystal-monohydrate, (MACROBID ) 100 MG capsule Take 1 capsule (100 mg total) by mouth every 12 (twelve) hours for 5 days 06/24/24     Omega-3 Fatty Acids (FISH OIL) 1000 MG CAPS Take 2,000 mg by mouth daily.    [provider]  ondansetron  (ZOFRAN )  4 MG tablet Take 1 tablet (4 mg total) by mouth every 8 (eight) hours as needed for nausea or vomiting. 06/16/23   [place], PA-C  oxyCODONE  (OXY IR/ROXICODONE ) 5 MG immediate release tablet Take 1 tablet (5 mg total) by mouth every 8 (eight) hours as needed for severe pain (pain score 7-10). Must last 30 days. 06/13/24 07/14/24  Patel, Seema K, NP  predniSONE  (STERAPRED UNI-PAK 21 TAB) 10 MG (21) TBPK tablet Take 1 dose pk by oral route as directed for 6 days. 05/18/24     tirzepatide  (ZEPBOUND ) 2.5 MG/0.5ML injection vial Inject 2.5 mg into the skin once a week. 05/05/24   Sowles, Krichna, MD  valsartan -hydrochlorothiazide  (DIOVAN -HCT) 80-12.5 MG tablet Take 1 tablet by mouth daily. 02/04/24   Sowles, Krichna, MD    Allergies: Ppd [tuberculin purified protein derivative]    Review of Systems  Constitutional:  Negative for chills and fever.  HENT:  Negative for ear pain and sore throat.   Eyes:  Negative for pain and visual disturbance.  Respiratory:  Negative for cough and shortness of breath.   Cardiovascular:  Negative for chest pain and palpitations.  Gastrointestinal:  Negative for abdominal pain and vomiting.  Genitourinary:  Negative for dysuria and hematuria.  Musculoskeletal:  Negative for arthralgias and back pain.  Skin:  Negative for color change and rash.  Neurological:  Negative for seizures and syncope.  All other systems reviewed and are negative.   Updated Vital Signs BP (!) 137/92   Pulse 92   Temp 98.2 F (36.8 C) (Oral)   Resp 16   Ht 5' 5 (1.651 m)   Wt 68 kg   LMP 07/09/2015   SpO2 100%   BMI 24.96 kg/m   Physical Exam Vitals and nursing note reviewed.  Constitutional:      General: She is not in acute distress.    Appearance: She is well-developed.  HENT:     Head: Normocephalic and atraumatic.  Eyes:     Conjunctiva/sclera: Conjunctivae normal.  Cardiovascular:     Rate and Rhythm: Normal rate and regular rhythm.     Heart sounds: No murmur  heard. Pulmonary:     Effort: Pulmonary effort is normal. No respiratory distress.     Breath sounds: Normal breath sounds.  Abdominal:     Palpations: Abdomen is soft.     Tenderness: There is no abdominal tenderness.   Musculoskeletal:        General: No swelling.     Cervical back: Neck supple.  Skin:    General: Skin is warm and dry.     Capillary Refill: Capillary refill takes less than 2 seconds.  Neurological:     Mental Status: She is alert.  Psychiatric:        Mood and Affect: Mood normal.     (all labs ordered are listed, but only abnormal results are displayed) Labs Reviewed  BASIC METABOLIC PANEL WITH GFR - Abnormal; Notable for the following components:      Result Value   Glucose, Bld 106 (*)    Calcium 10.9 (*)    All other components within normal limits  URINALYSIS, W/ REFLEX TO CULTURE (INFECTION SUSPECTED) - Abnormal; Notable for the following components:   Protein, ur TRACE (*)    Leukocytes,Ua TRACE (*)    All other components within normal limits  CBC    EKG: None  Radiology: CT ABDOMEN PELVIS W CONTRAST Result Date: 06/29/2024 EXAM: CT ABDOMEN AND PELVIS WITH CONTRAST 06/29/2024 09:27:00 AM TECHNIQUE: CT of the abdomen and pelvis was performed with the administration of 100 mL iohexol  (OMNIPAQUE ) 300 mg/mL solution. Multiplanar reformatted images are provided for review. Automated exposure control, iterative reconstruction, and/or weight-based adjustment of the mA/kV was utilized to reduce the radiation dose to as low as reasonably achievable. COMPARISON: 04/01/2022. MRI of 05/20/2021. CLINICAL HISTORY: Left flank pain. Evaluate for nephrolithiasis. Less likely to be diverticulitis. FINDINGS: LOWER CHEST: No acute abnormality. LIVER: The liver is unremarkable. GALLBLADDER AND BILE DUCTS: Nonvisualization of the gallbladder, presumed cholecystectomy. Common bile duct 0.7 cm in diameter, possibly a physiologic response to cholecystectomy. SPLEEN: 1.3 x  1.3 cm hypodensilation of the medial spleen on image 19 series 2, delayed enhancement on image 10 series 6, with suspicion for a potential delayed enhancing lesion on the MRI of 05/20/2021 in this vicinity measuring 1.0 cm in diameter. The lesion is technically nonspecific although a hemangioma is favored. PANCREAS: No acute abnormality. ADRENAL GLANDS: No acute abnormality. KIDNEYS, URETERS AND BLADDER: 3 mm nonobstructive left kidney upper pole renal calculus. No stones in the ureters. No hydronephrosis. No perinephric or periureteral stranding. Nondistended urinary bladder. GI AND BOWEL: Stomach demonstrates no acute abnormality. There is no bowel obstruction. Prominence of stool throughout the colon, favoring constipation. Normal appendix. Local wall thickening in the proximal colon near the ileocecal valve, probably peristaltic, less likely to be from colon cancer, image  47 series 4. The patient had colonoscopy 07/04/2021 revealing only benign hyperplastic polyps; correlate with colon cancer screening history since that time indeterminate whether further workup of this appearance in the proximal ascending colon is indicated. PERITONEUM AND RETROPERITONEUM: No ascites. No free air. VASCULATURE: Aorta is normal in caliber. LYMPH NODES: No lymphadenopathy. REPRODUCTIVE ORGANS: Prior hysterectomy. BONES AND SOFT TISSUES: No acute osseous abnormality. No focal soft tissue abnormality. IMPRESSION: 1. 3 mm nonobstructive left upper pole renal calculus. 2. Focal wall thickening in the proximal ascending colon near the ileocecal valve, possibly peristalsis; consider further evaluation as clinically appropriate to exclude neoplasm. 3. Prominence of stool throughout the colon, favoring constipation. Electronically signed by: Ryan Salvage MD 06/29/2024 10:52 AM EST RP Workstation: [id number]     Procedures   Medications Ordered in the ED  sodium chloride  0.9 % bolus 1,000 mL ( Intravenous Stopped 06/29/24 1002)   iohexol  (OMNIPAQUE ) 300 MG/ML solution 100 mL (100 mLs Intravenous Contrast Given 06/29/24 9078)                                    Medical Decision Making Amount and/or Complexity of Data Reviewed Labs: ordered. Radiology: ordered.  Risk Prescription drug management.     HPI:   Patient presents with dysuria and concern for UTI.  States that she started with some foul-smelling urine and maybe some slight dysuria earlier the week.  Was started on Macrobid .  Completed Macrobid  but is not improving symptoms.  Still endorsing polyuria.  Endorses some flank pain on the left side as well.  A couple episodes of nausea but no vomiting.  No diarrhea.  Bowel moods been normal.  Max temperature was 100.2 F.  She has not had a UTI in a long time.  No abnormal vaginal discharge.  No history of nephrolithiasis.  Otherwise, tolerating p.o.  No other complaints.  No chest pain shortness of breath.    Previous medical history reviewed : Patient last seen in the ED in November 2025.  Chest pain at that time.  Unremarkable workup.  MDM:   Upon examination, patient hemodynamically stable. A&O x 3 with GCS 15.   Patient has mild pain to palpation in the generalized lower abdominal area.  Most likely diagnosis is UTI.  Less concern for pyelonephritis or nephrolithiasis.  No concerns for diverticulitis.  No concerns for PID.   Will obtain laboratory workup.  UA to rule out UTI.  Liter of fluid.  Reevaluation:   Upon reexamination, patient hemodynamically stable.  Remains A&O x 3 with GCS 15.   UA largely unremarkable.  Trace leuk esterase.  Did have some white blood cells.  Could be more irritation.  Did call in small dose of Keflex  at home.  But she will follow-up urology given that there could be other causes of this cystitis.  She also has a kidney stone in her left upper kidney that is not causing pain but she can follow-up with urology for this as well.  Patient also notable to have some  thickening along ascending colon.  Discussed this with the patient.  She does have history of rectal cancer.  Treated back in 2023.  She has a gastroenterologist.  She states that she will follow back up with gastroenterology for colonoscopy.  Stressed the importance of this.  Constipation.  Recommended MiraLAX as well as Dulcolax.  High-fiber diet.  Otherwise, workup unremarkable.  Discharged stable condition.  I have independently interpreted the  CT  images and agree with the radiologist finding     Disposition and Follow Up: GI       Final diagnoses:  Dysuria  Chronic idiopathic constipation    ED Discharge Orders          Ordered    cephALEXin  (KEFLEX ) 500 MG capsule  2 times daily        06/29/24 1107               Simon Lavonia SAILOR, MD 06/29/24 1115  "

## 2024-06-29 NOTE — ED Triage Notes (Addendum)
 Pt caox4 ambulatory NAD reporting she was tx for UTI approx 5 days ago but s/s have worsened, has had fever and L flank pain. Completed prescribed Macrobid . Afebrile at present, took Tylenol /Ibuprofen  duo at 0600.

## 2024-06-29 NOTE — Discharge Instructions (Addendum)
" ° °  Your CT is as follows:  IMPRESSION:  1. 3 mm nonobstructive left upper pole renal calculus.  2. Focal wall thickening in the proximal ascending colon near the ileocecal  valve, possibly peristalsis; consider further evaluation as clinically  appropriate to exclude neoplasm.  3. Prominence of stool throughout the colon, favoring constipation.   SPLEEN:  1.3 x 1.3 cm hypodensilation of the medial spleen on image 19 series 2, delayed  enhancement on image 10 series 6, with suspicion for a potential delayed  enhancing lesion on the MRI of 05/20/2021 in this vicinity measuring 1.0 cm in  diameter. The lesion is technically nonspecific although a hemangioma is  favored.    In terms of the kidney stone that is in your left kidney.  There is nothing to do for this.  If you notice a lot of flank pain as well as blood in your urine this might be indication that it drop into ureter.  At that point time, follow-up here in the ED.  You can follow-up with urology given your dysuria as well as polyuria.  This can sometimes happen for other reasons other than urinary tract infection.  You do have some thickening upon part of your bowel.  This could be a normal artifact from CT scan.  Nevertheless, I definitively think you should follow-up with gastroenterology.  They will want to perform a colonoscopy.  Is important that you do this.  We need to rule out cancer though I think it is unlikely.  It is something that needs to be done outpatient.  Please give them a call to establish care. 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    In regards to your constipation: Please take MiraLAX daily.  You can also take Dulcolax.  Stay well-hydrated.  Eat plenty of fiber.   "

## 2024-07-10 ENCOUNTER — Ambulatory Visit: Payer: Self-pay | Admitting: Nurse Practitioner

## 2024-08-04 ENCOUNTER — Ambulatory Visit: Admitting: Family Medicine
